# Patient Record
Sex: Female | Born: 1980 | Race: White | Hispanic: No | Smoking: Former smoker
Health system: Southern US, Community
[De-identification: ages and names within clinical notes are randomized; demographics above are authoritative.]

## PROBLEM LIST (undated history)

## (undated) DIAGNOSIS — M25559 Pain in unspecified hip: Secondary | ICD-10-CM

## (undated) DIAGNOSIS — M7070 Other bursitis of hip, unspecified hip: Secondary | ICD-10-CM

## (undated) DIAGNOSIS — F419 Anxiety disorder, unspecified: Secondary | ICD-10-CM

## (undated) DIAGNOSIS — F319 Bipolar disorder, unspecified: Secondary | ICD-10-CM

## (undated) DIAGNOSIS — R579 Shock, unspecified: Secondary | ICD-10-CM

## (undated) DIAGNOSIS — R569 Unspecified convulsions: Secondary | ICD-10-CM

## (undated) HISTORY — PX: KNEE ARTHROSCOPY: SUR90

---

## 2002-01-09 ENCOUNTER — Emergency Department (HOSPITAL_COMMUNITY): Admission: EM | Admit: 2002-01-09 | Discharge: 2002-01-09 | Payer: Self-pay | Admitting: Emergency Medicine

## 2002-01-12 ENCOUNTER — Inpatient Hospital Stay (HOSPITAL_COMMUNITY): Admission: AD | Admit: 2002-01-12 | Discharge: 2002-01-12 | Payer: Self-pay | Admitting: *Deleted

## 2003-03-27 ENCOUNTER — Emergency Department (HOSPITAL_COMMUNITY): Admission: EM | Admit: 2003-03-27 | Discharge: 2003-03-28 | Payer: Self-pay | Admitting: *Deleted

## 2003-08-27 ENCOUNTER — Emergency Department (HOSPITAL_COMMUNITY): Admission: EM | Admit: 2003-08-27 | Discharge: 2003-08-27 | Payer: Self-pay | Admitting: *Deleted

## 2003-09-05 ENCOUNTER — Emergency Department (HOSPITAL_COMMUNITY): Admission: EM | Admit: 2003-09-05 | Discharge: 2003-09-05 | Payer: Self-pay | Admitting: Emergency Medicine

## 2003-10-19 ENCOUNTER — Emergency Department (HOSPITAL_COMMUNITY): Admission: EM | Admit: 2003-10-19 | Discharge: 2003-10-20 | Payer: Self-pay | Admitting: Emergency Medicine

## 2003-10-24 ENCOUNTER — Emergency Department (HOSPITAL_COMMUNITY): Admission: EM | Admit: 2003-10-24 | Discharge: 2003-10-24 | Payer: Self-pay | Admitting: Family Medicine

## 2003-10-31 ENCOUNTER — Emergency Department (HOSPITAL_COMMUNITY): Admission: EM | Admit: 2003-10-31 | Discharge: 2003-10-31 | Payer: Self-pay | Admitting: Family Medicine

## 2003-11-02 ENCOUNTER — Emergency Department (HOSPITAL_COMMUNITY): Admission: EM | Admit: 2003-11-02 | Discharge: 2003-11-02 | Payer: Self-pay | Admitting: Family Medicine

## 2004-01-20 ENCOUNTER — Emergency Department (HOSPITAL_COMMUNITY): Admission: EM | Admit: 2004-01-20 | Discharge: 2004-01-20 | Payer: Self-pay | Admitting: Family Medicine

## 2004-02-04 ENCOUNTER — Encounter (HOSPITAL_COMMUNITY): Admission: RE | Admit: 2004-02-04 | Discharge: 2004-05-04 | Payer: Self-pay | Admitting: Internal Medicine

## 2004-04-23 ENCOUNTER — Ambulatory Visit (HOSPITAL_COMMUNITY): Admission: RE | Admit: 2004-04-23 | Discharge: 2004-04-23 | Payer: Self-pay | Admitting: Emergency Medicine

## 2004-04-23 ENCOUNTER — Emergency Department (HOSPITAL_COMMUNITY): Admission: EM | Admit: 2004-04-23 | Discharge: 2004-04-23 | Payer: Self-pay | Admitting: Emergency Medicine

## 2004-08-14 ENCOUNTER — Emergency Department (HOSPITAL_COMMUNITY): Admission: EM | Admit: 2004-08-14 | Discharge: 2004-08-14 | Payer: Self-pay | Admitting: Family Medicine

## 2004-09-22 ENCOUNTER — Emergency Department (HOSPITAL_COMMUNITY): Admission: EM | Admit: 2004-09-22 | Discharge: 2004-09-23 | Payer: Self-pay | Admitting: Emergency Medicine

## 2004-09-28 ENCOUNTER — Emergency Department (HOSPITAL_COMMUNITY): Admission: EM | Admit: 2004-09-28 | Discharge: 2004-09-29 | Payer: Self-pay | Admitting: Emergency Medicine

## 2005-12-29 ENCOUNTER — Emergency Department (HOSPITAL_COMMUNITY): Admission: EM | Admit: 2005-12-29 | Discharge: 2005-12-29 | Payer: Self-pay | Admitting: Family Medicine

## 2006-01-15 ENCOUNTER — Inpatient Hospital Stay (HOSPITAL_COMMUNITY): Admission: AD | Admit: 2006-01-15 | Discharge: 2006-01-15 | Payer: Self-pay | Admitting: Gynecology

## 2008-01-31 ENCOUNTER — Emergency Department (HOSPITAL_COMMUNITY): Admission: EM | Admit: 2008-01-31 | Discharge: 2008-01-31 | Payer: Self-pay | Admitting: Emergency Medicine

## 2008-05-22 ENCOUNTER — Emergency Department (HOSPITAL_BASED_OUTPATIENT_CLINIC_OR_DEPARTMENT_OTHER): Admission: EM | Admit: 2008-05-22 | Discharge: 2008-05-23 | Payer: Self-pay | Admitting: Emergency Medicine

## 2008-06-05 ENCOUNTER — Emergency Department (HOSPITAL_BASED_OUTPATIENT_CLINIC_OR_DEPARTMENT_OTHER): Admission: EM | Admit: 2008-06-05 | Discharge: 2008-06-05 | Payer: Self-pay | Admitting: Emergency Medicine

## 2008-07-27 HISTORY — PX: MOUTH SURGERY: SHX715

## 2008-08-27 ENCOUNTER — Emergency Department (HOSPITAL_BASED_OUTPATIENT_CLINIC_OR_DEPARTMENT_OTHER): Admission: EM | Admit: 2008-08-27 | Discharge: 2008-08-28 | Payer: Self-pay | Admitting: Emergency Medicine

## 2008-09-05 ENCOUNTER — Emergency Department (HOSPITAL_BASED_OUTPATIENT_CLINIC_OR_DEPARTMENT_OTHER): Admission: EM | Admit: 2008-09-05 | Discharge: 2008-09-05 | Payer: Self-pay | Admitting: Emergency Medicine

## 2008-09-05 ENCOUNTER — Ambulatory Visit: Payer: Self-pay | Admitting: Diagnostic Radiology

## 2008-10-01 ENCOUNTER — Emergency Department (HOSPITAL_BASED_OUTPATIENT_CLINIC_OR_DEPARTMENT_OTHER): Admission: EM | Admit: 2008-10-01 | Discharge: 2008-10-01 | Payer: Self-pay | Admitting: Emergency Medicine

## 2008-10-13 ENCOUNTER — Emergency Department (HOSPITAL_BASED_OUTPATIENT_CLINIC_OR_DEPARTMENT_OTHER): Admission: EM | Admit: 2008-10-13 | Discharge: 2008-10-13 | Payer: Self-pay | Admitting: Emergency Medicine

## 2008-10-29 ENCOUNTER — Emergency Department (HOSPITAL_BASED_OUTPATIENT_CLINIC_OR_DEPARTMENT_OTHER): Admission: EM | Admit: 2008-10-29 | Discharge: 2008-10-30 | Payer: Self-pay | Admitting: Emergency Medicine

## 2008-11-11 ENCOUNTER — Emergency Department (HOSPITAL_COMMUNITY): Admission: EM | Admit: 2008-11-11 | Discharge: 2008-11-11 | Payer: Self-pay | Admitting: Emergency Medicine

## 2008-12-31 ENCOUNTER — Encounter: Admission: RE | Admit: 2008-12-31 | Discharge: 2009-03-01 | Payer: Self-pay | Admitting: Orthopedic Surgery

## 2009-07-31 ENCOUNTER — Ambulatory Visit: Payer: Self-pay | Admitting: Interventional Radiology

## 2009-07-31 ENCOUNTER — Emergency Department (HOSPITAL_BASED_OUTPATIENT_CLINIC_OR_DEPARTMENT_OTHER): Admission: EM | Admit: 2009-07-31 | Discharge: 2009-07-31 | Payer: Self-pay | Admitting: Emergency Medicine

## 2009-09-09 ENCOUNTER — Emergency Department (HOSPITAL_BASED_OUTPATIENT_CLINIC_OR_DEPARTMENT_OTHER): Admission: EM | Admit: 2009-09-09 | Discharge: 2009-09-09 | Payer: Self-pay | Admitting: Emergency Medicine

## 2009-09-09 ENCOUNTER — Ambulatory Visit: Payer: Self-pay | Admitting: Diagnostic Radiology

## 2009-11-20 ENCOUNTER — Ambulatory Visit (HOSPITAL_BASED_OUTPATIENT_CLINIC_OR_DEPARTMENT_OTHER): Admission: RE | Admit: 2009-11-20 | Discharge: 2009-11-20 | Payer: Self-pay | Admitting: Orthopedic Surgery

## 2010-01-28 ENCOUNTER — Emergency Department (HOSPITAL_BASED_OUTPATIENT_CLINIC_OR_DEPARTMENT_OTHER): Admission: EM | Admit: 2010-01-28 | Discharge: 2010-01-28 | Payer: Self-pay | Admitting: Emergency Medicine

## 2010-05-29 ENCOUNTER — Emergency Department (HOSPITAL_BASED_OUTPATIENT_CLINIC_OR_DEPARTMENT_OTHER): Admission: EM | Admit: 2010-05-29 | Discharge: 2010-05-29 | Payer: Self-pay | Admitting: Emergency Medicine

## 2010-05-29 ENCOUNTER — Ambulatory Visit: Payer: Self-pay | Admitting: Diagnostic Radiology

## 2010-07-05 ENCOUNTER — Emergency Department (HOSPITAL_BASED_OUTPATIENT_CLINIC_OR_DEPARTMENT_OTHER)
Admission: EM | Admit: 2010-07-05 | Discharge: 2010-07-05 | Payer: Self-pay | Source: Home / Self Care | Admitting: Emergency Medicine

## 2010-07-23 ENCOUNTER — Emergency Department (HOSPITAL_BASED_OUTPATIENT_CLINIC_OR_DEPARTMENT_OTHER)
Admission: EM | Admit: 2010-07-23 | Discharge: 2010-07-23 | Payer: Self-pay | Source: Home / Self Care | Admitting: Emergency Medicine

## 2010-07-25 ENCOUNTER — Emergency Department (HOSPITAL_BASED_OUTPATIENT_CLINIC_OR_DEPARTMENT_OTHER)
Admission: EM | Admit: 2010-07-25 | Discharge: 2010-07-26 | Payer: Self-pay | Source: Home / Self Care | Admitting: Emergency Medicine

## 2010-08-04 ENCOUNTER — Emergency Department (HOSPITAL_BASED_OUTPATIENT_CLINIC_OR_DEPARTMENT_OTHER)
Admission: EM | Admit: 2010-08-04 | Discharge: 2010-08-04 | Payer: Self-pay | Source: Home / Self Care | Admitting: Emergency Medicine

## 2010-08-15 ENCOUNTER — Emergency Department (HOSPITAL_BASED_OUTPATIENT_CLINIC_OR_DEPARTMENT_OTHER)
Admission: EM | Admit: 2010-08-15 | Discharge: 2010-08-15 | Payer: Self-pay | Source: Home / Self Care | Admitting: Emergency Medicine

## 2010-08-17 ENCOUNTER — Encounter: Payer: Self-pay | Admitting: Internal Medicine

## 2010-08-18 LAB — RAPID STREP SCREEN (MED CTR MEBANE ONLY): Streptococcus, Group A Screen (Direct): NEGATIVE

## 2010-08-20 ENCOUNTER — Emergency Department (HOSPITAL_BASED_OUTPATIENT_CLINIC_OR_DEPARTMENT_OTHER)
Admission: EM | Admit: 2010-08-20 | Discharge: 2010-08-20 | Payer: Self-pay | Source: Home / Self Care | Admitting: Emergency Medicine

## 2010-08-26 ENCOUNTER — Emergency Department (INDEPENDENT_AMBULATORY_CARE_PROVIDER_SITE_OTHER)
Admission: EM | Admit: 2010-08-26 | Discharge: 2010-08-26 | Payer: Self-pay | Source: Home / Self Care | Admitting: Emergency Medicine

## 2010-08-26 DIAGNOSIS — S99929A Unspecified injury of unspecified foot, initial encounter: Secondary | ICD-10-CM

## 2010-08-26 DIAGNOSIS — X58XXXA Exposure to other specified factors, initial encounter: Secondary | ICD-10-CM

## 2010-08-26 DIAGNOSIS — M79609 Pain in unspecified limb: Secondary | ICD-10-CM

## 2010-09-09 ENCOUNTER — Emergency Department (INDEPENDENT_AMBULATORY_CARE_PROVIDER_SITE_OTHER): Payer: Medicaid Other

## 2010-09-09 ENCOUNTER — Emergency Department (HOSPITAL_BASED_OUTPATIENT_CLINIC_OR_DEPARTMENT_OTHER)
Admission: EM | Admit: 2010-09-09 | Discharge: 2010-09-09 | Disposition: A | Discharge status: Home / Self Care | Payer: Medicaid Other | Attending: Emergency Medicine | Admitting: Emergency Medicine

## 2010-09-09 DIAGNOSIS — G8929 Other chronic pain: Secondary | ICD-10-CM | POA: Insufficient documentation

## 2010-09-09 DIAGNOSIS — Z79899 Other long term (current) drug therapy: Secondary | ICD-10-CM | POA: Insufficient documentation

## 2010-09-09 DIAGNOSIS — M25579 Pain in unspecified ankle and joints of unspecified foot: Secondary | ICD-10-CM | POA: Insufficient documentation

## 2010-09-09 DIAGNOSIS — W19XXXA Unspecified fall, initial encounter: Secondary | ICD-10-CM

## 2010-09-09 DIAGNOSIS — X500XXA Overexertion from strenuous movement or load, initial encounter: Secondary | ICD-10-CM | POA: Insufficient documentation

## 2010-10-07 LAB — DIFFERENTIAL
Lymphs Abs: 1.7 10*3/uL (ref 0.7–4.0)
Monocytes Relative: 7 % (ref 3–12)
Neutro Abs: 3.8 10*3/uL (ref 1.7–7.7)
Neutrophils Relative %: 63 % (ref 43–77)

## 2010-10-07 LAB — URINALYSIS, ROUTINE W REFLEX MICROSCOPIC
Bilirubin Urine: NEGATIVE
Bilirubin Urine: NEGATIVE
Ketones, ur: NEGATIVE mg/dL
Ketones, ur: NEGATIVE mg/dL
Nitrite: NEGATIVE
Nitrite: NEGATIVE
Protein, ur: NEGATIVE mg/dL
Specific Gravity, Urine: 1.008 (ref 1.005–1.030)
Urobilinogen, UA: 0.2 mg/dL (ref 0.0–1.0)
Urobilinogen, UA: 0.2 mg/dL (ref 0.0–1.0)

## 2010-10-07 LAB — URINE MICROSCOPIC-ADD ON

## 2010-10-07 LAB — COMPREHENSIVE METABOLIC PANEL
ALT: 15 U/L (ref 0–35)
Albumin: 4 g/dL (ref 3.5–5.2)
BUN: 9 mg/dL (ref 6–23)
Calcium: 9.2 mg/dL (ref 8.4–10.5)
GFR calc Af Amer: >60 mL/min (ref >60)
Glucose, Bld: 83 mg/dL (ref 70–99)
Potassium: 3.9 mEq/L (ref 3.5–5.1)
Total Protein: 6.5 g/dL (ref 6.0–8.3)

## 2010-10-07 LAB — PREGNANCY, URINE: Preg Test, Ur: NEGATIVE

## 2010-10-07 LAB — CBC
Hemoglobin: 9.4 g/dL — ABNORMAL LOW (ref 12.0–15.0)
MCH: 24.8 pg — ABNORMAL LOW (ref 26.0–34.0)
MCV: 76 fL — ABNORMAL LOW (ref 78.0–100.0)
RBC: 3.8 MIL/uL — ABNORMAL LOW (ref 3.87–5.11)
WBC: 6.1 10*3/uL (ref 4.0–10.5)

## 2010-10-12 LAB — URINALYSIS, ROUTINE W REFLEX MICROSCOPIC
Glucose, UA: NEGATIVE mg/dL
pH: 5 (ref 5.0–8.0)

## 2010-10-12 LAB — URINE MICROSCOPIC-ADD ON

## 2010-10-14 LAB — POCT HEMOGLOBIN-HEMACUE: Hemoglobin: 10.2 g/dL — ABNORMAL LOW (ref 12.0–15.0)

## 2010-10-17 ENCOUNTER — Emergency Department (HOSPITAL_COMMUNITY)
Admission: EM | Admit: 2010-10-17 | Discharge: 2010-10-18 | Disposition: A | Discharge status: Home / Self Care | Payer: Medicaid Other | Attending: Emergency Medicine | Admitting: Emergency Medicine

## 2010-10-17 ENCOUNTER — Emergency Department (HOSPITAL_COMMUNITY): Payer: Medicaid Other

## 2010-10-17 DIAGNOSIS — Y92009 Unspecified place in unspecified non-institutional (private) residence as the place of occurrence of the external cause: Secondary | ICD-10-CM | POA: Insufficient documentation

## 2010-10-17 DIAGNOSIS — S93409A Sprain of unspecified ligament of unspecified ankle, initial encounter: Secondary | ICD-10-CM | POA: Insufficient documentation

## 2010-10-17 DIAGNOSIS — W07XXXA Fall from chair, initial encounter: Secondary | ICD-10-CM | POA: Insufficient documentation

## 2010-10-17 DIAGNOSIS — G8929 Other chronic pain: Secondary | ICD-10-CM | POA: Insufficient documentation

## 2010-10-21 ENCOUNTER — Emergency Department (INDEPENDENT_AMBULATORY_CARE_PROVIDER_SITE_OTHER): Payer: Medicaid Other

## 2010-10-21 ENCOUNTER — Emergency Department (HOSPITAL_BASED_OUTPATIENT_CLINIC_OR_DEPARTMENT_OTHER)
Admission: EM | Admit: 2010-10-21 | Discharge: 2010-10-21 | Disposition: A | Discharge status: Home / Self Care | Payer: Medicaid Other | Attending: Emergency Medicine | Admitting: Emergency Medicine

## 2010-10-21 DIAGNOSIS — W1789XA Other fall from one level to another, initial encounter: Secondary | ICD-10-CM | POA: Insufficient documentation

## 2010-10-21 DIAGNOSIS — S7000XA Contusion of unspecified hip, initial encounter: Secondary | ICD-10-CM | POA: Insufficient documentation

## 2010-10-21 DIAGNOSIS — G8929 Other chronic pain: Secondary | ICD-10-CM | POA: Insufficient documentation

## 2010-10-21 DIAGNOSIS — M25559 Pain in unspecified hip: Secondary | ICD-10-CM

## 2010-10-21 DIAGNOSIS — Y92009 Unspecified place in unspecified non-institutional (private) residence as the place of occurrence of the external cause: Secondary | ICD-10-CM | POA: Insufficient documentation

## 2010-10-21 DIAGNOSIS — Z79899 Other long term (current) drug therapy: Secondary | ICD-10-CM | POA: Insufficient documentation

## 2010-11-12 ENCOUNTER — Emergency Department (HOSPITAL_BASED_OUTPATIENT_CLINIC_OR_DEPARTMENT_OTHER)
Admission: EM | Admit: 2010-11-12 | Discharge: 2010-11-12 | Disposition: A | Discharge status: Home / Self Care | Payer: Medicaid Other | Attending: Emergency Medicine | Admitting: Emergency Medicine

## 2010-11-12 DIAGNOSIS — G8929 Other chronic pain: Secondary | ICD-10-CM | POA: Insufficient documentation

## 2010-11-12 DIAGNOSIS — M545 Low back pain, unspecified: Secondary | ICD-10-CM | POA: Insufficient documentation

## 2010-11-12 DIAGNOSIS — F17200 Nicotine dependence, unspecified, uncomplicated: Secondary | ICD-10-CM | POA: Insufficient documentation

## 2010-11-12 DIAGNOSIS — Z79899 Other long term (current) drug therapy: Secondary | ICD-10-CM | POA: Insufficient documentation

## 2010-11-12 LAB — URINALYSIS, ROUTINE W REFLEX MICROSCOPIC
Bilirubin Urine: NEGATIVE
Glucose, UA: NEGATIVE mg/dL
Hgb urine dipstick: NEGATIVE
Ketones, ur: NEGATIVE mg/dL
pH: 6 (ref 5.0–8.0)

## 2010-11-12 LAB — URINE MICROSCOPIC-ADD ON

## 2010-12-31 ENCOUNTER — Emergency Department (INDEPENDENT_AMBULATORY_CARE_PROVIDER_SITE_OTHER): Payer: Medicaid Other

## 2010-12-31 ENCOUNTER — Emergency Department (HOSPITAL_BASED_OUTPATIENT_CLINIC_OR_DEPARTMENT_OTHER)
Admission: EM | Admit: 2010-12-31 | Discharge: 2010-12-31 | Disposition: A | Discharge status: Home / Self Care | Payer: Medicaid Other | Attending: Emergency Medicine | Admitting: Emergency Medicine

## 2010-12-31 DIAGNOSIS — S0990XA Unspecified injury of head, initial encounter: Secondary | ICD-10-CM

## 2010-12-31 DIAGNOSIS — F341 Dysthymic disorder: Secondary | ICD-10-CM | POA: Insufficient documentation

## 2010-12-31 DIAGNOSIS — W19XXXA Unspecified fall, initial encounter: Secondary | ICD-10-CM

## 2010-12-31 DIAGNOSIS — R55 Syncope and collapse: Secondary | ICD-10-CM

## 2010-12-31 DIAGNOSIS — M549 Dorsalgia, unspecified: Secondary | ICD-10-CM | POA: Insufficient documentation

## 2010-12-31 DIAGNOSIS — Y92009 Unspecified place in unspecified non-institutional (private) residence as the place of occurrence of the external cause: Secondary | ICD-10-CM | POA: Insufficient documentation

## 2010-12-31 DIAGNOSIS — R079 Chest pain, unspecified: Secondary | ICD-10-CM | POA: Insufficient documentation

## 2010-12-31 DIAGNOSIS — N390 Urinary tract infection, site not specified: Secondary | ICD-10-CM | POA: Insufficient documentation

## 2010-12-31 DIAGNOSIS — R0789 Other chest pain: Secondary | ICD-10-CM

## 2010-12-31 DIAGNOSIS — G8929 Other chronic pain: Secondary | ICD-10-CM | POA: Insufficient documentation

## 2010-12-31 DIAGNOSIS — Z79899 Other long term (current) drug therapy: Secondary | ICD-10-CM | POA: Insufficient documentation

## 2010-12-31 DIAGNOSIS — S2239XA Fracture of one rib, unspecified side, initial encounter for closed fracture: Secondary | ICD-10-CM | POA: Insufficient documentation

## 2010-12-31 LAB — URINE MICROSCOPIC-ADD ON

## 2010-12-31 LAB — URINALYSIS, ROUTINE W REFLEX MICROSCOPIC
Ketones, ur: NEGATIVE mg/dL
Nitrite: NEGATIVE
Specific Gravity, Urine: 1.021 (ref 1.005–1.030)
pH: 5.5 (ref 5.0–8.0)

## 2011-02-21 ENCOUNTER — Encounter: Payer: Self-pay | Admitting: *Deleted

## 2011-02-21 ENCOUNTER — Emergency Department (INDEPENDENT_AMBULATORY_CARE_PROVIDER_SITE_OTHER): Payer: Medicaid Other

## 2011-02-21 ENCOUNTER — Emergency Department (HOSPITAL_BASED_OUTPATIENT_CLINIC_OR_DEPARTMENT_OTHER)
Admission: EM | Admit: 2011-02-21 | Discharge: 2011-02-22 | Disposition: A | Discharge status: Home / Self Care | Payer: Medicaid Other | Attending: Emergency Medicine | Admitting: Emergency Medicine

## 2011-02-21 DIAGNOSIS — R51 Headache: Secondary | ICD-10-CM | POA: Insufficient documentation

## 2011-02-21 DIAGNOSIS — M25572 Pain in left ankle and joints of left foot: Secondary | ICD-10-CM

## 2011-02-21 DIAGNOSIS — M545 Low back pain, unspecified: Secondary | ICD-10-CM | POA: Insufficient documentation

## 2011-02-21 DIAGNOSIS — W19XXXA Unspecified fall, initial encounter: Secondary | ICD-10-CM

## 2011-02-21 DIAGNOSIS — M7070 Other bursitis of hip, unspecified hip: Secondary | ICD-10-CM | POA: Insufficient documentation

## 2011-02-21 DIAGNOSIS — M25559 Pain in unspecified hip: Secondary | ICD-10-CM

## 2011-02-21 DIAGNOSIS — S8392XA Sprain of unspecified site of left knee, initial encounter: Secondary | ICD-10-CM

## 2011-02-21 DIAGNOSIS — IMO0002 Reserved for concepts with insufficient information to code with codable children: Secondary | ICD-10-CM | POA: Insufficient documentation

## 2011-02-21 DIAGNOSIS — F17200 Nicotine dependence, unspecified, uncomplicated: Secondary | ICD-10-CM | POA: Insufficient documentation

## 2011-02-21 DIAGNOSIS — M25579 Pain in unspecified ankle and joints of unspecified foot: Secondary | ICD-10-CM

## 2011-02-21 DIAGNOSIS — W010XXA Fall on same level from slipping, tripping and stumbling without subsequent striking against object, initial encounter: Secondary | ICD-10-CM | POA: Insufficient documentation

## 2011-02-21 DIAGNOSIS — M25569 Pain in unspecified knee: Secondary | ICD-10-CM

## 2011-02-21 HISTORY — DX: Other bursitis of hip, unspecified hip: M70.70

## 2011-02-21 MED ORDER — ONDANSETRON 8 MG PO TBDP
8.0000 mg | ORAL_TABLET | Freq: Once | ORAL | Status: AC
  Administered 2011-02-21: 8 mg via ORAL
  Filled 2011-02-21: qty 1

## 2011-02-21 MED ORDER — HYDROMORPHONE HCL 2 MG/ML IJ SOLN
2.0000 mg | Freq: Once | INTRAMUSCULAR | Status: AC
  Administered 2011-02-21: 2 mg via INTRAVENOUS
  Filled 2011-02-21: qty 1

## 2011-02-21 NOTE — ED Notes (Signed)
Pt states that she twisted her  Left knee 2 days ago fell again today presents with left foot knee hip lower back  and right face pain no obvious injuries or deformities noted pt states that she currently is being seen by a pain management clinic for bursitis in her hips

## 2011-02-21 NOTE — ED Provider Notes (Signed)
History     Chief Complaint  Patient presents with  . Knee Pain   Patient is a 30 y.o. female presenting with knee pain. The history is provided by the patient.  Knee Pain This is a new problem. The current episode started more than 2 days ago (She twisted her left knee three days ago and fell. She was doing ok until today when she tripped and fell on her face. She is now complaining of pain in her face, lower back, left hip, and left ankle as well as the left knee.). The problem occurs constantly. The problem has not changed (She states the pain is severe, rates it as 10/10.) since onset.Exacerbated by: Pain is worse with movement and palpation, not improved with anything. She has Percocet 10 mg from a pain managemetn clinic, which has not helped.    Past Medical History  Diagnosis Date  . Bursitis of hip     Past Surgical History  Procedure Date  . Knee arthroscopy     History reviewed. No pertinent family history.  History  Substance Use Topics  . Smoking status: Current Everyday Smoker -- 0.5 packs/day    Types: Cigarettes  . Smokeless tobacco: Never Used  . Alcohol Use: No    OB History    Grav Para Term Preterm Abortions TAB SAB Ect Mult Living                  Review of Systems  All other systems reviewed and are negative.    Physical Exam  BP 122/75  Pulse 76  Temp(Src) 98 F (36.7 C) (Oral)  Resp 16  Ht 5\' 6"  (1.676 m)  Wt 100 lb (45.36 kg)  BMI 16.14 kg/m2  SpO2 99%  Physical Exam  Nursing note and vitals reviewed. Constitutional: She is oriented to person, place, and time. She appears well-developed and well-nourished.       She is tearful, but not in distress.  HENT:  Head: Normocephalic and atraumatic.  Right Ear: External ear normal.  Left Ear: External ear normal.  Nose: Nose normal.  Mouth/Throat: Oropharynx is clear and moist.       Face is diffusely tender, but no swelling or bruising seen.  Eyes: Conjunctivae and EOM are normal.  Pupils are equal, round, and reactive to light. No scleral icterus.  Neck: No JVD present.       Neck is tender posteriorly.  Cardiovascular: Normal rate, regular rhythm and normal heart sounds.   No murmur heard. Pulmonary/Chest: Effort normal and breath sounds normal. She has no wheezes. She has no rales.  Abdominal: Soft. Bowel sounds are normal. She exhibits no mass. There is no tenderness.  Musculoskeletal: Normal range of motion. She exhibits no edema.       There is no swelling, deformity or ecchymosis anywhere. There is moderate tenderness of the lumbar spine, left hip, left knee and left ankle. No effusion present, no laxity. She complains of pain on ROM of the left ankle, knee and hip, but full passive ROM is present.  Lymphadenopathy:    She has no cervical adenopathy.  Neurological: She is alert and oriented to person, place, and time. She has normal reflexes. No cranial nerve deficit. Coordination normal.  Skin: Skin is warm and dry. No rash noted.  Psychiatric:       She is emotionally labile and tearful.    ED Course  Procedures Patient reminded that she cannot receive narcotic prescriptions without voiding her pain management  contract. She is given an injection of Dilaudid.  MDM Complaints of pain everywhere with no physical findings of trauma.  She is given an injection of Dilaudid. Following that, she complained of a headache so she was given Acetaminophen. She is placed in a knee immobilizer for comfort.  X-rays are reviewed, images viewed by me. Results for orders placed during the hospital encounter of 12/31/10  URINALYSIS, ROUTINE W REFLEX MICROSCOPIC      Component Value Range   Color, Urine YELLOW  YELLOW    Appearance CLEAR  CLEAR    Specific Gravity, Urine 1.021  1.005 - 1.030    pH 5.5  5.0 - 8.0    Glucose, UA NEGATIVE  NEGATIVE (mg/dL)   Hgb urine dipstick NEGATIVE  NEGATIVE    Bilirubin Urine NEGATIVE  NEGATIVE    Ketones, ur NEGATIVE  NEGATIVE  (mg/dL)   Protein, ur NEGATIVE  NEGATIVE (mg/dL)   Urobilinogen, UA 0.2  0.0 - 1.0 (mg/dL)   Nitrite NEGATIVE  NEGATIVE    Leukocytes, UA SMALL (*) NEGATIVE   URINE MICROSCOPIC-ADD ON      Component Value Range   Squamous Epithelial / LPF FEW (*) RARE    WBC, UA 7-10  <3 (WBC/hpf)   Bacteria, UA FEW (*) RARE   PREGNANCY, URINE      Component Value Range   Preg Test, Ur       Value: NEGATIVE            THE SENSITIVITY OF THIS     METHODOLOGY IS >24 mIU/mL   Dg Facial Bones Complete  02/22/2011  *RADIOLOGY REPORT*  Clinical Data: Fall.  Face pain.  FACIAL BONES COMPLETE 3+V  Comparison: None.  Findings: No evidence for an acute facial bone fracture. Specifically, the medial and inferior orbital walls appear intact. No evidence for air-fluid levels in the frontal or maxillary sinuses.  The mandible is intact.  IMPRESSION: No evidence for acute facial bone fracture.  Original Report Authenticated By: ERIC A. MANSELL, M.D.   Dg Cervical Spine Complete  02/22/2011  *RADIOLOGY REPORT*  Clinical Data: Fall.  Neck pain.  CERVICAL SPINE - COMPLETE 4+ VIEW  Comparison: 12/31/2010  Findings: Strain of the normal cervical lordosis is evident.  There is no fracture.  No evidence for subluxation.  Intervertebral disc spaces are preserved throughout.  The facets are well-aligned bilaterally.  There is no prevertebral soft tissue swelling.  IMPRESSION: Stable.  Normal exam.  Original Report Authenticated By: ERIC A. MANSELL, M.D.   Dg Lumbar Spine Complete  02/22/2011  *RADIOLOGY REPORT*  Clinical Data: Fall.  Low back pain.  LUMBAR SPINE - COMPLETE 4+ VIEW  Comparison: 07/25/2010  Findings: No evidence for fracture.  No subluxation. Intervertebral disc spaces are preserved throughout.  The facets are well-aligned bilaterally. SI joints are normal.  IMPRESSION: Stable.  Normal exam.  Original Report Authenticated By: ERIC A. MANSELL, M.D.   Dg Pelvis 1-2 Views  02/22/2011  *RADIOLOGY REPORT*  Clinical  Data: Fall.  Low back and left hip pain.  PELVIS - 1-2 VIEW  Comparison: 10/21/2010  Findings: Frontal pelvis shows no fracture.  No worrisome lytic or sclerotic osseous abnormality.  SI joints and symphysis pubis are normal.  Joint space in the hips is symmetric well preserved.  IMPRESSION: Stable.  Normal exam.  Original Report Authenticated By: ERIC A. MANSELL, M.D.   Dg Ankle Complete Left  02/22/2011  *RADIOLOGY REPORT*  Clinical Data: Trauma.  Left foot ankle pain.  LEFT ANKLE COMPLETE - 3+ VIEW  Comparison: None.  Findings: There is no evidence for fracture, subluxation or dislocation.  No worrisome lytic or sclerotic osseous lesion.  IMPRESSION: Normal exam.  Original Report Authenticated By: ERIC A. MANSELL, M.D.   Dg Knee Complete 4 Views Left  02/22/2011  *RADIOLOGY REPORT*  Clinical Data: Fall.  Left knee pain.  LEFT KNEE - COMPLETE 4+ VIEW  Comparison: 08/15/2010  Findings: No evidence for fracture or dislocation.  There is no joint effusion.  No worrisome lytic or sclerotic osseous lesion.  IMPRESSION: Normal exam.  No interval change.  Original Report Authenticated By: ERIC A. MANSELL, M.D.      Dione Booze, MD 02/22/11 [phone]

## 2011-02-22 ENCOUNTER — Emergency Department (INDEPENDENT_AMBULATORY_CARE_PROVIDER_SITE_OTHER): Payer: Medicaid Other

## 2011-02-22 DIAGNOSIS — M542 Cervicalgia: Secondary | ICD-10-CM

## 2011-02-22 DIAGNOSIS — W19XXXA Unspecified fall, initial encounter: Secondary | ICD-10-CM

## 2011-02-22 DIAGNOSIS — R51 Headache: Secondary | ICD-10-CM

## 2011-02-22 MED ORDER — OXYCODONE-ACETAMINOPHEN 5-325 MG PO TABS
1.0000 | ORAL_TABLET | Freq: Once | ORAL | Status: AC
  Administered 2011-02-22: 1 via ORAL
  Filled 2011-02-22: qty 1

## 2011-02-22 MED ORDER — ACETAMINOPHEN 325 MG PO TABS
650.0000 mg | ORAL_TABLET | Freq: Once | ORAL | Status: AC
  Administered 2011-02-22: 650 mg via ORAL
  Filled 2011-02-22: qty 2

## 2011-03-14 ENCOUNTER — Encounter (HOSPITAL_BASED_OUTPATIENT_CLINIC_OR_DEPARTMENT_OTHER): Payer: Self-pay | Admitting: *Deleted

## 2011-03-14 ENCOUNTER — Emergency Department (HOSPITAL_BASED_OUTPATIENT_CLINIC_OR_DEPARTMENT_OTHER)
Admission: EM | Admit: 2011-03-14 | Discharge: 2011-03-14 | Disposition: A | Discharge status: Home / Self Care | Payer: Medicaid Other | Attending: Emergency Medicine | Admitting: Emergency Medicine

## 2011-03-14 DIAGNOSIS — N760 Acute vaginitis: Secondary | ICD-10-CM | POA: Insufficient documentation

## 2011-03-14 DIAGNOSIS — A499 Bacterial infection, unspecified: Secondary | ICD-10-CM | POA: Insufficient documentation

## 2011-03-14 DIAGNOSIS — F17200 Nicotine dependence, unspecified, uncomplicated: Secondary | ICD-10-CM | POA: Insufficient documentation

## 2011-03-14 DIAGNOSIS — B9689 Other specified bacterial agents as the cause of diseases classified elsewhere: Secondary | ICD-10-CM | POA: Insufficient documentation

## 2011-03-14 DIAGNOSIS — N390 Urinary tract infection, site not specified: Secondary | ICD-10-CM | POA: Insufficient documentation

## 2011-03-14 HISTORY — DX: Pain in unspecified hip: M25.559

## 2011-03-14 HISTORY — DX: Unspecified convulsions: R56.9

## 2011-03-14 HISTORY — DX: Anxiety disorder, unspecified: F41.9

## 2011-03-14 LAB — URINALYSIS, ROUTINE W REFLEX MICROSCOPIC
Glucose, UA: NEGATIVE mg/dL
Protein, ur: NEGATIVE mg/dL
pH: 6 (ref 5.0–8.0)

## 2011-03-14 LAB — URINE MICROSCOPIC-ADD ON

## 2011-03-14 LAB — PREGNANCY, URINE: Preg Test, Ur: NEGATIVE

## 2011-03-14 LAB — WET PREP, GENITAL: Yeast Wet Prep HPF POC: NONE SEEN

## 2011-03-14 MED ORDER — OXYCODONE-ACETAMINOPHEN 5-325 MG PO TABS
1.0000 | ORAL_TABLET | Freq: Once | ORAL | Status: AC
  Administered 2011-03-14: 1 via ORAL
  Filled 2011-03-14: qty 1

## 2011-03-14 MED ORDER — NITROFURANTOIN MONOHYD MACRO 100 MG PO CAPS: 100.0000 mg | ORAL_CAPSULE | Freq: Two times a day (BID) | ORAL | Status: AC

## 2011-03-14 MED ORDER — METRONIDAZOLE 500 MG PO TABS: 500.0000 mg | ORAL_TABLET | Freq: Two times a day (BID) | ORAL | Status: AC

## 2011-03-14 NOTE — ED Provider Notes (Signed)
Medical screening examination/treatment/procedure(s) were performed by non-physician practitioner and as supervising physician I was immediately available for consultation/collaboration.   Vida Roller, MD 03/14/11 [phone]

## 2011-03-14 NOTE — ED Provider Notes (Signed)
History     CSN: [id number] Arrival date & time: 03/14/2011  3:12 PM  Chief Complaint  Patient presents with  . Vaginal Pain   Patient is a 30 y.o. female presenting with vaginal pain. The history is provided by the patient. No language interpreter was used.  Vaginal Pain This is a new problem. The current episode started in the past 7 days. The problem occurs constantly. The problem has been unchanged. Associated symptoms include abdominal pain. Pertinent negatives include no chest pain, fever, neck pain, numbness or urinary symptoms. The symptoms are aggravated by nothing. She has tried nothing for the symptoms. The treatment provided mild relief.    Past Medical History  Diagnosis Date  . Bursitis of hip   . Hip joint pain   . Seizures   . Anxiety     Past Surgical History  Procedure Date  . Knee arthroscopy   . Cesarean section     2002  . Mouth surgery 2010    Family History  Problem Relation Age of Onset  . Hypertension Mother   . Diabetes Other   . Cancer Other     History  Substance Use Topics  . Smoking status: Current Everyday Smoker -- 0.5 packs/day    Types: Cigarettes  . Smokeless tobacco: Never Used  . Alcohol Use: No    OB History    Grav Para Term Preterm Abortions TAB SAB Ect Mult Living   3 3              Review of Systems  Constitutional: Negative for fever.  HENT: Negative for neck pain.   Cardiovascular: Negative for chest pain.  Gastrointestinal: Positive for abdominal pain.  Genitourinary: Positive for vaginal discharge and vaginal pain.  Neurological: Negative for numbness.  All other systems reviewed and are negative.    Physical Exam  BP 117/89  Pulse 92  Temp(Src) 98.6 F (37 C) (Oral)  Resp 20  Ht 5\' 7"  (1.702 m)  Wt 105 lb (47.628 kg)  BMI 16.45 kg/m2  SpO2 100%  LMP 01/30/2011  Physical Exam  Nursing note and vitals reviewed. Constitutional: She is oriented to person, place, and time. She appears well-nourished.    HENT:  Head: Atraumatic.  Eyes: Pupils are equal, round, and reactive to light.  Neck: Normal range of motion.  Cardiovascular: Normal rate and regular rhythm.   Abdominal: Soft. Bowel sounds are normal.  Genitourinary: Cervix exhibits no motion tenderness. Vaginal discharge found.  Musculoskeletal: Normal range of motion.  Neurological: She is alert and oriented to person, place, and time.  Skin: Skin is warm and dry.  Psychiatric: She has a normal mood and affect.    ED Course  Procedures Results for orders placed during the hospital encounter of 03/14/11  URINALYSIS, ROUTINE W REFLEX MICROSCOPIC      Component Value Range   Color, Urine YELLOW  YELLOW    Appearance CLOUDY (*) CLEAR    Specific Gravity, Urine 1.015  1.005 - 1.030    pH 6.0  5.0 - 8.0    Glucose, UA NEGATIVE  NEGATIVE (mg/dL)   Hgb urine dipstick SMALL (*) NEGATIVE    Bilirubin Urine NEGATIVE  NEGATIVE    Ketones, ur NEGATIVE  NEGATIVE (mg/dL)   Protein, ur NEGATIVE  NEGATIVE (mg/dL)   Urobilinogen, UA 0.2  0.0 - 1.0 (mg/dL)   Nitrite NEGATIVE  NEGATIVE    Leukocytes, UA SMALL (*) NEGATIVE   PREGNANCY, URINE      Component  Value Range   Preg Test, Ur NEGATIVE    WET PREP, GENITAL      Component Value Range   Yeast, Wet Prep NONE SEEN  NONE SEEN    Trich, Wet Prep NONE SEEN  NONE SEEN    Clue Cells, Wet Prep MANY (*) NONE SEEN    WBC, Wet Prep HPF POC TOO NUMEROUS TO COUNT (*) NONE SEEN   URINE MICROSCOPIC-ADD ON      Component Value Range   Squamous Epithelial / LPF FEW (*) RARE    WBC, UA 7-10  <3 (WBC/hpf)   RBC / HPF 0-2  <3 (RBC/hpf)   Bacteria, UA MANY (*) RARE        MDM Will treat pt for bv and uti     Teressa Lower, NP 03/14/11 1628

## 2011-03-14 NOTE — ED Notes (Signed)
Patient states she has had pain in the right side of vaginal area for the last 3 days.  Denies any vaginal discharge or draining or urinary symptoms.

## 2011-03-23 ENCOUNTER — Encounter (HOSPITAL_BASED_OUTPATIENT_CLINIC_OR_DEPARTMENT_OTHER): Payer: Self-pay | Admitting: *Deleted

## 2011-03-23 ENCOUNTER — Emergency Department (HOSPITAL_BASED_OUTPATIENT_CLINIC_OR_DEPARTMENT_OTHER)
Admission: EM | Admit: 2011-03-23 | Discharge: 2011-03-23 | Disposition: A | Discharge status: Home / Self Care | Payer: Medicaid Other | Attending: Emergency Medicine | Admitting: Emergency Medicine

## 2011-03-23 DIAGNOSIS — L251 Unspecified contact dermatitis due to drugs in contact with skin: Secondary | ICD-10-CM | POA: Insufficient documentation

## 2011-03-23 MED ORDER — DIPHENHYDRAMINE HCL 25 MG PO CAPS
25.0000 mg | ORAL_CAPSULE | Freq: Four times a day (QID) | ORAL | Status: DC | PRN
  Administered 2011-03-23: 25 mg via ORAL
  Filled 2011-03-23 (×2): qty 1

## 2011-03-23 MED ORDER — PREDNISONE 10 MG PO TABS: 20.0000 mg | ORAL_TABLET | Freq: Every day | ORAL | Status: AC

## 2011-03-23 NOTE — ED Provider Notes (Signed)
Medical screening examination/treatment/procedure(s) were performed by non-physician practitioner and as supervising physician I was immediately available for consultation/collaboration.   Nickisha Hum B. Bernette Mayers, MD 03/23/11 2036

## 2011-03-23 NOTE — ED Notes (Signed)
Pt. Reports she was here on the 18th August 2012 and was given Macrobid and Flagyl.  Pt. Reports she quit taking th meds. 3 days ago.  Pt. Reports tongue swelling and blisters under the dentures she wears.  No noted edema in tongue upon assessment and no noted drooling or resp. Trouble.

## 2011-03-23 NOTE — ED Provider Notes (Signed)
History     CSN: [id number] Arrival date & time: 03/23/2011  8:15 PM  Chief Complaint  Patient presents with  . Rash   Patient is a 30 y.o. female presenting with rash. The history is provided by the patient. No language interpreter was used.  Rash  This is a new problem. The current episode started more than 2 days ago. The problem has not changed since onset.The problem is associated with new medications. There has been no fever. The rash is present on the face, trunk, right lower leg and left lower leg. The patient is experiencing no pain. The pain has been constant since onset. Associated symptoms include itching. Pertinent negatives include no pain and no weeping. She has tried antihistamines for the symptoms. The treatment provided mild relief.    Past Medical History  Diagnosis Date  . Bursitis of hip   . Hip joint pain   . Seizures   . Anxiety     Past Surgical History  Procedure Date  . Knee arthroscopy   . Cesarean section     2002  . Mouth surgery 2010    Family History  Problem Relation Age of Onset  . Hypertension Mother   . Diabetes Other   . Cancer Other     History  Substance Use Topics  . Smoking status: Current Everyday Smoker -- 0.5 packs/day    Types: Cigarettes  . Smokeless tobacco: Never Used  . Alcohol Use: No    OB History    Grav Para Term Preterm Abortions TAB SAB Ect Mult Living   3 3              Review of Systems  Constitutional: Negative.   Respiratory: Negative.   Cardiovascular: Negative.   Musculoskeletal: Negative.   Skin: Positive for itching and rash.    Physical Exam  BP 121/82  Pulse 84  Temp(Src) 99 F (37.2 C) (Oral)  Resp 20  Ht 5\' 7"  (1.702 m)  Wt 105 lb (47.628 kg)  BMI 16.45 kg/m2  SpO2 100%  LMP 03/16/2011  Physical Exam  Vitals reviewed. Constitutional: She appears well-developed and well-nourished.  HENT:       Pt has inflammation to mucus membranes without any ulceration to the area    Cardiovascular: Normal rate and regular rhythm.   Pulmonary/Chest: Effort normal and breath sounds normal.  Neurological: She is alert.  Skin:       Pt has red raised papules to the legs and trunk    ED Course  Procedures  MDM Pt likely having a rash from the antibiotics:pt is finished:will treat with steriods;pt not having any respiratory involvement at this time     Teressa Lower, NP 03/23/11 2033

## 2011-07-23 ENCOUNTER — Encounter (HOSPITAL_BASED_OUTPATIENT_CLINIC_OR_DEPARTMENT_OTHER): Payer: Self-pay

## 2011-07-23 DIAGNOSIS — J40 Bronchitis, not specified as acute or chronic: Secondary | ICD-10-CM | POA: Insufficient documentation

## 2011-07-23 DIAGNOSIS — Z79899 Other long term (current) drug therapy: Secondary | ICD-10-CM | POA: Insufficient documentation

## 2011-07-23 DIAGNOSIS — R0602 Shortness of breath: Secondary | ICD-10-CM | POA: Insufficient documentation

## 2011-07-23 DIAGNOSIS — F17200 Nicotine dependence, unspecified, uncomplicated: Secondary | ICD-10-CM | POA: Insufficient documentation

## 2011-07-23 NOTE — ED Notes (Signed)
Pt c/o cold SX with increased coughing, SOB.  Pt states she used mothers inhaler today for relief.

## 2011-07-24 ENCOUNTER — Emergency Department (INDEPENDENT_AMBULATORY_CARE_PROVIDER_SITE_OTHER): Payer: Medicaid Other

## 2011-07-24 ENCOUNTER — Emergency Department (HOSPITAL_BASED_OUTPATIENT_CLINIC_OR_DEPARTMENT_OTHER)
Admission: EM | Admit: 2011-07-24 | Discharge: 2011-07-24 | Disposition: A | Discharge status: Home / Self Care | Payer: Medicaid Other | Attending: Emergency Medicine | Admitting: Emergency Medicine

## 2011-07-24 DIAGNOSIS — R05 Cough: Secondary | ICD-10-CM

## 2011-07-24 DIAGNOSIS — R0789 Other chest pain: Secondary | ICD-10-CM

## 2011-07-24 DIAGNOSIS — J45909 Unspecified asthma, uncomplicated: Secondary | ICD-10-CM

## 2011-07-24 DIAGNOSIS — R0602 Shortness of breath: Secondary | ICD-10-CM

## 2011-07-24 DIAGNOSIS — J40 Bronchitis, not specified as acute or chronic: Secondary | ICD-10-CM

## 2011-07-24 LAB — PREGNANCY, URINE: Preg Test, Ur: NEGATIVE

## 2011-07-24 MED ORDER — ALBUTEROL SULFATE HFA 108 (90 BASE) MCG/ACT IN AERS
1.0000 | INHALATION_SPRAY | Freq: Four times a day (QID) | RESPIRATORY_TRACT | Status: DC | PRN
Start: 2011-07-24 — End: 2011-07-24
  Administered 2011-07-24: 2 via RESPIRATORY_TRACT
  Filled 2011-07-24: qty 6.7

## 2011-07-24 MED ORDER — ALBUTEROL SULFATE (5 MG/ML) 0.5% IN NEBU
2.5000 mg | INHALATION_SOLUTION | Freq: Once | RESPIRATORY_TRACT | Status: AC
  Administered 2011-07-24: 2.5 mg via RESPIRATORY_TRACT
  Filled 2011-07-24: qty 1

## 2011-07-24 MED ORDER — OXYCODONE-ACETAMINOPHEN 5-325 MG PO TABS
2.0000 | ORAL_TABLET | Freq: Once | ORAL | Status: AC
  Administered 2011-07-24: 2 via ORAL
  Filled 2011-07-24: qty 2

## 2011-07-24 MED ORDER — PREDNISONE 10 MG PO TABS: 20.0000 mg | ORAL_TABLET | Freq: Every day | ORAL | Status: DC

## 2011-07-24 MED ORDER — PREDNISONE 50 MG PO TABS
60.0000 mg | ORAL_TABLET | Freq: Once | ORAL | Status: AC
  Administered 2011-07-24: 60 mg via ORAL
  Filled 2011-07-24: qty 1

## 2011-07-24 NOTE — Patient Instructions (Signed)
Instructed pt on the proper use of administering albuteral mdi via aerochamber. Pt tolerated well 

## 2011-07-24 NOTE — ED Provider Notes (Signed)
History     CSN: [id number]  Arrival date & time 07/23/11  2244   First MD Initiated Contact with Patient 07/24/11 [phone]      Chief Complaint  Patient presents with  . URI    (Consider location/radiation/quality/duration/timing/severity/associated sxs/prior treatment) Patient is a 30 y.o. female presenting with cough.  Cough This is a new problem. The current episode started 2 days ago. The problem occurs constantly. The problem has been gradually worsening. The cough is productive of sputum. Maximum temperature: subjective fever. The fever has been present for 1 to 2 days. Associated symptoms include chest pain, chills, ear congestion, rhinorrhea, shortness of breath and wheezing. Pertinent negatives include no headaches. She is a smoker.    Past Medical History  Diagnosis Date  . Bursitis of hip   . Hip joint pain   . Seizures   . Anxiety     Past Surgical History  Procedure Date  . Knee arthroscopy   . Cesarean section     2002  . Mouth surgery 2010    Family History  Problem Relation Age of Onset  . Hypertension Mother   . Diabetes Other   . Cancer Other     History  Substance Use Topics  . Smoking status: Current Everyday Smoker -- 0.5 packs/day    Types: Cigarettes  . Smokeless tobacco: Never Used  . Alcohol Use: No    OB History    Grav Para Term Preterm Abortions TAB SAB Ect Mult Living   3 3              Review of Systems  Constitutional: Positive for chills. Negative for fever, diaphoresis and fatigue.  HENT: Positive for congestion and rhinorrhea. Negative for sneezing.   Eyes: Negative.   Respiratory: Positive for cough, shortness of breath and wheezing. Negative for chest tightness.   Cardiovascular: Positive for chest pain. Negative for leg swelling.  Gastrointestinal: Negative for nausea, vomiting, abdominal pain, diarrhea and blood in stool.  Genitourinary: Negative for frequency, hematuria, flank pain and difficulty urinating.    Musculoskeletal: Negative for back pain and arthralgias.  Skin: Negative for rash.  Neurological: Negative for dizziness, speech difficulty, weakness, numbness and headaches.    Allergies  Cucumber extract; Flagyl; Macrobid; Watermelon concentrate; Darvocet; Ketorolac; Ultracet; Ultram; and Vicodin  Home Medications   Current Outpatient Rx  Name Route Sig Dispense Refill  . PAROXETINE HCL 10 MG PO TABS Oral Take 10 mg by mouth every morning.      . BECLOMETHASONE DIPROPIONATE 80 MCG/ACT IN AERS Inhalation Inhale 2 puffs into the lungs as needed. Shortness of breath and wheezing     . CITALOPRAM HYDROBROMIDE 10 MG PO TABS Oral Take 10 mg by mouth daily.     Marland Kitchen CLONAZEPAM 1 MG PO TABS Oral Take 1 mg by mouth daily.     Marland Kitchen DIPHENHYDRAMINE HCL 25 MG PO TABS Oral Take 25 mg by mouth every 6 (six) hours as needed. For allergies     . NORETHINDRONE ACET-ETHINYL EST 1.5-30 MG-MCG PO TABS Oral Take 1 tablet by mouth daily.      . OXYCODONE-ACETAMINOPHEN 10-325 MG PO TABS Oral Take 1 tablet by mouth 3 (three) times daily.     Marland Kitchen PREDNISONE 10 MG PO TABS Oral Take 2 tablets (20 mg total) by mouth daily. 15 tablet 0  . ZOLPIDEM TARTRATE 10 MG PO TABS Oral Take 10 mg by mouth at bedtime.       BP 125/88  Pulse  102  Temp(Src) 98.4 F (36.9 C) (Oral)  Resp 16  Ht 5\' 6"  (1.676 m)  Wt 105 lb (47.628 kg)  BMI 16.95 kg/m2  SpO2 98%  LMP 07/23/2011  Physical Exam  Constitutional: She is oriented to person, place, and time. She appears well-developed and well-nourished.  HENT:  Head: Normocephalic and atraumatic.  Eyes: Pupils are equal, round, and reactive to light.  Neck: Normal range of motion. Neck supple.  Cardiovascular: Normal rate, regular rhythm and normal heart sounds.   Pulmonary/Chest: Effort normal. No respiratory distress. She has wheezes. She has no rales. She exhibits no tenderness.  Abdominal: Soft. Bowel sounds are normal. There is no tenderness. There is no rebound and no  guarding.  Musculoskeletal: Normal range of motion. She exhibits no edema.  Lymphadenopathy:    She has no cervical adenopathy.  Neurological: She is alert and oriented to person, place, and time.  Skin: Skin is warm and dry. No rash noted.  Psychiatric: She has a normal mood and affect.    ED Course  Procedures (including critical care time)  Results for orders placed during the hospital encounter of 07/24/11  PREGNANCY, URINE      Component Value Range   Preg Test, Ur NEGATIVE     Dg Chest 2 View  07/24/2011  *RADIOLOGY REPORT*  Clinical Data: Bilateral chest pressure and shortness of breath; history of asthma and smoking.  CHEST - 2 VIEW  Comparison: Chest radiograph performed 12/31/2010  Findings: The lungs are well-aerated and clear.  There is no evidence of focal opacification, pleural effusion or pneumothorax.  The heart is normal in size; the mediastinal contour is within normal limits.  No acute osseous abnormalities are seen.  IMPRESSION: No acute cardiopulmonary process seen.  Original Report Authenticated By: Tonia Ghent, M.D.     1. Bronchitis       MDM  Pt with 2-3 days hx of URI symptoms, fever, cough, now with increasing wheezing and chest tightness.  No leg pain or swelling.  Has hx of bronchitis with similar symptoms in past.  CXR neg for pneumonia.  Will try neb.  Doubt PE. HR 72, sats 97% on RA.    Pt felt somewhat better after albuterol, will send home with inhaler, steroids.  Advised to f/u with PMD on Monday        Rolan Bucco, MD 07/24/11 [phone]

## 2011-07-29 ENCOUNTER — Encounter (HOSPITAL_BASED_OUTPATIENT_CLINIC_OR_DEPARTMENT_OTHER): Payer: Self-pay | Admitting: *Deleted

## 2011-07-29 ENCOUNTER — Emergency Department (INDEPENDENT_AMBULATORY_CARE_PROVIDER_SITE_OTHER): Payer: Medicaid Other

## 2011-07-29 ENCOUNTER — Emergency Department (HOSPITAL_BASED_OUTPATIENT_CLINIC_OR_DEPARTMENT_OTHER)
Admission: EM | Admit: 2011-07-29 | Discharge: 2011-07-29 | Disposition: A | Discharge status: Home / Self Care | Payer: Medicaid Other | Attending: Emergency Medicine | Admitting: Emergency Medicine

## 2011-07-29 DIAGNOSIS — F0781 Postconcussional syndrome: Secondary | ICD-10-CM | POA: Insufficient documentation

## 2011-07-29 DIAGNOSIS — F17200 Nicotine dependence, unspecified, uncomplicated: Secondary | ICD-10-CM | POA: Insufficient documentation

## 2011-07-29 DIAGNOSIS — W19XXXA Unspecified fall, initial encounter: Secondary | ICD-10-CM

## 2011-07-29 DIAGNOSIS — R51 Headache: Secondary | ICD-10-CM

## 2011-07-29 DIAGNOSIS — M25529 Pain in unspecified elbow: Secondary | ICD-10-CM

## 2011-07-29 DIAGNOSIS — J42 Unspecified chronic bronchitis: Secondary | ICD-10-CM | POA: Insufficient documentation

## 2011-07-29 DIAGNOSIS — F411 Generalized anxiety disorder: Secondary | ICD-10-CM | POA: Insufficient documentation

## 2011-07-29 DIAGNOSIS — F419 Anxiety disorder, unspecified: Secondary | ICD-10-CM

## 2011-07-29 DIAGNOSIS — M25519 Pain in unspecified shoulder: Secondary | ICD-10-CM

## 2011-07-29 LAB — URINALYSIS, ROUTINE W REFLEX MICROSCOPIC
Bilirubin Urine: NEGATIVE
Glucose, UA: NEGATIVE mg/dL
Ketones, ur: NEGATIVE mg/dL
Leukocytes, UA: NEGATIVE
Specific Gravity, Urine: 1.023 (ref 1.005–1.030)
pH: 6 (ref 5.0–8.0)

## 2011-07-29 LAB — CBC
MCH: 31 pg (ref 26.0–34.0)
MCHC: 34 g/dL (ref 30.0–36.0)
Platelets: 381 10*3/uL (ref 150–400)
RBC: 4.1 MIL/uL (ref 3.87–5.11)
RDW: 12 % (ref 11.5–15.5)

## 2011-07-29 LAB — COMPREHENSIVE METABOLIC PANEL
ALT: 27 U/L (ref 0–35)
Albumin: 3.7 g/dL (ref 3.5–5.2)
Alkaline Phosphatase: 78 U/L (ref 39–117)
Calcium: 9.4 mg/dL (ref 8.4–10.5)
Potassium: 3.3 mEq/L — ABNORMAL LOW (ref 3.5–5.1)
Sodium: 136 mEq/L (ref 135–145)
Total Protein: 7 g/dL (ref 6.0–8.3)

## 2011-07-29 LAB — DIFFERENTIAL
Basophils Absolute: 0 10*3/uL (ref 0.0–0.1)
Basophils Relative: 0 % (ref 0–1)
Eosinophils Absolute: 0 10*3/uL (ref 0.0–0.7)
Lymphs Abs: 1.6 10*3/uL (ref 0.7–4.0)
Neutrophils Relative %: 71 % (ref 43–77)

## 2011-07-29 MED ORDER — LORAZEPAM 1 MG PO TABS
2.0000 mg | ORAL_TABLET | Freq: Once | ORAL | Status: AC
  Administered 2011-07-29: 2 mg via ORAL
  Filled 2011-07-29 (×2): qty 1

## 2011-07-29 MED ORDER — NAPROXEN 500 MG PO TABS: 500.0000 mg | ORAL_TABLET | Freq: Two times a day (BID) | ORAL | Status: DC

## 2011-07-29 MED ORDER — MORPHINE SULFATE 4 MG/ML IJ SOLN
4.0000 mg | Freq: Once | INTRAMUSCULAR | Status: AC
  Administered 2011-07-29: 4 mg via INTRAMUSCULAR
  Filled 2011-07-29: qty 1

## 2011-07-29 NOTE — ED Provider Notes (Signed)
History     CSN: [id number]  Arrival date & time 07/29/11  0126   First MD Initiated Contact with Patient 07/29/11 [phone]      Chief Complaint  Patient presents with  . Fall    (Consider location/radiation/quality/duration/timing/severity/associated sxs/prior treatment) HPI Comments: 31 year old female with a history of anxiety and panic attacks who presents with increased confusion, a brief period of hallucinations and a headache with right shoulder and elbow pain status post fall 48 hours ago. He says she fell while in the shower when her feet slipped striking her right elbow shoulder and head. She is unsure if she had a loss of consciousness but since that time has had a mild persistent headache and then tonight developed auditory hallucinations feeling like there were voices telling her to get up out of bed and do some housework. She denies suicidal thoughts, depression and states that currently she feels better than she has a long time. She is being treated for anxiety by her physician with Celexa, Klonopin and Paxil, stating that she has been out of her clonazepam for approximately one week.  The hallucinations were intermittent, the pain in her head and shoulder and elbow is persistent. She denies nausea vomiting changes in vision or ataxia.  Patient is a 31 y.o. female presenting with fall. The history is provided by the patient, a friend and medical records.  Fall    Past Medical History  Diagnosis Date  . Bursitis of hip   . Hip joint pain   . Anxiety   . Chronic bronchitis     Past Surgical History  Procedure Date  . Knee arthroscopy   . Cesarean section     2002  . Mouth surgery 2010    Family History  Problem Relation Age of Onset  . Hypertension Mother   . Diabetes Other   . Cancer Other     History  Substance Use Topics  . Smoking status: Current Everyday Smoker -- 0.5 packs/day    Types: Cigarettes  . Smokeless tobacco: Never Used  . Alcohol Use: No     OB History    Grav Para Term Preterm Abortions TAB SAB Ect Mult Living   3 3              Review of Systems  All other systems reviewed and are negative.    Allergies  Cucumber extract; Flagyl; Macrobid; Watermelon concentrate; Darvocet; Ketorolac; Ultracet; Ultram; and Vicodin  Home Medications   Current Outpatient Rx  Name Route Sig Dispense Refill  . BECLOMETHASONE DIPROPIONATE 80 MCG/ACT IN AERS Inhalation Inhale 2 puffs into the lungs as needed. Shortness of breath and wheezing     . CITALOPRAM HYDROBROMIDE 10 MG PO TABS Oral Take 10 mg by mouth daily.     Marland Kitchen CLONAZEPAM 1 MG PO TABS Oral Take 1 mg by mouth daily.     Marland Kitchen DIPHENHYDRAMINE HCL 25 MG PO TABS Oral Take 25 mg by mouth every 6 (six) hours as needed. For allergies     . NAPROXEN 500 MG PO TABS Oral Take 1 tablet (500 mg total) by mouth 2 (two) times daily with a meal. 30 tablet 0  . NORETHINDRONE ACET-ETHINYL EST 1.5-30 MG-MCG PO TABS Oral Take 1 tablet by mouth daily.      . OXYCODONE-ACETAMINOPHEN 10-325 MG PO TABS Oral Take 1 tablet by mouth 3 (three) times daily.     Marland Kitchen PAROXETINE HCL 10 MG PO TABS Oral Take 10 mg by mouth  every morning.      Marland Kitchen ZOLPIDEM TARTRATE 10 MG PO TABS Oral Take 10 mg by mouth at bedtime.       BP 137/86  Pulse 95  Temp(Src) 98.4 F (36.9 C) (Oral)  Resp 20  Ht 5\' 6"  (1.676 m)  Wt 105 lb (47.628 kg)  BMI 16.95 kg/m2  SpO2 96%  LMP 07/23/2011  Physical Exam  Nursing note and vitals reviewed. Constitutional: She appears well-developed and well-nourished. No distress.  HENT:  Head: Normocephalic.  Mouth/Throat: Oropharynx is clear and moist. No oropharyngeal exudate.       Tender to palpation over the right side of the head, no obvious hematoma or laceration, no hemotympanum, malocclusion, raccoon eyes, battle sign  Eyes: Conjunctivae and EOM are normal. Pupils are equal, round, and reactive to light. Right eye exhibits no discharge. Left eye exhibits no discharge. No scleral  icterus.  Neck: Normal range of motion. Neck supple. No JVD present. No thyromegaly present.  Cardiovascular: Normal rate, regular rhythm, normal heart sounds and intact distal pulses.  Exam reveals no gallop and no friction rub.   No murmur heard. Pulmonary/Chest: Effort normal and breath sounds normal. No respiratory distress. She has no wheezes. She has no rales.  Abdominal: Soft. Bowel sounds are normal. She exhibits no distension and no mass. There is no tenderness.  Musculoskeletal: Normal range of motion. She exhibits tenderness ( Mild tenderness to the right shoulder and right elbow with supple joints but pain with range of motion. No pain in hip or knee with range of motion. No evidence of external trauma). She exhibits no edema.  Lymphadenopathy:    She has no cervical adenopathy.  Neurological: She is alert. Coordination normal.  Skin: Skin is warm and dry. No rash noted. No erythema.  Psychiatric:       Tearful and anxious, denies suicidality or depression, no active hallucinations    ED Course  Procedures (including critical care time)   Labs Reviewed  CBC  DIFFERENTIAL  URINALYSIS, ROUTINE W REFLEX MICROSCOPIC  PREGNANCY, URINE  COMPREHENSIVE METABOLIC PANEL   Dg Shoulder Right  07/29/2011  *RADIOLOGY REPORT*  Clinical Data: Right shoulder and elbow pain after fall last night.  RIGHT SHOULDER - 2+ VIEW  Comparison: None.  Findings: The right shoulder appears intact.  No evidence of acute fracture or subluxation.  No focal bone lesion or bone destruction. Visualized right upper ribs are not displaced.  Acromioclavicular and coracoclavicular spaces are intact.  IMPRESSION: No acute bony abnormalities identified.  Original Report Authenticated By: Marlon Pel, M.D.   Dg Elbow Complete Right  07/29/2011  *RADIOLOGY REPORT*  Clinical Data: Right elbow pain after fall.  RIGHT ELBOW - COMPLETE 3+ VIEW  Comparison: None.  Findings: The right elbow appears intact.  No evidence  of acute fracture or subluxation.  No focal bone lesion or bone destruction. Bone cortex and trabecular architecture appear intact.  No significant effusion.  IMPRESSION: No acute bony abnormalities identified.  Original Report Authenticated By: Marlon Pel, M.D.   Ct Head Wo Contrast  07/29/2011  *RADIOLOGY REPORT*  Clinical Data: Headache after fall yesterday.  No loss of consciousness.  CT HEAD WITHOUT CONTRAST  Technique:  Contiguous axial images were obtained from the base of the skull through the vertex without contrast.  Comparison: 01/02/2011  Findings: Ventricles and sulci appear symmetrical.  No mass effect or midline shift.  No abnormal extra-axial fluid collections.  Wallace Cullens- white matter junctions are distinct.  Basal  cisterns are not effaced.  No evidence of acute intracranial hemorrhage.  Visualized paranasal sinuses are not opacified.  No depressed skull fractures. No significant change since previous study.  IMPRESSION: No evidence of acute intracranial hemorrhage, mass lesion, or acute infarct.  Original Report Authenticated By: Marlon Pel, M.D.     1. Anxiety   2. Post concussion syndrome       MDM  Head injury, now having hallucinations and headache with right shoulder elbow pain. She has also been started on prednisone recently for bronchitis. Question reaction to medication versus head injury and concussion. CT scan, labs as patient does admit to mild dysuria, plain films of the shoulder and elbow. Pain medication with intramuscular morphine as patient has arty had Percocet prior to arrival without improvement    CT scan negative for acute bleed or mass, plain film imaging of shoulder and elbow without fracture, blood work and urinalysis clean and vital signs persistently normal. Patient has improved significantly after Ativan for anxiety and currently feeling at baseline. We'll discharge home with referral to psychiatry followup as outpatient.  I have discussed  rice therapy recommendations for her for home.    Vida Roller, MD 07/29/11 [phone]

## 2011-07-29 NOTE — ED Notes (Signed)
States that she fell getting out of the shower. [place] and hit her right shoulder, right hip and her head. Fall took place last night, was ok all day and then this evening she began having "pain that would not stop, that was trying to take control of her body". States that the percocet 10/325 helped for a little while, but not anymore. Both legs hurt. The neighbor states that she can't have a coherent conversation, that the patient told her there were voices in her head and also states she keeps forgetting where she is.

## 2011-07-29 NOTE — ED Notes (Signed)
During triage patient was able to answer all questions regarding her history and medications, although the neighbor was attempting to speak on the patients behalf several times. Follows directions appropriately and stated that she was able to care for her children and carry out daily activities as normal until waking up this morning.

## 2011-09-06 ENCOUNTER — Encounter (HOSPITAL_COMMUNITY): Payer: Self-pay | Admitting: Adult Health

## 2011-09-06 ENCOUNTER — Emergency Department (HOSPITAL_COMMUNITY)
Admission: EM | Admit: 2011-09-06 | Discharge: 2011-09-06 | Disposition: A | Discharge status: Home / Self Care | Payer: Medicaid Other | Attending: Emergency Medicine | Admitting: Emergency Medicine

## 2011-09-06 DIAGNOSIS — Z79899 Other long term (current) drug therapy: Secondary | ICD-10-CM | POA: Insufficient documentation

## 2011-09-06 DIAGNOSIS — M5380 Other specified dorsopathies, site unspecified: Secondary | ICD-10-CM | POA: Insufficient documentation

## 2011-09-06 DIAGNOSIS — IMO0001 Reserved for inherently not codable concepts without codable children: Secondary | ICD-10-CM | POA: Insufficient documentation

## 2011-09-06 DIAGNOSIS — M545 Low back pain, unspecified: Secondary | ICD-10-CM | POA: Insufficient documentation

## 2011-09-06 DIAGNOSIS — F411 Generalized anxiety disorder: Secondary | ICD-10-CM | POA: Insufficient documentation

## 2011-09-06 DIAGNOSIS — J42 Unspecified chronic bronchitis: Secondary | ICD-10-CM | POA: Insufficient documentation

## 2011-09-06 DIAGNOSIS — M546 Pain in thoracic spine: Secondary | ICD-10-CM | POA: Insufficient documentation

## 2011-09-06 DIAGNOSIS — M542 Cervicalgia: Secondary | ICD-10-CM | POA: Insufficient documentation

## 2011-09-06 DIAGNOSIS — T148XXA Other injury of unspecified body region, initial encounter: Secondary | ICD-10-CM

## 2011-09-06 DIAGNOSIS — F17200 Nicotine dependence, unspecified, uncomplicated: Secondary | ICD-10-CM | POA: Insufficient documentation

## 2011-09-06 MED ORDER — HYDROMORPHONE HCL PF 2 MG/ML IJ SOLN
2.0000 mg | Freq: Once | INTRAMUSCULAR | Status: AC
  Administered 2011-09-06: 2 mg via INTRAMUSCULAR
  Filled 2011-09-06: qty 1
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
## 2011-09-07 NOTE — ED Provider Notes (Signed)
Medical screening examination/treatment/procedure(s) were performed by non-physician practitioner and as supervising physician I was immediately available for consultation/collaboration.   Bethany Hirt, MD 09/07/11 0635 

## 2011-12-08 IMAGING — CR DG CERVICAL SPINE COMPLETE 4+V
5 series · 5 of 5 positions shown · non-contrast
Comparison: 07/25/2010

CLINICAL DATA: Hit forehead.  Syncope.

CERVICAL SPINE - COMPLETE 4+ VIEW

[w c-spine a.p.]
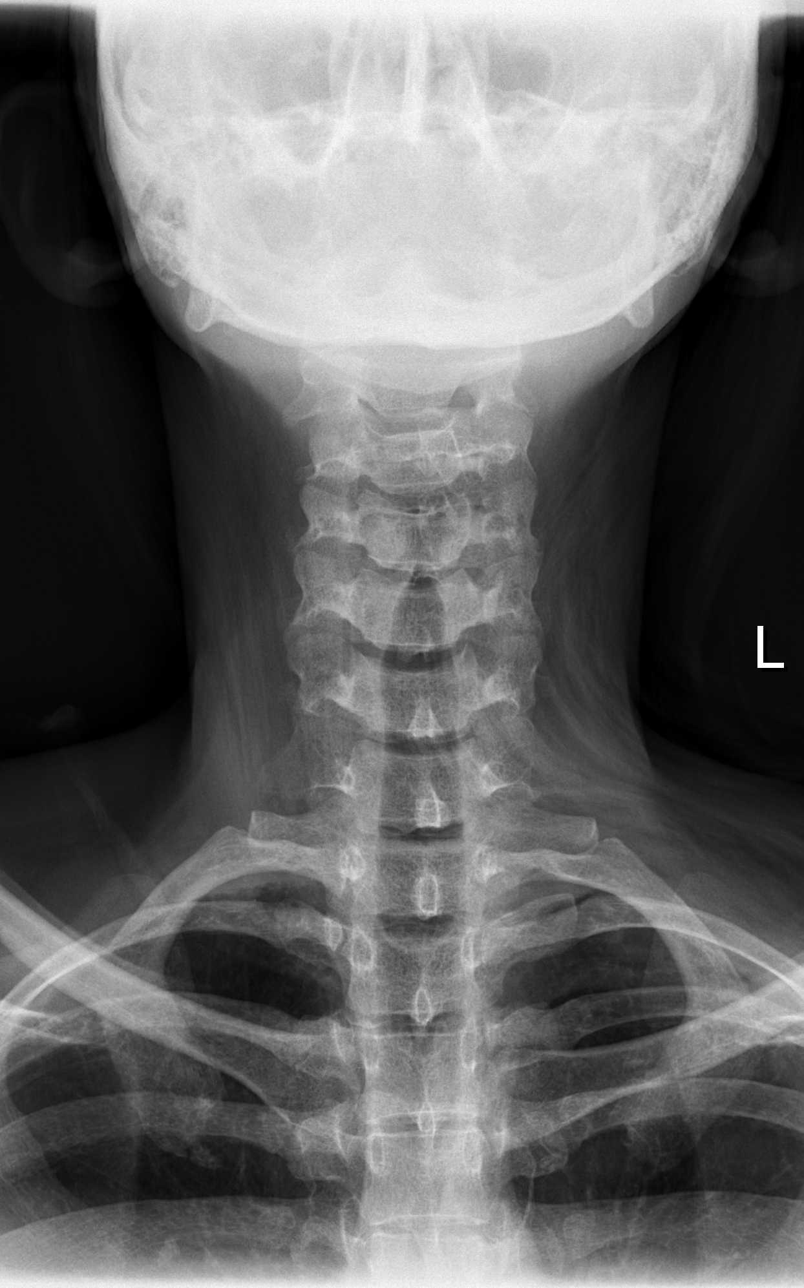

[w c-spine oblique (1 of 2)]
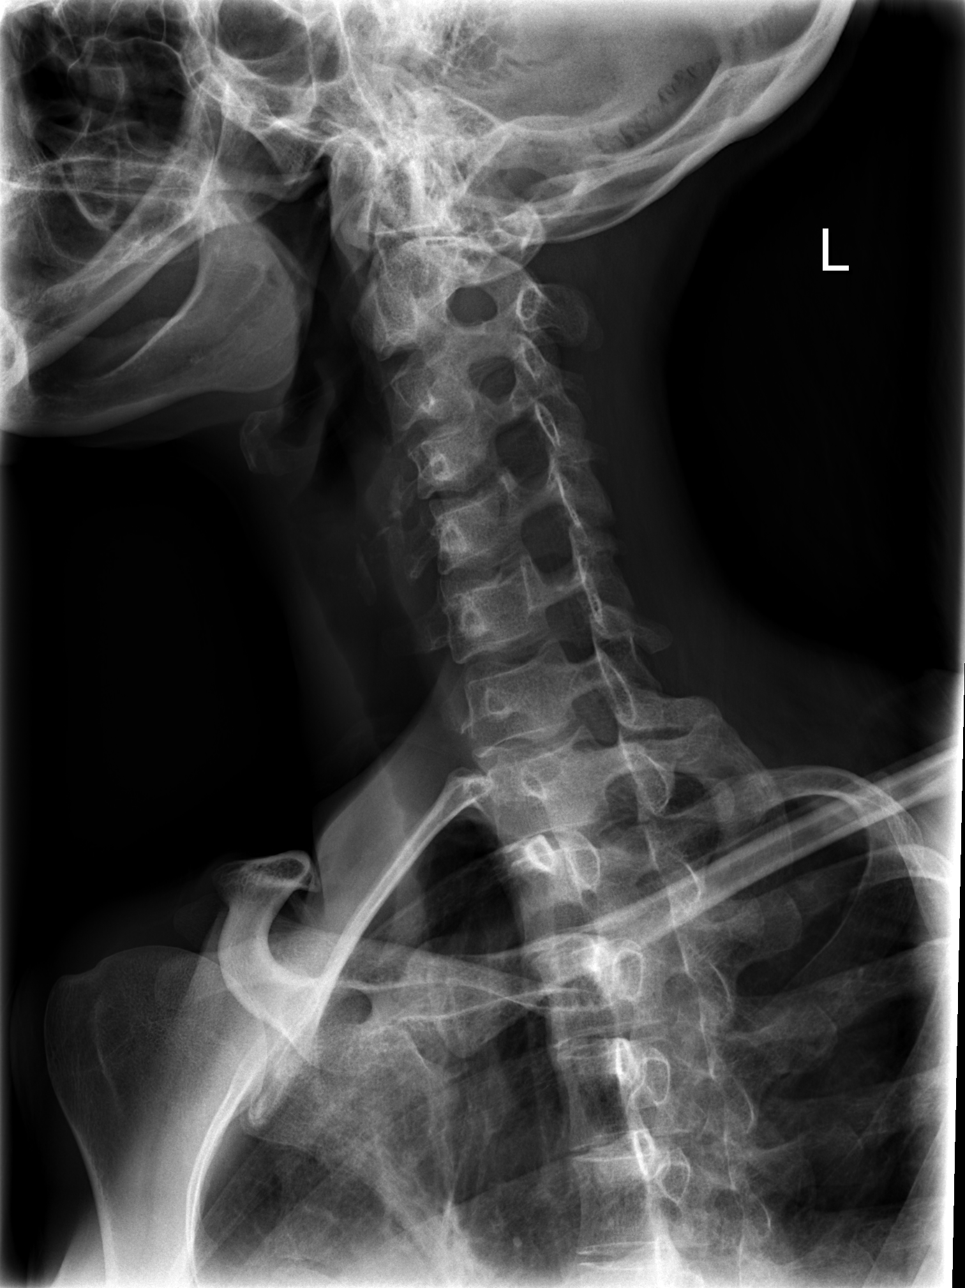

[w c-spine oblique (2 of 2)]
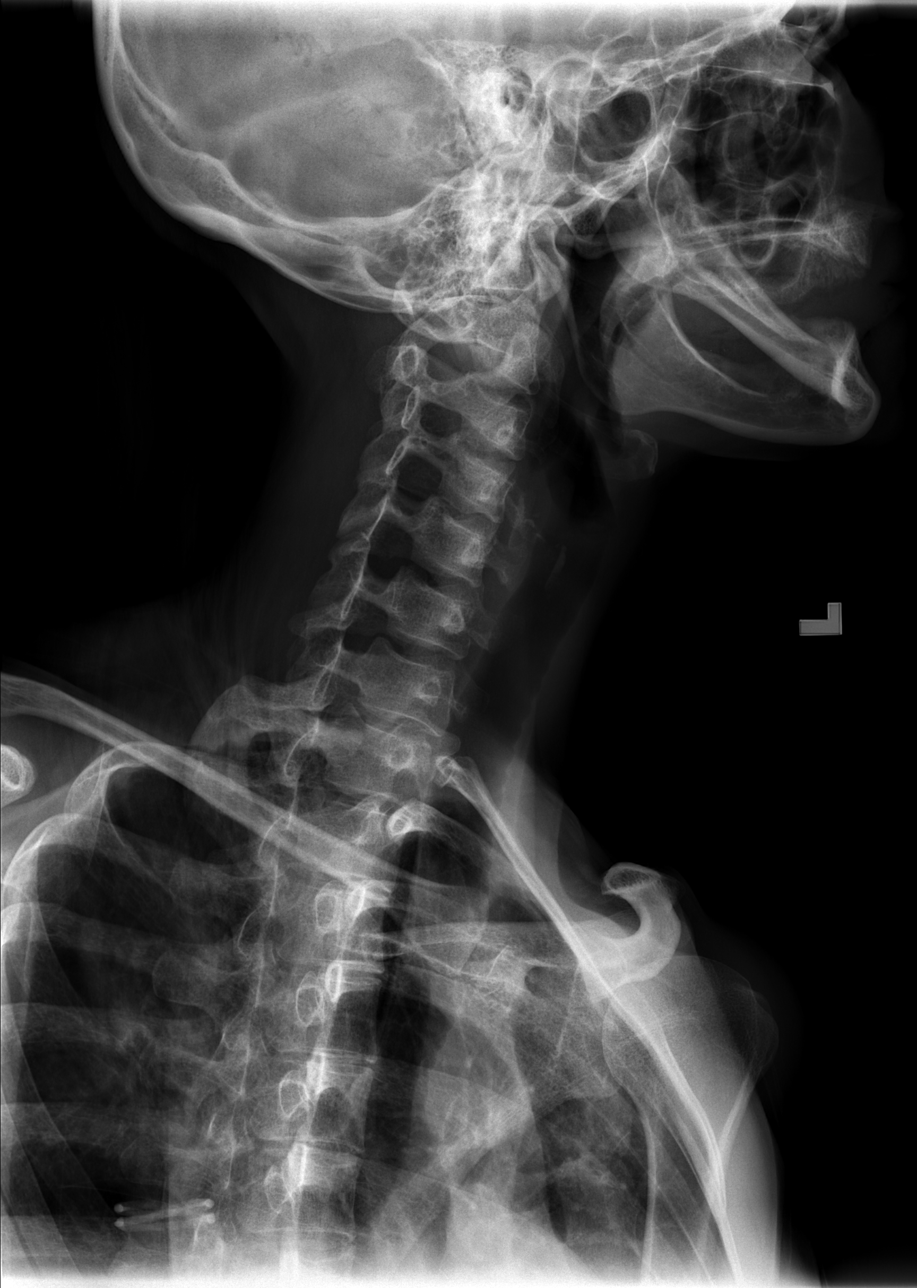

[w c-spine lat]
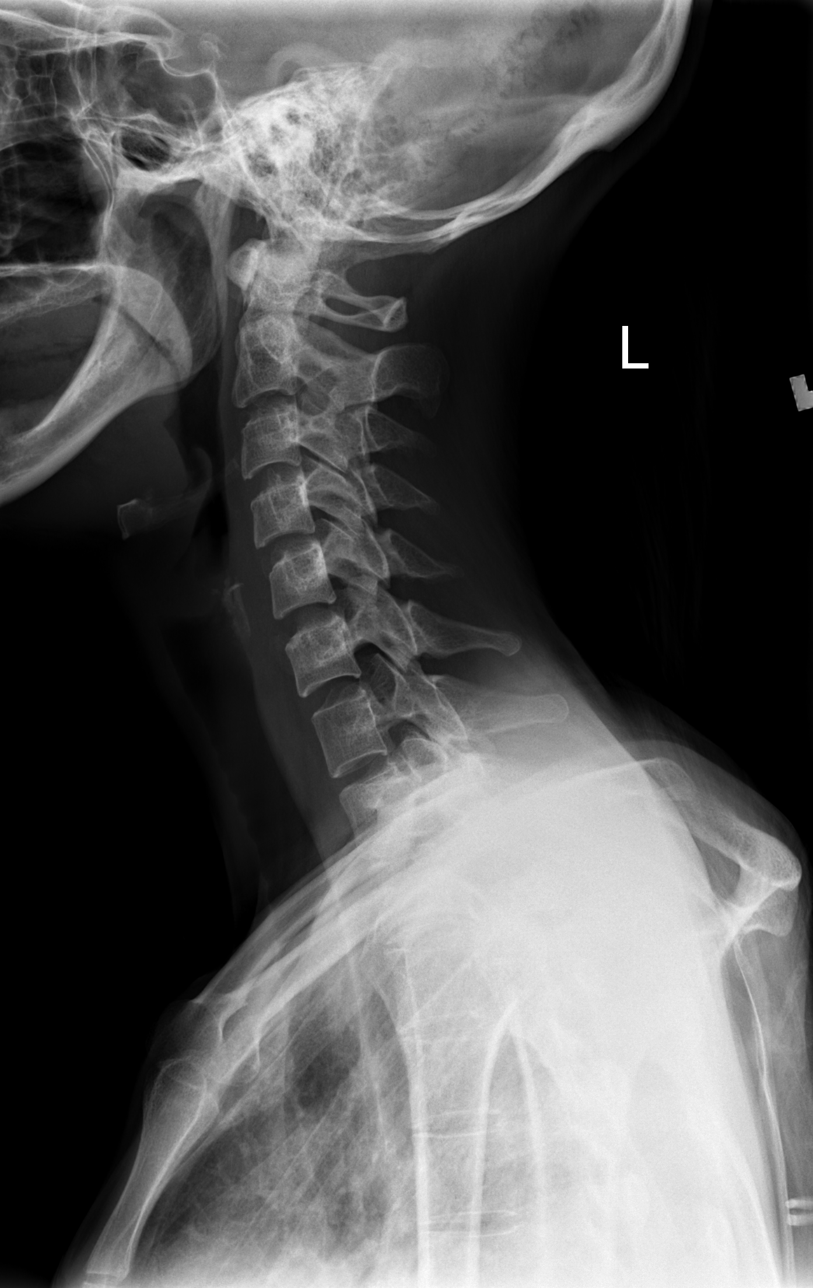

[w c-spine odontoid]
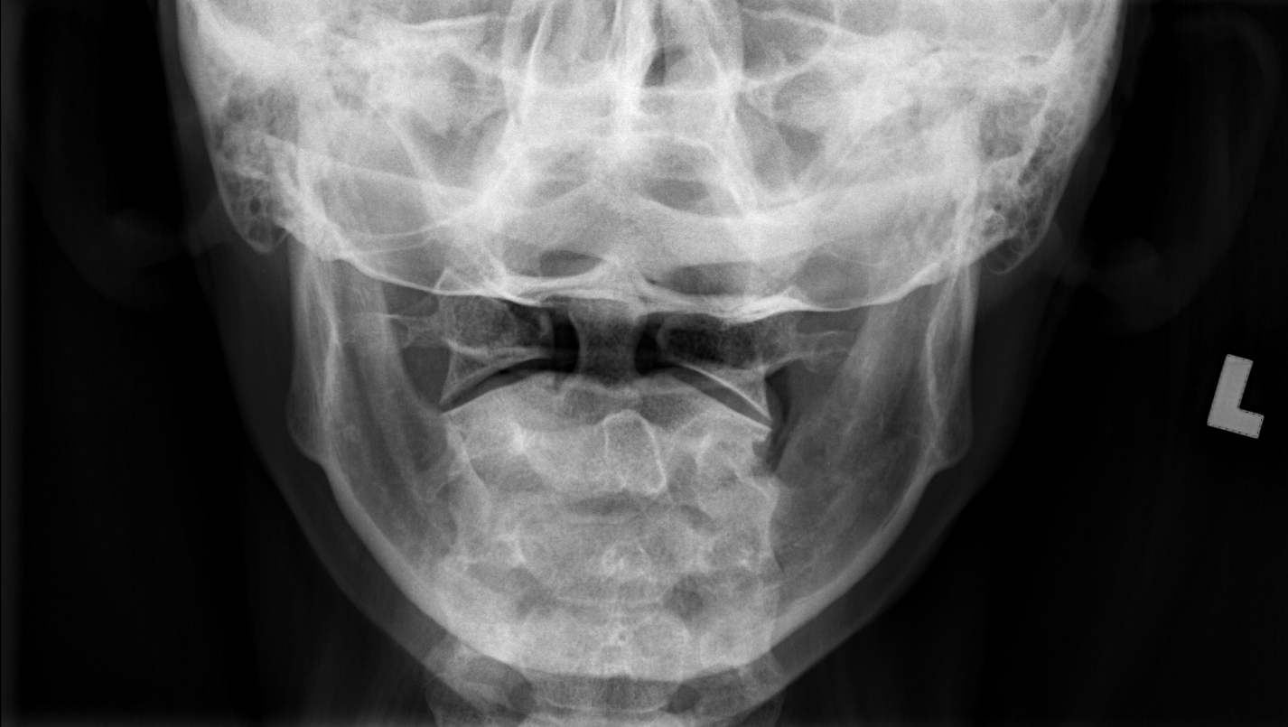

[5 of 5 positions shown; findings below may reference images not displayed]

FINDINGS: No evidence for fracture.  No subluxation.
Intervertebral disc spaces are preserved throughout.  Facets are
well-aligned bilaterally.  There is no prevertebral soft tissue
swelling.
IMPRESSION: Normal exam.

## 2011-12-15 ENCOUNTER — Encounter (HOSPITAL_BASED_OUTPATIENT_CLINIC_OR_DEPARTMENT_OTHER): Payer: Self-pay | Admitting: Emergency Medicine

## 2011-12-15 ENCOUNTER — Emergency Department (HOSPITAL_BASED_OUTPATIENT_CLINIC_OR_DEPARTMENT_OTHER)
Admission: EM | Admit: 2011-12-15 | Discharge: 2011-12-15 | Disposition: A | Discharge status: Home / Self Care | Payer: Medicaid Other | Attending: Emergency Medicine | Admitting: Emergency Medicine

## 2011-12-15 DIAGNOSIS — J3489 Other specified disorders of nose and nasal sinuses: Secondary | ICD-10-CM | POA: Insufficient documentation

## 2011-12-15 DIAGNOSIS — J42 Unspecified chronic bronchitis: Secondary | ICD-10-CM | POA: Insufficient documentation

## 2011-12-15 DIAGNOSIS — F17200 Nicotine dependence, unspecified, uncomplicated: Secondary | ICD-10-CM | POA: Insufficient documentation

## 2011-12-15 DIAGNOSIS — R059 Cough, unspecified: Secondary | ICD-10-CM | POA: Insufficient documentation

## 2011-12-15 DIAGNOSIS — R05 Cough: Secondary | ICD-10-CM | POA: Insufficient documentation

## 2011-12-15 MED ORDER — AZITHROMYCIN 250 MG PO TABS: 250.0000 mg | ORAL_TABLET | Freq: Every day | ORAL | Status: AC

## 2011-12-15 NOTE — Discharge Instructions (Signed)
Cough, Adult  A cough is a reflex. It helps you clear your throat and airways. A cough can help heal your body. A cough can last 2 or 3 weeks (acute) or may last more than 8 weeks (chronic). Some common causes of a cough can include an infection, allergy, or a cold. HOME CARE  Only take medicine as told by your doctor.   If given, take your medicines (antibiotics) as told. Finish them even if you start to feel better.   Use a cold steam vaporizer or humidier in your home. This can help loosen thick spit (secretions).   Sleep so you are almost sitting up (semi-upright). Use pillows to do this. This helps reduce coughing.   Rest as needed.   Stop smoking if you smoke.  GET HELP RIGHT AWAY IF:  You have yellowish-white fluid (pus) in your thick spit.   Your cough gets worse.   Your medicine does not reduce coughing, and you are losing sleep.   You cough up blood.   You have trouble breathing.   Your pain gets worse and medicine does not help.   You have a fever.  MAKE SURE YOU:   Understand these instructions.   Will watch your condition.   Will get help right away if you are not doing well or get worse.  Document Released: 03/26/2011 Document Revised: 07/02/2011 Document Reviewed: 03/26/2011 [employer] Patient Information 2012 [employer]. 

## 2011-12-15 NOTE — ED Notes (Signed)
Runny nose, low grade fever, cough, painful coughing x one month.

## 2011-12-15 NOTE — ED Provider Notes (Signed)
Medical screening examination/treatment/procedure(s) were performed by non-physician practitioner and as supervising physician I was immediately available for consultation/collaboration.  Sayre Witherington, MD 12/15/11 2354 

## 2011-12-15 NOTE — ED Provider Notes (Signed)
History     CSN: [id number]  Arrival date & time 12/15/11  1752   First MD Initiated Contact with Patient 12/15/11 1813      Chief Complaint  Patient presents with  . Nasal Congestion  . Cough  . Pleurisy    (Consider location/radiation/quality/duration/timing/severity/associated sxs/prior treatment) Patient is a 31 y.o. female presenting with cough. The history is provided by the patient. No language interpreter was used.  Cough This is a new problem. The current episode started more than 1 week ago. The problem occurs constantly. The problem has not changed since onset.The cough is productive of sputum. There has been no fever. Associated symptoms include ear congestion and rhinorrhea. She has tried nothing for the symptoms. She is a smoker.    Past Medical History  Diagnosis Date  . Bursitis of hip   . Hip joint pain   . Anxiety   . Chronic bronchitis     Past Surgical History  Procedure Date  . Knee arthroscopy   . Cesarean section     2002  . Mouth surgery 2010    Family History  Problem Relation Age of Onset  . Hypertension Mother   . Diabetes Other   . Cancer Other     History  Substance Use Topics  . Smoking status: Current Everyday Smoker -- 0.5 packs/day    Types: Cigarettes  . Smokeless tobacco: Never Used  . Alcohol Use: No    OB History    Grav Para Term Preterm Abortions TAB SAB Ect Mult Living   3 3              Review of Systems  Constitutional: Negative.   HENT: Positive for rhinorrhea.   Respiratory: Positive for cough.   Cardiovascular: Negative.   Neurological: Negative.     Allergies  Cucumber extract; Ketorolac tromethamine; Macrobid; Naproxen; Penicillins; Watermelon concentrate; Darvocet; Flagyl; Ketorolac; Ultracet; Ultram; and Vicodin  Home Medications   Current Outpatient Rx  Name Route Sig Dispense Refill  . CARBAMAZEPINE 200 MG PO TABS Oral Take 200 mg by mouth daily.    Marland Kitchen CLONAZEPAM 1 MG PO TABS Oral Take 1 mg by  mouth daily.     Marland Kitchen DIPHENHYDRAMINE HCL 25 MG PO TABS Oral Take 25 mg by mouth every 6 (six) hours as needed. allergies    . DM-PHENYLEPHRINE-ACETAMINOPHEN 10-5-325 MG PO TABS Oral Take 2 capsules by mouth.    . IBUPROFEN 800 MG PO TABS Oral Take 800 mg by mouth every 8 (eight) hours as needed. For pain    . OXYCODONE-ACETAMINOPHEN 10-325 MG PO TABS Oral Take 1 tablet by mouth 3 (three) times daily.       BP 104/68  Pulse 115  Temp(Src) 99.5 F (37.5 C) (Oral)  Resp 16  Ht 5\' 6"  (1.676 m)  Wt 98 lb (44.453 kg)  BMI 15.82 kg/m2  SpO2 92%  LMP 11/10/2011  Physical Exam  Nursing note and vitals reviewed. Constitutional: She is oriented to person, place, and time. She appears well-developed and well-nourished.  HENT:  Head: Normocephalic and atraumatic.  Eyes: Conjunctivae and EOM are normal.  Neck: Neck supple.  Cardiovascular: Normal rate and regular rhythm.   Pulmonary/Chest: Effort normal and breath sounds normal.  Musculoskeletal: Normal range of motion.  Neurological: She is alert and oriented to person, place, and time.  Skin: Skin is warm and dry.  Psychiatric: She has a normal mood and affect.    ED Course  Procedures (including critical care  time)  Labs Reviewed - No data to display No results found.   1. Cough       MDM  Will treat based on exposure to whooping cough        Teressa Lower, NP 12/15/11 1940

## 2011-12-23 ENCOUNTER — Emergency Department (HOSPITAL_BASED_OUTPATIENT_CLINIC_OR_DEPARTMENT_OTHER)
Admission: EM | Admit: 2011-12-23 | Discharge: 2011-12-23 | Disposition: A | Discharge status: Home / Self Care | Payer: Medicaid Other | Attending: Emergency Medicine | Admitting: Emergency Medicine

## 2011-12-23 ENCOUNTER — Encounter (HOSPITAL_BASED_OUTPATIENT_CLINIC_OR_DEPARTMENT_OTHER): Payer: Self-pay | Admitting: *Deleted

## 2011-12-23 DIAGNOSIS — Z043 Encounter for examination and observation following other accident: Secondary | ICD-10-CM | POA: Insufficient documentation

## 2011-12-23 DIAGNOSIS — F17200 Nicotine dependence, unspecified, uncomplicated: Secondary | ICD-10-CM | POA: Insufficient documentation

## 2011-12-23 DIAGNOSIS — M791 Myalgia, unspecified site: Secondary | ICD-10-CM
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
## 2012-08-04 ENCOUNTER — Ambulatory Visit (HOSPITAL_COMMUNITY)
Admission: RE | Admit: 2012-08-04 | Discharge: 2012-08-04 | Disposition: A | Discharge status: Home / Self Care | Payer: Medicaid Other | Source: Ambulatory Visit | Attending: Obstetrics & Gynecology | Admitting: Obstetrics & Gynecology

## 2012-08-04 ENCOUNTER — Other Ambulatory Visit: Payer: Self-pay | Admitting: Obstetrics & Gynecology

## 2012-08-04 DIAGNOSIS — R1031 Right lower quadrant pain: Secondary | ICD-10-CM | POA: Insufficient documentation

## 2012-08-04 DIAGNOSIS — N946 Dysmenorrhea, unspecified: Secondary | ICD-10-CM | POA: Insufficient documentation

## 2012-08-04 DIAGNOSIS — N949 Unspecified condition associated with female genital organs and menstrual cycle: Secondary | ICD-10-CM

## 2012-08-25 ENCOUNTER — Encounter (HOSPITAL_BASED_OUTPATIENT_CLINIC_OR_DEPARTMENT_OTHER): Payer: Self-pay | Admitting: Emergency Medicine

## 2012-08-25 ENCOUNTER — Emergency Department (HOSPITAL_BASED_OUTPATIENT_CLINIC_OR_DEPARTMENT_OTHER)
Admission: EM | Admit: 2012-08-25 | Discharge: 2012-08-25 | Disposition: A | Discharge status: Home / Self Care | Payer: Medicaid Other | Attending: Emergency Medicine | Admitting: Emergency Medicine

## 2012-08-25 DIAGNOSIS — F319 Bipolar disorder, unspecified: Secondary | ICD-10-CM | POA: Insufficient documentation

## 2012-08-25 DIAGNOSIS — Z8739 Personal history of other diseases of the musculoskeletal system and connective tissue: Secondary | ICD-10-CM | POA: Insufficient documentation

## 2012-08-25 DIAGNOSIS — Z3202 Encounter for pregnancy test, result negative: Secondary | ICD-10-CM | POA: Insufficient documentation

## 2012-08-25 DIAGNOSIS — R102 Pelvic and perineal pain: Secondary | ICD-10-CM

## 2012-08-25 DIAGNOSIS — Z8709 Personal history of other diseases of the respiratory system: Secondary | ICD-10-CM | POA: Insufficient documentation

## 2012-08-25 DIAGNOSIS — F411 Generalized anxiety disorder: Secondary | ICD-10-CM | POA: Insufficient documentation

## 2012-08-25 DIAGNOSIS — F17200 Nicotine dependence, unspecified, uncomplicated: Secondary | ICD-10-CM | POA: Insufficient documentation

## 2012-08-25 DIAGNOSIS — N949 Unspecified condition associated with female genital organs and menstrual cycle: Secondary | ICD-10-CM | POA: Insufficient documentation

## 2012-08-25 HISTORY — DX: Bipolar disorder, unspecified: F31.9

## 2012-08-25 LAB — URINALYSIS, ROUTINE W REFLEX MICROSCOPIC
Glucose, UA: NEGATIVE mg/dL
Ketones, ur: NEGATIVE mg/dL
Leukocytes, UA: NEGATIVE
Nitrite: NEGATIVE
Protein, ur: NEGATIVE mg/dL
pH: 6 (ref 5.0–8.0)

## 2012-08-25 LAB — WET PREP, GENITAL
Trich, Wet Prep: NONE SEEN
Yeast Wet Prep HPF POC: NONE SEEN

## 2012-08-25 LAB — URINE MICROSCOPIC-ADD ON

## 2012-08-25 LAB — PREGNANCY, URINE: Preg Test, Ur: NEGATIVE

## 2012-08-25 MED ORDER — OXYCODONE-ACETAMINOPHEN 5-325 MG PO TABS: 2.0000 | ORAL_TABLET | ORAL | Status: DC | PRN

## 2012-08-25 NOTE — ED Notes (Signed)
ED PAC at bedside. 

## 2012-08-25 NOTE — ED Provider Notes (Signed)
History     CSN: [id number]  Arrival date & time 08/25/12  1909   First MD Initiated Contact with Patient 08/25/12 1928      Chief Complaint  Patient presents with  . Vaginal Pain    (Consider location/radiation/quality/duration/timing/severity/associated sxs/prior treatment) HPI Patient presents to the emergency department complaining of pelvic pain for the past 4 weeks. She has been evaluated by the gynecologist 2 times in the last 4 weeks, and she said that they have run "all the tests," including TVUS, abdominal US, pregnancy test, urinalysis, etc. She says the pain is suprapubic around her lower abdomen. She has tried meloxicam and Percocet, with no relief. She denies fever, chills, nausea, vomiting, diarrhea, constipation, syncope, weakness, and vaginal discharge. She does say that she wasn't supposed to start her period until next week, but she started today. She takes Loestrin for birth control, and is not sexually active.   Past Medical History  Diagnosis Date  . Bursitis of hip   . Hip joint pain   . Anxiety   . Chronic bronchitis   . Bipolar 1 disorder     Past Surgical History  Procedure Date  . Knee arthroscopy   . Cesarean section     2002  . Mouth surgery 2010    Family History  Problem Relation Age of Onset  . Hypertension Mother   . Diabetes Other   . Cancer Other     History  Substance Use Topics  . Smoking status: Current Every Day Smoker -- 0.5 packs/day    Types: Cigarettes  . Smokeless tobacco: Never Used  . Alcohol Use: No    OB History    Grav Para Term Preterm Abortions TAB SAB Ect Mult Living   3 3              Review of Systems All other systems negative except as documented in the HPI. All pertinent positives and negatives as reviewed in the HPI.  Allergies  Cucumber extract; Ketorolac tromethamine; Macrobid; Naproxen; Penicillins; Watermelon concentrate; Darvocet; Flagyl; Ketorolac; Ultracet; Ultram; and Vicodin  Home  Medications   Current Outpatient Rx  Name  Route  Sig  Dispense  Refill  . ACETAMINOPHEN 325 MG PO TABS   Oral   Take 650 mg by mouth every 6 (six) hours as needed.         . ALPRAZOLAM 0.5 MG PO TABS   Oral   Take 0.5 mg by mouth at bedtime as needed.         . CARBAMAZEPINE 200 MG PO TABS   Oral   Take 200 mg by mouth daily.         Marland Kitchen CLONAZEPAM 1 MG PO TABS   Oral   Take 1 mg by mouth daily.          . IBUPROFEN 200 MG PO TABS   Oral   Take 200 mg by mouth every 6 (six) hours as needed.         . MELOXICAM 15 MG PO TABS   Oral   Take 15 mg by mouth daily.         Marland Kitchen DIPHENHYDRAMINE HCL 25 MG PO TABS   Oral   Take 25 mg by mouth every 6 (six) hours as needed. allergies         . OXYCODONE-ACETAMINOPHEN 10-325 MG PO TABS   Oral   Take 1 tablet by mouth 3 (three) times daily.          Marland Kitchen  TUSSIN CF PO   Oral   Take 10 mLs by mouth daily as needed. Patient used this medication for her cold symptoms.           BP 114/80  Pulse 100  Temp 98.2 F (36.8 C) (Oral)  Resp 18  Ht 5\' 6"  (1.676 m)  Wt 106 lb (48.081 kg)  BMI 17.11 kg/m2  SpO2 100%  LMP 08/25/2012  Physical Exam  Constitutional: She is oriented to person, place, and time. She appears well-developed and well-nourished. No distress.  HENT:  Head: Normocephalic and atraumatic.  Cardiovascular: Normal rate, regular rhythm and normal heart sounds.  Exam reveals no gallop and no friction rub.   No murmur heard. Pulmonary/Chest: Effort normal.  Abdominal: Soft. Normal appearance. She exhibits no distension, no fluid wave and no mass. There is tenderness in the right lower quadrant, suprapubic area and left lower quadrant. There is guarding. There is no rigidity, no rebound, no tenderness at McBurney's point and negative Murphy's sign.  Genitourinary: Uterus normal. There is no rash or lesion on the right labia. There is no rash or lesion on the left labia. Cervix exhibits discharge (moderate  amount of dark red blood flowing from cervical os). Cervix exhibits no motion tenderness and no friability. Right adnexum displays tenderness. Right adnexum displays no mass and no fullness. Left adnexum displays no mass, no tenderness and no fullness. No erythema or tenderness around the vagina. No signs of injury around the vagina. No vaginal discharge found.  Neurological: She is alert and oriented to person, place, and time.  Skin: Skin is warm and dry. No rash noted. No erythema. No pallor.    ED Course  Procedures (including critical care time)  Labs Reviewed  URINALYSIS, ROUTINE W REFLEX MICROSCOPIC - Abnormal; Notable for the following:    Hgb urine dipstick TRACE (*)     All other components within normal limits  PREGNANCY, URINE  URINE MICROSCOPIC-ADD ON  WET PREP, GENITAL  GC/CHLAMYDIA PROBE AMP   Patient is referred to her GYN for further care. Return here as needed. Patient has multiple evals for this pain by her GYN.    MDM          Carlyle Dolly, PA-C 08/25/12 2206

## 2012-08-25 NOTE — ED Notes (Signed)
Pt has been recently released from her pain management MD.

## 2012-08-25 NOTE — ED Provider Notes (Signed)
Medical screening examination/treatment/procedure(s) were performed by non-physician practitioner and as supervising physician I was immediately available for consultation/collaboration.  Gilda Crease, MD 08/25/12 [phone]

## 2012-08-25 NOTE — ED Notes (Signed)
Pt reports sever pelvic pain for 2 weeks, is currently being followed by gyn and testing has been negative for source of pain. Pt started menstral cycle today and pain has became 10/10 constantly

## 2012-08-26 LAB — GC/CHLAMYDIA PROBE AMP
CT Probe RNA: NEGATIVE
GC Probe RNA: NEGATIVE

## 2012-10-02 ENCOUNTER — Encounter (HOSPITAL_BASED_OUTPATIENT_CLINIC_OR_DEPARTMENT_OTHER): Payer: Self-pay | Admitting: *Deleted

## 2012-10-02 ENCOUNTER — Emergency Department (HOSPITAL_BASED_OUTPATIENT_CLINIC_OR_DEPARTMENT_OTHER)
Admission: EM | Admit: 2012-10-02 | Discharge: 2012-10-02 | Disposition: A | Discharge status: Home / Self Care | Payer: Medicaid Other | Attending: Emergency Medicine | Admitting: Emergency Medicine

## 2012-10-02 DIAGNOSIS — H6690 Otitis media, unspecified, unspecified ear: Secondary | ICD-10-CM | POA: Insufficient documentation

## 2012-10-02 DIAGNOSIS — F319 Bipolar disorder, unspecified: Secondary | ICD-10-CM | POA: Insufficient documentation

## 2012-10-02 DIAGNOSIS — F17200 Nicotine dependence, unspecified, uncomplicated: Secondary | ICD-10-CM | POA: Insufficient documentation

## 2012-10-02 DIAGNOSIS — J3489 Other specified disorders of nose and nasal sinuses: Secondary | ICD-10-CM | POA: Insufficient documentation

## 2012-10-02 DIAGNOSIS — R059 Cough, unspecified: Secondary | ICD-10-CM | POA: Insufficient documentation

## 2012-10-02 DIAGNOSIS — Z8739 Personal history of other diseases of the musculoskeletal system and connective tissue: Secondary | ICD-10-CM | POA: Insufficient documentation

## 2012-10-02 DIAGNOSIS — Z79899 Other long term (current) drug therapy: Secondary | ICD-10-CM | POA: Insufficient documentation

## 2012-10-02 DIAGNOSIS — J42 Unspecified chronic bronchitis: Secondary | ICD-10-CM | POA: Insufficient documentation

## 2012-10-02 MED ORDER — ANTIPYRINE-BENZOCAINE 5.4-1.4 % OT SOLN
3.0000 [drp] | Freq: Once | OTIC | Status: AC
  Administered 2012-10-02: 3 [drp] via OTIC
  Filled 2012-10-02: qty 10

## 2012-10-02 MED ORDER — CEFDINIR 300 MG PO CAPS: 300.0000 mg | ORAL_CAPSULE | Freq: Two times a day (BID) | ORAL | Status: DC

## 2012-10-02 NOTE — ED Provider Notes (Signed)
History  This chart was scribed for Jamie Quarry, MD by Shari Heritage, ED Scribe. The patient was seen in room MHT13/MHT13. Patient's care was started at 1641.   CSN: [id number]  Arrival date & time 10/02/12  1507   First MD Initiated Contact with Patient 10/02/12 1641      Chief Complaint  Patient presents with  . Otalgia     Patient is a 32 y.o. female presenting with ear pain.  Otalgia Location:  Right Severity:  Moderate Duration:  1 week Timing:  Constant Progression:  Unchanged Chronicity:  New Associated symptoms: cough and rhinorrhea   Associated symptoms: no ear discharge      HPI Comments: [name] Space is a 32 y.o. female who presents to the Emergency Department complaining of oderate, constant right ear pain onset 1 week ago. Pain is unchanged with movement of the ear. She thinks that pain is localized inside the ear, rather than externally. Patient denies ear drainage. Left ear is asymptomatic. Patient also reports cough and rhinorrhea for the past several days that she attributes to cold weather.She states that she has used an ear wax candle to remove cerumen from her earand has also applied "numbing ear drops" to the ear without relief; patient does not know the name of the drops. She has a history of bursitis of hips bilaterally, anxiety and bipolar 1 disorder. Patient used to be a pain clinic client due to chronic pain associated with bursitis, but claims she was dropped due to insurance issues. Patient smokes cigarettes, but denies any alcohol use. LMP was 09/21/2012.  PCP - [facility]   Past Medical History  Diagnosis Date  . Bursitis of hip   . Hip joint pain   . Anxiety   . Chronic bronchitis   . Bipolar 1 disorder     Past Surgical History  Procedure Laterality Date  . Knee arthroscopy    . Cesarean section      2002  . Mouth surgery  2010    Family History  Problem Relation Age of Onset  . Hypertension Mother   . Diabetes Other   . Cancer  Other     History  Substance Use Topics  . Smoking status: Current Every Day Smoker -- 0.50 packs/day    Types: Cigarettes  . Smokeless tobacco: Never Used  . Alcohol Use: No    OB History   Grav Para Term Preterm Abortions TAB SAB Ect Mult Living   3 3              Review of Systems  HENT: Positive for ear pain and rhinorrhea. Negative for ear discharge.   Respiratory: Positive for cough.   All other systems reviewed and are negative.    Allergies  Cucumber extract; Ketorolac tromethamine; Macrobid; Naproxen; Penicillins; Watermelon concentrate; Darvocet; Flagyl; Ketorolac; Ultracet; Ultram; and Vicodin  Home Medications   Current Outpatient Rx  Name  Route  Sig  Dispense  Refill  . acetaminophen (TYLENOL) 325 MG tablet   Oral   Take 650 mg by mouth every 6 (six) hours as needed.         . ALPRAZolam (XANAX) 0.5 MG tablet   Oral   Take 0.5 mg by mouth at bedtime as needed.         . carbamazepine (TEGRETOL) 200 MG tablet   Oral   Take 200 mg by mouth daily.         . clonazePAM (KLONOPIN) 1 MG tablet  Oral   Take 1 mg by mouth daily.          . diphenhydrAMINE (BENADRYL) 25 MG tablet   Oral   Take 25 mg by mouth every 6 (six) hours as needed. allergies         . ibuprofen (ADVIL,MOTRIN) 200 MG tablet   Oral   Take 200 mg by mouth every 6 (six) hours as needed.         . meloxicam (MOBIC) 15 MG tablet   Oral   Take 15 mg by mouth daily.         Marland Kitchen oxyCODONE-acetaminophen (PERCOCET) 10-325 MG per tablet   Oral   Take 1 tablet by mouth 3 (three) times daily.          Marland Kitchen oxyCODONE-acetaminophen (PERCOCET/ROXICET) 5-325 MG per tablet   Oral   Take 2 tablets by mouth every 4 (four) hours as needed for pain.   20 tablet   0   . Phenylephrine-DM-GG (TUSSIN CF PO)   Oral   Take 10 mLs by mouth daily as needed. Patient used this medication for her cold symptoms.           Triage Vitals: BP 118/71  Pulse 84  Temp(Src) 98 F (36.7 C)  (Oral)  Resp 18  Ht 5\' 6"  (1.676 m)  Wt 105 lb (47.628 kg)  BMI 16.96 kg/m2  SpO2 98%  Physical Exam  Constitutional: She is oriented to person, place, and time. She appears well-developed and well-nourished. No distress.  Eyes: Conjunctivae and EOM are normal. Pupils are equal, round, and reactive to light.  Neck: Normal range of motion. Neck supple.  Cardiovascular: Normal rate, regular rhythm and normal heart sounds.   No murmur heard. Pulmonary/Chest: Effort normal and breath sounds normal. She has no rales.  Neurological: She is alert and oriented to person, place, and time.  Skin: Skin is warm and dry. No rash noted.   Wax removed from right ear with lighted curette to visualize right tm ED Course  Procedures (including critical care time) DIAGNOSTIC STUDIES: Oxygen Saturation is 98% on room air, normal by my interpretation.    COORDINATION OF CARE: 4:51 PM- Patient informed of current plan for treatment and evaluation and agrees with plan at this time.      Labs Reviewed - No data to display No results found.   No diagnosis found.    MDM  I personally performed the services described in this documentation, which was scribed in my presence. The recorded information has been reviewed and considered.   Jamie Quarry, MD 10/02/12 [phone]

## 2012-10-02 NOTE — ED Notes (Signed)
Right ear pain x 1 week 

## 2012-10-21 ENCOUNTER — Emergency Department (HOSPITAL_BASED_OUTPATIENT_CLINIC_OR_DEPARTMENT_OTHER): Payer: Medicaid Other

## 2012-10-21 ENCOUNTER — Encounter (HOSPITAL_BASED_OUTPATIENT_CLINIC_OR_DEPARTMENT_OTHER): Payer: Self-pay

## 2012-10-21 ENCOUNTER — Emergency Department (HOSPITAL_BASED_OUTPATIENT_CLINIC_OR_DEPARTMENT_OTHER)
Admission: EM | Admit: 2012-10-21 | Discharge: 2012-10-21 | Disposition: A | Discharge status: Home / Self Care | Payer: Medicaid Other | Attending: Emergency Medicine | Admitting: Emergency Medicine

## 2012-10-21 DIAGNOSIS — F17200 Nicotine dependence, unspecified, uncomplicated: Secondary | ICD-10-CM | POA: Insufficient documentation

## 2012-10-21 DIAGNOSIS — Z79899 Other long term (current) drug therapy: Secondary | ICD-10-CM | POA: Insufficient documentation

## 2012-10-21 DIAGNOSIS — Z8739 Personal history of other diseases of the musculoskeletal system and connective tissue: Secondary | ICD-10-CM | POA: Insufficient documentation

## 2012-10-21 DIAGNOSIS — J329 Chronic sinusitis, unspecified: Secondary | ICD-10-CM | POA: Insufficient documentation

## 2012-10-21 DIAGNOSIS — Z8709 Personal history of other diseases of the respiratory system: Secondary | ICD-10-CM | POA: Insufficient documentation

## 2012-10-21 DIAGNOSIS — J310 Chronic rhinitis: Secondary | ICD-10-CM | POA: Insufficient documentation

## 2012-10-21 DIAGNOSIS — J3489 Other specified disorders of nose and nasal sinuses: Secondary | ICD-10-CM | POA: Insufficient documentation

## 2012-10-21 DIAGNOSIS — F411 Generalized anxiety disorder: Secondary | ICD-10-CM | POA: Insufficient documentation

## 2012-10-21 DIAGNOSIS — F319 Bipolar disorder, unspecified: Secondary | ICD-10-CM | POA: Insufficient documentation

## 2012-10-21 LAB — CBC WITH DIFFERENTIAL/PLATELET
Basophils Absolute: 0.1 10*3/uL (ref 0.0–0.1)
HCT: 34.8 % — ABNORMAL LOW (ref 36.0–46.0)
Lymphocytes Relative: 37 % (ref 12–46)
Neutro Abs: 4.4 10*3/uL (ref 1.7–7.7)
Platelets: 358 10*3/uL (ref 150–400)
RDW: 11.8 % (ref 11.5–15.5)
WBC: 8.1 10*3/uL (ref 4.0–10.5)

## 2012-10-21 LAB — BASIC METABOLIC PANEL
CO2: 24 mEq/L (ref 19–32)
Chloride: 101 mEq/L (ref 96–112)
Potassium: 4.9 mEq/L (ref 3.5–5.1)
Sodium: 137 mEq/L (ref 135–145)

## 2012-10-21 MED ORDER — SODIUM CHLORIDE 0.9 % IV SOLN
INTRAVENOUS | Status: DC
  Administered 2012-10-21: 17:00:00 via INTRAVENOUS

## 2012-10-21 MED ORDER — CEPHALEXIN 500 MG PO CAPS: 500.0000 mg | ORAL_CAPSULE | Freq: Four times a day (QID) | ORAL | Status: DC

## 2012-10-21 MED ORDER — IOHEXOL 350 MG/ML SOLN
100.0000 mL | Freq: Once | INTRAVENOUS | Status: AC | PRN
  Administered 2012-10-21: 100 mL via INTRAVENOUS

## 2012-10-21 MED ORDER — ONDANSETRON HCL 4 MG/2ML IJ SOLN
4.0000 mg | Freq: Once | INTRAMUSCULAR | Status: AC
  Administered 2012-10-21: 4 mg via INTRAVENOUS
  Filled 2012-10-21: qty 2

## 2012-10-21 MED ORDER — ETODOLAC 500 MG PO TABS: 500.0000 mg | ORAL_TABLET | Freq: Two times a day (BID) | ORAL | Status: DC

## 2012-10-21 MED ORDER — DEXTROSE 5 % IV SOLN
1.0000 g | Freq: Once | INTRAVENOUS | Status: AC
  Administered 2012-10-21: 1 g via INTRAVENOUS
  Filled 2012-10-21: qty 10

## 2012-10-21 MED ORDER — MORPHINE SULFATE 4 MG/ML IJ SOLN
4.0000 mg | Freq: Once | INTRAMUSCULAR | Status: AC
  Administered 2012-10-21: 4 mg via INTRAVENOUS
  Filled 2012-10-21: qty 1

## 2012-10-21 MED ORDER — ONDANSETRON 8 MG PO TBDP: 8.0000 mg | ORAL_TABLET | Freq: Three times a day (TID) | ORAL | Status: DC | PRN

## 2012-10-21 NOTE — ED Notes (Signed)
Pt states that two days ago she had onset of facial swelling and tenderness to the R side of her face.  Pt presents with minor R orbital swelling, states that even her scalp is swollen.  Pt has been taking several OTC medications for her symptoms with no relief.

## 2012-10-21 NOTE — ED Provider Notes (Signed)
History    CSN: [id number] Arrival date & time 10/21/12  1344 First MD Initiated Contact with Patient 10/21/12 1504      Chief Complaint  Patient presents with  . Facial Swelling    HPI Pt started complaining of severe pain and swelling on the right side of the face not relieved by tylenol.  It started two days ago.  Since then she has felt like it has worsened.  The whole right side of her face and scalp feel tender and swollen.  She has not noticed any rashes but her eyelid does feel swollen.  No blurred vision. No fever.  She has some dental pain but she has dentures and no real teeth.  Some sinus congestion and rhinitis.  NO earache.   Past Medical History  Diagnosis Date  . Bursitis of hip   . Hip joint pain   . Anxiety   . Chronic bronchitis   . Bipolar 1 disorder     Past Surgical History  Procedure Laterality Date  . Knee arthroscopy    . Cesarean section      2002  . Mouth surgery  2010    Family History  Problem Relation Age of Onset  . Hypertension Mother   . Diabetes Other   . Cancer Other     History  Substance Use Topics  . Smoking status: Current Every Day Smoker -- 0.50 packs/day    Types: Cigarettes  . Smokeless tobacco: Never Used  . Alcohol Use: No    OB History   Grav Para Term Preterm Abortions TAB SAB Ect Mult Living   3 3              Review of Systems  Allergies  Cucumber extract; Ketorolac tromethamine; Macrobid; Naproxen; Penicillins; Watermelon concentrate; Darvocet; Flagyl; Ketorolac; Ultracet; Ultram; and Vicodin  Home Medications   Current Outpatient Rx  Name  Route  Sig  Dispense  Refill  . acetaminophen (TYLENOL) 325 MG tablet   Oral   Take 650 mg by mouth every 6 (six) hours as needed.         . ALPRAZolam (XANAX) 0.5 MG tablet   Oral   Take 0.5 mg by mouth at bedtime as needed.         . carbamazepine (TEGRETOL) 200 MG tablet   Oral   Take 200 mg by mouth daily.         . clonazePAM (KLONOPIN) 1 MG  tablet   Oral   Take 1 mg by mouth daily.          Marland Kitchen ibuprofen (ADVIL,MOTRIN) 200 MG tablet   Oral   Take 200 mg by mouth every 6 (six) hours as needed.         . cefdinir (OMNICEF) 300 MG capsule   Oral   Take 1 capsule (300 mg total) by mouth 2 (two) times daily.   20 capsule   0   . cephALEXin (KEFLEX) 500 MG capsule   Oral   Take 1 capsule (500 mg total) by mouth 4 (four) times daily.   40 capsule   0   . diphenhydrAMINE (BENADRYL) 25 MG tablet   Oral   Take 25 mg by mouth every 6 (six) hours as needed. allergies         . etodolac (LODINE) 500 MG tablet   Oral   Take 1 tablet (500 mg total) by mouth 2 (two) times daily.   20 tablet   0   .  meloxicam (MOBIC) 15 MG tablet   Oral   Take 15 mg by mouth daily.         . ondansetron (ZOFRAN ODT) 8 MG disintegrating tablet   Oral   Take 1 tablet (8 mg total) by mouth every 8 (eight) hours as needed for nausea.   20 tablet   0   . oxyCODONE-acetaminophen (PERCOCET) 10-325 MG per tablet   Oral   Take 1 tablet by mouth 3 (three) times daily.          Marland Kitchen oxyCODONE-acetaminophen (PERCOCET/ROXICET) 5-325 MG per tablet   Oral   Take 2 tablets by mouth every 4 (four) hours as needed for pain.   20 tablet   0   . Phenylephrine-DM-GG (TUSSIN CF PO)   Oral   Take 10 mLs by mouth daily as needed. Patient used this medication for her cold symptoms.           BP 129/96  Pulse 90  Temp(Src) 98.5 F (36.9 C) (Oral)  Resp 18  Ht 5\' 6"  (1.676 m)  Wt 105 lb (47.628 kg)  BMI 16.96 kg/m2  SpO2 98%  LMP 10/17/2012  Physical Exam  Nursing note and vitals reviewed. Constitutional: No distress.  HENT:  Head: Normocephalic and atraumatic.  Right Ear: Tympanic membrane, external ear and ear canal normal.  Left Ear: Tympanic membrane, external ear and ear canal normal.  Mouth/Throat: No oropharyngeal exudate.  M edema right eyelid with mild erythema, ttp right scalp and face, no vesicular lesions or rash noted.   No scalp swelling noted  Eyes: Conjunctivae are normal. Right eye exhibits no discharge. Left eye exhibits no discharge. No scleral icterus.  Neck: Normal range of motion. Neck supple. No tracheal deviation present.  Cardiovascular: Normal rate, regular rhythm and intact distal pulses.   Pulmonary/Chest: Effort normal and breath sounds normal. No stridor. No respiratory distress. She has no wheezes. She has no rales.  Abdominal: Soft. Bowel sounds are normal. She exhibits no distension. There is no tenderness. There is no rebound and no guarding.  Musculoskeletal: She exhibits no edema and no tenderness.  Neurological: She is alert. She has normal strength. No sensory deficit. Cranial nerve deficit:  no gross defecits noted. She exhibits normal muscle tone. She displays no seizure activity. Coordination normal.  Skin: Skin is warm and dry. No rash noted.  Psychiatric: She has a normal mood and affect.    ED Course  Procedures (including critical care time)  Labs Reviewed  CBC WITH DIFFERENTIAL - Abnormal; Notable for the following:    RBC 3.77 (*)    Hemoglobin 11.7 (*)    HCT 34.8 (*)    All other components within normal limits  BASIC METABOLIC PANEL   Ct Maxillofacial W/cm  10/21/2012  *RADIOLOGY REPORT*  Clinical Data: Right facial swelling at J, region for 2 days  CT MAXILLOFACIAL WITH CONTRAST  Technique:  Multidetector CT imaging of the maxillofacial structures was performed with intravenous contrast. Multiplanar CT image reconstructions were also generated. The right side of face marked with a vitamin E capsule.  Contrast: OMNIPAQUE IOHEXOL 350 MG/ML SOLN  Comparison: CT head 07/29/2011  Findings: Visualized intracranial structures unremarkable. Intraorbital soft tissue planes clear. Mild right periorbital soft tissue swelling. Mild mucosal thickening in right maxillary and frontal sinuses and a few of the right ethmoid air cells, minimally in left maxillary sinus and sphenoid  sinus. Partial opacification of right mastoid air cells. No significant sinus air-fluid levels identified.  Middle ear cavities clear bilaterally. No facial soft tissue abscess identified. Question mucus within right maxillary sinus. Mild nasal septal deviation to the left. No fracture or bone destruction.  IMPRESSION: Sinus disease changes involving the right frontal, right maxillary, right ethmoid, and to a lesser degree left side sinuses. Partial opacification of the right mastoid air cells question mastoiditis. No evidence of facial soft tissue abscess.   Original Report Authenticated By: Ulyses Southward, M.D.      1. Sinusitis       MDM  Patient's facial swelling and tenderness seem to be due to a sinusitis. She no evidence of abscess. She certainly could have a component of a facial cellulitis but the exam is not definitive for that.  Will start the patient on Keflex. She has tolerated Rocephin here in the emergency department and her allergy to penicillin his nausea. He was given a prescription for medications for pain as well        Celene Kras, MD 10/21/12 1726

## 2013-01-16 ENCOUNTER — Emergency Department (HOSPITAL_BASED_OUTPATIENT_CLINIC_OR_DEPARTMENT_OTHER): Payer: Medicaid Other

## 2013-01-16 ENCOUNTER — Emergency Department (HOSPITAL_BASED_OUTPATIENT_CLINIC_OR_DEPARTMENT_OTHER)
Admission: EM | Admit: 2013-01-16 | Discharge: 2013-01-16 | Disposition: A | Discharge status: Home / Self Care | Payer: Medicaid Other | Attending: Emergency Medicine | Admitting: Emergency Medicine

## 2013-01-16 ENCOUNTER — Encounter (HOSPITAL_BASED_OUTPATIENT_CLINIC_OR_DEPARTMENT_OTHER): Payer: Self-pay

## 2013-01-16 DIAGNOSIS — Z79899 Other long term (current) drug therapy: Secondary | ICD-10-CM | POA: Insufficient documentation

## 2013-01-16 DIAGNOSIS — F319 Bipolar disorder, unspecified: Secondary | ICD-10-CM | POA: Insufficient documentation

## 2013-01-16 DIAGNOSIS — J069 Acute upper respiratory infection, unspecified: Secondary | ICD-10-CM | POA: Insufficient documentation

## 2013-01-16 DIAGNOSIS — F17200 Nicotine dependence, unspecified, uncomplicated: Secondary | ICD-10-CM | POA: Insufficient documentation

## 2013-01-16 DIAGNOSIS — J209 Acute bronchitis, unspecified: Secondary | ICD-10-CM | POA: Insufficient documentation

## 2013-01-16 DIAGNOSIS — J449 Chronic obstructive pulmonary disease, unspecified: Secondary | ICD-10-CM | POA: Insufficient documentation

## 2013-01-16 DIAGNOSIS — J4489 Other specified chronic obstructive pulmonary disease: Secondary | ICD-10-CM | POA: Insufficient documentation

## 2013-01-16 DIAGNOSIS — J42 Unspecified chronic bronchitis: Secondary | ICD-10-CM

## 2013-01-16 DIAGNOSIS — Z880 Allergy status to penicillin: Secondary | ICD-10-CM | POA: Insufficient documentation

## 2013-01-16 DIAGNOSIS — Z8739 Personal history of other diseases of the musculoskeletal system and connective tissue: Secondary | ICD-10-CM | POA: Insufficient documentation

## 2013-01-16 DIAGNOSIS — F411 Generalized anxiety disorder: Secondary | ICD-10-CM | POA: Insufficient documentation

## 2013-01-16 DIAGNOSIS — R05 Cough: Secondary | ICD-10-CM | POA: Insufficient documentation

## 2013-01-16 DIAGNOSIS — J3489 Other specified disorders of nose and nasal sinuses: Secondary | ICD-10-CM | POA: Insufficient documentation

## 2013-01-16 DIAGNOSIS — R059 Cough, unspecified: Secondary | ICD-10-CM | POA: Insufficient documentation

## 2013-01-16 MED ORDER — HYDROCOD POLST-CHLORPHEN POLST 10-8 MG/5ML PO LQCR: 5.0000 mL | Freq: Two times a day (BID) | ORAL | Status: DC | PRN

## 2013-01-16 MED ORDER — PROMETHAZINE HCL 25 MG PO TABS: 25.0000 mg | ORAL_TABLET | Freq: Four times a day (QID) | ORAL | Status: DC | PRN

## 2013-01-16 MED ORDER — AZITHROMYCIN 250 MG PO TABS: ORAL_TABLET | ORAL | Status: DC

## 2013-01-16 NOTE — ED Provider Notes (Signed)
History    CSN: [id number] Arrival date & time 01/16/13  1446  First MD Initiated Contact with Patient 01/16/13 1524     Chief Complaint  Patient presents with  . Cough   (Consider location/radiation/quality/duration/timing/severity/associated sxs/prior Treatment) HPI Comments: Patient is a 32 year old female with history of COPD who presents today with cough for 1 month. It has been gradually worsening. She has not taken any OTC medications for her symptoms. The cough is worse at night when she is laying down. She also has pain on her sides, worse when she breaths in and when she coughs. This started within the last week. It hurts more to touch her sides. She has associated nasal congestion. She smokes 1/2 a pack a day. She has taken Ambien to help her sleep at night. She denies fevers, chills, nausea, vomiting, abdominal pain, headache.   Patient is a 32 y.o. female presenting with cough. The history is provided by the patient. No language interpreter was used.  Cough Associated symptoms: no chest pain, no chills, no fever and no shortness of breath    Past Medical History  Diagnosis Date  . Bursitis of hip   . Hip joint pain   . Anxiety   . Chronic bronchitis   . Bipolar 1 disorder    Past Surgical History  Procedure Laterality Date  . Knee arthroscopy    . Cesarean section      2002  . Mouth surgery  2010   Family History  Problem Relation Age of Onset  . Hypertension Mother   . Diabetes Other   . Cancer Other    History  Substance Use Topics  . Smoking status: Current Every Day Smoker -- 0.50 packs/day    Types: Cigarettes  . Smokeless tobacco: Never Used  . Alcohol Use: Yes     Comment: rarely   OB History   Grav Para Term Preterm Abortions TAB SAB Ect Mult Living   3 3             Review of Systems  Constitutional: Negative for fever and chills.  HENT: Positive for congestion.   Respiratory: Positive for cough. Negative for shortness of breath.    Cardiovascular: Negative for chest pain.  Gastrointestinal: Negative for vomiting and abdominal pain.  All other systems reviewed and are negative.    Allergies  Cucumber extract; Keflex; Ketorolac tromethamine; Macrobid; Naproxen; Penicillins; Watermelon concentrate; Darvocet; Flagyl; Ketorolac; Ultracet; Ultram; and Vicodin  Home Medications   Current Outpatient Rx  Name  Route  Sig  Dispense  Refill  . ALPRAZolam (XANAX) 0.5 MG tablet   Oral   Take 0.5 mg by mouth at bedtime as needed.         . carbamazepine (TEGRETOL) 200 MG tablet   Oral   Take 200 mg by mouth daily.          BP 134/82  Pulse 65  Resp 20  SpO2 99%  LMP 01/16/2013  Physical Exam  Nursing note and vitals reviewed. Constitutional: She is oriented to person, place, and time. Vital signs are normal. She appears well-developed and well-nourished. She does not appear ill. No distress.  HENT:  Head: Normocephalic and atraumatic.  Right Ear: Tympanic membrane, external ear and ear canal normal.  Left Ear: Tympanic membrane, external ear and ear canal normal.  Nose: Nose normal. Right sinus exhibits no maxillary sinus tenderness and no frontal sinus tenderness. Left sinus exhibits no maxillary sinus tenderness and no frontal  sinus tenderness.  Mouth/Throat: Oropharynx is clear and moist.  Eyes: Conjunctivae are normal.  Neck: Trachea normal, normal range of motion and phonation normal. No rigidity.  Cardiovascular: Normal rate, regular rhythm, normal heart sounds, intact distal pulses and normal pulses.   Pulmonary/Chest: Effort normal and breath sounds normal. No stridor. No respiratory distress. She has no decreased breath sounds. She has no wheezes. She has no rales. She exhibits tenderness. She exhibits no deformity, no swelling and no retraction.  Mild tenderness to palpation over lateral sides of chest  Abdominal: Soft. Normal appearance. She exhibits no distension. There is no tenderness. There is  no rigidity and no guarding.  Musculoskeletal: Normal range of motion.  Neurological: She is alert and oriented to person, place, and time. She has normal strength. Coordination and gait normal.  Skin: Skin is warm and dry. She is not diaphoretic. No erythema.  Psychiatric: She has a normal mood and affect. Her behavior is normal.    ED Course  Procedures (including critical care time) Labs Reviewed - No data to display  Dg Chest 2 View  01/16/2013   *RADIOLOGY REPORT*  Clinical Data: Cough for 45 days.  Smoker.  CHEST - 2 VIEW  Comparison: 07/24/2011  Findings: Patient minimally rotated left.  Minimal convex right thoracic spine curvature. Midline trachea.  Normal heart size and mediastinal contours. No pleural effusion or pneumothorax.  Mild interstitial thickening which is chronic. Clear lungs.  IMPRESSION: No acute cardiopulmonary disease.  Interstitial thickening, likely related to smoking / chronic bronchitis.   Original Report Authenticated By: Jeronimo Greaves, M.D.   1. Cough   2. Upper respiratory infection   3. Chronic bronchitis     MDM  Patient presents with cough x 1 month. She is a smoker and has chronic bronchitis. Lungs were clear to auscultation, O2 sats remained 99% on RA through course of ED stay. PERC negative. Z-pack given as she is higher risk being a smoker with lung disease and cough has been present > 1 month. Strict return instructions given. Follow up with your PCP. Vital signs stable for discharge. Patient / Family / Caregiver informed of clinical course, understand medical decision-making process, and agree with plan.   Mora Bellman, PA-C 01/16/13 1645

## 2013-01-16 NOTE — ED Provider Notes (Signed)
Medical screening examination/treatment/procedure(s) were performed by non-physician practitioner and as supervising physician I was immediately available for consultation/collaboration.   Curtistine Pettitt, MD 01/16/13 1951 

## 2013-01-16 NOTE — ED Notes (Signed)
Cough x 1 month

## 2013-02-27 ENCOUNTER — Emergency Department (HOSPITAL_BASED_OUTPATIENT_CLINIC_OR_DEPARTMENT_OTHER)
Admission: EM | Admit: 2013-02-27 | Discharge: 2013-02-27 | Disposition: A | Discharge status: Home / Self Care | Payer: Medicaid Other | Attending: Emergency Medicine | Admitting: Emergency Medicine

## 2013-02-27 ENCOUNTER — Emergency Department (HOSPITAL_BASED_OUTPATIENT_CLINIC_OR_DEPARTMENT_OTHER): Payer: Medicaid Other

## 2013-02-27 ENCOUNTER — Encounter (HOSPITAL_BASED_OUTPATIENT_CLINIC_OR_DEPARTMENT_OTHER): Payer: Self-pay | Admitting: *Deleted

## 2013-02-27 DIAGNOSIS — F17200 Nicotine dependence, unspecified, uncomplicated: Secondary | ICD-10-CM | POA: Insufficient documentation

## 2013-02-27 DIAGNOSIS — W208XXA Other cause of strike by thrown, projected or falling object, initial encounter: Secondary | ICD-10-CM | POA: Insufficient documentation

## 2013-02-27 DIAGNOSIS — F411 Generalized anxiety disorder: Secondary | ICD-10-CM | POA: Insufficient documentation

## 2013-02-27 DIAGNOSIS — F319 Bipolar disorder, unspecified: Secondary | ICD-10-CM | POA: Insufficient documentation

## 2013-02-27 DIAGNOSIS — S9030XA Contusion of unspecified foot, initial encounter: Secondary | ICD-10-CM | POA: Insufficient documentation

## 2013-02-27 DIAGNOSIS — S9032XA Contusion of left foot, initial encounter: Secondary | ICD-10-CM

## 2013-02-27 DIAGNOSIS — Z8739 Personal history of other diseases of the musculoskeletal system and connective tissue: Secondary | ICD-10-CM | POA: Insufficient documentation

## 2013-02-27 DIAGNOSIS — Z880 Allergy status to penicillin: Secondary | ICD-10-CM | POA: Insufficient documentation

## 2013-02-27 DIAGNOSIS — Z8709 Personal history of other diseases of the respiratory system: Secondary | ICD-10-CM | POA: Insufficient documentation

## 2013-02-27 DIAGNOSIS — Y9389 Activity, other specified: Secondary | ICD-10-CM | POA: Insufficient documentation

## 2013-02-27 DIAGNOSIS — Z79899 Other long term (current) drug therapy: Secondary | ICD-10-CM | POA: Insufficient documentation

## 2013-02-27 DIAGNOSIS — Y929 Unspecified place or not applicable: Secondary | ICD-10-CM | POA: Insufficient documentation

## 2013-02-27 MED ORDER — OXYCODONE-ACETAMINOPHEN 5-325 MG PO TABS: 2.0000 | ORAL_TABLET | ORAL | Status: DC | PRN

## 2013-02-27 NOTE — ED Provider Notes (Signed)
Medical screening examination/treatment/procedure(s) were performed by non-physician practitioner and as supervising physician I was immediately available for consultation/collaboration.   Dagmar Hait, MD 02/27/13 2322

## 2013-02-27 NOTE — ED Notes (Signed)
Dropped a bottle on her foot. Left foot is painful.

## 2013-02-27 NOTE — ED Provider Notes (Signed)
CSN: [id number]     Arrival date & time 02/27/13  1754 History     First MD Initiated Contact with Patient 02/27/13 1801     Chief Complaint  Patient presents with  . Foot Injury   (Consider location/radiation/quality/duration/timing/severity/associated sxs/prior Treatment) Patient is a 32 y.o. female presenting with lower extremity pain. The history is provided by the patient. No language interpreter was used.  Foot Pain This is a new problem. The current episode started today. The problem occurs constantly. The problem has been gradually worsening. Associated symptoms include joint swelling. Nothing aggravates the symptoms. She has tried nothing for the symptoms. The treatment provided moderate relief.  Pt complains of pain in her foot after dropping a bottle on her foot  Past Medical History  Diagnosis Date  . Bursitis of hip   . Hip joint pain   . Anxiety   . Chronic bronchitis   . Bipolar 1 disorder    Past Surgical History  Procedure Laterality Date  . Knee arthroscopy    . Cesarean section      2002  . Mouth surgery  2010   Family History  Problem Relation Age of Onset  . Hypertension Mother   . Diabetes Other   . Cancer Other    History  Substance Use Topics  . Smoking status: Current Every Day Smoker -- 0.50 packs/day    Types: Cigarettes  . Smokeless tobacco: Never Used  . Alcohol Use: Yes     Comment: rarely   OB History   Grav Para Term Preterm Abortions TAB SAB Ect Mult Living   3 3             Review of Systems  Musculoskeletal: Positive for joint swelling.  Skin: Positive for wound.  All other systems reviewed and are negative.    Allergies  Cucumber extract; Keflex; Ketorolac tromethamine; Macrobid; Naproxen; Penicillins; Watermelon concentrate; Darvocet; Flagyl; Ketorolac; Ultracet; Ultram; and Vicodin  Home Medications   Current Outpatient Rx  Name  Route  Sig  Dispense  Refill  . busPIRone (BUSPAR) 15 MG tablet   Oral   Take 15 mg by  mouth 3 (three) times daily.         Marland Kitchen ALPRAZolam (XANAX) 0.5 MG tablet   Oral   Take 0.5 mg by mouth at bedtime as needed.         Marland Kitchen azithromycin (ZITHROMAX Z-PAK) 250 MG tablet      2 po day one, then 1 daily x 4 days   5 tablet   0   . carbamazepine (TEGRETOL) 200 MG tablet   Oral   Take 200 mg by mouth daily.         . chlorpheniramine-HYDROcodone (TUSSIONEX PENNKINETIC ER) 10-8 MG/5ML LQCR   Oral   Take 5 mLs by mouth every 12 (twelve) hours as needed (Cough).   115 mL   0   . oxyCODONE-acetaminophen (PERCOCET/ROXICET) 5-325 MG per tablet   Oral   Take 2 tablets by mouth every 4 (four) hours as needed for pain.   12 tablet   0   . promethazine (PHENERGAN) 25 MG tablet   Oral   Take 1 tablet (25 mg total) by mouth every 6 (six) hours as needed for nausea.   12 tablet   0    BP 140/90  Pulse 102  Temp(Src) 98 F (36.7 C) (Oral)  Resp 20  Wt 105 lb (47.628 kg)  BMI 16.96 kg/m2  SpO2 99% Physical  Exam  Constitutional: She is oriented to person, place, and time. She appears well-developed and well-nourished.  Musculoskeletal: She exhibits tenderness.  Bruised left foot,  Tender to palpation  Neurological: She is alert and oriented to person, place, and time. She has normal reflexes.  Skin: Skin is warm.  Psychiatric: She has a normal mood and affect.    ED Course   Procedures (including critical care time)  Labs Reviewed - No data to display Dg Foot Complete Left  02/27/2013   *RADIOLOGY REPORT*  Clinical Data: Dorsal foot pain, trauma,  LEFT FOOT - COMPLETE 3+ VIEW  Comparison: 02/22/2011  Findings: Normal alignment without fracture.  Preserved joint spaces.  No soft tissue abnormality.  IMPRESSION: No acute osseous finding   Original Report Authenticated By: Judie Petit. Shick, M.D.   1. Contusion, foot, left, initial encounter     MDM    Elson Areas, PA-C 02/27/13 2025

## 2013-04-01 ENCOUNTER — Emergency Department (HOSPITAL_BASED_OUTPATIENT_CLINIC_OR_DEPARTMENT_OTHER): Payer: Medicaid Other

## 2013-04-01 ENCOUNTER — Emergency Department (HOSPITAL_BASED_OUTPATIENT_CLINIC_OR_DEPARTMENT_OTHER)
Admission: EM | Admit: 2013-04-01 | Discharge: 2013-04-01 | Disposition: A | Discharge status: Home / Self Care | Payer: Medicaid Other | Attending: Emergency Medicine | Admitting: Emergency Medicine

## 2013-04-01 ENCOUNTER — Encounter (HOSPITAL_BASED_OUTPATIENT_CLINIC_OR_DEPARTMENT_OTHER): Payer: Self-pay | Admitting: *Deleted

## 2013-04-01 DIAGNOSIS — Z8739 Personal history of other diseases of the musculoskeletal system and connective tissue: Secondary | ICD-10-CM | POA: Insufficient documentation

## 2013-04-01 DIAGNOSIS — F17200 Nicotine dependence, unspecified, uncomplicated: Secondary | ICD-10-CM | POA: Insufficient documentation

## 2013-04-01 DIAGNOSIS — Z8709 Personal history of other diseases of the respiratory system: Secondary | ICD-10-CM | POA: Insufficient documentation

## 2013-04-01 DIAGNOSIS — Z880 Allergy status to penicillin: Secondary | ICD-10-CM | POA: Insufficient documentation

## 2013-04-01 DIAGNOSIS — Z79899 Other long term (current) drug therapy: Secondary | ICD-10-CM | POA: Insufficient documentation

## 2013-04-01 DIAGNOSIS — Y939 Activity, unspecified: Secondary | ICD-10-CM | POA: Insufficient documentation

## 2013-04-01 DIAGNOSIS — Y929 Unspecified place or not applicable: Secondary | ICD-10-CM | POA: Insufficient documentation

## 2013-04-01 DIAGNOSIS — W208XXA Other cause of strike by thrown, projected or falling object, initial encounter: Secondary | ICD-10-CM | POA: Insufficient documentation

## 2013-04-01 DIAGNOSIS — F411 Generalized anxiety disorder: Secondary | ICD-10-CM | POA: Insufficient documentation

## 2013-04-01 DIAGNOSIS — F319 Bipolar disorder, unspecified: Secondary | ICD-10-CM | POA: Insufficient documentation

## 2013-04-01 DIAGNOSIS — S62639A Displaced fracture of distal phalanx of unspecified finger, initial encounter for closed fracture: Secondary | ICD-10-CM | POA: Insufficient documentation

## 2013-04-01 DIAGNOSIS — S62318A Displaced fracture of base of other metacarpal bone, initial encounter for closed fracture: Secondary | ICD-10-CM

## 2013-04-01 MED ORDER — OXYCODONE-ACETAMINOPHEN 5-325 MG PO TABS
2.0000 | ORAL_TABLET | Freq: Once | ORAL | Status: AC
  Administered 2013-04-01: 2 via ORAL
  Filled 2013-04-01 (×2): qty 2

## 2013-04-01 MED ORDER — OXYCODONE-ACETAMINOPHEN 5-325 MG PO TABS: 1.0000 | ORAL_TABLET | Freq: Four times a day (QID) | ORAL | Status: DC | PRN

## 2013-04-01 NOTE — ED Notes (Signed)
Pt reports an alternator fell on her right hand yesterday. C/o right hand pain and swelling

## 2013-04-01 NOTE — ED Provider Notes (Signed)
CSN: [id number]     Arrival date & time 04/01/13  1845 History   First MD Initiated Contact with Patient 04/01/13 1859     Chief Complaint  Patient presents with  . Hand Injury   (Consider location/radiation/quality/duration/timing/severity/associated sxs/prior Treatment) HPI Comments: Patient is a 32 year old female who presents for right hand pain with onset 3 days ago. Patient states that pain began after an alternator fell on her right hand. Patient states the pain has been constant and aching. Pain is nonradiating. She has been trying Tylenol for the pain without relief. Patient states the pain is worse with movement and palpation. She admits to swelling of her R hand and denies associated pallor, erythema, and numbness/tingling.  Patient is a 32 y.o. female presenting with hand injury. The history is provided by the patient. No language interpreter was used.  Hand Injury Associated symptoms: no fever     Past Medical History  Diagnosis Date  . Bursitis of hip   . Hip joint pain   . Anxiety   . Chronic bronchitis   . Bipolar 1 disorder    Past Surgical History  Procedure Laterality Date  . Knee arthroscopy    . Cesarean section      2002  . Mouth surgery  2010   Family History  Problem Relation Age of Onset  . Hypertension Mother   . Diabetes Other   . Cancer Other    History  Substance Use Topics  . Smoking status: Current Every Day Smoker -- 0.50 packs/day    Types: Cigarettes  . Smokeless tobacco: Never Used  . Alcohol Use: Yes     Comment: rarely   OB History   Grav Para Term Preterm Abortions TAB SAB Ect Mult Living   3 3             Review of Systems  Constitutional: Negative for fever.  Musculoskeletal: Positive for joint swelling and arthralgias.  Skin: Negative for pallor.  Neurological: Negative for weakness and numbness.  All other systems reviewed and are negative.   Allergies  Cucumber extract; Keflex; Ketorolac tromethamine; Macrobid;  Naproxen; Penicillins; Watermelon concentrate; Darvocet; Flagyl; Ketorolac; Ultracet; Ultram; and Vicodin  Home Medications   Current Outpatient Rx  Name  Route  Sig  Dispense  Refill  . ALPRAZolam (XANAX) 0.5 MG tablet   Oral   Take 0.5 mg by mouth at bedtime as needed.         Marland Kitchen azithromycin (ZITHROMAX Z-PAK) 250 MG tablet      2 po day one, then 1 daily x 4 days   5 tablet   0   . busPIRone (BUSPAR) 15 MG tablet   Oral   Take 15 mg by mouth 3 (three) times daily.         . carbamazepine (TEGRETOL) 200 MG tablet   Oral   Take 200 mg by mouth daily.         . chlorpheniramine-HYDROcodone (TUSSIONEX PENNKINETIC ER) 10-8 MG/5ML LQCR   Oral   Take 5 mLs by mouth every 12 (twelve) hours as needed (Cough).   115 mL   0   . oxyCODONE-acetaminophen (PERCOCET/ROXICET) 5-325 MG per tablet   Oral   Take 1 tablet by mouth every 6 (six) hours as needed for pain.   11 tablet   0   . promethazine (PHENERGAN) 25 MG tablet   Oral   Take 1 tablet (25 mg total) by mouth every 6 (six) hours as needed for  nausea.   12 tablet   0    BP 137/92  Pulse 90  Temp(Src) 99.1 F (37.3 C) (Oral)  Resp 16  SpO2 95%  LMP 04/01/2013  Physical Exam  Nursing note and vitals reviewed. Constitutional: She is oriented to person, place, and time. She appears well-developed and well-nourished. No distress.  HENT:  Head: Normocephalic and atraumatic.  Eyes: Conjunctivae and EOM are normal. No scleral icterus.  Neck: Normal range of motion.  Cardiovascular: Normal rate, regular rhythm and intact distal pulses.   Distal radial pulses 2+ bilaterally. Capillary refill normal.  Pulmonary/Chest: Effort normal. No respiratory distress.  Musculoskeletal: Normal range of motion.       Right hand: She exhibits tenderness, bony tenderness and swelling. She exhibits normal capillary refill. Normal sensation noted. Normal strength noted.       Hands: Tenderness to palpation of the dorsal lateral  surface of the right hand as well as with palpation to the fourth and fifth MCP joints. Patient with 4/5 strength against resistance of her FDP, FDS, and extensors of her fourth and fifth digits. 5/5 strength against resistance of her FDP, FDS, and extensors of remaining fingers. Decreased grip strength in right hand secondary to poor effort and discomfort.  Neurological: She is alert and oriented to person, place, and time.  No sensory or motor deficits appreciated. Finger to thumb opposition intact.  Skin: Skin is warm and dry. No rash noted. She is not diaphoretic. No erythema. No pallor.  Psychiatric: She has a normal mood and affect. Her behavior is normal.    ED Course  Procedures (including critical care time) Labs Review Labs Reviewed - No data to display  Imaging Review Dg Hand Complete Right  04/01/2013   *RADIOLOGY REPORT*  Clinical Data: Blunt trauma, right hand pain  RIGHT HAND - COMPLETE 3+ VIEW  Comparison: None.  Findings: There is a fracture of the distal aspect of the fifth metacarpal with mild dorsal angulation.  IMPRESSION: Fracture of the distal aspect of the fifth metacarpal.   Original Report Authenticated By: Genevive Bi, M.D.   MDM   1. Fracture of fifth metacarpal bone, closed, initial encounter    32 year old female presents for right hand pain x3 days. Patient neurovascularly intact. Physical exam findings as above. Significant for decreased strength against resistance of FDP, FDS, and extensors of fourth and fifth digits. X-ray findings significant for fracture of the distal fifth metacarpal of the right hand. There is no pallor, pulselessness, poikilothermia, or paresthesias to suspect complicating injury. Ulnar gutter splint applied in the ED for stability. Percocet given for pain. Patient prepared for discharge with hand specialist followup for further evaluation of her fracture. Short course of Percocet prescribed for pain control and rest and ice recommended.  Return precautions advised and patient agreeable to plan with no unaddressed concerns.    Antony Madura, PA-C 04/01/13 2112

## 2013-04-01 NOTE — ED Notes (Signed)
Short arm-type fiberglass splint applied to right wrist.

## 2013-04-04 NOTE — ED Provider Notes (Signed)
Medical screening examination/treatment/procedure(s) were performed by non-physician practitioner and as supervising physician I was immediately available for consultation/collaboration.  Derwood Kaplan, MD 04/04/13 [phone]

## 2013-04-19 ENCOUNTER — Emergency Department (HOSPITAL_BASED_OUTPATIENT_CLINIC_OR_DEPARTMENT_OTHER)
Admission: EM | Admit: 2013-04-19 | Discharge: 2013-04-19 | Disposition: A | Discharge status: Home / Self Care | Payer: Medicaid Other | Attending: Emergency Medicine | Admitting: Emergency Medicine

## 2013-04-19 ENCOUNTER — Encounter (HOSPITAL_BASED_OUTPATIENT_CLINIC_OR_DEPARTMENT_OTHER): Payer: Self-pay | Admitting: *Deleted

## 2013-04-19 DIAGNOSIS — Z8739 Personal history of other diseases of the musculoskeletal system and connective tissue: Secondary | ICD-10-CM | POA: Insufficient documentation

## 2013-04-19 DIAGNOSIS — Z792 Long term (current) use of antibiotics: Secondary | ICD-10-CM | POA: Insufficient documentation

## 2013-04-19 DIAGNOSIS — Z79899 Other long term (current) drug therapy: Secondary | ICD-10-CM | POA: Insufficient documentation

## 2013-04-19 DIAGNOSIS — F319 Bipolar disorder, unspecified: Secondary | ICD-10-CM | POA: Insufficient documentation

## 2013-04-19 DIAGNOSIS — F17200 Nicotine dependence, unspecified, uncomplicated: Secondary | ICD-10-CM | POA: Insufficient documentation

## 2013-04-19 DIAGNOSIS — F411 Generalized anxiety disorder: Secondary | ICD-10-CM | POA: Insufficient documentation

## 2013-04-19 DIAGNOSIS — Z8709 Personal history of other diseases of the respiratory system: Secondary | ICD-10-CM | POA: Insufficient documentation

## 2013-04-19 DIAGNOSIS — Z880 Allergy status to penicillin: Secondary | ICD-10-CM | POA: Insufficient documentation

## 2013-04-19 DIAGNOSIS — S62308S Unspecified fracture of other metacarpal bone, sequela: Secondary | ICD-10-CM

## 2013-04-19 DIAGNOSIS — M79609 Pain in unspecified limb: Secondary | ICD-10-CM | POA: Insufficient documentation

## 2013-04-19 DIAGNOSIS — IMO0001 Reserved for inherently not codable concepts without codable children: Secondary | ICD-10-CM | POA: Insufficient documentation

## 2013-04-19 NOTE — ED Notes (Signed)
Pt attempted to see ortho as advised on discharge unable to get appointment still having pain here to have a recheck

## 2013-04-19 NOTE — ED Provider Notes (Signed)
CSN: [id number]     Arrival date & time 04/19/13  1210 History   First MD Initiated Contact with Patient 04/19/13 1231     Chief Complaint  Patient presents with  . recheck    (Consider location/radiation/quality/duration/timing/severity/associated sxs/prior Treatment) HPI Comments: Here for recheck of 5th metacarpal fracture. Couldn't get in to Orthopedics.  Patient is a 32 y.o. female presenting with hand pain. The history is provided by the patient.  Hand Pain This is a recurrent problem. The current episode started more than 1 week ago. The problem occurs constantly. The problem has been gradually improving. Pertinent negatives include no chest pain, no abdominal pain, no headaches and no shortness of breath. Nothing aggravates the symptoms. Nothing relieves the symptoms. Treatments tried: splint. The treatment provided mild relief.    Past Medical History  Diagnosis Date  . Bursitis of hip   . Hip joint pain   . Anxiety   . Chronic bronchitis   . Bipolar 1 disorder    Past Surgical History  Procedure Laterality Date  . Knee arthroscopy    . Cesarean section      2002  . Mouth surgery  2010   Family History  Problem Relation Age of Onset  . Hypertension Mother   . Diabetes Other   . Cancer Other    History  Substance Use Topics  . Smoking status: Current Every Day Smoker -- 0.50 packs/day    Types: Cigarettes  . Smokeless tobacco: Never Used  . Alcohol Use: Yes     Comment: rarely   OB History   Grav Para Term Preterm Abortions TAB SAB Ect Mult Living   3 3             Review of Systems  Constitutional: Negative for fever.  Respiratory: Negative for cough and shortness of breath.   Cardiovascular: Negative for chest pain.  Gastrointestinal: Negative for abdominal pain.  Neurological: Negative for headaches.  All other systems reviewed and are negative.    Allergies  Cucumber extract; Keflex; Ketorolac tromethamine; Macrobid; Naproxen; Penicillins;  Watermelon concentrate; Darvocet; Flagyl; Ketorolac; Ultracet; Ultram; and Vicodin  Home Medications   Current Outpatient Rx  Name  Route  Sig  Dispense  Refill  . ALPRAZolam (XANAX) 0.5 MG tablet   Oral   Take 0.5 mg by mouth at bedtime as needed.         Marland Kitchen azithromycin (ZITHROMAX Z-PAK) 250 MG tablet      2 po day one, then 1 daily x 4 days   5 tablet   0   . busPIRone (BUSPAR) 15 MG tablet   Oral   Take 15 mg by mouth 3 (three) times daily.         . carbamazepine (TEGRETOL) 200 MG tablet   Oral   Take 200 mg by mouth daily.         . chlorpheniramine-HYDROcodone (TUSSIONEX PENNKINETIC ER) 10-8 MG/5ML LQCR   Oral   Take 5 mLs by mouth every 12 (twelve) hours as needed (Cough).   115 mL   0   . oxyCODONE-acetaminophen (PERCOCET/ROXICET) 5-325 MG per tablet   Oral   Take 1 tablet by mouth every 6 (six) hours as needed for pain.   11 tablet   0   . promethazine (PHENERGAN) 25 MG tablet   Oral   Take 1 tablet (25 mg total) by mouth every 6 (six) hours as needed for nausea.   12 tablet   0  BP 118/77  Pulse 78  Temp(Src) 98.1 F (36.7 C) (Oral)  Resp 16  SpO2 100%  LMP 04/01/2013 Physical Exam  Nursing note and vitals reviewed. Constitutional: She is oriented to person, place, and time. She appears well-developed and well-nourished. No distress.  HENT:  Head: Normocephalic and atraumatic.  Eyes: EOM are normal. Pupils are equal, round, and reactive to light.  Neck: Normal range of motion. Neck supple.  Cardiovascular: Normal rate and regular rhythm.  Exam reveals no friction rub.   No murmur heard. Pulmonary/Chest: Effort normal and breath sounds normal. No respiratory distress. She has no wheezes. She has no rales.  Abdominal: Soft. She exhibits no distension. There is no tenderness. There is no rebound.  Musculoskeletal: Normal range of motion. She exhibits no edema.  R wrist splint without swelling. Fingers NVI distally.  Neurological: She is  alert and oriented to person, place, and time.  Skin: She is not diaphoretic.    ED Course  Procedures (including critical care time) Labs Review Labs Reviewed - No data to display Imaging Review No results found.  MDM   1. Closed fracture of 5th metacarpal, sequela    32 year old female presents for followup her right fifth metacarpal fracture. She was unable to get in to see orthopedics, so she came here. She denies any numbness in her pinky finger. She has a little bit of motion with pain. The cast is fitting well and does not appear to be cutting off blood supply. She is neurovascular intact. I instructed her I'm not an orthopedic surgeon and cannot give her recommendations for splint removal or continuation. I gave her multiple orthopedic surgeons for followup. I explained to take the cast off or have to put back on, she agreed that we should leave him at this time. Stable for discharge.    Dagmar Hait, MD 04/19/13 1332

## 2013-08-16 ENCOUNTER — Emergency Department (HOSPITAL_COMMUNITY)
Admission: EM | Admit: 2013-08-16 | Discharge: 2013-08-16 | Disposition: A | Discharge status: Home / Self Care | Payer: Medicaid Other | Attending: Emergency Medicine | Admitting: Emergency Medicine

## 2013-08-16 ENCOUNTER — Emergency Department (HOSPITAL_COMMUNITY): Payer: Medicaid Other

## 2013-08-16 ENCOUNTER — Encounter (HOSPITAL_COMMUNITY): Payer: Self-pay | Admitting: Emergency Medicine

## 2013-08-16 DIAGNOSIS — S8990XA Unspecified injury of unspecified lower leg, initial encounter: Secondary | ICD-10-CM | POA: Insufficient documentation

## 2013-08-16 DIAGNOSIS — Z79899 Other long term (current) drug therapy: Secondary | ICD-10-CM | POA: Insufficient documentation

## 2013-08-16 DIAGNOSIS — Z96659 Presence of unspecified artificial knee joint: Secondary | ICD-10-CM | POA: Insufficient documentation

## 2013-08-16 DIAGNOSIS — Z8739 Personal history of other diseases of the musculoskeletal system and connective tissue: Secondary | ICD-10-CM | POA: Insufficient documentation

## 2013-08-16 DIAGNOSIS — F411 Generalized anxiety disorder: Secondary | ICD-10-CM | POA: Insufficient documentation

## 2013-08-16 DIAGNOSIS — S99919A Unspecified injury of unspecified ankle, initial encounter: Principal | ICD-10-CM

## 2013-08-16 DIAGNOSIS — Z880 Allergy status to penicillin: Secondary | ICD-10-CM | POA: Insufficient documentation

## 2013-08-16 DIAGNOSIS — S99929A Unspecified injury of unspecified foot, initial encounter: Principal | ICD-10-CM

## 2013-08-16 DIAGNOSIS — S8992XA Unspecified injury of left lower leg, initial encounter: Secondary | ICD-10-CM

## 2013-08-16 DIAGNOSIS — F319 Bipolar disorder, unspecified: Secondary | ICD-10-CM | POA: Insufficient documentation

## 2013-08-16 DIAGNOSIS — Y929 Unspecified place or not applicable: Secondary | ICD-10-CM | POA: Insufficient documentation

## 2013-08-16 DIAGNOSIS — Z8709 Personal history of other diseases of the respiratory system: Secondary | ICD-10-CM | POA: Insufficient documentation

## 2013-08-16 DIAGNOSIS — X500XXA Overexertion from strenuous movement or load, initial encounter: Secondary | ICD-10-CM | POA: Insufficient documentation

## 2013-08-16 DIAGNOSIS — Y939 Activity, unspecified: Secondary | ICD-10-CM | POA: Insufficient documentation

## 2013-08-16 DIAGNOSIS — F17200 Nicotine dependence, unspecified, uncomplicated: Secondary | ICD-10-CM | POA: Insufficient documentation

## 2013-08-16 MED ORDER — HYDROCODONE-ACETAMINOPHEN 5-325 MG PO TABS: 2.0000 | ORAL_TABLET | ORAL | Status: DC | PRN

## 2013-08-16 MED ORDER — ONDANSETRON 4 MG PO TBDP: 4.0000 mg | ORAL_TABLET | Freq: Three times a day (TID) | ORAL | Status: DC | PRN

## 2013-08-16 NOTE — ED Provider Notes (Signed)
CSN: [id number]     Arrival date & time 08/16/13  1559 History  This chart was scribed for non-physician practitioner Emilia BeckKaitlyn Christine Morton, PA-C working with Gwyneth SproutWhitney Plunkett, MD by Leone PayorSonum Patel, ED Scribe. This patient was seen in room WTR7/WTR7 and the patient's care was started at 1559.    Chief Complaint  Patient presents with  . Knee Pain    The history is provided by the patient. No language interpreter was used.    HPI Comments: Jamie Shields is a 33 y.o. female who presents to the Emergency Department complaining of constant, unchanged left knee pain that began last night after a twisting injury. She reports hearing a popping noise after which she began to have increasing pain. She reports applying a knee brace which she had from a prior injury. She denies numbness or weakness.   Past Medical History  Diagnosis Date  . Bursitis of hip   . Hip joint pain   . Anxiety   . Chronic bronchitis   . Bipolar 1 disorder    Past Surgical History  Procedure Laterality Date  . Knee arthroscopy    . Cesarean section      2002  . Mouth surgery  2010   Family History  Problem Relation Age of Onset  . Hypertension Mother   . Diabetes Other   . Cancer Other    History  Substance Use Topics  . Smoking status: Current Every Day Smoker -- 0.50 packs/day    Types: Cigarettes  . Smokeless tobacco: Never Used  . Alcohol Use: Yes     Comment: rarely   OB History   Grav Para Term Preterm Abortions TAB SAB Ect Mult Living   3 3             Review of Systems  Musculoskeletal: Positive for arthralgias (left knee pain). Negative for gait problem.  Neurological: Negative for weakness and numbness.    Allergies  Cucumber extract; Keflex; Ketorolac tromethamine; Macrobid; Naproxen; Penicillins; Watermelon concentrate; Darvocet; Flagyl; Ketorolac; Ultracet; Ultram; and Vicodin  Home Medications   Current Outpatient Rx  Name  Route  Sig  Dispense  Refill  . busPIRone (BUSPAR) 15 MG  tablet   Oral   Take 15 mg by mouth 3 (three) times daily.         . carbamazepine (TEGRETOL) 200 MG tablet   Oral   Take 200 mg by mouth daily.          There were no vitals taken for this visit. Physical Exam  Nursing note and vitals reviewed. Constitutional: She is oriented to person, place, and time. She appears well-developed and well-nourished.  HENT:  Head: Normocephalic and atraumatic.  Neck: Normal range of motion.  Cardiovascular: Normal rate.   Pulmonary/Chest: Effort normal.  Abdominal: She exhibits no distension.  Musculoskeletal: She exhibits tenderness. She exhibits no edema.  No obvious deformity of left knee. Limited ROM due to pain. No edema. Medial and lateral tenderness to palpation.   Neurological: She is alert and oriented to person, place, and time.  Skin: Skin is warm and dry.  Psychiatric: She has a normal mood and affect.    ED Course  Procedures (including critical care time)  DIAGNOSTIC STUDIES: Not performed    COORDINATION OF CARE: 4:19 PM Will order XRAY of the left knee. Discussed treatment plan with pt at bedside and pt agreed to plan.   Labs Review Labs Reviewed - No data to display Imaging Review Dg Knee Complete  4 Views Left  08/16/2013   CLINICAL DATA:  Knee pain after fall.  EXAM: LEFT KNEE - COMPLETE 4+ VIEW  COMPARISON:  None.  FINDINGS: There is no evidence of fracture, dislocation, or joint effusion. There is no evidence of arthropathy or other focal bone abnormality. Soft tissues are unremarkable.  IMPRESSION: Negative.   Electronically Signed   By: Elberta Fortis M.D.   On: 08/16/2013 16:47    EKG Interpretation   None       MDM   1. Left knee injury     5:09 PM Left knee xray unremarkable for acute changes. Patient will be referred to Orthopedics for further evaluation. No further evaluation needed at this time. Vitals stable and patient. No neurovascular compromise.   I personally performed the services described  in this documentation, which was scribed in my presence. The recorded information has been reviewed and is accurate.   Emilia Beck, PA-C 08/16/13 1711

## 2013-08-16 NOTE — Discharge Instructions (Signed)
Follow up with Dr. Victorino Dike for further evaluation and management of your injury. Take vicodin as needed for pain. Take zofran as needed for nausea.

## 2013-08-16 NOTE — ED Notes (Signed)
Pt reports she twisted her left knee yesterday, able to only bear slight weight on knee. Denies numbness or tingling. Pain 8/10.

## 2013-08-17 NOTE — ED Provider Notes (Signed)
Medical screening examination/treatment/procedure(s) were performed by non-physician practitioner and as supervising physician I was immediately available for consultation/collaboration.  EKG Interpretation   None        Juliet Rude. Rubin Payor, MD 08/17/13 0003

## 2014-05-28 ENCOUNTER — Encounter (HOSPITAL_COMMUNITY): Payer: Self-pay | Admitting: Emergency Medicine

## 2014-07-11 ENCOUNTER — Encounter (HOSPITAL_BASED_OUTPATIENT_CLINIC_OR_DEPARTMENT_OTHER): Payer: Self-pay

## 2014-07-11 ENCOUNTER — Emergency Department (HOSPITAL_BASED_OUTPATIENT_CLINIC_OR_DEPARTMENT_OTHER)
Admission: EM | Admit: 2014-07-11 | Discharge: 2014-07-11 | Disposition: A | Discharge status: Home / Self Care | Payer: Medicaid Other | Attending: Emergency Medicine | Admitting: Emergency Medicine

## 2014-07-11 ENCOUNTER — Emergency Department (HOSPITAL_BASED_OUTPATIENT_CLINIC_OR_DEPARTMENT_OTHER): Payer: Medicaid Other

## 2014-07-11 DIAGNOSIS — Z79899 Other long term (current) drug therapy: Secondary | ICD-10-CM | POA: Diagnosis not present

## 2014-07-11 DIAGNOSIS — Z880 Allergy status to penicillin: Secondary | ICD-10-CM | POA: Diagnosis not present

## 2014-07-11 DIAGNOSIS — S60222A Contusion of left hand, initial encounter: Secondary | ICD-10-CM | POA: Diagnosis not present

## 2014-07-11 DIAGNOSIS — Y9289 Other specified places as the place of occurrence of the external cause: Secondary | ICD-10-CM | POA: Diagnosis not present

## 2014-07-11 DIAGNOSIS — Z8739 Personal history of other diseases of the musculoskeletal system and connective tissue: Secondary | ICD-10-CM | POA: Diagnosis not present

## 2014-07-11 DIAGNOSIS — W228XXA Striking against or struck by other objects, initial encounter: Secondary | ICD-10-CM | POA: Diagnosis not present

## 2014-07-11 DIAGNOSIS — Y9389 Activity, other specified: Secondary | ICD-10-CM | POA: Diagnosis not present

## 2014-07-11 DIAGNOSIS — F319 Bipolar disorder, unspecified: Secondary | ICD-10-CM | POA: Diagnosis not present

## 2014-07-11 DIAGNOSIS — S6992XA Unspecified injury of left wrist, hand and finger(s), initial encounter: Secondary | ICD-10-CM | POA: Diagnosis present

## 2014-07-11 DIAGNOSIS — T1490XA Injury, unspecified, initial encounter: Secondary | ICD-10-CM

## 2014-07-11 DIAGNOSIS — Y998 Other external cause status: Secondary | ICD-10-CM | POA: Diagnosis not present

## 2014-07-11 DIAGNOSIS — Z720 Tobacco use: Secondary | ICD-10-CM | POA: Diagnosis not present

## 2014-07-11 DIAGNOSIS — F419 Anxiety disorder, unspecified: Secondary | ICD-10-CM | POA: Diagnosis not present

## 2014-07-11 DIAGNOSIS — Z8709 Personal history of other diseases of the respiratory system: Secondary | ICD-10-CM | POA: Diagnosis not present

## 2014-07-11 NOTE — Discharge Instructions (Signed)
Tylenol for pain. Keep hand elevated, ice, ACE wrap for support. Do not use if painful. Follow up with your doctor for recheck if not improving in the next 3-5 days.    Hand Contusion A hand contusion is a deep bruise on your hand area. Contusions are the result of an injury that caused bleeding under the skin. The contusion may turn blue, purple, or yellow. Minor injuries will give you a painless contusion, but more severe contusions may stay painful and swollen for a few weeks. CAUSES  A contusion is usually caused by a blow, trauma, or direct force to an area of the body. SYMPTOMS   Swelling and redness of the injured area.  Discoloration of the injured area.  Tenderness and soreness of the injured area.  Pain. DIAGNOSIS  The diagnosis can be made by taking a history and performing a physical exam. An X-ray, CT scan, or MRI may be needed to determine if there were any associated injuries, such as broken bones (fractures). TREATMENT  Often, the best treatment for a hand contusion is resting, elevating, icing, and applying cold compresses to the injured area. Over-the-counter medicines may also be recommended for pain control. HOME CARE INSTRUCTIONS   Put ice on the injured area.  Put ice in a plastic bag.  Place a towel between your skin and the bag.  Leave the ice on for 15-20 minutes, 03-04 times a day.  Only take over-the-counter or prescription medicines as directed by your caregiver. Your caregiver may recommend avoiding anti-inflammatory medicines (aspirin, ibuprofen, and naproxen) for 48 hours because these medicines may increase bruising.  If told, use an elastic wrap as directed. This can help reduce swelling. You may remove the wrap for sleeping, showering, and bathing. If your fingers become numb, cold, or blue, take the wrap off and reapply it more loosely.  Elevate your hand with pillows to reduce swelling.  Avoid overusing your hand if it is painful. SEEK  IMMEDIATE MEDICAL CARE IF:   You have increased redness, swelling, or pain in your hand.  Your swelling or pain is not relieved with medicines.  You have loss of feeling in your hand or are unable to move your fingers.  Your hand turns cold or blue.  You have pain when you move your fingers.  Your hand becomes warm to the touch.  Your contusion does not improve in 2 days. MAKE SURE YOU:   Understand these instructions.  Will watch your condition.  Will get help right away if you are not doing well or get worse. Document Released: 01/02/2002 Document Revised: 04/06/2012 Document Reviewed: 01/04/2012 [facility] Patient Information 2015 [place], [place]. This information is not intended to replace advice given to you by your health care provider. Make sure you discuss any questions you have with your health care provider.

## 2014-07-11 NOTE — ED Provider Notes (Signed)
CSN: [id number]     Arrival date & time 07/11/14  1132 History   First MD Initiated Contact with Patient 07/11/14 1156     Chief Complaint  Patient presents with  . Hand Injury     (Consider location/radiation/quality/duration/timing/severity/associated sxs/prior Treatment) HPI Jamie Shields is a 33 y.o. femalepresents to emergency department after injuring her left hand 4 days ago. Patient states she was angry and hit her hand on the left chair. She reports bruising and swelling to the hand. States "I thought I just sprained it." She has been applying ice and taking over-the-counter medications with no relief. States her family made her come in to get it checked out. She states pain with movement of the hand, unable to hold anything. States pain with movement of the fingers. Denies any numbness or weakness. No other injuries.    Past Medical History  Diagnosis Date  . Bursitis of hip   . Hip joint pain   . Anxiety   . Chronic bronchitis   . Bipolar 1 disorder    Past Surgical History  Procedure Laterality Date  . Knee arthroscopy    . Cesarean section      2002  . Mouth surgery  2010   Family History  Problem Relation Age of Onset  . Hypertension Mother   . Diabetes Other   . Cancer Other    History  Substance Use Topics  . Smoking status: Current Every Day Smoker -- 0.50 packs/day    Types: Cigarettes  . Smokeless tobacco: Never Used  . Alcohol Use: Yes     Comment: rarely   OB History    Gravida Para Term Preterm AB TAB SAB Ectopic Multiple Living   3 3             Review of Systems  Constitutional: Negative for fever and chills.  Musculoskeletal: Positive for joint swelling and arthralgias.  Neurological: Negative for weakness and numbness.      Allergies  Cucumber extract; Keflex; Ketorolac tromethamine; Macrobid; Naproxen; Penicillins; Watermelon concentrate; Darvocet; Flagyl; Ketorolac; Ultracet; Ultram; and Vicodin  Home Medications   Prior to  Admission medications   Medication Sig Start Date End Date Taking? Authorizing Provider  ALBUTEROL IN Inhale into the lungs.   Yes Historical Provider, MD  Beclomethasone Dipropionate (QVAR IN) Inhale into the lungs.   Yes Historical Provider, MD  LORATADINE PO Take by mouth.   Yes Historical Provider, MD  SUMAtriptan Succinate (IMITREX PO) Take by mouth.   Yes Historical Provider, MD  Topiramate (TOPAMAX PO) Take by mouth.   Yes Historical Provider, MD  busPIRone (BUSPAR) 15 MG tablet Take 15 mg by mouth 3 (three) times daily.    Historical Provider, MD  carbamazepine (TEGRETOL) 200 MG tablet Take 200 mg by mouth daily.    Historical Provider, MD   BP 144/89 mmHg  Pulse 81  Temp(Src) 98.3 F (36.8 C) (Oral)  Resp 16  Ht 5\' 6"  (1.676 m)  Wt 105 lb (47.628 kg)  BMI 16.96 kg/m2  SpO2 100%  LMP 07/01/2014 Physical Exam  Constitutional: She is oriented to person, place, and time. She appears well-developed and well-nourished. No distress.  Eyes: Conjunctivae are normal.  Neck: Neck supple.  Musculoskeletal:  No swelling noted to the left hand. There is bruising over her dorsal left hand specifically over the third, fourth, fifth metacarpals. Tender to palpation over this area. Pain with range of motion of the fourth and fifth fingers at the MCP joints.  Normal distal fingers, fingers are pink, warm, cap refill less than 2 seconds to the fingertips.Wrist normal.  Neurological: She is alert and oriented to person, place, and time.  Skin: Skin is warm and dry.  Nursing note and vitals reviewed.   ED Course  Procedures (including critical care time) Labs Review Labs Reviewed - No data to display  Imaging Review Dg Hand Complete Left  07/11/2014   CLINICAL DATA:  Hit left hand on chair lift 3 days ago. Pain along fifth metacarpal and little finger.  EXAM: LEFT HAND - COMPLETE 3+ VIEW  COMPARISON:  None.  FINDINGS: There is no evidence of fracture or dislocation. There is no evidence of  arthropathy or other focal bone abnormality. Soft tissues are unremarkable.  IMPRESSION: Negative.   Electronically Signed   By: Charlett Nose M.D.   On: 07/11/2014 12:43     EKG Interpretation None      MDM   Final diagnoses:  Contusion of left hand, initial encounter     Pt is here with continued left hand pain after hitting it on a chair 4 days ago. There is some bruising to the hand, no deformities, no swelling, neurovascularly intact. Xray obtained and is negative. Home with RICE therapy, pt has an ACE wrap she is using. Close follow up with pcp.   Filed Vitals:   07/11/14 1137  BP: 144/89  Pulse: 81  Temp: 98.3 F (36.8 C)  Resp: [address] A Ketsia Linebaugh, PA-C 07/11/14 1254  Mirian Mo, MD 07/12/14 1450

## 2014-07-11 NOTE — ED Notes (Signed)
Hit left hand on a lift chair days ago

## 2014-08-20 ENCOUNTER — Encounter (HOSPITAL_COMMUNITY): Payer: Self-pay | Admitting: Emergency Medicine

## 2014-08-20 ENCOUNTER — Emergency Department (HOSPITAL_COMMUNITY)
Admission: EM | Admit: 2014-08-20 | Discharge: 2014-08-20 | Disposition: A | Discharge status: Home / Self Care | Payer: Medicaid Other | Attending: Emergency Medicine | Admitting: Emergency Medicine

## 2014-08-20 DIAGNOSIS — Z880 Allergy status to penicillin: Secondary | ICD-10-CM | POA: Insufficient documentation

## 2014-08-20 DIAGNOSIS — M25551 Pain in right hip: Secondary | ICD-10-CM | POA: Diagnosis not present

## 2014-08-20 DIAGNOSIS — Z79899 Other long term (current) drug therapy: Secondary | ICD-10-CM | POA: Diagnosis not present

## 2014-08-20 DIAGNOSIS — Z720 Tobacco use: Secondary | ICD-10-CM | POA: Insufficient documentation

## 2014-08-20 DIAGNOSIS — F319 Bipolar disorder, unspecified: Secondary | ICD-10-CM | POA: Insufficient documentation

## 2014-08-20 DIAGNOSIS — Z9889 Other specified postprocedural states: Secondary | ICD-10-CM | POA: Insufficient documentation

## 2014-08-20 DIAGNOSIS — Z7952 Long term (current) use of systemic steroids: Secondary | ICD-10-CM | POA: Diagnosis not present

## 2014-08-20 DIAGNOSIS — F419 Anxiety disorder, unspecified: Secondary | ICD-10-CM | POA: Insufficient documentation

## 2014-08-20 DIAGNOSIS — M25552 Pain in left hip: Secondary | ICD-10-CM | POA: Insufficient documentation

## 2014-08-20 MED ORDER — OXYCODONE-ACETAMINOPHEN 5-325 MG PO TABS: 1.0000 | ORAL_TABLET | Freq: Three times a day (TID) | ORAL | Status: DC | PRN

## 2014-08-20 NOTE — Discharge Instructions (Signed)
Hip Pain Your hip is the joint between your upper legs and your lower pelvis. The bones, cartilage, tendons, and muscles of your hip joint perform a lot of work each day supporting your body weight and allowing you to move around. Hip pain can range from a minor ache to severe pain in one or both of your hips. Pain may be felt on the inside of the hip joint near the groin, or the outside near the buttocks and upper thigh. You may have swelling or stiffness as well.  HOME CARE INSTRUCTIONS   Take medicines only as directed by your health care provider.  Apply ice to the injured area:  Put ice in a plastic bag.  Place a towel between your skin and the bag.  Leave the ice on for 15-20 minutes at a time, 3-4 times a day.  Keep your leg raised (elevated) when possible to lessen swelling.  Avoid activities that cause pain.  Follow specific exercises as directed by your health care provider.  Sleep with a pillow between your legs on your most comfortable side.  Record how often you have hip pain, the location of the pain, and what it feels like. SEEK MEDICAL CARE IF:   You are unable to put weight on your leg.  Your hip is red or swollen or very tender to touch.  Your pain or swelling continues or worsens after 1 week.  You have increasing difficulty walking.  You have a fever. SEEK IMMEDIATE MEDICAL CARE IF:   You have fallen.  You have a sudden increase in pain and swelling in your hip. MAKE SURE YOU:   Understand these instructions.  Will watch your condition.  Will get help right away if you are not doing well or get worse. Document Released: 12/31/2009 Document Revised: 11/27/2013 Document Reviewed: 03/09/2013 [facility] Patient Information 2015 [place], [place]. This information is not intended to replace advice given to you by your health care provider. Make sure you discuss any questions you have with your health care provider.  Heat Therapy Heat therapy can help make  painful, stiff muscles and joints feel better. Do not use heat on new injuries. Wait at least 48 hours after an injury to use heat. Do not use heat when you have aches or pains right after an activity. If you still have pain 3 hours after stopping the activity, then you may use heat. HOME CARE Wet heat pack  Soak a clean towel in warm water. Squeeze out the extra water.  Put the warm, wet towel in a plastic bag.  Place a thin, dry towel between your skin and the bag.  Put the heat pack on the area for 5 minutes, and check your skin. Your skin may be pink, but it should not be red.  Leave the heat pack on the area for 15 to 30 minutes.  Repeat this every 2 to 4 hours while awake. Do not use heat while you are sleeping. Warm water bath  Fill a tub with warm water.  Place the affected body part in the tub.  Soak the area for 20 to 40 minutes.  Repeat as needed. Hot water bottle  Fill the water bottle half full with hot water.  Press out the extra air. Close the cap tightly.  Place a dry towel between your skin and the bottle.  Put the bottle on the area for 5 minutes, and check your skin. Your skin may be pink, but it should not be  red.  Leave the bottle on the area for 15 to 30 minutes.  Repeat this every 2 to 4 hours while awake. Electric heating pad  Place a dry towel between your skin and the heating pad.  Set the heating pad on low heat.  Put the heating pad on the area for 10 minutes, and check your skin. Your skin may be pink, but it should not be red.  Leave the heating pad on the area for 20 to 40 minutes.  Repeat this every 2 to 4 hours while awake.  Do not lie on the heating pad.  Do not fall asleep while using the heating pad.  Do not use the heating pad near water. GET HELP RIGHT AWAY IF:  You get blisters or red skin.  Your skin is puffy (swollen), or you lose feeling (numbness) in the affected area.  You have any new problems.  Your problems  are getting worse.  You have any questions or concerns. If you have any problems, stop using heat therapy until you see your doctor. MAKE SURE YOU:  Understand these instructions.  Will watch your condition.  Will get help right away if you are not doing well or get worse. Document Released: 10/05/2011 Document Reviewed: 09/05/2013 [facility] Patient Information 2015 [place], [place]. This information is not intended to replace advice given to you by your health care provider. Make sure you discuss any questions you have with your health care provider.

## 2014-08-20 NOTE — ED Notes (Signed)
Pt states she had bursitis in both hip, pt states it normally goes away. Pt states it has not and its been going on x 1 week. Pt states both hurt but left hurts more.

## 2014-08-20 NOTE — ED Provider Notes (Signed)
CSN: [id number]     Arrival date & time 08/20/14  1514 History  This chart was scribed for non-physician practitioner, Fayrene Helper, PA-C working with Ethelda Chick, MD by Greggory Stallion, ED scribe. This patient was seen in room WTR7/WTR7 and the patient's care was started at 4:00 PM.   Chief Complaint  Patient presents with  . Hip Pain   The history is provided by the patient. No language interpreter was used.    HPI Comments: Jamie Shields is a 34 y.o. female with history of bilateral hip bursitis who presents to the Emergency Department complaining of aching, burning, bilateral hip pain, left worse than right, that started one week ago. States the bursitis pain normally goes away on its own but hasn't this time. Denies recent fall or injury. Laying on her side and bearing weight worsen pain. Pt has taken warm showers, done warm compresses and taken ibuprofen with no relief. She has had steroid injections in her hips in the past and states it provided no relief. Denies fever, chills, abdominal pain, constipation, difficulty urinating, rash. Pt has an appointment with her PCP on 08/04/14. Denies history of diabetes. States she is able to toleration percocet.   Past Medical History  Diagnosis Date  . Bursitis of hip   . Hip joint pain   . Anxiety   . Chronic bronchitis   . Bipolar 1 disorder    Past Surgical History  Procedure Laterality Date  . Knee arthroscopy    . Cesarean section      2002  . Mouth surgery  2010   Family History  Problem Relation Age of Onset  . Hypertension Mother   . Diabetes Other   . Cancer Other    History  Substance Use Topics  . Smoking status: Current Every Day Smoker -- 0.50 packs/day    Types: Cigarettes  . Smokeless tobacco: Never Used  . Alcohol Use: Yes     Comment: rarely   OB History    Gravida Para Term Preterm AB TAB SAB Ectopic Multiple Living   3 3             Review of Systems  Constitutional: Negative for fever and chills.   Gastrointestinal: Negative for abdominal pain and constipation.  Genitourinary: Negative for difficulty urinating.  Musculoskeletal: Positive for arthralgias.  Skin: Negative for rash.  All other systems reviewed and are negative.  Allergies  Cucumber extract; Keflex; Ketorolac tromethamine; Macrobid; Naproxen; Penicillins; Watermelon concentrate; Darvocet; Flagyl; Ketorolac; Ultracet; Ultram; and Vicodin  Home Medications   Prior to Admission medications   Medication Sig Start Date End Date Taking? Authorizing Provider  ALBUTEROL IN Inhale into the lungs.    Historical Provider, MD  Beclomethasone Dipropionate (QVAR IN) Inhale into the lungs.    Historical Provider, MD  busPIRone (BUSPAR) 15 MG tablet Take 15 mg by mouth 3 (three) times daily.    Historical Provider, MD  carbamazepine (TEGRETOL) 200 MG tablet Take 200 mg by mouth daily.    Historical Provider, MD  LORATADINE PO Take by mouth.    Historical Provider, MD  oxyCODONE-acetaminophen (PERCOCET/ROXICET) 5-325 MG per tablet Take 1 tablet by mouth every 8 (eight) hours as needed for severe pain. 08/20/14   Fayrene Helper, PA-C  SUMAtriptan Succinate (IMITREX PO) Take by mouth.    Historical Provider, MD  Topiramate (TOPAMAX PO) Take by mouth.    Historical Provider, MD   BP 146/96 mmHg  Pulse 101  Resp 18  SpO2  100%  LMP 07/28/2014 (Exact Date)   Physical Exam  Constitutional: She is oriented to person, place, and time. She appears well-developed and well-nourished. No distress.  HENT:  Head: Normocephalic and atraumatic.  Eyes: Conjunctivae and EOM are normal.  Neck: Neck supple. No tracheal deviation present.  Cardiovascular: Normal rate.   Brisk capillary refill.   Pulmonary/Chest: Effort normal. No respiratory distress.  Musculoskeletal: Normal range of motion.  Diffuse tenderness to bilateral hips without any overlying skin changes. Full ROM noted. Pain to bilateral hips with Pearlean Brownie maneuver. No evidence of joint  infection. No midline spine tenderness.   Neurological: She is alert and oriented to person, place, and time.  Patellar DTRs intact bilaterally. Sensation intact.  Skin: Skin is warm and dry.  Psychiatric: She has a normal mood and affect. Her behavior is normal.  Nursing note and vitals reviewed.   ED Course  Procedures (including critical care time)  DIAGNOSTIC STUDIES: Oxygen Saturation is 100% on RA, normal by my interpretation.    COORDINATION OF CARE: 4:06 PM-Discussed treatment plan which includes a short course of pain medication and continuing ibuprofen with pt at bedside and pt agreed to plan.   Labs Review Labs Reviewed - No data to display  Imaging Review No results found.   EKG Interpretation None      MDM   Final diagnoses:  Bilateral hip pain    BP 146/96 mmHg  Pulse 101  Resp 18  SpO2 100%  LMP 07/28/2014 (Exact Date)   I personally performed the services described in this documentation, which was scribed in my presence. The recorded information has been reviewed and is accurate.  Fayrene Helper, PA-C 08/20/14 1618  Ethelda Chick, MD 08/20/14 [phone]

## 2014-10-16 ENCOUNTER — Emergency Department (HOSPITAL_COMMUNITY)
Admission: EM | Admit: 2014-10-16 | Discharge: 2014-10-16 | Disposition: A | Discharge status: Home / Self Care | Payer: Medicaid Other | Attending: Emergency Medicine | Admitting: Emergency Medicine

## 2014-10-16 ENCOUNTER — Encounter (HOSPITAL_COMMUNITY): Payer: Self-pay | Admitting: Emergency Medicine

## 2014-10-16 DIAGNOSIS — Z8739 Personal history of other diseases of the musculoskeletal system and connective tissue: Secondary | ICD-10-CM | POA: Diagnosis not present

## 2014-10-16 DIAGNOSIS — F319 Bipolar disorder, unspecified: Secondary | ICD-10-CM | POA: Insufficient documentation

## 2014-10-16 DIAGNOSIS — Z720 Tobacco use: Secondary | ICD-10-CM | POA: Diagnosis not present

## 2014-10-16 DIAGNOSIS — Z880 Allergy status to penicillin: Secondary | ICD-10-CM | POA: Insufficient documentation

## 2014-10-16 DIAGNOSIS — S00411A Abrasion of right ear, initial encounter: Secondary | ICD-10-CM | POA: Diagnosis not present

## 2014-10-16 DIAGNOSIS — J029 Acute pharyngitis, unspecified: Secondary | ICD-10-CM | POA: Diagnosis not present

## 2014-10-16 DIAGNOSIS — X58XXXA Exposure to other specified factors, initial encounter: Secondary | ICD-10-CM | POA: Diagnosis not present

## 2014-10-16 DIAGNOSIS — Y9389 Activity, other specified: Secondary | ICD-10-CM | POA: Diagnosis not present

## 2014-10-16 DIAGNOSIS — Y998 Other external cause status: Secondary | ICD-10-CM | POA: Diagnosis not present

## 2014-10-16 DIAGNOSIS — Y9289 Other specified places as the place of occurrence of the external cause: Secondary | ICD-10-CM | POA: Diagnosis not present

## 2014-10-16 DIAGNOSIS — Z79899 Other long term (current) drug therapy: Secondary | ICD-10-CM | POA: Diagnosis not present

## 2014-10-16 DIAGNOSIS — F419 Anxiety disorder, unspecified: Secondary | ICD-10-CM | POA: Insufficient documentation

## 2014-10-16 DIAGNOSIS — H9201 Otalgia, right ear: Secondary | ICD-10-CM | POA: Diagnosis present

## 2014-10-16 MED ORDER — OXYCODONE-ACETAMINOPHEN 5-325 MG PO TABS
1.0000 | ORAL_TABLET | Freq: Once | ORAL | Status: AC
  Administered 2014-10-16: 1 via ORAL
  Filled 2014-10-16: qty 1

## 2014-10-16 MED ORDER — OXYCODONE-ACETAMINOPHEN 5-325 MG PO TABS: 1.0000 | ORAL_TABLET | Freq: Four times a day (QID) | ORAL | Status: DC | PRN

## 2014-10-16 MED ORDER — ONDANSETRON 8 MG PO TBDP
8.0000 mg | ORAL_TABLET | Freq: Once | ORAL | Status: AC
  Administered 2014-10-16: 8 mg via ORAL
  Filled 2014-10-16: qty 1

## 2014-10-16 NOTE — Discharge Instructions (Signed)
DO NOT USE Q-TIPS! Avoid water entering the affected ear. Use tylenol or percocet as needed for pain, but don't drive while taking percocet. Follow up with your regular doctor in 3 days for recheck of the ear. Return to the ER for changes or worsening symptoms.   Otalgia The most common reason for this in children is an infection of the middle ear. Pain from the middle ear is usually caused by a build-up of fluid and pressure behind the eardrum. Pain from an earache can be sharp, dull, or burning. The pain may be temporary or constant. The middle ear is connected to the nasal passages by a short narrow tube called the Eustachian tube. The Eustachian tube allows fluid to drain out of the middle ear, and helps keep the pressure in your ear equalized. CAUSES  A cold or allergy can block the Eustachian tube with inflammation and the build-up of secretions. This is especially likely in small children, because their Eustachian tube is shorter and more horizontal. When the Eustachian tube closes, the normal flow of fluid from the middle ear is stopped. Fluid can accumulate and cause stuffiness, pain, hearing loss, and an ear infection if germs start growing in this area. SYMPTOMS  The symptoms of an ear infection may include fever, ear pain, fussiness, increased crying, and irritability. Many children will have temporary and minor hearing loss during and right after an ear infection. Permanent hearing loss is rare, but the risk increases the more infections a child has. Other causes of ear pain include retained water in the outer ear canal from swimming and bathing. Ear pain in adults is less likely to be from an ear infection. Ear pain may be referred from other locations. Referred pain may be from the joint between your jaw and the skull. It may also come from a tooth problem or problems in the neck. Other causes of ear pain include:  A foreign body in the ear.  Outer ear infection.  Sinus  infections.  Impacted ear wax.  Ear injury.  Arthritis of the jaw or TMJ problems.  Middle ear infection.  Tooth infections.  Sore throat with pain to the ears. DIAGNOSIS  Your caregiver can usually make the diagnosis by examining you. Sometimes other special studies, including x-rays and lab work may be necessary. TREATMENT   If antibiotics were prescribed, use them as directed and finish them even if you or your child's symptoms seem to be improved.  Sometimes PE tubes are needed in children. These are little plastic tubes which are put into the eardrum during a simple surgical procedure. They allow fluid to drain easier and allow the pressure in the middle ear to equalize. This helps relieve the ear pain caused by pressure changes. HOME CARE INSTRUCTIONS   Only take over-the-counter or prescription medicines for pain, discomfort, or fever as directed by your caregiver. DO NOT GIVE CHILDREN ASPIRIN because of the association of Reye's Syndrome in children taking aspirin.  Use a cold pack applied to the outer ear for 15-20 minutes, 03-04 times per day or as needed may reduce pain. Do not apply ice directly to the skin. You may cause frost bite.  Over-the-counter ear drops used as directed may be effective. Your caregiver may sometimes prescribe ear drops.  Resting in an upright position may help reduce pressure in the middle ear and relieve pain.  Ear pain caused by rapidly descending from high altitudes can be relieved by swallowing or chewing gum. Allowing infants to  suck on a bottle during airplane travel can help.  Do not smoke in the house or near children. If you are unable to quit smoking, smoke outside.  Control allergies. SEEK IMMEDIATE MEDICAL CARE IF:   You or your child are becoming sicker.  Pain or fever relief is not obtained with medicine.  You or your child's symptoms (pain, fever, or irritability) do not improve within 24 to 48 hours or as  instructed.  Severe pain suddenly stops hurting. This may indicate a ruptured eardrum.  You or your children develop new problems such as severe headaches, stiff neck, difficulty swallowing, or swelling of the face or around the ear. Document Released: 02/28/2004 Document Revised: 10/05/2011 Document Reviewed: 07/04/2008 [facility] Patient Information 2015 [place], [place]. This information is not intended to replace advice given to you by your health care provider. Make sure you discuss any questions you have with your health care provider.  Abrasion An abrasion is a cut or scrape of the skin. Abrasions do not extend through all layers of the skin and most heal within 10 days. It is important to care for your abrasion properly to prevent infection. CAUSES  Most abrasions are caused by falling on, or gliding across, the ground or other surface. When your skin rubs on something, the outer and inner layer of skin rubs off, causing an abrasion. DIAGNOSIS  Your caregiver will be able to diagnose an abrasion during a physical exam.  TREATMENT  Your treatment depends on how large and deep the abrasion is. Generally, your abrasion will be cleaned with water and a mild soap to remove any dirt or debris. An antibiotic ointment may be put over the abrasion to prevent an infection. A bandage (dressing) may be wrapped around the abrasion to keep it from getting dirty.  You may need a tetanus shot if:  You cannot remember when you had your last tetanus shot.  You have never had a tetanus shot.  The injury broke your skin. If you get a tetanus shot, your arm may swell, get red, and feel warm to the touch. This is common and not a problem. If you need a tetanus shot and you choose not to have one, there is a rare chance of getting tetanus. Sickness from tetanus can be serious.  HOME CARE INSTRUCTIONS   If a dressing was applied, change it at least once a day or as directed by your caregiver. If the bandage  sticks, soak it off with warm water.   Wash the area with water and a mild soap to remove all the ointment 2 times a day. Rinse off the soap and pat the area dry with a clean towel.   Reapply any ointment as directed by your caregiver. This will help prevent infection and keep the bandage from sticking. Use gauze over the wound and under the dressing to help keep the bandage from sticking.   Change your dressing right away if it becomes wet or dirty.   Only take over-the-counter or prescription medicines for pain, discomfort, or fever as directed by your caregiver.   Follow up with your caregiver within 24-48 hours for a wound check, or as directed. If you were not given a wound-check appointment, look closely at your abrasion for redness, swelling, or pus. These are signs of infection. SEEK IMMEDIATE MEDICAL CARE IF:   You have increasing pain in the wound.   You have redness, swelling, or tenderness around the wound.   You have pus coming  from the wound.   You have a fever or persistent symptoms for more than 2-3 days.  You have a fever and your symptoms suddenly get worse.  You have a bad smell coming from the wound or dressing.  MAKE SURE YOU:   Understand these instructions.  Will watch your condition.  Will get help right away if you are not doing well or get worse. Document Released: 04/22/2005 Document Revised: 06/29/2012 Document Reviewed: 06/16/2011 [facility] Patient Information 2015 [place], [place]. This information is not intended to replace advice given to you by your health care provider. Make sure you discuss any questions you have with your health care provider.

## 2014-10-16 NOTE — ED Provider Notes (Signed)
CSN: [id number]     Arrival date & time 10/16/14  1318 History  This chart was scribed for non-physician practitioner, Allen Derry, PA-C working with Elwin Mocha, MD by Luisa Dago, Medical Scribe. This patient was seen in room WTR5/WTR5 and the patient's care was started at 1:56 PM.    Chief Complaint  Patient presents with  . Otalgia     ear pain x 2 hours  . Sore Throat   Patient is a 34 y.o. female presenting with ear pain and pharyngitis. The history is provided by the patient and medical records. No language interpreter was used.  Otalgia Location:  Right Behind ear:  No abnormality Quality:  Aching Severity:  Moderate Onset quality:  Sudden Duration:  2 hours Timing:  Constant Progression:  Worsening Chronicity:  New Context: foreign body   Context comment:  Using Q-tip Relieved by:  Nothing Worsened by:  Nothing tried Ineffective treatments:  OTC medications Associated symptoms: no abdominal pain, no ear discharge, no fever, no hearing loss, no rhinorrhea, no sore throat, no tinnitus and no vomiting   Sore Throat Pertinent negatives include no abdominal pain.   HPI Comments: Jamie Shields is a 34 y.o. female who presents to the Emergency Department complaining of sudden onset right sided ear pain that started 2 hours ago. She states that she cleaned out the affected ear with a Q-tip which caused her to experience some mild discomfort so she decided to apply antipyrine and benzocaine otic ear drops to the right ear, but the pain has been progressively worsening after the application of the drops and she states that the pain is now radiating down the side of her neck on the right.  Pain is also worsened by exposure to cold air. Pt endorses a h/o multiple internal and external ear infection when she was a child. She denies doing any swimming recently. Pt denies fever, neck pain, sore throat, visual disturbance, cough, SOB, nausea, emesis, or rash as associated  symptoms.  Denies ear drainage or mastoid erythema. Denies drooling or trismus.   Past Medical History  Diagnosis Date  . Bursitis of hip   . Hip joint pain   . Anxiety   . Chronic bronchitis   . Bipolar 1 disorder    Past Surgical History  Procedure Laterality Date  . Knee arthroscopy    . Cesarean section      2002  . Mouth surgery  2010   Family History  Problem Relation Age of Onset  . Hypertension Mother   . Diabetes Other   . Cancer Other    History  Substance Use Topics  . Smoking status: Current Every Day Smoker -- 0.50 packs/day    Types: Cigarettes  . Smokeless tobacco: Never Used  . Alcohol Use: Yes     Comment: rarely   OB History    Gravida Para Term Preterm AB TAB SAB Ectopic Multiple Living   3 3             Review of Systems  Constitutional: Negative for fever.  HENT: Positive for ear pain. Negative for drooling, ear discharge, hearing loss, rhinorrhea, sore throat, tinnitus and trouble swallowing.   Gastrointestinal: Negative for nausea, vomiting and abdominal pain.  Skin: Negative for color change.  10 Systems reviewed and all are negative for acute change except as noted in the HPI. Allergies  Cucumber extract; Keflex; Ketorolac tromethamine; Macrobid; Naproxen; Penicillins; Watermelon concentrate; Darvocet; Flagyl; Ketorolac; Ultracet; Ultram; and Vicodin  Home Medications  Prior to Admission medications   Medication Sig Start Date End Date Taking? Authorizing Provider  ALBUTEROL IN Inhale into the lungs.    Historical Provider, MD  Beclomethasone Dipropionate (QVAR IN) Inhale into the lungs.    Historical Provider, MD  busPIRone (BUSPAR) 15 MG tablet Take 15 mg by mouth 3 (three) times daily.    Historical Provider, MD  carbamazepine (TEGRETOL) 200 MG tablet Take 200 mg by mouth daily.    Historical Provider, MD  LORATADINE PO Take by mouth.    Historical Provider, MD  oxyCODONE-acetaminophen (PERCOCET/ROXICET) 5-325 MG per tablet Take 1  tablet by mouth every 8 (eight) hours as needed for severe pain. 08/20/14   Fayrene Helper, PA-C  SUMAtriptan Succinate (IMITREX PO) Take by mouth.    Historical Provider, MD  Topiramate (TOPAMAX PO) Take by mouth.    Historical Provider, MD   BP 127/92 mmHg  Pulse 84  Temp(Src) 98 F (36.7 C) (Oral)  Resp 18  SpO2 100%  LMP 10/16/2014 (Exact Date)  Physical Exam  Constitutional: She is oriented to person, place, and time. Vital signs are normal. She appears well-developed and well-nourished.  Non-toxic appearance. No distress.  Afebrile, nontoxic, NAD  HENT:  Head: Normocephalic and atraumatic.  Right Ear: Hearing and tympanic membrane normal. There is tenderness (abraided skin of external ear canal). No drainage or swelling.  Left Ear: Hearing, tympanic membrane, external ear and ear canal normal.  Nose: Nose normal.  Mouth/Throat: Uvula is midline, oropharynx is clear and moist and mucous membranes are normal.  Right ear with no TM erythema or bulging, canal with no swelling or drainage, with abraided skin of external ear canal. No mastoid tenderness or erythema. No pain with pinna retraction. Nose clear. Oropharynx clear and moist with no exudates.   Eyes: Conjunctivae and EOM are normal. Right eye exhibits no discharge. Left eye exhibits no discharge.  Neck: Normal range of motion. Neck supple.  Cardiovascular: Normal rate.   Pulmonary/Chest: Effort normal. No respiratory distress.  Abdominal: Normal appearance. She exhibits no distension.  Musculoskeletal: Normal range of motion.  Neurological: She is alert and oriented to person, place, and time. She has normal strength. No sensory deficit.  Skin: Skin is warm, dry and intact. No rash noted.  Psychiatric: She has a normal mood and affect. Her behavior is normal.  Nursing note and vitals reviewed.   ED Course  Procedures (including critical care time)  DIAGNOSTIC STUDIES: Oxygen Saturation is 100% on RA, normal by my  interpretation.    COORDINATION OF CARE: 2:07 PM- Will give pain medication here in the ED. Pt advised of plan for treatment and pt agrees.  Medications  oxyCODONE-acetaminophen (PERCOCET/ROXICET) 5-325 MG per tablet 1 tablet (not administered)  ondansetron (ZOFRAN-ODT) disintegrating tablet 8 mg (not administered)    MDM   Final diagnoses:  Acute otalgia, right  Ear canal abrasion, right, initial encounter    34 y.o. female here with ear canal abrasion after using q-tip. No drainage or swelling, does not appear to be infected and not a very severe abrasion. Some ear wax remained, which was removed with a curette. Don't feel pt needs abx today, will have her f/up with PCP in 3 days for recheck. Discussed avoidance of water in ear canal. No throat abnormalities on exam, doubt strep, did not strep test. Pain is being referred from ear. Will give percocet here with zofran pre-medicating since pt has nausea with multiple other pain meds. Will send home with oxycodone. I  explained the diagnosis and have given explicit precautions to return to the ER including for any other new or worsening symptoms. The patient understands and accepts the medical plan as it's been dictated and I have answered their questions. Discharge instructions concerning home care and prescriptions have been given. The patient is STABLE and is discharged to home in good condition.   I personally performed the services described in this documentation, which was scribed in my presence. The recorded information has been reviewed and is accurate.  BP 127/92 mmHg  Pulse 80  Temp(Src) 98 F (36.7 C) (Oral)  Resp 18  SpO2 100%  LMP 10/16/2014 (Exact Date)  Meds ordered this encounter  Medications  . oxyCODONE-acetaminophen (PERCOCET/ROXICET) 5-325 MG per tablet 1 tablet    Sig:   . ondansetron (ZOFRAN-ODT) disintegrating tablet 8 mg    Sig:   . oxyCODONE-acetaminophen (PERCOCET/ROXICET) 5-325 MG per tablet    Sig: Take  1 tablet by mouth every 6 (six) hours as needed for severe pain.    Dispense:  10 tablet    Refill:  0    Order Specific Question:  Supervising Provider    Answer:  Eber Hong [[id number]]      Michelle Vanhise Camprubi-Soms, PA-C 10/16/14 1448  Elwin Mocha, MD 10/16/14 [phone]

## 2014-10-16 NOTE — ED Notes (Signed)
Pt reports severe r/ear and throat pain x 2 hours. Pt noted after shower at 1100.

## 2014-12-20 ENCOUNTER — Ambulatory Visit: Payer: Medicaid Other | Attending: Orthopedic Surgery

## 2014-12-20 DIAGNOSIS — R29898 Other symptoms and signs involving the musculoskeletal system: Secondary | ICD-10-CM | POA: Diagnosis not present

## 2014-12-20 DIAGNOSIS — R609 Edema, unspecified: Secondary | ICD-10-CM | POA: Insufficient documentation

## 2014-12-20 DIAGNOSIS — R262 Difficulty in walking, not elsewhere classified: Secondary | ICD-10-CM | POA: Diagnosis not present

## 2014-12-20 DIAGNOSIS — M25552 Pain in left hip: Secondary | ICD-10-CM | POA: Diagnosis present

## 2014-12-20 NOTE — Patient Instructions (Signed)
From cabinet she was issued SLR on bed and ITB stretch for home 2-3x/day, 30-90 sec hold for stretch and 10-15 reps with leg lifts.

## 2014-12-20 NOTE — Therapy (Addendum)
[place], [place], [address] Phone: [phone]   Fax:  [phone]  Physical Therapy Evaluation  Patient Details  Name: Jamie Shields MRN: [id number] Date of Birth: 1981/01/18 Referring Provider:  Renette Butters, MD  Encounter Date: 12/20/2014      PT End of Session - 12/20/14 1059    Visit Number 1   Number of Visits 4   Date for PT Re-Evaluation 01/24/15   Authorization Type Medicaid pending    Authorization Time Period 5/26 t0 01/24/15   Authorization - Visit Number 1   Authorization - Number of Visits 4   PT Start Time 0932   PT Stop Time 1052   PT Time Calculation (min) 37 min   Activity Tolerance Patient limited by pain   Behavior During Therapy [facility] for tasks assessed/performed      Past Medical History  Diagnosis Date  . Bursitis of hip   . Hip joint pain   . Anxiety   . Chronic bronchitis   . Bipolar 1 disorder     Past Surgical History  Procedure Laterality Date  . Knee arthroscopy    . Cesarean section      2002  . Mouth surgery  2010    There were no vitals filed for this visit.  Visit Diagnosis:  Pain in left hip - Plan: PT plan of care cert/re-cert  Difficulty walking - Plan: PT plan of care cert/re-cert  Weakness of left hip - Plan: PT plan of care cert/re-cert  Edema - Plan: PT plan of care cert/re-cert      Subjective Assessment - 12/20/14 1022    Subjective She reports pain twitching stiffness in LT hip. She is not able to walk without 2 crutches. she did not use crutches prior to surgery.    Pertinent History Post surgery . 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    Limitations Walking  can;t play with kids, sleep 2-3 hours per night.    How long can you sit comfortably? 15 min   How long can you stand comfortably? 15 min   How long can you walk comfortably? with crutches 30 min   Patient Stated Goals She wants to wlak without crutches   Currently in  Pain? Yes   Pain Score 7    Pain Location Hip   Pain Orientation Left   Pain Descriptors / Indicators Aching;Burning   Pain Type Surgical pain   Pain Onset 1 to 4 weeks ago   Pain Frequency Constant   Aggravating Factors  prolonged sitting or standing   Pain Relieving Factors cold, medication   Multiple Pain Sites No            [facility] PT Assessment - 12/20/14 1026    Assessment   Medical Diagnosis trocanteric bursectomy and ITB resection LT   Onset Date/Surgical Date 11/26/14   Next MD Visit not sure   Prior Therapy no   Precautions   Precautions None   Restrictions   Weight Bearing Restrictions No   Balance Screen   Has the patient fallen in the past 6 months Yes   How many times? 1  slipped in tub prior to surgery   Prior Function   Level of Independence Independent with household mobility with device   Cognition   Overall Cognitive Status Within Functional Limits for tasks assessed   Observation/Other Assessments   Observations Bruishing around incision about 2-3 inches wide and some puffiness along  incision but well healed.    Posture/Postural Control   Posture Comments walks with LT leg slightly flexed   ROM / Strength   AROM / PROM / Strength AROM;Strength;PROM   AROM   AROM Assessment Site Hip   Right/Left Hip Right;Left   Right Hip Extension 20   Right Hip Flexion 140   Right Hip External Rotation  70   Right Hip Internal Rotation  45   Right Hip ABduction 45   Left Hip Extension -10   Left Hip Flexion 130   Left Hip External Rotation  45   Left Hip Internal Rotation  45   Left Hip ABduction 35   PROM   Overall PROM Comments SLR RT and LT 80 degrees   PROM Assessment Site Hip   Right/Left Hip Left   Strength   Overall Strength Comments Quads /hamstrings and ankles WNL. except LT hamstrings 4+/5.   RT hip strength normal   LT 4/5 flexion. aduction 4+/5  abduciton 2/5, extension 2/5 all with pain   Palpation   Palpation comment Tender around LT hip  and laterla thigh   Ambulation/Gait   Gait Comments She walks PWB on LT with 2 crutches   Functional Gait  Assessment   Gait assessed  Yes                           PT Education - 12/20/14 1058    Education provided Yes   Education Details POC, Medicaid limits, HEP   Person(s) Educated Patient   Methods Explanation;Tactile cues;Verbal cues;Handout   Comprehension Returned demonstration;Verbalized understanding             PT Long Term Goals - 12/20/14 1055    PT LONG TERM GOAL #1   Title she will be independent with all HEP issue das of last visit   Baseline no program   Time 5   Period Weeks   Status New   PT LONG TERM GOAL #2   Title she will report pain improved so she is able to walk with no device   Time 5   Period Weeks   Status New   PT LONG TERM GOAL #3   Title She will demo Lt hip strenght 4+/5 to be able to walk without device.    Baseline Hip stength 2/5 with extension and abduciton   Time 5   Period Weeks   Status New   PT LONG TERM GOAL #4   Title She will report able to begin to play with children due to decreasd pain and improved independnece with walking   Baseline Not able to play with kids   Time 5   Period Weeks   Status New               Plan - 12/20/14 1100    Clinical Impression Statement She is limited with mobility due to pain and weakness, swelling of left hip She should benefit from PT. She is post surgery for bursectomy (27062) and ITB resection (73710)   Pt will benefit from skilled therapeutic intervention in order to improve on the following deficits Difficulty walking;Pain;Decreased strength;Increased edema;Decreased activity tolerance   Rehab Potential Good   PT Frequency 3x / week   PT Duration --  over 5 weeks   PT Treatment/Interventions Passive range of motion;Patient/family education;Manual techniques;Taping;Therapeutic exercise;Gait training;Cryotherapy;Electrical Stimulation   PT Next Visit Plan  Stretching and strength and weight bearing/gait , tape  PT Home Exercise Plan stretching and mat SLR   Consulted and Agree with Plan of Care Patient         Problem List Patient Active Problem List   Diagnosis Date Noted  . Bursitis of hip     Darrel Hoover PT 12/20/2014, 11:11 AM  [facility] [address] [place], [place], [address] Phone: [phone]   Fax:  [phone]    PHYSICAL THERAPY DISCHARGE SUMMARY  Visits from Start of Care: EVAL only  Current functional level related to goals / functional outcomes: Same as at eval  Remaining deficits: Cont with pain    Education / Equipment: HEP Plan: Patient agrees to discharge.  Patient goals were not met. Patient is being discharged due to financial reasons.  ?????   Darrel Hoover, PT  01/07/15  2:10 PM

## 2015-01-07 ENCOUNTER — Ambulatory Visit: Payer: Medicaid Other | Attending: Orthopedic Surgery

## 2015-01-07 ENCOUNTER — Telehealth: Payer: Self-pay | Admitting: Orthopedic Surgery

## 2015-01-09 NOTE — Telephone Encounter (Signed)
Spoke to Ms Escandon and she opted for discharge

## 2015-01-14 ENCOUNTER — Ambulatory Visit: Payer: Medicaid Other

## 2015-01-21 ENCOUNTER — Ambulatory Visit: Payer: Medicaid Other

## 2015-03-22 ENCOUNTER — Emergency Department (HOSPITAL_COMMUNITY): Payer: Medicaid Other

## 2015-03-22 ENCOUNTER — Emergency Department (HOSPITAL_COMMUNITY)
Admission: EM | Admit: 2015-03-22 | Discharge: 2015-03-22 | Disposition: A | Discharge status: Home / Self Care | Payer: Medicaid Other | Attending: Emergency Medicine | Admitting: Emergency Medicine

## 2015-03-22 ENCOUNTER — Encounter (HOSPITAL_COMMUNITY): Payer: Self-pay | Admitting: Emergency Medicine

## 2015-03-22 DIAGNOSIS — R197 Diarrhea, unspecified: Secondary | ICD-10-CM | POA: Insufficient documentation

## 2015-03-22 DIAGNOSIS — Z8709 Personal history of other diseases of the respiratory system: Secondary | ICD-10-CM | POA: Insufficient documentation

## 2015-03-22 DIAGNOSIS — Z8739 Personal history of other diseases of the musculoskeletal system and connective tissue: Secondary | ICD-10-CM | POA: Insufficient documentation

## 2015-03-22 DIAGNOSIS — Z3202 Encounter for pregnancy test, result negative: Secondary | ICD-10-CM | POA: Diagnosis not present

## 2015-03-22 DIAGNOSIS — R112 Nausea with vomiting, unspecified: Secondary | ICD-10-CM | POA: Insufficient documentation

## 2015-03-22 DIAGNOSIS — Z8659 Personal history of other mental and behavioral disorders: Secondary | ICD-10-CM | POA: Insufficient documentation

## 2015-03-22 DIAGNOSIS — R109 Unspecified abdominal pain: Secondary | ICD-10-CM

## 2015-03-22 DIAGNOSIS — Z880 Allergy status to penicillin: Secondary | ICD-10-CM | POA: Diagnosis not present

## 2015-03-22 DIAGNOSIS — Z720 Tobacco use: Secondary | ICD-10-CM | POA: Diagnosis not present

## 2015-03-22 DIAGNOSIS — R05 Cough: Secondary | ICD-10-CM | POA: Insufficient documentation

## 2015-03-22 DIAGNOSIS — R1011 Right upper quadrant pain: Secondary | ICD-10-CM | POA: Diagnosis not present

## 2015-03-22 LAB — COMPREHENSIVE METABOLIC PANEL
ALT: 24 U/L (ref 14–54)
ANION GAP: 8 (ref 5–15)
AST: 34 U/L (ref 15–41)
Albumin: 5 g/dL (ref 3.5–5.0)
Alkaline Phosphatase: 96 U/L (ref 38–126)
BILIRUBIN TOTAL: 0.9 mg/dL (ref 0.3–1.2)
BUN: 7 mg/dL (ref 6–20)
CHLORIDE: 107 mmol/L (ref 101–111)
CO2: 20 mmol/L — ABNORMAL LOW (ref 22–32)
Calcium: 9.8 mg/dL (ref 8.9–10.3)
Creatinine, Ser: 0.58 mg/dL (ref 0.44–1.00)
GFR calc Af Amer: >60 mL/min (ref >60)
Glucose, Bld: 104 mg/dL — ABNORMAL HIGH (ref 65–99)
POTASSIUM: 3.6 mmol/L (ref 3.5–5.1)
Sodium: 135 mmol/L (ref 135–145)
TOTAL PROTEIN: 8.4 g/dL — AB (ref 6.5–8.1)

## 2015-03-22 LAB — URINALYSIS, ROUTINE W REFLEX MICROSCOPIC
BILIRUBIN URINE: NEGATIVE
Glucose, UA: NEGATIVE mg/dL
Hgb urine dipstick: NEGATIVE
Ketones, ur: NEGATIVE mg/dL
LEUKOCYTES UA: NEGATIVE
NITRITE: NEGATIVE
PH: 5.5 (ref 5.0–8.0)
Protein, ur: NEGATIVE mg/dL
SPECIFIC GRAVITY, URINE: 1.028 (ref 1.005–1.030)
UROBILINOGEN UA: 0.2 mg/dL (ref 0.0–1.0)

## 2015-03-22 LAB — CBC
HEMATOCRIT: 39.2 % (ref 36.0–46.0)
HEMOGLOBIN: 13.2 g/dL (ref 12.0–15.0)
MCH: 33.9 pg (ref 26.0–34.0)
MCHC: 33.7 g/dL (ref 30.0–36.0)
MCV: 100.8 fL — AB (ref 78.0–100.0)
Platelets: 254 10*3/uL (ref 150–400)
RBC: 3.89 MIL/uL (ref 3.87–5.11)
RDW: 12.8 % (ref 11.5–15.5)
WBC: 5.7 10*3/uL (ref 4.0–10.5)

## 2015-03-22 LAB — I-STAT BETA HCG BLOOD, ED (MC, WL, AP ONLY)

## 2015-03-22 LAB — LIPASE, BLOOD: LIPASE: 27 U/L (ref 22–51)

## 2015-03-22 MED ORDER — ONDANSETRON HCL 4 MG/2ML IJ SOLN
4.0000 mg | Freq: Once | INTRAMUSCULAR | Status: AC
  Administered 2015-03-22: 4 mg via INTRAVENOUS
  Filled 2015-03-22: qty 2

## 2015-03-22 MED ORDER — ONDANSETRON 4 MG PO TBDP: 4.0000 mg | ORAL_TABLET | Freq: Three times a day (TID) | ORAL | Status: DC | PRN

## 2015-03-22 MED ORDER — MORPHINE SULFATE (PF) 4 MG/ML IV SOLN
4.0000 mg | Freq: Once | INTRAVENOUS | Status: AC
  Administered 2015-03-22: 4 mg via INTRAVENOUS
  Filled 2015-03-22: qty 1

## 2015-03-22 MED ORDER — SODIUM CHLORIDE 0.9 % IV BOLUS (SEPSIS)
1000.0000 mL | Freq: Once | INTRAVENOUS | Status: AC
  Administered 2015-03-22: 1000 mL via INTRAVENOUS

## 2015-03-22 NOTE — Discharge Instructions (Signed)
Please follow up with your primary care physician in 1-2 days. If you do not have one please call the [facility] number listed above. Please read all discharge instructions and return precautions.  ° ° °Abdominal Pain °Many things can cause abdominal pain. Usually, abdominal pain is not caused by a disease and will improve without treatment. It can often be observed and treated at home. Your health care provider will do a physical exam and possibly order blood tests and X-rays to help determine the seriousness of your pain. However, in many cases, more time must pass before a clear cause of the pain can be found. Before that point, your health care provider may not know if you need more testing or further treatment. °HOME CARE INSTRUCTIONS  °Monitor your abdominal pain for any changes. The following actions may help to alleviate any discomfort you are experiencing: °· Only take over-the-counter or prescription medicines as directed by your health care provider. °· Do not take laxatives unless directed to do so by your health care provider. °· Try a clear liquid diet (broth, tea, or water) as directed by your health care provider. Slowly move to a bland diet as tolerated. °SEEK MEDICAL CARE IF: °· You have unexplained abdominal pain. °· You have abdominal pain associated with nausea or diarrhea. °· You have pain when you urinate or have a bowel movement. °· You experience abdominal pain that wakes you in the night. °· You have abdominal pain that is worsened or improved by eating food. °· You have abdominal pain that is worsened with eating fatty foods. °· You have a fever. °SEEK IMMEDIATE MEDICAL CARE IF:  °· Your pain does not go away within 2 hours. °· You keep throwing up (vomiting). °· Your pain is felt only in portions of the abdomen, such as the right side or the left lower portion of the abdomen. °· You pass bloody or black tarry stools. °MAKE SURE YOU: °· Understand these instructions.    °· Will watch your condition.   °· Will get help right away if you are not doing well or get worse.   °Document Released: 04/22/2005 Document Revised: 07/18/2013 Document Reviewed: 03/22/2013 °ExitCare® Patient Information ©2015 [employer]. This information is not intended to replace advice given to you by your health care provider. Make sure you discuss any questions you have with your health care provider. ° ° °

## 2015-03-22 NOTE — ED Provider Notes (Signed)
CSN: [id number]     Arrival date & time 03/22/15  1747 History   First MD Initiated Contact with Patient 03/22/15 1809     Chief Complaint  Patient presents with  . Emesis  . RUQ pain      (Consider location/radiation/quality/duration/timing/severity/associated sxs/prior Treatment) HPI Comments: Patient is a 34 year old female presented to the emergency department for evaluation of acute onset nausea, nonbloody nonbilious vomiting, right upper quadrant pain. She states her pain began around 11:15 this morning. She states she ate chicken nuggets this morning for breakfast. No modifying factors identified for her pain, nausea or vomiting. Patient endorses one episode of nonbloody diarrhea this morning. Abdominal surgical history includes a cesarean section.  Patient is a 34 y.o. female presenting with vomiting and abdominal pain. The history is provided by the patient.  Emesis Associated symptoms: abdominal pain and diarrhea   Abdominal Pain Pain location:  RUQ Pain radiates to:  Does not radiate Pain severity:  Severe Onset quality:  Sudden Duration:  7 hours Timing:  Constant Progression:  Worsening Relieved by:  None tried Worsened by:  Nothing tried Ineffective treatments:  None tried Associated symptoms: cough, diarrhea, nausea and vomiting     Past Medical History  Diagnosis Date  . Bursitis of hip   . Hip joint pain   . Anxiety   . Chronic bronchitis   . Bipolar 1 disorder   . Seizures    Past Surgical History  Procedure Laterality Date  . Knee arthroscopy    . Cesarean section      2002  . Mouth surgery  2010   Family History  Problem Relation Age of Onset  . Hypertension Mother   . Diabetes Other   . Cancer Other    Social History  Substance Use Topics  . Smoking status: Current Every Day Smoker -- 0.50 packs/day    Types: Cigarettes  . Smokeless tobacco: Never Used  . Alcohol Use: Yes     Comment: rarely   OB History    Gravida Para Term Preterm AB  TAB SAB Ectopic Multiple Living   3 3             Review of Systems  Respiratory: Positive for cough.   Gastrointestinal: Positive for nausea, vomiting, abdominal pain and diarrhea.  All other systems reviewed and are negative.     Allergies  Cucumber extract; Keflex; Ketorolac tromethamine; Macrobid; Naproxen; Penicillins; Watermelon concentrate; Darvocet; Flagyl; Ketorolac; Ultracet; Ultram; and Vicodin  Home Medications   Prior to Admission medications   Medication Sig Start Date End Date Taking? Authorizing Provider  Phenylephrine-Acetaminophen (SINUS CONGESTION/PAIN DAYTIME PO) Take 2 tablets by mouth 3 (three) times daily as needed (sinus congestion).   Yes Historical Provider, MD  ondansetron (ZOFRAN ODT) 4 MG disintegrating tablet Take 1 tablet (4 mg total) by mouth every 8 (eight) hours as needed for nausea. 03/22/15   Francee Piccolo, PA-C  oxyCODONE-acetaminophen (PERCOCET/ROXICET) 5-325 MG per tablet Take 1 tablet by mouth every 8 (eight) hours as needed for severe pain. Patient not taking: Reported on 12/20/2014 08/20/14   Fayrene Helper, PA-C  oxyCODONE-acetaminophen (PERCOCET/ROXICET) 5-325 MG per tablet Take 1 tablet by mouth every 6 (six) hours as needed for severe pain. Patient not taking: Reported on 12/20/2014 10/16/14   Mercedes Camprubi-Soms, PA-C   BP 129/94 mmHg  Pulse 87  Temp(Src) 98.2 F (36.8 C) (Oral)  Resp 17  SpO2 100%  LMP 03/01/2015 Physical Exam  Constitutional: She is oriented to person, place,  and time. She appears well-developed and well-nourished. No distress.  HENT:  Head: Normocephalic and atraumatic.  Right Ear: External ear normal.  Left Ear: External ear normal.  Nose: Nose normal.  Eyes: Conjunctivae are normal.  Neck: Neck supple.  Cardiovascular: Normal rate, regular rhythm and normal heart sounds.   Pulmonary/Chest: Effort normal and breath sounds normal.  Abdominal: Soft. Bowel sounds are normal. There is tenderness in the right  upper quadrant. There is no rigidity.  Neurological: She is alert and oriented to person, place, and time.  Skin: Skin is warm and dry. She is not diaphoretic.  Nursing note and vitals reviewed.   ED Course  Procedures (including critical care time) Medications  morphine 4 MG/ML injection 4 mg (4 mg Intravenous Given 03/22/15 1845)  sodium chloride 0.9 % bolus 1,000 mL (1,000 mLs Intravenous New Bag/Given 03/22/15 1845)  ondansetron (ZOFRAN) injection 4 mg (4 mg Intravenous Given 03/22/15 1845)    Labs Review Labs Reviewed  COMPREHENSIVE METABOLIC PANEL - Abnormal; Notable for the following:    CO2 20 (*)    Glucose, Bld 104 (*)    Total Protein 8.4 (*)    All other components within normal limits  CBC - Abnormal; Notable for the following:    MCV 100.8 (*)    All other components within normal limits  URINALYSIS, ROUTINE W REFLEX MICROSCOPIC (NOT AT [facility]) - Abnormal; Notable for the following:    Color, Urine AMBER (*)    APPearance CLOUDY (*)    All other components within normal limits  LIPASE, BLOOD  I-STAT BETA HCG BLOOD, ED ([facility], [facility], [facility] ONLY)    Imaging Review Dg Chest 2 View  03/22/2015   CLINICAL DATA:  Pt c/o vomiting, RUQ/ R lower chest pain onset this morning. Pt denies any sob. No cardiopulmonary history. Current everyday smoker.  EXAM: CHEST  2 VIEW  COMPARISON:  01/16/2013  FINDINGS: Lungs are hyperexpanded but clear. No pleural effusion or pneumothorax.  Cardiac silhouette is normal in size and configuration. Normal mediastinal and hilar contours.  Skeletal structures are unremarkable.  IMPRESSION: No active cardiopulmonary disease.   Electronically Signed   By: Amie Portland M.D.   On: 03/22/2015 20:47   US Abdomen Limited Ruq  03/22/2015   CLINICAL DATA:  Right upper quadrant pain and emesis.  EXAM: US ABDOMEN LIMITED - RIGHT UPPER QUADRANT  COMPARISON:  None.  FINDINGS: Gallbladder:  No gallstones or wall thickening visualized. No sonographic Murphy sign noted.   Common bile duct:  Diameter: 4.3- 6.3 mm, normal  Liver:  No focal lesion identified. Within normal limits in parenchymal echogenicity.  IMPRESSION: Normal examination.  No evidence of cholelithiasis or cholecystitis.   Electronically Signed   By: Burman Nieves M.D.   On: 03/22/2015 19:45   I have personally reviewed and evaluated these images and lab results as part of my medical decision-making.   EKG Interpretation None      MDM   Final diagnoses:  Nausea and vomiting in adult patient  Abdominal pain in female patient    Filed Vitals:   03/22/15 1807  BP: 129/94  Pulse: 87  Temp: 98.2 F (36.8 C)  Resp: 17   Afebrile, NAD, non-toxic appearing, AAOx4.   Patient is nontoxic, nonseptic appearing, in no apparent distress.  Patient's pain and other symptoms adequately managed in emergency department.  Fluid bolus given.  Labs, imaging and vitals reviewed.  Patient does not meet the SIRS or Sepsis criteria.  On repeat  exam patient does not have a surgical abdomin and there are no peritoneal signs.  No indication of appendicitis, bowel obstruction, bowel perforation, cholecystitis, diverticulitis, PID or ectopic pregnancy.  No evidence of PNA on CXR w/ cough causing referred pain. Patient discharged home with symptomatic treatment and given strict instructions for follow-up with their primary care physician.  I have also discussed reasons to return immediately to the ER.  Patient expresses understanding and agrees with plan. Patient is stable at time of discharge       Francee Piccolo, PA-C 03/22/15 2122  Gilda Crease, MD 03/22/15 [phone]

## 2015-03-22 NOTE — ED Notes (Signed)
Pt states that over the past few weeks she has been taking OTC meds for sinus problems.  Today around 1130am she started vomiting at work, having RUQ pain and shaking while at work.  Pt states that her employer wouldn't let her leave.

## 2015-04-28 ENCOUNTER — Encounter (HOSPITAL_COMMUNITY): Payer: Self-pay | Admitting: *Deleted

## 2015-04-28 ENCOUNTER — Emergency Department (HOSPITAL_COMMUNITY)
Admission: EM | Admit: 2015-04-28 | Discharge: 2015-04-28 | Disposition: A | Discharge status: Home / Self Care | Payer: Medicaid Other | Attending: Emergency Medicine | Admitting: Emergency Medicine

## 2015-04-28 ENCOUNTER — Emergency Department (HOSPITAL_COMMUNITY): Payer: Medicaid Other

## 2015-04-28 DIAGNOSIS — Z8709 Personal history of other diseases of the respiratory system: Secondary | ICD-10-CM | POA: Insufficient documentation

## 2015-04-28 DIAGNOSIS — Y998 Other external cause status: Secondary | ICD-10-CM | POA: Insufficient documentation

## 2015-04-28 DIAGNOSIS — Y9389 Activity, other specified: Secondary | ICD-10-CM | POA: Insufficient documentation

## 2015-04-28 DIAGNOSIS — Z3202 Encounter for pregnancy test, result negative: Secondary | ICD-10-CM | POA: Insufficient documentation

## 2015-04-28 DIAGNOSIS — X58XXXA Exposure to other specified factors, initial encounter: Secondary | ICD-10-CM | POA: Insufficient documentation

## 2015-04-28 DIAGNOSIS — Z8659 Personal history of other mental and behavioral disorders: Secondary | ICD-10-CM | POA: Diagnosis not present

## 2015-04-28 DIAGNOSIS — Z880 Allergy status to penicillin: Secondary | ICD-10-CM | POA: Insufficient documentation

## 2015-04-28 DIAGNOSIS — N390 Urinary tract infection, site not specified: Secondary | ICD-10-CM

## 2015-04-28 DIAGNOSIS — T7840XA Allergy, unspecified, initial encounter: Secondary | ICD-10-CM | POA: Diagnosis not present

## 2015-04-28 DIAGNOSIS — Z79899 Other long term (current) drug therapy: Secondary | ICD-10-CM | POA: Insufficient documentation

## 2015-04-28 DIAGNOSIS — Z8739 Personal history of other diseases of the musculoskeletal system and connective tissue: Secondary | ICD-10-CM | POA: Insufficient documentation

## 2015-04-28 DIAGNOSIS — Z720 Tobacco use: Secondary | ICD-10-CM | POA: Diagnosis not present

## 2015-04-28 DIAGNOSIS — Z882 Allergy status to sulfonamides status: Secondary | ICD-10-CM | POA: Insufficient documentation

## 2015-04-28 DIAGNOSIS — Y9289 Other specified places as the place of occurrence of the external cause: Secondary | ICD-10-CM | POA: Diagnosis not present

## 2015-04-28 DIAGNOSIS — R21 Rash and other nonspecific skin eruption: Secondary | ICD-10-CM | POA: Diagnosis present

## 2015-04-28 LAB — BASIC METABOLIC PANEL
Anion gap: 10 (ref 5–15)
BUN: 11 mg/dL (ref 6–20)
CHLORIDE: 102 mmol/L (ref 101–111)
CO2: 21 mmol/L — AB (ref 22–32)
Calcium: 9 mg/dL (ref 8.9–10.3)
Creatinine, Ser: 0.79 mg/dL (ref 0.44–1.00)
GFR calc Af Amer: >60 mL/min (ref >60)
GFR calc non Af Amer: >60 mL/min (ref >60)
GLUCOSE: 118 mg/dL — AB (ref 65–99)
POTASSIUM: 3.4 mmol/L — AB (ref 3.5–5.1)
Sodium: 133 mmol/L — ABNORMAL LOW (ref 135–145)

## 2015-04-28 LAB — CBC WITH DIFFERENTIAL/PLATELET
Basophils Absolute: 0 10*3/uL (ref 0.0–0.1)
Basophils Relative: 0 %
EOS PCT: 2 %
Eosinophils Absolute: 0.1 10*3/uL (ref 0.0–0.7)
HEMATOCRIT: 44.2 % (ref 36.0–46.0)
HEMOGLOBIN: 15.2 g/dL — AB (ref 12.0–15.0)
LYMPHS ABS: 0.2 10*3/uL — AB (ref 0.7–4.0)
LYMPHS PCT: 3 %
MCH: 35 pg — ABNORMAL HIGH (ref 26.0–34.0)
MCHC: 34.4 g/dL (ref 30.0–36.0)
MCV: 101.8 fL — AB (ref 78.0–100.0)
Monocytes Absolute: 0.2 10*3/uL (ref 0.1–1.0)
Monocytes Relative: 3 %
Neutro Abs: 6 10*3/uL (ref 1.7–7.7)
Neutrophils Relative %: 92 %
PLATELETS: 188 10*3/uL (ref 150–400)
RBC: 4.34 MIL/uL (ref 3.87–5.11)
RDW: 12.5 % (ref 11.5–15.5)
WBC: 6.5 10*3/uL (ref 4.0–10.5)

## 2015-04-28 LAB — URINALYSIS, ROUTINE W REFLEX MICROSCOPIC
GLUCOSE, UA: NEGATIVE mg/dL
Ketones, ur: NEGATIVE mg/dL
Nitrite: NEGATIVE
PH: 5.5 (ref 5.0–8.0)
Protein, ur: NEGATIVE mg/dL
Specific Gravity, Urine: 1.019 (ref 1.005–1.030)
Urobilinogen, UA: 1 mg/dL (ref 0.0–1.0)

## 2015-04-28 LAB — URINE MICROSCOPIC-ADD ON

## 2015-04-28 LAB — POC URINE PREG, ED: Preg Test, Ur: NEGATIVE

## 2015-04-28 MED ORDER — PREDNISONE 20 MG PO TABS: ORAL_TABLET | ORAL | Status: DC

## 2015-04-28 MED ORDER — FOSFOMYCIN TROMETHAMINE 3 G PO PACK
3.0000 g | PACK | Freq: Once | ORAL | Status: AC
  Administered 2015-04-28: 3 g via ORAL
  Filled 2015-04-28: qty 3

## 2015-04-28 MED ORDER — DIPHENHYDRAMINE HCL 25 MG PO CAPS
50.0000 mg | ORAL_CAPSULE | Freq: Once | ORAL | Status: AC
  Administered 2015-04-28: 50 mg via ORAL
  Filled 2015-04-28: qty 2

## 2015-04-28 MED ORDER — PREDNISONE 20 MG PO TABS
60.0000 mg | ORAL_TABLET | Freq: Once | ORAL | Status: AC
  Administered 2015-04-28: 60 mg via ORAL
  Filled 2015-04-28: qty 3

## 2015-04-28 MED ORDER — FAMOTIDINE 20 MG PO TABS
40.0000 mg | ORAL_TABLET | Freq: Once | ORAL | Status: AC
  Administered 2015-04-28: 40 mg via ORAL
  Filled 2015-04-28: qty 2

## 2015-04-28 MED ORDER — SODIUM CHLORIDE 0.9 % IV BOLUS (SEPSIS)
1000.0000 mL | Freq: Once | INTRAVENOUS | Status: AC
  Administered 2015-04-28: 1000 mL via INTRAVENOUS

## 2015-04-28 NOTE — ED Notes (Signed)
Patient transported to X-ray 

## 2015-04-28 NOTE — ED Notes (Signed)
Has taken 2 doses of Macrobid, Friday and Saturday's doses. Noticed rash yesterday at 1500, generalized to extremities and trunk. No difficulty breathing, coughing or angioedema.

## 2015-04-28 NOTE — Discharge Instructions (Signed)

## 2015-04-28 NOTE — ED Provider Notes (Signed)
CSN: [id number]     Arrival date & time 04/28/15  1149 History   First MD Initiated Contact with Patient 04/28/15 1219     Chief Complaint  Patient presents with  . Allergic Reaction     (Consider location/radiation/quality/duration/timing/severity/associated sxs/prior Treatment) Patient is a 34 y.o. female presenting with allergic reaction. The history is provided by the patient.  Allergic Reaction Presenting symptoms: itching and rash   Presenting symptoms: no wheezing   Severity:  Mild Prior allergic episodes:  Plant allergies and allergies to medications Context comment:  Bactrim Relieved by:  Nothing Worsened by:  Nothing tried Ineffective treatments:  None tried  34 yo F with a chief complaint of allergic reaction. Patient has been having fevers and chills cough congestion and dysuria for the past 3 or 4 days. Saw her PCP was prescribed Bactrim for urinary tract infection. Patient has a known allergy to Bactrim and started having itching after this. Some mild lightheadedness denies shortness of breath wheezing abdominal pain vomiting or diarrhea.  Past Medical History  Diagnosis Date  . Bursitis of hip   . Hip joint pain   . Anxiety   . Chronic bronchitis   . Bipolar 1 disorder (HCC)   . Seizures [facility])    Past Surgical History  Procedure Laterality Date  . Knee arthroscopy    . Cesarean section      2002  . Mouth surgery  2010   Family History  Problem Relation Age of Onset  . Hypertension Mother   . Diabetes Other   . Cancer Other    Social History  Substance Use Topics  . Smoking status: Current Every Day Smoker -- 0.50 packs/day    Types: Cigarettes  . Smokeless tobacco: Never Used  . Alcohol Use: Yes     Comment: rarely   OB History    Gravida Para Term Preterm AB TAB SAB Ectopic Multiple Living   3 3             Review of Systems  Constitutional: Negative for fever and chills.  HENT: Negative for congestion and rhinorrhea.   Eyes: Negative for  redness and visual disturbance.  Respiratory: Negative for shortness of breath and wheezing.   Cardiovascular: Negative for chest pain and palpitations.  Gastrointestinal: Negative for nausea and vomiting.  Genitourinary: Negative for dysuria and urgency.  Musculoskeletal: Negative for myalgias and arthralgias.  Skin: Positive for color change, itching and rash. Negative for pallor and wound.  Neurological: Negative for dizziness and headaches.      Allergies  Cucumber extract; Keflex; Ketorolac tromethamine; Macrobid; Naproxen; Penicillins; Watermelon concentrate; Darvocet; Flagyl; Ketorolac; Ultracet; Ultram; and Vicodin  Home Medications   Prior to Admission medications   Medication Sig Start Date End Date Taking? Authorizing Provider  nitrofurantoin (MACRODANTIN) 100 MG capsule Take 100 mg by mouth 2 (two) times daily.   Yes Historical Provider, MD  omeprazole (PRILOSEC) 20 MG capsule Take 20 mg by mouth daily.   Yes Historical Provider, MD  ondansetron (ZOFRAN ODT) 4 MG disintegrating tablet Take 1 tablet (4 mg total) by mouth every 8 (eight) hours as needed for nausea. 03/22/15  Yes Jennifer Piepenbrink, PA-C  oxyCODONE-acetaminophen (PERCOCET/ROXICET) 5-325 MG per tablet Take 1 tablet by mouth every 6 (six) hours as needed for severe pain. 10/16/14  Yes Mercedes Camprubi-Soms, PA-C  predniSONE (DELTASONE) 20 MG tablet 2 tabs po daily x 3 days 04/28/15   Melene Plan, DO   BP 116/55 mmHg  Pulse 90  Temp(Src) 98.2 F (36.8 C) (Oral)  Resp 20  Ht  (1.676 m)  Wt 108 lb (48.988 kg)  BMI 17.44 kg/m2  SpO2 100%  LMP 03/30/2015 Physical Exam  Constitutional: She is oriented to person, place, and time. She appears well-developed and well-nourished. No distress.  HENT:  Head: Normocephalic and atraumatic.  Eyes: EOM are normal. Pupils are equal, round, and reactive to light.  Neck: Normal range of motion. Neck supple.  Cardiovascular: Normal rate and regular rhythm.  Exam reveals  no gallop and no friction rub.   No murmur heard. Pulmonary/Chest: Effort normal. She has no wheezes. She has no rales.  Abdominal: Soft. She exhibits no distension. There is no tenderness.  Musculoskeletal: She exhibits no edema or tenderness.  Neurological: She is alert and oriented to person, place, and time.  Skin: Skin is warm and dry. She is not diaphoretic.  Diffuse pleuritic rash worse to upper lower extremities, erythematous, palpable  Psychiatric: She has a normal mood and affect. Her behavior is normal.    ED Course  Procedures (including critical care time) Labs Review Labs Reviewed  CBC WITH DIFFERENTIAL/PLATELET - Abnormal; Notable for the following:    Hemoglobin 15.2 (*)    MCV 101.8 (*)    MCH 35.0 (*)    Lymphs Abs 0.2 (*)    All other components within normal limits  BASIC METABOLIC PANEL - Abnormal; Notable for the following:    Sodium 133 (*)    Potassium 3.4 (*)    CO2 21 (*)    Glucose, Bld 118 (*)    All other components within normal limits  URINALYSIS, ROUTINE W REFLEX MICROSCOPIC (NOT AT [facility]) - Abnormal; Notable for the following:    Color, Urine AMBER (*)    APPearance CLOUDY (*)    Hgb urine dipstick MODERATE (*)    Bilirubin Urine SMALL (*)    Leukocytes, UA SMALL (*)    All other components within normal limits  URINE MICROSCOPIC-ADD ON - Abnormal; Notable for the following:    Squamous Epithelial / LPF FEW (*)    Bacteria, UA FEW (*)    All other components within normal limits  POC URINE PREG, ED    Imaging Review Dg Chest 2 View  04/28/2015   CLINICAL DATA:  Cough and fever  EXAM: CHEST  2 VIEW  COMPARISON:  03/22/2015  FINDINGS: Pulmonary hyperinflation suggesting chronic lung disease. Lungs are clear. Negative for infiltrate or effusion. Heart size and vascularity normal.  IMPRESSION: Pulmonary hyperinflation.  No acute cardiopulmonary abnormality.   Electronically Signed   By: Marlan Palau M.D.   On: 04/28/2015 13:13   I have  personally reviewed and evaluated these images and lab results as part of my medical decision-making.   ED ECG REPORT   Date: 04/28/2015  Rate: 101  Rhythm: sinus tachycardia  QRS Axis: normal  Intervals: normal  ST/T Wave abnormalities: normal  Conduction Disutrbances:none  Narrative Interpretation:   Old EKG Reviewed: unchanged  I have personally reviewed the EKG tracing and agree with the computerized printout as noted.  MDM   Final diagnoses:  Allergic reaction caused by a drug  UTI (lower urinary tract infection)    34 yo F with allergic reaction. Symptoms significantly improved with steroids Benadryl and H1 blocker. Patient's heart rate improved with IV fluids. UA with possible UTI. Will treat with fosfomycin. PCP follow-up.  2:59 PM:  I have discussed the diagnosis/risks/treatment options with the patient and family and  believe the pt to be eligible for discharge home to follow-up with PCP. We also discussed returning to the ED immediately if new or worsening sx occur. We discussed the sx which are most concerning (e.g., sudden worsening) that necessitate immediate return. Medications administered to the patient during their visit and any new prescriptions provided to the patient are listed below.  Medications given during this visit Medications  sodium chloride 0.9 % bolus 1,000 mL (0 mLs Intravenous Stopped 04/28/15 1401)  diphenhydrAMINE (BENADRYL) capsule 50 mg (50 mg Oral Given 04/28/15 1253)  famotidine (PEPCID) tablet 40 mg (40 mg Oral Given 04/28/15 1253)  predniSONE (DELTASONE) tablet 60 mg (60 mg Oral Given 04/28/15 1253)  fosfomycin (MONUROL) packet 3 g (3 g Oral Given 04/28/15 1401)    Discharge Medication List as of 04/28/2015  2:07 PM       The patient appears reasonably screen and/or stabilized for discharge and I doubt any other medical condition or other [facility] requiring further screening, evaluation, or treatment in the ED at this time prior to discharge.       Melene Plan, DO 04/28/15 1501

## 2015-06-15 ENCOUNTER — Encounter (HOSPITAL_COMMUNITY): Payer: Self-pay

## 2015-06-15 ENCOUNTER — Emergency Department (HOSPITAL_COMMUNITY)
Admission: EM | Admit: 2015-06-15 | Discharge: 2015-06-15 | Disposition: A | Discharge status: Home / Self Care | Payer: Medicaid Other | Attending: Emergency Medicine | Admitting: Emergency Medicine

## 2015-06-15 DIAGNOSIS — Z9889 Other specified postprocedural states: Secondary | ICD-10-CM | POA: Diagnosis not present

## 2015-06-15 DIAGNOSIS — Z8709 Personal history of other diseases of the respiratory system: Secondary | ICD-10-CM | POA: Diagnosis not present

## 2015-06-15 DIAGNOSIS — Y998 Other external cause status: Secondary | ICD-10-CM | POA: Diagnosis not present

## 2015-06-15 DIAGNOSIS — S8992XA Unspecified injury of left lower leg, initial encounter: Secondary | ICD-10-CM | POA: Diagnosis not present

## 2015-06-15 DIAGNOSIS — G8929 Other chronic pain: Secondary | ICD-10-CM | POA: Diagnosis not present

## 2015-06-15 DIAGNOSIS — M25562 Pain in left knee: Secondary | ICD-10-CM

## 2015-06-15 DIAGNOSIS — Y92511 Restaurant or cafe as the place of occurrence of the external cause: Secondary | ICD-10-CM | POA: Insufficient documentation

## 2015-06-15 DIAGNOSIS — Z8659 Personal history of other mental and behavioral disorders: Secondary | ICD-10-CM | POA: Insufficient documentation

## 2015-06-15 DIAGNOSIS — F17210 Nicotine dependence, cigarettes, uncomplicated: Secondary | ICD-10-CM | POA: Insufficient documentation

## 2015-06-15 DIAGNOSIS — X58XXXA Exposure to other specified factors, initial encounter: Secondary | ICD-10-CM | POA: Diagnosis not present

## 2015-06-15 DIAGNOSIS — Z79899 Other long term (current) drug therapy: Secondary | ICD-10-CM | POA: Diagnosis not present

## 2015-06-15 DIAGNOSIS — Z8739 Personal history of other diseases of the musculoskeletal system and connective tissue: Secondary | ICD-10-CM | POA: Diagnosis not present

## 2015-06-15 DIAGNOSIS — Y9389 Activity, other specified: Secondary | ICD-10-CM | POA: Insufficient documentation

## 2015-06-15 DIAGNOSIS — Z880 Allergy status to penicillin: Secondary | ICD-10-CM | POA: Insufficient documentation

## 2015-06-15 NOTE — ED Notes (Signed)
Patient states that she was standing at work and felt a pop at the base of the left knee. Patient states she is now having pain left knee and posterior knee as well. Patient has increased pain when walking.

## 2015-06-15 NOTE — Discharge Instructions (Signed)
Read the information below.  You may return to the Emergency Department at any time for worsening condition or any new symptoms that concern you.  If you develop uncontrolled pain, weakness or numbness of the extremity, severe discoloration of the skin, or you are unable to move your knee, return to the ER for a recheck.     REST your knee, ICE it, use your knee sleeve for COMPRESSION, and ELEVATE it.  Take over the counter pain medications as needed for pain.  Please follow up with Dr Eulah Pont if you continue to have pain.   How to Use a Knee Brace A knee brace is a device that you wear to support your knee, especially if the knee is healing after an injury or surgery. There are several types of knee braces. Some are designed to prevent an injury (prophylactic brace). These are often worn during sports. Others support an injured knee (functional brace) or keep it still while it heals (rehabilitative brace). People with severe arthritis of the knee may benefit from a brace that takes some pressure off the knee (unloader brace). Most knee braces are made from a combination of cloth and metal or plastic.  You may need to wear a knee brace to:  Relieve knee pain.  Help your knee support your weight (improve stability).  Help you walk farther (improve mobility).  Prevent injury.  Support your knee while it heals from surgery or from an injury. RISKS AND COMPLICATIONS Generally, knee braces are very safe to wear. However, problems may occur, including:  Skin irritation that may lead to infection.  Making your condition worse if you wear the brace in the wrong way. HOW TO USE A KNEE BRACE Different braces will have different instructions for use. Your health care provider will tell you or show you:  How to put on your brace.  How to adjust the brace.  When and how often to wear the brace.  How to remove the brace.  If you will need any assistive devices in addition to the brace, such as  crutches or a cane. In general, your brace should:  Have the hinge of the brace line up with the bend of your knee.  Have straps, hooks, or tapes that fasten snugly around your leg.  Not feel too tight or too loose. HOW TO CARE FOR A KNEE BRACE  Check your brace often for signs of damage, such as loose connections or attachments. Your knee brace may get damaged or wear out during normal use.  Wash the fabric parts of your brace with soap and water.  Read the insert that comes with your brace for other specific care instructions. SEEK MEDICAL CARE IF:  Your knee brace is too loose or too tight and you cannot adjust it.  Your knee brace causes skin redness, swelling, bruising, or irritation.  Your knee brace is not helping.  Your knee brace is making your knee pain worse.   This information is not intended to replace advice given to you by your health care provider. Make sure you discuss any questions you have with your health care provider.   Document Released: 10/03/2003 Document Revised: 04/03/2015 Document Reviewed: 11/05/2014 Elsevier Interactive Patient Education 2016 [employer]  Knee Pain Knee pain is a very common symptom and can have many causes. Knee pain often goes away when you follow your health care provider's instructions for relieving pain and discomfort at home. However, knee pain can develop into a condition that  needs treatment. Some conditions may include:  Arthritis caused by wear and tear (osteoarthritis).  Arthritis caused by swelling and irritation (rheumatoid arthritis or gout).  A cyst or growth in your knee.  An infection in your knee joint.  An injury that will not heal.  Damage, swelling, or irritation of the tissues that support your knee (torn ligaments or tendinitis). If your knee pain continues, additional tests may be ordered to diagnose your condition. Tests may include X-rays or other imaging studies of your knee. You may also need  to have fluid removed from your knee. Treatment for ongoing knee pain depends on the cause, but treatment may include:  Medicines to relieve pain or swelling.  Steroid injections in your knee.  Physical therapy.  Surgery. HOME CARE INSTRUCTIONS  Take medicines only as directed by your health care provider.  Rest your knee and keep it raised (elevated) while you are resting.  Do not do things that cause or worsen pain.  Avoid high-impact activities or exercises, such as running, jumping rope, or doing jumping jacks.  Apply ice to the knee area:  Put ice in a plastic bag.  Place a towel between your skin and the bag.  Leave the ice on for 20 minutes, 2-3 times a day.  Ask your health care provider if you should wear an elastic knee support.  Keep a pillow under your knee when you sleep.  Lose weight if you are overweight. Extra weight can put pressure on your knee.  Do not use any tobacco products, including cigarettes, chewing tobacco, or electronic cigarettes. If you need help quitting, ask your health care provider. Smoking may slow the healing of any bone and joint problems that you may have. SEEK MEDICAL CARE IF:  Your knee pain continues, changes, or gets worse.  You have a fever along with knee pain.  Your knee buckles or locks up.  Your knee becomes more swollen. SEEK IMMEDIATE MEDICAL CARE IF:   Your knee joint feels hot to the touch.  You have chest pain or trouble breathing.   This information is not intended to replace advice given to you by your health care provider. Make sure you discuss any questions you have with your health care provider.   Document Released: 05/10/2007 Document Revised: 08/03/2014 Document Reviewed: 02/26/2014 Elsevier Interactive Patient Education [employer]

## 2015-06-15 NOTE — ED Provider Notes (Signed)
CSN: [id number]     Arrival date & time 06/15/15  1740 History  By signing my name below, I, Doreatha Martin, attest that this documentation has been prepared under the direction and in the presence of [facility], PA-C. Electronically Signed: Doreatha Martin, ED Scribe. 06/15/2015. 6:46 PM.    Chief Complaint  Patient presents with  . Knee Pain   The history is provided by the patient. No language interpreter was used.    HPI Comments: Jamie Shields is a 34 y.o. female with h/o left meniscus repair, left hip bursectomy who presents to the Emergency Department complaining of moderate, recurrent left knee pain onset this afternoon. Pt notes that she moved laterally at work at a [employer] when she heard her knee pop. She reports that she has had knee pain at baseline from a meniscus repair in 2009, but it was exacerbated today. She states that pain is worsened with weight bearing, movement or ambulation. No recent illness or infection. She denies any falls, specific injuries or trauma. She also denies weakness or numbness in the BLE, back pain or fever.   Past Medical History  Diagnosis Date  . Bursitis of hip   . Hip joint pain   . Anxiety   . Chronic bronchitis   . Bipolar 1 disorder (HCC)   . Seizures [facility])    Past Surgical History  Procedure Laterality Date  . Knee arthroscopy    . Cesarean section      2002  . Mouth surgery  2010   Family History  Problem Relation Age of Onset  . Hypertension Mother   . Diabetes Other   . Cancer Other    Social History  Substance Use Topics  . Smoking status: Current Every Day Smoker -- 0.50 packs/day    Types: Cigarettes  . Smokeless tobacco: Never Used  . Alcohol Use: Yes     Comment: rarely   OB History    Gravida Para Term Preterm AB TAB SAB Ectopic Multiple Living   3 3             Review of Systems  Constitutional: Negative for fever.  Cardiovascular: Negative for leg swelling.  Musculoskeletal: Positive for arthralgias.  Negative for back pain.  Skin: Negative for color change.  Allergic/Immunologic: Negative for immunocompromised state.  Neurological: Negative for weakness and numbness.  Hematological: Does not bruise/bleed easily.   Allergies  Cucumber extract; Keflex; Macrobid; Naproxen; Penicillins; Watermelon concentrate; Darvocet; Flagyl; Ketorolac; Ultram; and Vicodin  Home Medications   Prior to Admission medications   Medication Sig Start Date End Date Taking? Authorizing Provider  omeprazole (PRILOSEC) 20 MG capsule Take 20 mg by mouth daily.    Historical Provider, MD  ondansetron (ZOFRAN ODT) 4 MG disintegrating tablet Take 1 tablet (4 mg total) by mouth every 8 (eight) hours as needed for nausea. 03/22/15   Francee Piccolo, PA-C  oxyCODONE-acetaminophen (PERCOCET/ROXICET) 5-325 MG per tablet Take 1 tablet by mouth every 6 (six) hours as needed for severe pain. 10/16/14   Mercedes Camprubi-Soms, PA-C  predniSONE (DELTASONE) 20 MG tablet 2 tabs po daily x 3 days 04/28/15   Melene Plan, DO   BP 130/97 mmHg  Pulse 77  Temp(Src) 97.8 F (36.6 C) (Oral)  Resp 18  Ht  (1.676 m)  Wt 107 lb (48.535 kg)  BMI 17.28 kg/m2  SpO2 100%  LMP 05/28/2015 Physical Exam  Constitutional: She appears well-developed and well-nourished. No distress.  HENT:  Head: Normocephalic  and atraumatic.  Neck: Neck supple.  Pulmonary/Chest: Effort normal.  Musculoskeletal: She exhibits tenderness. She exhibits no edema.  Tenderness over the medial knee. No erythema, edema, or warmth of the joint. No effusion, no laxative joint with stress in any direction, No pain with axial loading. Pain with Thessaley test. DPs and sensation intact. No calf tenderness.   Neurological: She is alert.  Skin: She is not diaphoretic.  Nursing note and vitals reviewed.  ED Course  Procedures (including critical care time) DIAGNOSTIC STUDIES: Oxygen Saturation is 100% on RA, normal by my interpretation.    COORDINATION OF  CARE: 6:42 PM Discussed treatment plan with pt at bedside and pt agreed to plan. Plan to apply a knee sleeve brace and provide follow up with Orthopedist.    MDM   Final diagnoses:  Left knee pain    Afebrile, nontoxic patient with chronic knee pain with increased pain and "pop" felt in knee while moving laterally at work.   Xray not emergently indicated.  Exam unremarkable.   D/C home with knee sleeve, orthopedic follow up.  Pt declined crutches.   Discussed result, findings, treatment, and follow up  with patient.  Pt given return precautions.  Pt verbalizes understanding and agrees with plan.       I personally performed the services described in this documentation, which was scribed in my presence. The recorded information has been reviewed and is accurate.   Trixie Dredge, PA-C 06/15/15 1939  Benjiman Core, MD 06/15/15 [phone]

## 2015-08-07 ENCOUNTER — Emergency Department (HOSPITAL_COMMUNITY): Payer: Medicaid Other

## 2015-08-07 ENCOUNTER — Emergency Department (HOSPITAL_COMMUNITY)
Admission: EM | Admit: 2015-08-07 | Discharge: 2015-08-07 | Disposition: A | Discharge status: Home / Self Care | Payer: Medicaid Other | Attending: Emergency Medicine | Admitting: Emergency Medicine

## 2015-08-07 ENCOUNTER — Encounter (HOSPITAL_COMMUNITY): Payer: Self-pay

## 2015-08-07 DIAGNOSIS — R05 Cough: Secondary | ICD-10-CM | POA: Diagnosis present

## 2015-08-07 DIAGNOSIS — F319 Bipolar disorder, unspecified: Secondary | ICD-10-CM | POA: Diagnosis not present

## 2015-08-07 DIAGNOSIS — Z880 Allergy status to penicillin: Secondary | ICD-10-CM | POA: Insufficient documentation

## 2015-08-07 DIAGNOSIS — F17210 Nicotine dependence, cigarettes, uncomplicated: Secondary | ICD-10-CM | POA: Diagnosis not present

## 2015-08-07 DIAGNOSIS — J0100 Acute maxillary sinusitis, unspecified: Secondary | ICD-10-CM | POA: Diagnosis not present

## 2015-08-07 DIAGNOSIS — Z79899 Other long term (current) drug therapy: Secondary | ICD-10-CM | POA: Insufficient documentation

## 2015-08-07 DIAGNOSIS — Z8739 Personal history of other diseases of the musculoskeletal system and connective tissue: Secondary | ICD-10-CM | POA: Diagnosis not present

## 2015-08-07 MED ORDER — PROCHLORPERAZINE EDISYLATE 5 MG/ML IJ SOLN
10.0000 mg | Freq: Once | INTRAMUSCULAR | Status: AC
  Administered 2015-08-07: 10 mg via INTRAVENOUS
  Filled 2015-08-07: qty 2

## 2015-08-07 MED ORDER — DEXAMETHASONE SODIUM PHOSPHATE 10 MG/ML IJ SOLN
10.0000 mg | Freq: Once | INTRAMUSCULAR | Status: AC
  Administered 2015-08-07: 10 mg via INTRAVENOUS
  Filled 2015-08-07: qty 1

## 2015-08-07 MED ORDER — SODIUM CHLORIDE 0.9 % IV BOLUS (SEPSIS)
1000.0000 mL | Freq: Once | INTRAVENOUS | Status: AC
  Administered 2015-08-07: 1000 mL via INTRAVENOUS

## 2015-08-07 MED ORDER — AZITHROMYCIN 250 MG PO TABS
500.0000 mg | ORAL_TABLET | Freq: Once | ORAL | Status: AC
  Administered 2015-08-07: 500 mg via ORAL
  Filled 2015-08-07: qty 2

## 2015-08-07 MED ORDER — AZITHROMYCIN 250 MG PO TABS: 250.0000 mg | ORAL_TABLET | Freq: Every day | ORAL | Status: DC

## 2015-08-07 MED ORDER — DIPHENHYDRAMINE HCL 50 MG/ML IJ SOLN
25.0000 mg | Freq: Once | INTRAMUSCULAR | Status: AC
  Administered 2015-08-07: 25 mg via INTRAVENOUS
  Filled 2015-08-07: qty 1

## 2015-08-07 NOTE — ED Notes (Signed)
Pt with multiple complaints.  Checked in as headache with facial swelling.   Swelling x 3 days.  Headache x 1 week.  Cough, chills and shortness of breath x 1 month.  Evaluated at clinic and placed on inhaler and cough medicine.  Symptoms remain.

## 2015-08-07 NOTE — ED Provider Notes (Signed)
CSN: [id number]     Arrival date & time 08/07/15  1333 History   First MD Initiated Contact with Patient 08/07/15 1615     Chief Complaint  Patient presents with  . Cough  . Migraine     (Consider location/radiation/quality/duration/timing/severity/associated sxs/prior Treatment) Patient is a 35 y.o. female presenting with cough, migraines, and headaches.  Cough Associated symptoms: headaches and rhinorrhea   Associated symptoms: no sore throat   Migraine Associated symptoms include headaches.  Headache Pain location:  Frontal Quality:  Dull Onset quality:  Gradual Duration:  2 weeks Timing:  Constant Progression:  Worsening Chronicity:  New Similar to prior headaches: yes   Context: not activity and not eating   Relieved by:  None tried Associated symptoms: congestion, cough, drainage and sinus pressure   Associated symptoms: no sore throat     Past Medical History  Diagnosis Date  . Bursitis of hip   . Hip joint pain   . Anxiety   . Chronic bronchitis   . Bipolar 1 disorder (HCC)   . Seizures [facility])    Past Surgical History  Procedure Laterality Date  . Knee arthroscopy    . Cesarean section      2002  . Mouth surgery  2010   Family History  Problem Relation Age of Onset  . Hypertension Mother   . Diabetes Other   . Cancer Other    Social History  Substance Use Topics  . Smoking status: Current Every Day Smoker -- 0.50 packs/day    Types: Cigarettes  . Smokeless tobacco: Never Used  . Alcohol Use: Yes     Comment: rarely   OB History    Gravida Para Term Preterm AB TAB SAB Ectopic Multiple Living   3 3             Review of Systems  HENT: Positive for congestion, postnasal drip, rhinorrhea and sinus pressure. Negative for sore throat.   Respiratory: Positive for cough.   Neurological: Positive for headaches.      Allergies  Cucumber extract; Keflex; Macrobid; Naproxen; Penicillins; Watermelon concentrate; Darvocet; Flagyl; Ketorolac;  Ultram; and Vicodin  Home Medications   Prior to Admission medications   Medication Sig Start Date End Date Taking? Authorizing Provider  lipase/protease/amylase (CREON) 36000 UNITS CPEP capsule Take 36,000 Units by mouth 3 (three) times daily before meals.   Yes Historical Provider, MD  omeprazole (PRILOSEC) 20 MG capsule Take 20 mg by mouth daily as needed (FOR  REFLUX).    Yes Historical Provider, MD  PROVENTIL HFA 108 (90 Base) MCG/ACT inhaler Inhale 1 puff into the lungs every 4 (four) hours as needed. Shortness of breathe. 07/04/15  Yes Historical Provider, MD  pseudoephedrine-acetaminophen (TYLENOL SINUS) 30-500 MG TABS tablet Take 2 tablets by mouth 3 (three) times daily as needed (sinuses).   Yes Historical Provider, MD  SUMAtriptan (IMITREX) 100 MG tablet Take 100 mg by mouth. Daily repeat in two hours if symptoms still persist. 07/04/15  Yes Historical Provider, MD  topiramate (TOPAMAX) 25 MG tablet Take 25 mg by mouth 2 (two) times daily. 07/04/15  Yes Historical Provider, MD   BP 115/93 mmHg  Pulse 119  Temp(Src) 98.2 F (36.8 C) (Oral)  Resp 18  SpO2 95%  LMP 07/28/2015 Physical Exam  Constitutional: She appears well-developed and well-nourished.  HENT:  Head: Normocephalic and atraumatic.  Nose: Mucosal edema, rhinorrhea and sinus tenderness (right frontal and maxillary sinuses) present.  Mouth/Throat: Posterior oropharyngeal erythema present.  Neck: Normal  range of motion.  Cardiovascular: Normal rate and regular rhythm.   Pulmonary/Chest: No stridor. No respiratory distress.  Abdominal: She exhibits no distension.  Neurological: She is alert.  Nursing note and vitals reviewed.   ED Course  Procedures (including critical care time) Labs Review Labs Reviewed - No data to display  Imaging Review Dg Chest 2 View  08/07/2015  CLINICAL DATA:  Nonproductive cough for 1 month. Shortness of breath. Nausea. EXAM: CHEST  2 VIEW COMPARISON:  PA and lateral chest 04/28/2015  and 03/22/2015. FINDINGS: The chest is hyperexpanded with attenuation of the pulmonary vasculature and some bullous change in the apices. The lungs are clear. Heart size is normal. No pneumothorax or pleural effusion. IMPRESSION: No acute disease. Findings compatible with COPD. Electronically Signed   By: Drusilla Kanner M.D.   On: 08/07/2015 14:25   I have personally reviewed and evaluated these images and lab results as part of my medical decision-making.   EKG Interpretation None      MDM   Final diagnoses:  Acute maxillary sinusitis, recurrence not specified   Headache likely 2/2 sinus infection. Will treat both accordingly.  Headache improed with migraine cocktail. No need for imaging at this time. Doubt complications from sinus infection, will tx as outpatient with z pack as she has allegies to cephalosporins and penicillins.     Marily Memos, MD 08/08/15 [phone]

## 2016-05-13 ENCOUNTER — Emergency Department (HOSPITAL_COMMUNITY)
Admission: EM | Admit: 2016-05-13 | Discharge: 2016-05-13 | Discharge status: Against Medical Advice | Payer: Medicaid Other

## 2016-05-13 NOTE — ED Triage Notes (Signed)
Called pt for v/s No response 

## 2016-05-15 ENCOUNTER — Ambulatory Visit (INDEPENDENT_AMBULATORY_CARE_PROVIDER_SITE_OTHER): Payer: Medicaid Other | Admitting: Orthopaedic Surgery

## 2016-05-15 DIAGNOSIS — M545 Low back pain: Secondary | ICD-10-CM | POA: Diagnosis not present

## 2016-05-18 ENCOUNTER — Emergency Department (HOSPITAL_COMMUNITY)
Admission: EM | Admit: 2016-05-18 | Discharge: 2016-05-18 | Disposition: A | Discharge status: Home / Self Care | Payer: Medicaid Other | Attending: Emergency Medicine | Admitting: Emergency Medicine

## 2016-05-18 ENCOUNTER — Emergency Department (HOSPITAL_COMMUNITY): Payer: Medicaid Other

## 2016-05-18 ENCOUNTER — Encounter (HOSPITAL_COMMUNITY): Payer: Self-pay | Admitting: Emergency Medicine

## 2016-05-18 ENCOUNTER — Other Ambulatory Visit (INDEPENDENT_AMBULATORY_CARE_PROVIDER_SITE_OTHER): Payer: Self-pay | Admitting: Orthopaedic Surgery

## 2016-05-18 DIAGNOSIS — L089 Local infection of the skin and subcutaneous tissue, unspecified: Secondary | ICD-10-CM | POA: Diagnosis present

## 2016-05-18 DIAGNOSIS — M545 Low back pain, unspecified: Secondary | ICD-10-CM

## 2016-05-18 DIAGNOSIS — L0291 Cutaneous abscess, unspecified: Secondary | ICD-10-CM

## 2016-05-18 DIAGNOSIS — G8929 Other chronic pain: Secondary | ICD-10-CM

## 2016-05-18 DIAGNOSIS — F17210 Nicotine dependence, cigarettes, uncomplicated: Secondary | ICD-10-CM | POA: Diagnosis not present

## 2016-05-18 DIAGNOSIS — A1801 Tuberculosis of spine: Secondary | ICD-10-CM | POA: Diagnosis not present

## 2016-05-18 DIAGNOSIS — M5126 Other intervertebral disc displacement, lumbar region: Secondary | ICD-10-CM

## 2016-05-18 LAB — POC URINE PREG, ED: Preg Test, Ur: NEGATIVE

## 2016-05-18 MED ORDER — TRAMADOL HCL 50 MG PO TABS: 50.0000 mg | ORAL_TABLET | Freq: Four times a day (QID) | ORAL | 0 refills | Status: DC | PRN

## 2016-05-18 MED ORDER — LIDOCAINE HCL 2 % IJ SOLN
20.0000 mL | Freq: Once | INTRAMUSCULAR | Status: AC
  Administered 2016-05-18: 400 mg
  Filled 2016-05-18: qty 20

## 2016-05-18 MED ORDER — OXYCODONE-ACETAMINOPHEN 5-325 MG PO TABS
1.0000 | ORAL_TABLET | Freq: Once | ORAL | Status: AC
  Administered 2016-05-18: 1 via ORAL
  Filled 2016-05-18: qty 1

## 2016-05-18 MED ORDER — SULFAMETHOXAZOLE-TRIMETHOPRIM 800-160 MG PO TABS
1.0000 | ORAL_TABLET | Freq: Once | ORAL | Status: AC
  Administered 2016-05-18: 1 via ORAL
  Filled 2016-05-18: qty 1

## 2016-05-18 MED ORDER — SULFAMETHOXAZOLE-TRIMETHOPRIM 800-160 MG PO TABS: 1.0000 | ORAL_TABLET | Freq: Two times a day (BID) | ORAL | 0 refills | Status: AC

## 2016-05-18 NOTE — ED Provider Notes (Signed)
[facility]-EMERGENCY DEPT Provider Note   CSN: [id number] Arrival date & time: 05/18/16  1113     History   Chief Complaint Chief Complaint  Patient presents with  . Recurrent Skin Infections    HPI Jamie Shields is a 35 y.o. female.  HPI   35 year old female presents today with infection. Patient reports that one week ago she was pushed into counter by a large dog. She reports the pain over her sacrum, with surrounding redness. She notes slow spreading infection from that area well localized, warm to touch with significant tenderness. She reports that she did have a small amount of discharge 5 days ago, none since. 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 She notes superficial infection to bilateral fingertips where she cut it with a knife. He denies any history of chronic systemic disease including diabetes hypertension.  Past Medical History:  Diagnosis Date  . Anxiety   . Bipolar 1 disorder (HCC)   . Bursitis of hip   . Chronic bronchitis   . Hip joint pain   . Seizures [facility])     Patient Active Problem List   Diagnosis Date Noted  . Bursitis of hip     Past Surgical History:  Procedure Laterality Date  . CESAREAN SECTION     2002  . KNEE ARTHROSCOPY    . MOUTH SURGERY  2010    OB History    Gravida Para Term Preterm AB Living   3 3           SAB TAB Ectopic Multiple Live Births                   Home Medications    Prior to Admission medications   Medication Sig Start Date End Date Taking? Authorizing Provider  beclomethasone (QVAR) 40 MCG/ACT inhaler Inhale 1 puff into the lungs 2 (two) times daily.   Yes Historical Provider, MD  cyclobenzaprine (FLEXERIL) 10 MG tablet take 1 tablet twice daily 05/12/16  Yes Historical Provider, MD  gabapentin (NEURONTIN) 100 MG capsule take 1 capsule three times daily 05/02/16  Yes Historical Provider, MD  ibuprofen (ADVIL,MOTRIN)  200 MG tablet Take 600 mg by mouth every 6 (six) hours as needed for mild pain.   Yes Historical Provider, MD  PROVENTIL HFA 108 (90 Base) MCG/ACT inhaler Inhale 1 puff into the lungs every 4 (four) hours as needed. Shortness of breathe. 07/04/15  Yes Historical Provider, MD  azithromycin (ZITHROMAX) 250 MG tablet Take 1 tablet (250 mg total) by mouth daily. Take 1 every day until finished. Patient not taking: Reported on 05/18/2016 08/08/15   Marily Memos, MD  sulfamethoxazole-trimethoprim (BACTRIM DS,SEPTRA DS) 800-160 MG tablet Take 1 tablet by mouth 2 (two) times daily. 05/18/16 05/25/16  Eyvonne Mechanic, PA-C  traMADol (ULTRAM) 50 MG tablet Take 1 tablet (50 mg total) by mouth every 6 (six) hours as needed. 05/18/16   Eyvonne Mechanic, PA-C    Family History Family History  Problem Relation Age of Onset  . Hypertension Mother   . Diabetes Other   . Cancer Other     Social History Social History  Substance Use Topics  . Smoking status: Current Every Day Smoker    Packs/day: 0.50    Types: Cigarettes  . Smokeless tobacco: Never Used  . Alcohol use Yes     Comment: rarely     Allergies   Cucumber extract;  Keflex [cephalexin]; Macrobid [nitrofurantoin monohydrate macrocrystals]; Naproxen; Penicillins; Watermelon concentrate; Darvocet [propoxyphene n-acetaminophen]; Flagyl [metronidazole hcl]; Ketorolac; Ultram [tramadol hcl]; and Vicodin [hydrocodone-acetaminophen]   Review of Systems Review of Systems  All other systems reviewed and are negative.    Physical Exam Updated Vital Signs BP 117/84 (BP Location: Right Arm)   Pulse 111   Temp 98.7 F (37.1 C) (Oral)   Resp 17   Ht 5\' 6"  (1.676 m)   Wt 45.4 kg   LMP 04/03/2016 Comment: negative preg test 05/18/16  SpO2 94%   BMI 16.14 kg/m   Physical Exam  Constitutional: She is oriented to person, place, and time. She appears well-developed and well-nourished.  HENT:  Head: Normocephalic and atraumatic.  Eyes:  Conjunctivae are normal. Pupils are equal, round, and reactive to light. Right eye exhibits no discharge. Left eye exhibits no discharge. No scleral icterus.  Neck: Normal range of motion. No JVD present. No tracheal deviation present.  Pulmonary/Chest: Effort normal. No stridor.  Neurological: She is alert and oriented to person, place, and time. Coordination normal.  Skin:  3 cm area of redness, induration and tenderness to palpation over the sacrum, no discharge noted.  Psychiatric: She has a normal mood and affect. Her behavior is normal. Judgment and thought content normal.  Nursing note and vitals reviewed.    ED Treatments / Results  Labs (all labs ordered are listed, but only abnormal results are displayed) Labs Reviewed  POC URINE PREG, ED    EKG  EKG Interpretation None       Radiology Dg Sacrum/coccyx  Result Date: 05/18/2016 CLINICAL DATA:  Injury, knocked into a counter 1 week prior. Sacrococcygeal pain. EXAM: SACRUM AND COCCYX - 2+ VIEW COMPARISON:  02/22/2011 pelvic radiograph. FINDINGS: There is no evidence of fracture or other focal bone lesions. IMPRESSION: Negative. Electronically Signed   By: Delbert Phenix M.D.   On: 05/18/2016 13:04    Procedures Procedures (including critical care time)  INCISION AND DRAINAGE Performed by: Thermon Leyland Consent: Verbal consent obtained. Risks and benefits: risks, benefits and alternatives were discussed Type: abscess  Body area: sacrum  Anesthesia: local infiltration  Incision was made with a scalpel.  Local anesthetic: lidocaine 2% 0 epinephrine  Anesthetic total: 4 ml  Complexity: complex Blunt dissection to break up loculations  Drainage: purulent  Drainage amount: 2 cc   Patient tolerance: Patient tolerated the procedure well with no immediate complications.    Medications Ordered in ED Medications  oxyCODONE-acetaminophen (PERCOCET/ROXICET) 5-325 MG per tablet 1 tablet (not administered)   sulfamethoxazole-trimethoprim (BACTRIM DS,SEPTRA DS) 800-160 MG per tablet 1 tablet (not administered)  lidocaine (XYLOCAINE) 2 % (with pres) injection 400 mg (400 mg Infiltration Given 05/18/16 1208)     Initial Impression / Assessment and Plan / ED Course  I have reviewed the triage vital signs and the nursing notes.  Pertinent labs & imaging results that were available during my care of the patient were reviewed by me and considered in my medical decision making (see chart for details).  Clinical Course    Final Clinical Impressions(s) / ED Diagnoses   Final diagnoses:  Abscess    Labs:  Imaging:  Consults:  Therapeutics:  Discharge Meds:   Assessment/Plan:  35 year old female presents today with abscess over her sacrum. 90 provided significant drainage. Patient's afebrile and nontoxic. No signs of systemic illness. She'll be started on Bactrim given a course pain medication, encouraged follow-up with her primary care or the emergency room in 3  days for reevaluation. She verbalized understanding and agreement to today's plan had no further questions or concerns at time of discharge       New Prescriptions New Prescriptions   SULFAMETHOXAZOLE-TRIMETHOPRIM (BACTRIM DS,SEPTRA DS) 800-160 MG TABLET    Take 1 tablet by mouth 2 (two) times daily.   TRAMADOL (ULTRAM) 50 MG TABLET    Take 1 tablet (50 mg total) by mouth every 6 (six) hours as needed.     Eyvonne MechanicJeffrey Correna Meacham, PA-C 05/18/16 1400    Nira ConnPedro Eduardo Cardama, MD 05/18/16 [phone]

## 2016-05-18 NOTE — Discharge Instructions (Signed)
Please read attached information. If you experience any new or worsening signs or symptoms please return to the emergency room for evaluation. Please follow-up with your primary care provider or specialist as discussed. Please use medication prescribed only as directed and discontinue taking if you have any concerning signs or symptoms.   °

## 2016-05-18 NOTE — ED Triage Notes (Signed)
Patient states that for over week having sore area on top of buttocks or about week that has drained yellowish fluid couple days ago.  Patient believes that she is getting another spot like this one on her buttock as well.. Patient also adds that her cuts on her hands are changing colors.

## 2016-05-30 ENCOUNTER — Inpatient Hospital Stay: Admission: RE | Admit: 2016-05-30 | Payer: Medicaid Other | Source: Ambulatory Visit

## 2016-06-05 ENCOUNTER — Ambulatory Visit (INDEPENDENT_AMBULATORY_CARE_PROVIDER_SITE_OTHER): Payer: Medicaid Other | Admitting: Orthopaedic Surgery

## 2016-08-31 ENCOUNTER — Other Ambulatory Visit: Payer: Self-pay | Admitting: Neurology

## 2016-08-31 DIAGNOSIS — R569 Unspecified convulsions: Secondary | ICD-10-CM

## 2016-09-06 DIAGNOSIS — J449 Chronic obstructive pulmonary disease, unspecified: Secondary | ICD-10-CM

## 2016-09-06 DIAGNOSIS — N390 Urinary tract infection, site not specified: Secondary | ICD-10-CM | POA: Diagnosis not present

## 2016-09-06 DIAGNOSIS — N179 Acute kidney failure, unspecified: Secondary | ICD-10-CM | POA: Diagnosis not present

## 2016-09-06 DIAGNOSIS — Z87898 Personal history of other specified conditions: Secondary | ICD-10-CM

## 2016-09-06 DIAGNOSIS — K219 Gastro-esophageal reflux disease without esophagitis: Secondary | ICD-10-CM

## 2016-09-06 DIAGNOSIS — J189 Pneumonia, unspecified organism: Secondary | ICD-10-CM | POA: Diagnosis not present

## 2016-09-06 DIAGNOSIS — M6282 Rhabdomyolysis: Secondary | ICD-10-CM | POA: Diagnosis not present

## 2016-09-06 DIAGNOSIS — E878 Other disorders of electrolyte and fluid balance, not elsewhere classified: Secondary | ICD-10-CM | POA: Diagnosis not present

## 2016-09-07 DIAGNOSIS — Z87898 Personal history of other specified conditions: Secondary | ICD-10-CM | POA: Diagnosis not present

## 2016-09-07 DIAGNOSIS — J449 Chronic obstructive pulmonary disease, unspecified: Secondary | ICD-10-CM | POA: Diagnosis not present

## 2016-09-07 DIAGNOSIS — K219 Gastro-esophageal reflux disease without esophagitis: Secondary | ICD-10-CM | POA: Diagnosis not present

## 2016-09-07 DIAGNOSIS — J189 Pneumonia, unspecified organism: Secondary | ICD-10-CM | POA: Diagnosis not present

## 2016-09-07 DIAGNOSIS — R109 Unspecified abdominal pain: Secondary | ICD-10-CM

## 2016-09-08 DIAGNOSIS — Z87898 Personal history of other specified conditions: Secondary | ICD-10-CM | POA: Diagnosis not present

## 2016-09-08 DIAGNOSIS — J189 Pneumonia, unspecified organism: Secondary | ICD-10-CM | POA: Diagnosis not present

## 2016-09-08 DIAGNOSIS — J449 Chronic obstructive pulmonary disease, unspecified: Secondary | ICD-10-CM | POA: Diagnosis not present

## 2016-09-08 DIAGNOSIS — K219 Gastro-esophageal reflux disease without esophagitis: Secondary | ICD-10-CM | POA: Diagnosis not present

## 2016-09-09 ENCOUNTER — Ambulatory Visit (HOSPITAL_COMMUNITY): Payer: Medicaid Other

## 2016-09-09 DIAGNOSIS — J9601 Acute respiratory failure with hypoxia: Secondary | ICD-10-CM

## 2016-09-09 DIAGNOSIS — J189 Pneumonia, unspecified organism: Secondary | ICD-10-CM

## 2016-09-09 DIAGNOSIS — K219 Gastro-esophageal reflux disease without esophagitis: Secondary | ICD-10-CM | POA: Diagnosis not present

## 2016-09-09 DIAGNOSIS — Z87898 Personal history of other specified conditions: Secondary | ICD-10-CM | POA: Diagnosis not present

## 2016-09-09 DIAGNOSIS — J449 Chronic obstructive pulmonary disease, unspecified: Secondary | ICD-10-CM

## 2016-09-09 DIAGNOSIS — E878 Other disorders of electrolyte and fluid balance, not elsewhere classified: Secondary | ICD-10-CM

## 2016-09-09 DIAGNOSIS — R109 Unspecified abdominal pain: Secondary | ICD-10-CM

## 2016-09-10 DIAGNOSIS — K219 Gastro-esophageal reflux disease without esophagitis: Secondary | ICD-10-CM | POA: Diagnosis not present

## 2016-09-10 DIAGNOSIS — R109 Unspecified abdominal pain: Secondary | ICD-10-CM | POA: Diagnosis not present

## 2016-09-10 DIAGNOSIS — J9601 Acute respiratory failure with hypoxia: Secondary | ICD-10-CM | POA: Diagnosis not present

## 2016-09-10 DIAGNOSIS — J189 Pneumonia, unspecified organism: Secondary | ICD-10-CM | POA: Diagnosis not present

## 2016-09-11 DIAGNOSIS — J9601 Acute respiratory failure with hypoxia: Secondary | ICD-10-CM | POA: Diagnosis not present

## 2016-09-11 DIAGNOSIS — K219 Gastro-esophageal reflux disease without esophagitis: Secondary | ICD-10-CM | POA: Diagnosis not present

## 2016-09-11 DIAGNOSIS — R109 Unspecified abdominal pain: Secondary | ICD-10-CM | POA: Diagnosis not present

## 2016-09-11 DIAGNOSIS — J189 Pneumonia, unspecified organism: Secondary | ICD-10-CM | POA: Diagnosis not present

## 2016-09-12 DIAGNOSIS — R109 Unspecified abdominal pain: Secondary | ICD-10-CM | POA: Diagnosis not present

## 2016-09-12 DIAGNOSIS — K219 Gastro-esophageal reflux disease without esophagitis: Secondary | ICD-10-CM | POA: Diagnosis not present

## 2016-09-12 DIAGNOSIS — J189 Pneumonia, unspecified organism: Secondary | ICD-10-CM | POA: Diagnosis not present

## 2016-09-12 DIAGNOSIS — J9601 Acute respiratory failure with hypoxia: Secondary | ICD-10-CM | POA: Diagnosis not present

## 2016-09-13 DIAGNOSIS — K219 Gastro-esophageal reflux disease without esophagitis: Secondary | ICD-10-CM | POA: Diagnosis not present

## 2016-09-13 DIAGNOSIS — J9601 Acute respiratory failure with hypoxia: Secondary | ICD-10-CM | POA: Diagnosis not present

## 2016-09-13 DIAGNOSIS — J189 Pneumonia, unspecified organism: Secondary | ICD-10-CM | POA: Diagnosis not present

## 2016-09-13 DIAGNOSIS — R109 Unspecified abdominal pain: Secondary | ICD-10-CM | POA: Diagnosis not present

## 2016-09-14 DIAGNOSIS — K219 Gastro-esophageal reflux disease without esophagitis: Secondary | ICD-10-CM | POA: Diagnosis not present

## 2016-09-14 DIAGNOSIS — J189 Pneumonia, unspecified organism: Secondary | ICD-10-CM | POA: Diagnosis not present

## 2016-09-14 DIAGNOSIS — R109 Unspecified abdominal pain: Secondary | ICD-10-CM | POA: Diagnosis not present

## 2016-09-14 DIAGNOSIS — J9601 Acute respiratory failure with hypoxia: Secondary | ICD-10-CM | POA: Diagnosis not present

## 2016-09-16 DIAGNOSIS — Z87898 Personal history of other specified conditions: Secondary | ICD-10-CM | POA: Diagnosis not present

## 2016-09-16 DIAGNOSIS — R109 Unspecified abdominal pain: Secondary | ICD-10-CM | POA: Diagnosis not present

## 2016-09-16 DIAGNOSIS — E878 Other disorders of electrolyte and fluid balance, not elsewhere classified: Secondary | ICD-10-CM | POA: Diagnosis not present

## 2016-09-16 DIAGNOSIS — J9601 Acute respiratory failure with hypoxia: Secondary | ICD-10-CM

## 2016-09-16 DIAGNOSIS — J449 Chronic obstructive pulmonary disease, unspecified: Secondary | ICD-10-CM

## 2016-09-16 DIAGNOSIS — K219 Gastro-esophageal reflux disease without esophagitis: Secondary | ICD-10-CM

## 2016-09-16 DIAGNOSIS — J189 Pneumonia, unspecified organism: Secondary | ICD-10-CM | POA: Diagnosis not present

## 2016-09-17 DIAGNOSIS — K219 Gastro-esophageal reflux disease without esophagitis: Secondary | ICD-10-CM | POA: Diagnosis not present

## 2016-09-17 DIAGNOSIS — J189 Pneumonia, unspecified organism: Secondary | ICD-10-CM | POA: Diagnosis not present

## 2016-09-17 DIAGNOSIS — J9601 Acute respiratory failure with hypoxia: Secondary | ICD-10-CM | POA: Diagnosis not present

## 2016-09-17 DIAGNOSIS — R109 Unspecified abdominal pain: Secondary | ICD-10-CM | POA: Diagnosis not present

## 2016-09-18 DIAGNOSIS — J9601 Acute respiratory failure with hypoxia: Secondary | ICD-10-CM | POA: Diagnosis not present

## 2016-09-18 DIAGNOSIS — R109 Unspecified abdominal pain: Secondary | ICD-10-CM | POA: Diagnosis not present

## 2016-09-18 DIAGNOSIS — J189 Pneumonia, unspecified organism: Secondary | ICD-10-CM | POA: Diagnosis not present

## 2016-09-18 DIAGNOSIS — K219 Gastro-esophageal reflux disease without esophagitis: Secondary | ICD-10-CM | POA: Diagnosis not present

## 2016-10-20 ENCOUNTER — Ambulatory Visit (HOSPITAL_COMMUNITY)
Admission: RE | Admit: 2016-10-20 | Discharge: 2016-10-20 | Disposition: A | Discharge status: Home / Self Care | Payer: Medicaid Other | Source: Ambulatory Visit | Attending: Neurology | Admitting: Neurology

## 2016-10-20 DIAGNOSIS — R569 Unspecified convulsions: Secondary | ICD-10-CM | POA: Diagnosis not present

## 2016-10-20 DIAGNOSIS — R9401 Abnormal electroencephalogram [EEG]: Secondary | ICD-10-CM | POA: Insufficient documentation

## 2016-10-20 NOTE — Progress Notes (Signed)
EEG Completed; Results Pending  

## 2016-10-20 NOTE — Procedures (Addendum)
ELECTROENCEPHALOGRAM REPORT  Date of Study: 10/20/2016  Patient's Name: Jamie Shields MRN: [id number] Date of Birth: 05/26/1981  Referring Provider: Dr. Oleh Genin  Clinical History: This is a 36 year old woman with seizures.  Medications: gabapentin (NEURONTIN) 100 MG capsule  azithromycin (ZITHROMAX) 250 MG tablet   beclomethasone (QVAR) 40 MCG/ACT inhaler  cyclobenzaprine (FLEXERIL) 10 MG tablet  ibuprofen (ADVIL,MOTRIN) 200 MG tablet  PROVENTIL HFA 108 (90 Base) MCG/ACT inhaler  traMADol (ULTRAM) 50 MG tablet   Technical Summary: A multichannel digital EEG recording measured by the international 10-20 system with electrodes applied with paste and impedances below 5000 ohms performed in our laboratory with EKG monitoring in an awake and asleep patient.  Hyperventilation and photic stimulation were performed.  The digital EEG was referentially recorded, reformatted, and digitally filtered in a variety of bipolar and referential montages for optimal display.    Description: The patient is awake and asleep during the recording.  During maximal wakefulness, there is a symmetric, medium voltage 9 Hz posterior dominant rhythm that attenuates with eye opening.  There is occasional focal 4-5 Hz theta slowing over the right posterior temporal region. During drowsiness and sleep, there is an increase in theta slowing of the background.  Vertex waves and symmetric sleep spindles were seen.  Hyperventilation and photic stimulation did not elicit any abnormalities.  There were no epileptiform discharges or electrographic seizures seen.    Technical difficulties with EKG lead.  Impression: This awake and asleep EEG is abnormal due to occasional focal slowing over the right posterior temporal region.  Clinical Correlation of the above findings indicates focal cerebral dysfunction over the right posterior temporal region suggestive of underlying structural or physiologic abnormality. The  absence of epileptiform discharges does not exclude a clinical diagnosis of epilepsy. Clinical correlation is advised.    Patrcia Dolly, MD

## 2020-01-25 DIAGNOSIS — Z419 Encounter for procedure for purposes other than remedying health state, unspecified: Secondary | ICD-10-CM | POA: Diagnosis not present

## 2020-02-25 DIAGNOSIS — Z419 Encounter for procedure for purposes other than remedying health state, unspecified: Secondary | ICD-10-CM | POA: Diagnosis not present

## 2020-03-27 DIAGNOSIS — Z419 Encounter for procedure for purposes other than remedying health state, unspecified: Secondary | ICD-10-CM | POA: Diagnosis not present

## 2020-04-26 DIAGNOSIS — Z419 Encounter for procedure for purposes other than remedying health state, unspecified: Secondary | ICD-10-CM | POA: Diagnosis not present

## 2020-05-27 DIAGNOSIS — Z419 Encounter for procedure for purposes other than remedying health state, unspecified: Secondary | ICD-10-CM | POA: Diagnosis not present

## 2020-06-26 DIAGNOSIS — Z419 Encounter for procedure for purposes other than remedying health state, unspecified: Secondary | ICD-10-CM | POA: Diagnosis not present

## 2020-07-27 DIAGNOSIS — Z419 Encounter for procedure for purposes other than remedying health state, unspecified: Secondary | ICD-10-CM | POA: Diagnosis not present

## 2020-08-27 DIAGNOSIS — Z419 Encounter for procedure for purposes other than remedying health state, unspecified: Secondary | ICD-10-CM | POA: Diagnosis not present

## 2020-09-24 DIAGNOSIS — Z419 Encounter for procedure for purposes other than remedying health state, unspecified: Secondary | ICD-10-CM | POA: Diagnosis not present

## 2020-10-25 DIAGNOSIS — Z419 Encounter for procedure for purposes other than remedying health state, unspecified: Secondary | ICD-10-CM | POA: Diagnosis not present

## 2020-11-24 DIAGNOSIS — Z419 Encounter for procedure for purposes other than remedying health state, unspecified: Secondary | ICD-10-CM | POA: Diagnosis not present

## 2020-12-25 DIAGNOSIS — Z419 Encounter for procedure for purposes other than remedying health state, unspecified: Secondary | ICD-10-CM | POA: Diagnosis not present

## 2021-01-24 DIAGNOSIS — Z419 Encounter for procedure for purposes other than remedying health state, unspecified: Secondary | ICD-10-CM | POA: Diagnosis not present

## 2021-02-24 DIAGNOSIS — Z419 Encounter for procedure for purposes other than remedying health state, unspecified: Secondary | ICD-10-CM | POA: Diagnosis not present

## 2021-03-27 DIAGNOSIS — Z419 Encounter for procedure for purposes other than remedying health state, unspecified: Secondary | ICD-10-CM | POA: Diagnosis not present

## 2021-04-26 DIAGNOSIS — Z419 Encounter for procedure for purposes other than remedying health state, unspecified: Secondary | ICD-10-CM | POA: Diagnosis not present

## 2021-05-27 DIAGNOSIS — Z419 Encounter for procedure for purposes other than remedying health state, unspecified: Secondary | ICD-10-CM | POA: Diagnosis not present

## 2021-06-26 DIAGNOSIS — Z419 Encounter for procedure for purposes other than remedying health state, unspecified: Secondary | ICD-10-CM | POA: Diagnosis not present

## 2021-07-27 DIAGNOSIS — Z419 Encounter for procedure for purposes other than remedying health state, unspecified: Secondary | ICD-10-CM | POA: Diagnosis not present

## 2021-08-27 DIAGNOSIS — Z419 Encounter for procedure for purposes other than remedying health state, unspecified: Secondary | ICD-10-CM | POA: Diagnosis not present

## 2021-09-24 DIAGNOSIS — Z419 Encounter for procedure for purposes other than remedying health state, unspecified: Secondary | ICD-10-CM | POA: Diagnosis not present

## 2021-10-25 DIAGNOSIS — Z419 Encounter for procedure for purposes other than remedying health state, unspecified: Secondary | ICD-10-CM | POA: Diagnosis not present

## 2021-11-24 DIAGNOSIS — Z419 Encounter for procedure for purposes other than remedying health state, unspecified: Secondary | ICD-10-CM | POA: Diagnosis not present

## 2021-12-25 DIAGNOSIS — Z419 Encounter for procedure for purposes other than remedying health state, unspecified: Secondary | ICD-10-CM | POA: Diagnosis not present

## 2022-01-21 DIAGNOSIS — Z13220 Encounter for screening for lipoid disorders: Secondary | ICD-10-CM | POA: Diagnosis not present

## 2022-01-21 DIAGNOSIS — Z131 Encounter for screening for diabetes mellitus: Secondary | ICD-10-CM | POA: Diagnosis not present

## 2022-01-21 DIAGNOSIS — G43909 Migraine, unspecified, not intractable, without status migrainosus: Secondary | ICD-10-CM | POA: Diagnosis not present

## 2022-01-21 DIAGNOSIS — G40909 Epilepsy, unspecified, not intractable, without status epilepticus: Secondary | ICD-10-CM | POA: Diagnosis not present

## 2022-01-24 DIAGNOSIS — Z419 Encounter for procedure for purposes other than remedying health state, unspecified: Secondary | ICD-10-CM | POA: Diagnosis not present

## 2022-02-06 ENCOUNTER — Encounter: Payer: Self-pay | Admitting: Neurology

## 2022-02-06 DIAGNOSIS — G40909 Epilepsy, unspecified, not intractable, without status epilepticus: Secondary | ICD-10-CM | POA: Diagnosis not present

## 2022-02-24 DIAGNOSIS — Z419 Encounter for procedure for purposes other than remedying health state, unspecified: Secondary | ICD-10-CM | POA: Diagnosis not present

## 2022-02-25 ENCOUNTER — Ambulatory Visit: Payer: Medicaid Other | Admitting: Neurology

## 2022-02-25 ENCOUNTER — Encounter: Payer: Self-pay | Admitting: Neurology

## 2022-02-26 DIAGNOSIS — F419 Anxiety disorder, unspecified: Secondary | ICD-10-CM | POA: Diagnosis not present

## 2022-02-26 DIAGNOSIS — G40909 Epilepsy, unspecified, not intractable, without status epilepticus: Secondary | ICD-10-CM | POA: Diagnosis not present

## 2022-03-02 DIAGNOSIS — G40909 Epilepsy, unspecified, not intractable, without status epilepticus: Secondary | ICD-10-CM | POA: Diagnosis not present

## 2022-03-02 DIAGNOSIS — F419 Anxiety disorder, unspecified: Secondary | ICD-10-CM | POA: Diagnosis not present

## 2022-03-16 DIAGNOSIS — F419 Anxiety disorder, unspecified: Secondary | ICD-10-CM | POA: Diagnosis not present

## 2022-03-27 DIAGNOSIS — Z419 Encounter for procedure for purposes other than remedying health state, unspecified: Secondary | ICD-10-CM | POA: Diagnosis not present

## 2022-04-03 DIAGNOSIS — H748X1 Other specified disorders of right middle ear and mastoid: Secondary | ICD-10-CM | POA: Diagnosis not present

## 2022-04-03 DIAGNOSIS — G40909 Epilepsy, unspecified, not intractable, without status epilepticus: Secondary | ICD-10-CM | POA: Diagnosis not present

## 2022-04-17 ENCOUNTER — Other Ambulatory Visit: Payer: Self-pay | Admitting: Internal Medicine

## 2022-04-17 DIAGNOSIS — F419 Anxiety disorder, unspecified: Secondary | ICD-10-CM | POA: Diagnosis not present

## 2022-04-17 DIAGNOSIS — G40909 Epilepsy, unspecified, not intractable, without status epilepticus: Secondary | ICD-10-CM | POA: Diagnosis not present

## 2022-04-17 DIAGNOSIS — N390 Urinary tract infection, site not specified: Secondary | ICD-10-CM | POA: Diagnosis not present

## 2022-04-20 LAB — URINE CULTURE
MICRO NUMBER:: 13956067
SPECIMEN QUALITY:: ADEQUATE

## 2022-04-21 ENCOUNTER — Emergency Department (HOSPITAL_COMMUNITY)
Admission: EM | Admit: 2022-04-21 | Discharge: 2022-04-22 | Discharge status: Against Medical Advice | Payer: Medicaid Other | Attending: Physician Assistant | Admitting: Physician Assistant

## 2022-04-21 ENCOUNTER — Encounter (HOSPITAL_COMMUNITY): Payer: Self-pay

## 2022-04-21 DIAGNOSIS — N390 Urinary tract infection, site not specified: Secondary | ICD-10-CM | POA: Diagnosis not present

## 2022-04-21 DIAGNOSIS — M791 Myalgia, unspecified site: Secondary | ICD-10-CM | POA: Insufficient documentation

## 2022-04-21 DIAGNOSIS — Z5321 Procedure and treatment not carried out due to patient leaving prior to being seen by health care provider: Secondary | ICD-10-CM | POA: Insufficient documentation

## 2022-04-21 DIAGNOSIS — R110 Nausea: Secondary | ICD-10-CM | POA: Insufficient documentation

## 2022-04-21 LAB — URINALYSIS, ROUTINE W REFLEX MICROSCOPIC
Bilirubin Urine: NEGATIVE
Glucose, UA: NEGATIVE mg/dL
Ketones, ur: 5 mg/dL — AB
Nitrite: NEGATIVE
Protein, ur: NEGATIVE mg/dL
Specific Gravity, Urine: 1.012 (ref 1.005–1.030)
WBC, UA: >50 WBC/hpf — ABNORMAL HIGH (ref 0–5)
pH: 5 (ref 5.0–8.0)

## 2022-04-21 NOTE — ED Provider Triage Note (Signed)
Emergency Medicine Provider Triage Evaluation Note  Jamie Shields , a 41 y.o. female  was evaluated in triage.  Pt complains of urinary tract infection for the past week and a half.  Reports she was prescribed nitrofurantoin which she has been taking for a week, there has not been any improvement in her symptoms.  Upon discussing antibiotic with patient, she states that she is allergic to [name].  He also endorses nausea, this is happening after every thing she tries to eat, has not eaten in about a week.  Also endorsing generalized body aches..  Review of Systems  Positive: Urinary symptoms, nausea Negative: Fever, back pain  Physical Exam  BP 101/60 (BP Location: Left Arm)   Pulse (!) 118   Temp 98.5 F (36.9 C) (Oral)   Resp 18   SpO2 100%  Gen:   Awake, no distress   Resp:  Normal effort  MSK:   Moves extremities without difficulty  Other:    Medical Decision Making  Medically screening exam initiated at 4:32 PM.  Appropriate orders placed.  Jamie Shields was informed that the remainder of the evaluation will be completed by another provider, this initial triage assessment does not replace that evaluation, and the importance of remaining in the ED until their evaluation is complete.     Janeece Fitting, PA-C 04/21/22 1637

## 2022-04-21 NOTE — ED Triage Notes (Signed)
Pt arrived via POV, c/o vomiting, states dx with UTI has been on PO abx with no relief of sx. Worsening vomiting, told by PCP to come for IV abx

## 2022-04-23 DIAGNOSIS — N390 Urinary tract infection, site not specified: Secondary | ICD-10-CM | POA: Diagnosis not present

## 2022-04-23 DIAGNOSIS — M25569 Pain in unspecified knee: Secondary | ICD-10-CM | POA: Diagnosis not present

## 2022-04-24 ENCOUNTER — Ambulatory Visit (HOSPITAL_COMMUNITY): Payer: Medicaid Other

## 2022-04-26 DIAGNOSIS — Z419 Encounter for procedure for purposes other than remedying health state, unspecified: Secondary | ICD-10-CM | POA: Diagnosis not present

## 2022-05-01 ENCOUNTER — Other Ambulatory Visit: Payer: Self-pay

## 2022-05-01 ENCOUNTER — Emergency Department (HOSPITAL_COMMUNITY): Payer: Medicaid Other

## 2022-05-01 ENCOUNTER — Inpatient Hospital Stay (HOSPITAL_COMMUNITY)
Admission: EM | Admit: 2022-05-01 | Discharge: 2022-05-14 | DRG: 871 | Disposition: A | Discharge status: Home / Self Care | Payer: Medicaid Other | Attending: Internal Medicine | Admitting: Internal Medicine

## 2022-05-01 DIAGNOSIS — E871 Hypo-osmolality and hyponatremia: Secondary | ICD-10-CM | POA: Diagnosis not present

## 2022-05-01 DIAGNOSIS — R202 Paresthesia of skin: Secondary | ICD-10-CM | POA: Diagnosis present

## 2022-05-01 DIAGNOSIS — N179 Acute kidney failure, unspecified: Secondary | ICD-10-CM

## 2022-05-01 DIAGNOSIS — Z20822 Contact with and (suspected) exposure to covid-19: Secondary | ICD-10-CM | POA: Diagnosis not present

## 2022-05-01 DIAGNOSIS — I351 Nonrheumatic aortic (valve) insufficiency: Secondary | ICD-10-CM | POA: Diagnosis not present

## 2022-05-01 DIAGNOSIS — G8929 Other chronic pain: Secondary | ICD-10-CM | POA: Diagnosis present

## 2022-05-01 DIAGNOSIS — Z452 Encounter for adjustment and management of vascular access device: Secondary | ICD-10-CM | POA: Diagnosis not present

## 2022-05-01 DIAGNOSIS — Z8379 Family history of other diseases of the digestive system: Secondary | ICD-10-CM

## 2022-05-01 DIAGNOSIS — Z91410 Personal history of adult physical and sexual abuse: Secondary | ICD-10-CM

## 2022-05-01 DIAGNOSIS — E2740 Unspecified adrenocortical insufficiency: Secondary | ICD-10-CM | POA: Diagnosis present

## 2022-05-01 DIAGNOSIS — Y900 Blood alcohol level of less than 20 mg/100 ml: Secondary | ICD-10-CM | POA: Diagnosis present

## 2022-05-01 DIAGNOSIS — Z8701 Personal history of pneumonia (recurrent): Secondary | ICD-10-CM

## 2022-05-01 DIAGNOSIS — E8770 Fluid overload, unspecified: Secondary | ICD-10-CM | POA: Diagnosis not present

## 2022-05-01 DIAGNOSIS — K7200 Acute and subacute hepatic failure without coma: Secondary | ICD-10-CM | POA: Diagnosis present

## 2022-05-01 DIAGNOSIS — G40909 Epilepsy, unspecified, not intractable, without status epilepticus: Secondary | ICD-10-CM

## 2022-05-01 DIAGNOSIS — J90 Pleural effusion, not elsewhere classified: Secondary | ICD-10-CM | POA: Diagnosis not present

## 2022-05-01 DIAGNOSIS — E43 Unspecified severe protein-calorie malnutrition: Secondary | ICD-10-CM | POA: Diagnosis not present

## 2022-05-01 DIAGNOSIS — Z3201 Encounter for pregnancy test, result positive: Secondary | ICD-10-CM | POA: Diagnosis not present

## 2022-05-01 DIAGNOSIS — J42 Unspecified chronic bronchitis: Secondary | ICD-10-CM | POA: Diagnosis present

## 2022-05-01 DIAGNOSIS — F1911 Other psychoactive substance abuse, in remission: Secondary | ICD-10-CM | POA: Diagnosis not present

## 2022-05-01 DIAGNOSIS — E860 Dehydration: Secondary | ICD-10-CM | POA: Diagnosis present

## 2022-05-01 DIAGNOSIS — Z91018 Allergy to other foods: Secondary | ICD-10-CM

## 2022-05-01 DIAGNOSIS — R945 Abnormal results of liver function studies: Secondary | ICD-10-CM | POA: Diagnosis not present

## 2022-05-01 DIAGNOSIS — R262 Difficulty in walking, not elsewhere classified: Secondary | ICD-10-CM | POA: Diagnosis present

## 2022-05-01 DIAGNOSIS — R600 Localized edema: Secondary | ICD-10-CM | POA: Diagnosis present

## 2022-05-01 DIAGNOSIS — R634 Abnormal weight loss: Secondary | ICD-10-CM | POA: Diagnosis not present

## 2022-05-01 DIAGNOSIS — M25569 Pain in unspecified knee: Secondary | ICD-10-CM | POA: Diagnosis present

## 2022-05-01 DIAGNOSIS — E876 Hypokalemia: Secondary | ICD-10-CM | POA: Diagnosis present

## 2022-05-01 DIAGNOSIS — Z87898 Personal history of other specified conditions: Secondary | ICD-10-CM

## 2022-05-01 DIAGNOSIS — F419 Anxiety disorder, unspecified: Secondary | ICD-10-CM | POA: Diagnosis not present

## 2022-05-01 DIAGNOSIS — R627 Adult failure to thrive: Secondary | ICD-10-CM | POA: Diagnosis present

## 2022-05-01 DIAGNOSIS — F319 Bipolar disorder, unspecified: Secondary | ICD-10-CM | POA: Diagnosis not present

## 2022-05-01 DIAGNOSIS — B3781 Candidal esophagitis: Secondary | ICD-10-CM | POA: Diagnosis not present

## 2022-05-01 DIAGNOSIS — R7989 Other specified abnormal findings of blood chemistry: Secondary | ICD-10-CM | POA: Diagnosis not present

## 2022-05-01 DIAGNOSIS — E861 Hypovolemia: Secondary | ICD-10-CM | POA: Diagnosis present

## 2022-05-01 DIAGNOSIS — R001 Bradycardia, unspecified: Secondary | ICD-10-CM | POA: Diagnosis not present

## 2022-05-01 DIAGNOSIS — E54 Ascorbic acid deficiency: Secondary | ICD-10-CM | POA: Diagnosis present

## 2022-05-01 DIAGNOSIS — E872 Acidosis, unspecified: Secondary | ICD-10-CM | POA: Diagnosis not present

## 2022-05-01 DIAGNOSIS — Z833 Family history of diabetes mellitus: Secondary | ICD-10-CM

## 2022-05-01 DIAGNOSIS — Z8041 Family history of malignant neoplasm of ovary: Secondary | ICD-10-CM

## 2022-05-01 DIAGNOSIS — N19 Unspecified kidney failure: Secondary | ICD-10-CM

## 2022-05-01 DIAGNOSIS — R7401 Elevation of levels of liver transaminase levels: Secondary | ICD-10-CM | POA: Diagnosis present

## 2022-05-01 DIAGNOSIS — K229 Disease of esophagus, unspecified: Secondary | ICD-10-CM | POA: Diagnosis not present

## 2022-05-01 DIAGNOSIS — Z8711 Personal history of peptic ulcer disease: Secondary | ICD-10-CM

## 2022-05-01 DIAGNOSIS — F411 Generalized anxiety disorder: Secondary | ICD-10-CM | POA: Diagnosis present

## 2022-05-01 DIAGNOSIS — Z66 Do not resuscitate: Secondary | ICD-10-CM | POA: Diagnosis present

## 2022-05-01 DIAGNOSIS — F1010 Alcohol abuse, uncomplicated: Secondary | ICD-10-CM | POA: Diagnosis present

## 2022-05-01 DIAGNOSIS — Z79899 Other long term (current) drug therapy: Secondary | ICD-10-CM

## 2022-05-01 DIAGNOSIS — K219 Gastro-esophageal reflux disease without esophagitis: Secondary | ICD-10-CM | POA: Diagnosis present

## 2022-05-01 DIAGNOSIS — D638 Anemia in other chronic diseases classified elsewhere: Secondary | ICD-10-CM | POA: Diagnosis not present

## 2022-05-01 DIAGNOSIS — Z681 Body mass index (BMI) 19 or less, adult: Secondary | ICD-10-CM

## 2022-05-01 DIAGNOSIS — G629 Polyneuropathy, unspecified: Secondary | ICD-10-CM | POA: Diagnosis present

## 2022-05-01 DIAGNOSIS — Z883 Allergy status to other anti-infective agents status: Secondary | ICD-10-CM

## 2022-05-01 DIAGNOSIS — Z638 Other specified problems related to primary support group: Secondary | ICD-10-CM

## 2022-05-01 DIAGNOSIS — G40409 Other generalized epilepsy and epileptic syndromes, not intractable, without status epilepticus: Secondary | ICD-10-CM | POA: Diagnosis present

## 2022-05-01 DIAGNOSIS — R571 Hypovolemic shock: Principal | ICD-10-CM | POA: Diagnosis present

## 2022-05-01 DIAGNOSIS — K449 Diaphragmatic hernia without obstruction or gangrene: Secondary | ICD-10-CM | POA: Diagnosis not present

## 2022-05-01 DIAGNOSIS — A419 Sepsis, unspecified organism: Secondary | ICD-10-CM | POA: Diagnosis not present

## 2022-05-01 DIAGNOSIS — E878 Other disorders of electrolyte and fluid balance, not elsewhere classified: Secondary | ICD-10-CM

## 2022-05-01 DIAGNOSIS — M549 Dorsalgia, unspecified: Secondary | ICD-10-CM | POA: Diagnosis present

## 2022-05-01 DIAGNOSIS — E8729 Other acidosis: Secondary | ICD-10-CM | POA: Diagnosis not present

## 2022-05-01 DIAGNOSIS — Z87440 Personal history of urinary (tract) infections: Secondary | ICD-10-CM

## 2022-05-01 DIAGNOSIS — L568 Other specified acute skin changes due to ultraviolet radiation: Secondary | ICD-10-CM | POA: Diagnosis not present

## 2022-05-01 DIAGNOSIS — I9589 Other hypotension: Secondary | ICD-10-CM | POA: Diagnosis not present

## 2022-05-01 DIAGNOSIS — Z538 Procedure and treatment not carried out for other reasons: Secondary | ICD-10-CM | POA: Diagnosis present

## 2022-05-01 DIAGNOSIS — R112 Nausea with vomiting, unspecified: Secondary | ICD-10-CM

## 2022-05-01 DIAGNOSIS — Z885 Allergy status to narcotic agent status: Secondary | ICD-10-CM

## 2022-05-01 DIAGNOSIS — K2289 Other specified disease of esophagus: Secondary | ICD-10-CM | POA: Diagnosis not present

## 2022-05-01 DIAGNOSIS — K828 Other specified diseases of gallbladder: Secondary | ICD-10-CM | POA: Diagnosis not present

## 2022-05-01 DIAGNOSIS — R1011 Right upper quadrant pain: Secondary | ICD-10-CM | POA: Diagnosis not present

## 2022-05-01 DIAGNOSIS — Z635 Disruption of family by separation and divorce: Secondary | ICD-10-CM

## 2022-05-01 DIAGNOSIS — R579 Shock, unspecified: Secondary | ICD-10-CM | POA: Diagnosis not present

## 2022-05-01 DIAGNOSIS — E778 Other disorders of glycoprotein metabolism: Secondary | ICD-10-CM | POA: Diagnosis present

## 2022-05-01 DIAGNOSIS — R64 Cachexia: Secondary | ICD-10-CM | POA: Diagnosis not present

## 2022-05-01 DIAGNOSIS — F17210 Nicotine dependence, cigarettes, uncomplicated: Secondary | ICD-10-CM | POA: Diagnosis present

## 2022-05-01 DIAGNOSIS — R931 Abnormal findings on diagnostic imaging of heart and coronary circulation: Secondary | ICD-10-CM | POA: Diagnosis not present

## 2022-05-01 DIAGNOSIS — Z881 Allergy status to other antibiotic agents status: Secondary | ICD-10-CM

## 2022-05-01 DIAGNOSIS — F4310 Post-traumatic stress disorder, unspecified: Secondary | ICD-10-CM | POA: Diagnosis present

## 2022-05-01 DIAGNOSIS — Z98891 History of uterine scar from previous surgery: Secondary | ICD-10-CM

## 2022-05-01 DIAGNOSIS — D520 Dietary folate deficiency anemia: Secondary | ICD-10-CM | POA: Diagnosis present

## 2022-05-01 DIAGNOSIS — E509 Vitamin A deficiency, unspecified: Secondary | ICD-10-CM | POA: Diagnosis present

## 2022-05-01 DIAGNOSIS — K2980 Duodenitis without bleeding: Secondary | ICD-10-CM | POA: Diagnosis present

## 2022-05-01 DIAGNOSIS — Z8249 Family history of ischemic heart disease and other diseases of the circulatory system: Secondary | ICD-10-CM

## 2022-05-01 DIAGNOSIS — Z886 Allergy status to analgesic agent status: Secondary | ICD-10-CM

## 2022-05-01 DIAGNOSIS — M7989 Other specified soft tissue disorders: Secondary | ICD-10-CM | POA: Diagnosis not present

## 2022-05-01 DIAGNOSIS — R609 Edema, unspecified: Secondary | ICD-10-CM | POA: Diagnosis not present

## 2022-05-01 DIAGNOSIS — K319 Disease of stomach and duodenum, unspecified: Secondary | ICD-10-CM | POA: Diagnosis present

## 2022-05-01 DIAGNOSIS — D649 Anemia, unspecified: Secondary | ICD-10-CM | POA: Diagnosis not present

## 2022-05-01 DIAGNOSIS — Z880 Allergy status to penicillin: Secondary | ICD-10-CM

## 2022-05-01 DIAGNOSIS — E8809 Other disorders of plasma-protein metabolism, not elsewhere classified: Secondary | ICD-10-CM | POA: Diagnosis present

## 2022-05-01 DIAGNOSIS — Z9151 Personal history of suicidal behavior: Secondary | ICD-10-CM

## 2022-05-01 DIAGNOSIS — M7070 Other bursitis of hip, unspecified hip: Secondary | ICD-10-CM | POA: Diagnosis present

## 2022-05-01 DIAGNOSIS — R188 Other ascites: Secondary | ICD-10-CM | POA: Diagnosis not present

## 2022-05-01 LAB — CBC
HCT: 26 % — ABNORMAL LOW (ref 36.0–46.0)
Hemoglobin: 9.4 g/dL — ABNORMAL LOW (ref 12.0–15.0)
MCH: 33 pg (ref 26.0–34.0)
MCHC: 36.2 g/dL — ABNORMAL HIGH (ref 30.0–36.0)
MCV: 91.2 fL (ref 80.0–100.0)
Platelets: 380 10*3/uL (ref 150–400)
RBC: 2.85 MIL/uL — ABNORMAL LOW (ref 3.87–5.11)
RDW: 15.1 % (ref 11.5–15.5)
WBC: 18.2 10*3/uL — ABNORMAL HIGH (ref 4.0–10.5)
nRBC: 0 % (ref 0.0–0.2)

## 2022-05-01 LAB — I-STAT CHEM 8, ED
BUN: >130 mg/dL — ABNORMAL HIGH (ref 6–20)
Calcium, Ion: 0.86 mmol/L — CL (ref 1.15–1.40)
Chloride: 90 mmol/L — ABNORMAL LOW (ref 98–111)
Creatinine, Ser: 5.7 mg/dL — ABNORMAL HIGH (ref 0.44–1.00)
Glucose, Bld: 126 mg/dL — ABNORMAL HIGH (ref 70–99)
HCT: 32 % — ABNORMAL LOW (ref 36.0–46.0)
Hemoglobin: 10.9 g/dL — ABNORMAL LOW (ref 12.0–15.0)
Potassium: 2.2 mmol/L — CL (ref 3.5–5.1)
Sodium: 121 mmol/L — ABNORMAL LOW (ref 135–145)
TCO2: 17 mmol/L — ABNORMAL LOW (ref 22–32)

## 2022-05-01 LAB — RAPID URINE DRUG SCREEN, HOSP PERFORMED
Amphetamines: NOT DETECTED
Barbiturates: NOT DETECTED
Benzodiazepines: POSITIVE — AB
Cocaine: NOT DETECTED
Opiates: NOT DETECTED
Tetrahydrocannabinol: NOT DETECTED

## 2022-05-01 LAB — GLUCOSE, CAPILLARY
Glucose-Capillary: 114 mg/dL — ABNORMAL HIGH (ref 70–99)
Glucose-Capillary: 136 mg/dL — ABNORMAL HIGH (ref 70–99)

## 2022-05-01 LAB — BASIC METABOLIC PANEL
Anion gap: 17 — ABNORMAL HIGH (ref 5–15)
BUN: 123 mg/dL — ABNORMAL HIGH (ref 6–20)
CO2: 12 mmol/L — ABNORMAL LOW (ref 22–32)
Calcium: 6.7 mg/dL — ABNORMAL LOW (ref 8.9–10.3)
Chloride: 96 mmol/L — ABNORMAL LOW (ref 98–111)
Creatinine, Ser: 3.99 mg/dL — ABNORMAL HIGH (ref 0.44–1.00)
GFR, Estimated: 14 mL/min — ABNORMAL LOW (ref >60)
Glucose, Bld: 125 mg/dL — ABNORMAL HIGH (ref 70–99)
Potassium: 2.6 mmol/L — CL (ref 3.5–5.1)
Sodium: 125 mmol/L — ABNORMAL LOW (ref 135–145)

## 2022-05-01 LAB — COMPREHENSIVE METABOLIC PANEL
ALT: 10 U/L (ref 0–44)
AST: 19 U/L (ref 15–41)
Albumin: 3.1 g/dL — ABNORMAL LOW (ref 3.5–5.0)
Alkaline Phosphatase: 83 U/L (ref 38–126)
Anion gap: 20 — ABNORMAL HIGH (ref 5–15)
BUN: 151 mg/dL — ABNORMAL HIGH (ref 6–20)
CO2: 17 mmol/L — ABNORMAL LOW (ref 22–32)
Calcium: 8.2 mg/dL — ABNORMAL LOW (ref 8.9–10.3)
Chloride: 87 mmol/L — ABNORMAL LOW (ref 98–111)
Creatinine, Ser: 5.16 mg/dL — ABNORMAL HIGH (ref 0.44–1.00)
GFR, Estimated: 10 mL/min — ABNORMAL LOW (ref >60)
Glucose, Bld: 129 mg/dL — ABNORMAL HIGH (ref 70–99)
Potassium: 2.3 mmol/L — CL (ref 3.5–5.1)
Sodium: 124 mmol/L — ABNORMAL LOW (ref 135–145)
Total Bilirubin: 0.7 mg/dL (ref 0.3–1.2)
Total Protein: 6.2 g/dL — ABNORMAL LOW (ref 6.5–8.1)

## 2022-05-01 LAB — CBC WITH DIFFERENTIAL/PLATELET
Abs Immature Granulocytes: 0.09 10*3/uL — ABNORMAL HIGH (ref 0.00–0.07)
Basophils Absolute: 0.1 10*3/uL (ref 0.0–0.1)
Basophils Relative: 0 %
Eosinophils Absolute: 0 10*3/uL (ref 0.0–0.5)
Eosinophils Relative: 0 %
HCT: 30 % — ABNORMAL LOW (ref 36.0–46.0)
Hemoglobin: 11.1 g/dL — ABNORMAL LOW (ref 12.0–15.0)
Immature Granulocytes: 1 %
Lymphocytes Relative: 11 %
Lymphs Abs: 1.6 10*3/uL (ref 0.7–4.0)
MCH: 32.9 pg (ref 26.0–34.0)
MCHC: 37 g/dL — ABNORMAL HIGH (ref 30.0–36.0)
MCV: 89 fL (ref 80.0–100.0)
Monocytes Absolute: 1.5 10*3/uL — ABNORMAL HIGH (ref 0.1–1.0)
Monocytes Relative: 11 %
Neutro Abs: 10.9 10*3/uL — ABNORMAL HIGH (ref 1.7–7.7)
Neutrophils Relative %: 77 %
Platelets: 484 10*3/uL — ABNORMAL HIGH (ref 150–400)
RBC: 3.37 MIL/uL — ABNORMAL LOW (ref 3.87–5.11)
RDW: 15.1 % (ref 11.5–15.5)
WBC: 14.1 10*3/uL — ABNORMAL HIGH (ref 4.0–10.5)
nRBC: 0 % (ref 0.0–0.2)

## 2022-05-01 LAB — ETHANOL: Alcohol, Ethyl (B): <10 mg/dL (ref <10)

## 2022-05-01 LAB — HIV ANTIBODY (ROUTINE TESTING W REFLEX): HIV Screen 4th Generation wRfx: NONREACTIVE

## 2022-05-01 LAB — URINALYSIS, ROUTINE W REFLEX MICROSCOPIC
Bilirubin Urine: NEGATIVE
Glucose, UA: NEGATIVE mg/dL
Ketones, ur: NEGATIVE mg/dL
Nitrite: NEGATIVE
Protein, ur: 30 mg/dL — AB
Specific Gravity, Urine: 1.011 (ref 1.005–1.030)
WBC, UA: >50 WBC/hpf — ABNORMAL HIGH (ref 0–5)
pH: 6 (ref 5.0–8.0)

## 2022-05-01 LAB — LACTIC ACID, PLASMA
Lactic Acid, Venous: 2.2 mmol/L (ref 0.5–1.9)
Lactic Acid, Venous: 2.5 mmol/L (ref 0.5–1.9)

## 2022-05-01 LAB — BETA-HYDROXYBUTYRIC ACID: Beta-Hydroxybutyric Acid: 1.26 mmol/L — ABNORMAL HIGH (ref 0.05–0.27)

## 2022-05-01 LAB — RESP PANEL BY RT-PCR (FLU A&B, COVID) ARPGX2
Influenza A by PCR: NEGATIVE
Influenza B by PCR: NEGATIVE
SARS Coronavirus 2 by RT PCR: NEGATIVE

## 2022-05-01 LAB — MAGNESIUM: Magnesium: 3.2 mg/dL — ABNORMAL HIGH (ref 1.7–2.4)

## 2022-05-01 LAB — PROCALCITONIN: Procalcitonin: <0.1 ng/mL

## 2022-05-01 LAB — TSH: TSH: 1.258 u[IU]/mL (ref 0.350–4.500)

## 2022-05-01 LAB — PROTIME-INR
INR: 1.2 (ref 0.8–1.2)
Prothrombin Time: 15.2 seconds (ref 11.4–15.2)

## 2022-05-01 LAB — I-STAT BETA HCG BLOOD, ED (MC, WL, AP ONLY): I-stat hCG, quantitative: 15.5 m[IU]/mL — ABNORMAL HIGH (ref <5)

## 2022-05-01 MED ORDER — SODIUM CHLORIDE 0.9 % IV BOLUS
1500.0000 mL | Freq: Once | INTRAVENOUS | Status: AC
  Administered 2022-05-01: 1500 mL via INTRAVENOUS

## 2022-05-01 MED ORDER — MAGNESIUM SULFATE 2 GM/50ML IV SOLN
2.0000 g | Freq: Once | INTRAVENOUS | Status: AC
  Administered 2022-05-01: 2 g via INTRAVENOUS
  Filled 2022-05-01: qty 50

## 2022-05-01 MED ORDER — LORAZEPAM 2 MG/ML IJ SOLN
0.5000 mg | Freq: Once | INTRAMUSCULAR | Status: AC
  Administered 2022-05-01: 0.5 mg via INTRAVENOUS
  Filled 2022-05-01: qty 1

## 2022-05-01 MED ORDER — LACTATED RINGERS IV BOLUS (SEPSIS): 1000.0000 mL | Freq: Once | INTRAVENOUS | Status: DC

## 2022-05-01 MED ORDER — SODIUM CHLORIDE 0.9 % IV SOLN
1.0000 g | INTRAVENOUS | Status: DC
  Administered 2022-05-02: 1 g via INTRAVENOUS
  Filled 2022-05-01 (×2): qty 10

## 2022-05-01 MED ORDER — POLYETHYLENE GLYCOL 3350 17 G PO PACK: 17.0000 g | PACK | Freq: Every day | ORAL | Status: DC | PRN

## 2022-05-01 MED ORDER — NOREPINEPHRINE 4 MG/250ML-% IV SOLN
0.0000 ug/min | INTRAVENOUS | Status: DC
  Administered 2022-05-01: 2 ug/min via INTRAVENOUS
  Filled 2022-05-01: qty 250

## 2022-05-01 MED ORDER — VANCOMYCIN VARIABLE DOSE PER UNSTABLE RENAL FUNCTION (PHARMACIST DOSING): Status: DC

## 2022-05-01 MED ORDER — DOCUSATE SODIUM 100 MG PO CAPS: 100.0000 mg | ORAL_CAPSULE | Freq: Two times a day (BID) | ORAL | Status: DC | PRN

## 2022-05-01 MED ORDER — SODIUM CHLORIDE 0.9 % IV SOLN
2.0000 g | Freq: Once | INTRAVENOUS | Status: AC
  Administered 2022-05-01: 2 g via INTRAVENOUS
  Filled 2022-05-01: qty 12.5

## 2022-05-01 MED ORDER — VANCOMYCIN HCL IN DEXTROSE 1-5 GM/200ML-% IV SOLN
1000.0000 mg | Freq: Once | INTRAVENOUS | Status: AC
  Administered 2022-05-01: 1000 mg via INTRAVENOUS
  Filled 2022-05-01: qty 200

## 2022-05-01 MED ORDER — SODIUM CHLORIDE 0.9 % IV SOLN
250.0000 mL | INTRAVENOUS | Status: DC
  Administered 2022-05-01 – 2022-05-05 (×6): 250 mL via INTRAVENOUS

## 2022-05-01 MED ORDER — NOREPINEPHRINE 4 MG/250ML-% IV SOLN
2.0000 ug/min | INTRAVENOUS | Status: DC
  Administered 2022-05-01: 8 ug/min via INTRAVENOUS
  Administered 2022-05-02: 10 ug/min via INTRAVENOUS
  Filled 2022-05-01: qty 250

## 2022-05-01 MED ORDER — POTASSIUM CHLORIDE 10 MEQ/100ML IV SOLN
10.0000 meq | INTRAVENOUS | Status: AC
  Administered 2022-05-01 (×3): 10 meq via INTRAVENOUS
  Filled 2022-05-01 (×3): qty 100

## 2022-05-01 MED ORDER — ONDANSETRON HCL 4 MG/2ML IJ SOLN
4.0000 mg | Freq: Four times a day (QID) | INTRAMUSCULAR | Status: DC | PRN
  Administered 2022-05-01 – 2022-05-03 (×4): 4 mg via INTRAVENOUS
  Filled 2022-05-01 (×4): qty 2

## 2022-05-01 MED ORDER — LACTATED RINGERS IV BOLUS (SEPSIS): 500.0000 mL | Freq: Once | INTRAVENOUS | Status: DC

## 2022-05-01 MED ORDER — LACTATED RINGERS IV BOLUS
2000.0000 mL | Freq: Once | INTRAVENOUS | Status: AC
  Administered 2022-05-01: 2000 mL via INTRAVENOUS

## 2022-05-01 MED ORDER — HEPARIN SODIUM (PORCINE) 5000 UNIT/ML IJ SOLN
5000.0000 [IU] | Freq: Three times a day (TID) | INTRAMUSCULAR | Status: DC
  Administered 2022-05-01 – 2022-05-14 (×38): 5000 [IU] via SUBCUTANEOUS
  Filled 2022-05-01 (×37): qty 1

## 2022-05-01 MED ORDER — SODIUM CHLORIDE 0.9 % IV BOLUS
500.0000 mL | Freq: Once | INTRAVENOUS | Status: AC
  Administered 2022-05-01: 500 mL via INTRAVENOUS

## 2022-05-01 MED ORDER — LACTATED RINGERS IV SOLN: INTRAVENOUS | Status: AC

## 2022-05-01 MED ORDER — POTASSIUM CHLORIDE 10 MEQ/100ML IV SOLN
10.0000 meq | INTRAVENOUS | Status: AC
  Administered 2022-05-01 – 2022-05-02 (×6): 10 meq via INTRAVENOUS
  Filled 2022-05-01 (×6): qty 100

## 2022-05-01 NOTE — ED Triage Notes (Signed)
Pt BIB friend, for feeling sick. Pt has a history of UTI and has been on antibiotics. Pt states she has not been eating for two months. Pt denies drug use.

## 2022-05-01 NOTE — ED Notes (Signed)
Critical care at bedside  

## 2022-05-01 NOTE — ED Notes (Signed)
Lab to add on TSH  

## 2022-05-01 NOTE — Progress Notes (Signed)
eLink Physician-Brief Progress Note Patient Name: Jamie Shields DOB: [birth year] MRN: [id number]   Date of Service  05/01/2022  HPI/Events of Note  Sepsis protocol - Initial Lactic Acid 2.5. No follow up Lactic Acids ordered.  eICU Interventions  Plan: Continue to trend Lactic Acid.      Intervention Category Major Interventions: Sepsis - evaluation and management  Kaedan Richert [place] 05/01/2022, 10:05 PM

## 2022-05-01 NOTE — ED Provider Notes (Signed)
[facility] EMERGENCY DEPARTMENT Provider Note   CSN: [id number] Arrival date & time: 05/01/22  1548     History  Chief Complaint  Patient presents with   Hypotension    Jamie Shields is a 41 y.o. female.  HPI  Per the patient, the patient is felt generally unwell over the past month.  Patient was recently treated for urinary tract infection has been taking ciprofloxacin versus UTI.  Patient endorses that she has not been able to keep anything down for weeks.  She notes that she has been extremely nauseous and been vomiting profusely.  She endorses that she had progressive weight loss and does not know how much weight she is lost.  She does not know how much she weighs.  Patient is felt generally weak and has had difficulty ambulating for the past month to have secondary to diffuse weakness.  Patient states that she hurts "all over".  She endorses that she has some mild back pain.  She denies any drug or alcohol use.  She states that she has not had any cough, congestion, sputum production, abdominal pain.     Home Medications Prior to Admission medications   Medication Sig Start Date End Date Taking? Authorizing Provider  beclomethasone (QVAR) 40 MCG/ACT inhaler Inhale 1 puff into the lungs 2 (two) times daily. Patient not taking: Reported on 05/01/2022    [provider]  busPIRone (BUSPAR) 10 MG tablet Take 10 mg by mouth 2 (two) times daily. Patient not taking: Reported on 05/01/2022 04/28/22   [provider]  busPIRone (BUSPAR) 5 MG tablet Take 5 mg by mouth 2 (two) times daily. Patient not taking: Reported on 05/01/2022 03/02/22   [provider]  ciprofloxacin (CIPRO) 250 MG tablet SMARTSIG:1 Tablet(s) By Mouth Every 12 Hours Patient not taking: Reported on 05/01/2022 04/23/22   [provider]  cyclobenzaprine (FLEXERIL) 10 MG tablet take 1 tablet twice daily Patient not taking: Reported on 05/01/2022 05/12/16   [provider]  diazepam (VALIUM) 5 MG tablet SMARTSIG:1-2 Tablet(s) By Mouth PRN Patient not taking: Reported on 05/01/2022 04/02/22   [provider]  diclofenac Sodium (VOLTAREN) 1 % GEL SMARTSIG:4 Gram(s) Topical 4 Times Daily PRN Patient not taking: Reported on 05/01/2022 04/23/22   [provider]  gabapentin (NEURONTIN) 100 MG capsule take 1 capsule three times daily Patient not taking: Reported on 05/01/2022 05/02/16   [provider]  gabapentin (NEURONTIN) 100 MG capsule Take by mouth. Patient not taking: Reported on 05/01/2022    [provider]  IBU 800 MG tablet Take 800 mg by mouth 3 (three) times daily as needed. Patient not taking: Reported on 05/01/2022 04/17/22   [provider]  ibuprofen (ADVIL,MOTRIN) 200 MG tablet Take 600 mg by mouth every 6 (six) hours as needed for mild pain. Patient not taking: Reported on 05/01/2022    [provider]  levETIRAcetam (KEPPRA) 500 MG tablet Take 500 mg by mouth 2 (two) times daily. Patient not taking: Reported on 05/01/2022 04/28/22   [provider]  nitrofurantoin, macrocrystal-monohydrate, (MACROBID) 100 MG capsule Take 100 mg by mouth 2 (two) times daily. Patient not taking: Reported on 05/01/2022 04/17/22   [provider]  omeprazole (PRILOSEC) 10 MG capsule Take by mouth. Patient not taking: Reported on 05/01/2022    [provider]  ondansetron (ZOFRAN-ODT) 4 MG disintegrating tablet Take 4 mg by mouth every 6 (six) hours as needed. Patient not taking: Reported on 05/01/2022  04/23/22   [provider]  PROVENTIL HFA 108 (90 Base) MCG/ACT inhaler Inhale 1 puff into the lungs every 4 (four) hours as needed. Shortness of breathe. Patient not taking: Reported on 05/01/2022 07/04/15   [provider]  traMADol (ULTRAM) 50 MG tablet Take 1 tablet (50 mg total) by mouth every 6 (six) hours as needed. Patient not taking: Reported on 05/01/2022 05/18/16    Okey Regal, PA-C  Vitamin D, Ergocalciferol, (DRISDOL) 1.25 MG (50000 UNIT) CAPS capsule Take 50,000 Units by mouth once a week. Patient not taking: Reported on 05/01/2022 04/17/22   [provider]      Allergies    Cucumber extract, Keflex [cephalexin], Macrobid [nitrofurantoin monohydrate macrocrystals], Naproxen, Penicillins, Tramadol, Watermelon concentrate, Darvocet [propoxyphene n-acetaminophen], Flagyl [metronidazole hcl], Ketorolac, Ultram [tramadol hcl], and Vicodin [hydrocodone-acetaminophen]    Review of Systems   Review of Systems  Physical Exam Updated Vital Signs BP (!) 92/55   Pulse 88   Temp (!) 97.2 F (36.2 C) (Axillary)   Resp 15   Ht 5\' 6"  (1.676 m)   Wt 44.5 kg   SpO2 100%   BMI 15.82 kg/m  Physical Exam Vitals and nursing note reviewed.  Constitutional:      General: She is not in acute distress.    Appearance: She is well-developed.     Comments: Frail, cachectic, chronically ill-appearing.  Sleepy but easily arousable  HENT:     Head: Normocephalic and atraumatic.     Mouth/Throat:     Mouth: Mucous membranes are dry.     Comments: Generally poor dentition Eyes:     Conjunctiva/sclera: Conjunctivae normal.  Cardiovascular:     Rate and Rhythm: Normal rate and regular rhythm.     Heart sounds: No murmur heard. Pulmonary:     Effort: Pulmonary effort is normal. No respiratory distress.     Breath sounds: Normal breath sounds.  Abdominal:     Palpations: Abdomen is soft.     Tenderness: There is no abdominal tenderness. There is right CVA tenderness and left CVA tenderness.  Musculoskeletal:        General: No swelling.     Cervical back: Neck supple.  Skin:    General: Skin is warm and dry.     Capillary Refill: Capillary refill takes more than 3 seconds.     Coloration: Skin is pale.  Neurological:     General: No focal deficit present.     Mental Status: She is alert.     Comments: Initially confused, improved with  rehydration  Psychiatric:        Mood and Affect: Mood normal.     ED Results / Procedures / Treatments   Labs (all labs ordered are listed, but only abnormal results are displayed) Labs Reviewed  LACTIC ACID, PLASMA - Abnormal; Notable for the following components:      Result Value   Lactic Acid, Venous 2.2 (*)    All other components within normal limits  LACTIC ACID, PLASMA - Abnormal; Notable for the following components:   Lactic Acid, Venous 2.5 (*)    All other components within normal limits  COMPREHENSIVE METABOLIC PANEL - Abnormal; Notable for the following components:   Sodium 124 (*)    Potassium 2.3 (*)    Chloride 87 (*)    CO2 17 (*)    Glucose, Bld 129 (*)    BUN 151 (*)    Creatinine, Ser 5.16 (*)    Calcium 8.2 (*)  Total Protein 6.2 (*)    Albumin 3.1 (*)    GFR, Estimated 10 (*)    Anion gap 20 (*)    All other components within normal limits  CBC WITH DIFFERENTIAL/PLATELET - Abnormal; Notable for the following components:   WBC 14.1 (*)    RBC 3.37 (*)    Hemoglobin 11.1 (*)    HCT 30.0 (*)    MCHC 37.0 (*)    Platelets 484 (*)    Neutro Abs 10.9 (*)    Monocytes Absolute 1.5 (*)    Abs Immature Granulocytes 0.09 (*)    All other components within normal limits  URINALYSIS, ROUTINE W REFLEX MICROSCOPIC - Abnormal; Notable for the following components:   APPearance CLOUDY (*)    Hgb urine dipstick LARGE (*)    Protein, ur 30 (*)    Leukocytes,Ua LARGE (*)    WBC, UA >50 (*)    Bacteria, UA MANY (*)    Non Squamous Epithelial 0-5 (*)    All other components within normal limits  RAPID URINE DRUG SCREEN, HOSP PERFORMED - Abnormal; Notable for the following components:   Benzodiazepines POSITIVE (*)    All other components within normal limits  MAGNESIUM - Abnormal; Notable for the following components:   Magnesium 3.2 (*)    All other components within normal limits  CBC - Abnormal; Notable for the following components:   WBC 18.2 (*)     RBC 2.85 (*)    Hemoglobin 9.4 (*)    HCT 26.0 (*)    MCHC 36.2 (*)    All other components within normal limits  BASIC METABOLIC PANEL - Abnormal; Notable for the following components:   Sodium 125 (*)    Potassium 2.6 (*)    Chloride 96 (*)    CO2 12 (*)    Glucose, Bld 125 (*)    BUN 123 (*)    Creatinine, Ser 3.99 (*)    Calcium 6.7 (*)    GFR, Estimated 14 (*)    Anion gap 17 (*)    All other components within normal limits  BETA-HYDROXYBUTYRIC ACID - Abnormal; Notable for the following components:   Beta-Hydroxybutyric Acid 1.26 (*)    All other components within normal limits  GLUCOSE, CAPILLARY - Abnormal; Notable for the following components:   Glucose-Capillary 136 (*)    All other components within normal limits  I-STAT BETA HCG BLOOD, ED ([facility], [facility], [facility] ONLY) - Abnormal; Notable for the following components:   I-stat hCG, quantitative 15.5 (*)    All other components within normal limits  I-STAT CHEM 8, ED - Abnormal; Notable for the following components:   Sodium 121 (*)    Potassium 2.2 (*)    Chloride 90 (*)    BUN >130 (*)    Creatinine, Ser 5.70 (*)    Glucose, Bld 126 (*)    Calcium, Ion 0.86 (*)    TCO2 17 (*)    Hemoglobin 10.9 (*)    HCT 32.0 (*)    All other components within normal limits  RESP PANEL BY RT-PCR (FLU A&B, COVID) ARPGX2  CULTURE, BLOOD (ROUTINE X 2)  CULTURE, BLOOD (ROUTINE X 2)  URINE CULTURE  URINE CULTURE  PROTIME-INR  ETHANOL  HIV ANTIBODY (ROUTINE TESTING W REFLEX)  PROCALCITONIN  TSH  BASIC METABOLIC PANEL  MAGNESIUM  CBC  PHOSPHORUS  PROCALCITONIN  HCG, QUANTITATIVE, PREGNANCY  LACTIC ACID, PLASMA  LACTIC ACID, PLASMA    EKG EKG Interpretation  Date/Time:  Friday May 01 2022 16:09:13 EDT Ventricular Rate:  75 PR Interval:  141 QRS Duration: 130 QT Interval:  471 QTC Calculation: 527 R Axis:   86 Text Interpretation: Sinus rhythm Atrial premature complex Nonspecific intraventricular conduction delay  Non-specific ST-t changes Confirmed by Cathren Laine ([id number]) on 05/01/2022 4:16:34 PM  Radiology DG Chest Port 1 View  Result Date: 05/01/2022 CLINICAL DATA:  Sepsis. EXAM: PORTABLE CHEST 1 VIEW COMPARISON:  October 01, 2017. FINDINGS: The heart size and mediastinal contours are within normal limits. Both lungs are clear. Hyperexpansion of the lungs is noted. The visualized skeletal structures are unremarkable. IMPRESSION: No acute cardiopulmonary abnormality seen. Hyperexpansion of the lungs. Electronically Signed   By: Lupita Raider M.D.   On: 05/01/2022 16:31    Procedures Procedures    Medications Ordered in ED Medications  lactated ringers infusion ( Intravenous New Bag/Given 05/01/22 1642)  docusate sodium (COLACE) capsule 100 mg (has no administration in time range)  polyethylene glycol (MIRALAX / GLYCOLAX) packet 17 g (has no administration in time range)  heparin injection 5,000 Units (5,000 Units Subcutaneous Given 05/01/22 2202)  ondansetron (ZOFRAN) injection 4 mg (has no administration in time range)  0.9 %  sodium chloride infusion (250 mLs Intravenous New Bag/Given 05/01/22 2158)  norepinephrine (LEVOPHED)  in (0.016 mg/mL) premix infusion (10 mcg/min Intravenous Rate/Dose Change 05/01/22 1902)  vancomycin variable dose per unstable renal function (pharmacist dosing) (has no administration in time range)  ceFEPIme (MAXIPIME) 1 g in sodium chloride 0.9 % 100 mL IVPB (has no administration in time range)  potassium chloride 10 mEq in 100 mL IVPB (10 mEq Intravenous New Bag/Given 05/01/22 2307)  ceFEPIme (MAXIPIME) 2 g in sodium chloride 0.9 % 100 mL IVPB (0 g Intravenous Stopped 05/01/22 1650)  vancomycin (VANCOCIN) IVPB 1000 mg/200 mL premix (0 mg Intravenous Stopped 05/01/22 1722)  sodium chloride 0.9 % bolus 1,500 mL (0 mLs Intravenous Stopped 05/01/22 1714)  potassium chloride 10 mEq in 100 mL IVPB (0 mEq Intravenous Stopped 05/01/22 2000)  magnesium sulfate IVPB 2 g 50 mL  (0 g Intravenous Stopped 05/01/22 1741)  LORazepam (ATIVAN) injection 0.5 mg (0.5 mg Intravenous Given 05/01/22 1634)  sodium chloride 0.9 % bolus 500 mL (0 mLs Intravenous Stopped 05/01/22 1745)  lactated ringers bolus 2,000 mL (0 mLs Intravenous Stopped 05/01/22 2100)    ED Course/ Medical Decision Making/ A&P                          Medical Decision Making Amount and/or Complexity of Data Reviewed Labs: ordered. Decision-making details documented in ED Course. Radiology: ordered. Decision-making details documented in ED Course. ECG/medicine tests: ordered. Decision-making details documented in ED Course.  Risk Prescription drug management. Decision regarding hospitalization.   Medical Decision Making  This patient is Presenting for Evaluation of generalized weakness, patient has a past medical history patient UTI which complicates their presentation.  Of which does require a range of treatment options, and is a complaint that involves a high risk of morbidity and mortality.  Arrived in ED by: POV  History obtained from: The patient  Limitations in history: generally poor historian    At this time I am most concerned for dehydration. Also considering metabolic abnormality, pyelonephritis, bacteremia, intoxication, intra-abdominal pathology. Plan for laboratory and imaging study work-up, fluid resuscitation   EKG: Evidence of significant electrolyte derangements with prolonged QTc 576, evidence of Q waves in the inferior lateral leads.  Abnormal PR interval.     Laboratory work-up significant for:  -Patient's laboratory work-up revealed mild leukocytosis with a WBC of 14.1 with left shift.  Hemoglobin low at 11.1, likely in the setting of anemia secondary to nutrition deficiency.  Patient's formal CMP revealed sodium of 124, potassium 2.3, marked AKI with creatinine of 5.16, BUN 151, CO2 17, anion gap of 20.  Anion gap could be multifactorial secondary to lactic acidosis as well as  uremia.  Lactic acid 2.2.   Interventions and Interval History: -On arrival to the emergency department, patient was hypotensive with systolic blood pressures ranging between 70s and 80s.  Maps hovering at approximately 55.  Patient was not tachycardic or febrile.  She is maintaining oxygen saturation on room air and was mentating appropriately.  Patient is feeling generally well.  2 large-bore IVs obtained patient was given 2 L normal saline fluid bolus.  Due to patient's recent UTI, patient be treated empirically with sepsis protocol with cefepime and vancomycin. -I-STAT revealed significant electrolyte derangements, sodium 121, potassium 2.2, significant elevation creatinine to 5.7 with elevated BUN to 130.  Ionized calcium also critically low at 0.86.  EKG also revealed evidence of electrolyte derangements, prolonged QTc to 576, evidence of U waves in the inferior lateral leads.  Following the results of the patient's i-STAT and EKG, patient's electrolytes repleted, patient given 3 rounds of IV potassium as well as treat empirically with IV magnesium.  Feel that these changes are likely secondary to that the patient is extremely poor nutrition over the past few weeks.  Patient's significant creatinine elevation is likely secondary to that to a prerenal AKI in the setting of significant dehydration -Bedside POCUS performed revealed appropriate EF, no evidence of pericardial effusion or significant wall motion abnormality.  IVC collapsible with respirations following 2 L fluid bolus, consistent without persistent dehydration and significant volume deficit -Patient was persistently hypotensive despite 2.5 L fluid bolus and maintenance fluids.  Patient was also given fluids via antibiotic therapy and electrolyte repletion.  Patient required eventual pressor support.  Patient was started on norepinephrine, was able to be titrated up to an appropriate blood pressure response.  MAP greater than 65 after  initiation of norepinephrine.  Patient's hypotension likely secondary to that of severe dehydration in setting of malnutrition and poor p.o. intake in the past few weeks.  Recent urinary tract infection may be contributory as patient does have mild flank pain she could have bacteremia secondary to pyelonephritis versus cystitis.  ICU team contacted after initiation of norepinephrine for admission.  Due to patient's low pressor requirement at this time, central access is not felt to be clinically indicated.  Will defer central line placement if patient's pressor requirement continues to increase.   Additional documents reviewed: OSH records    Disposition: Due to the patients current presenting symptoms, physical exam findings, and the workup stated above, it is thought that the etiology of the patients current presentation is hypotension 2/2 to severe dehydration and concern for uro sepsis   ADMIT: Patient is thought to require admission for hemodynamic monitoring. Patient will be admitted to ICU service. Please see in patient provider note for additional treatment plan details.    The plan for this patient was discussed with Dr. Denton Lank, who voiced agreement and who oversaw evaluation and treatment of this patient.     Clinical Complexity  A medically appropriate history, review of systems, and physical exam was performed.   I personally reviewed the lab and imaging  studies discussed above.   MDM generated using voice dictation software and may contain dictation errors. Please contact me for any clarification or with any questions.            Final Clinical Impression(s) / ED Diagnoses Final diagnoses:  Hypotension due to hypovolemia    Rx / DC Orders ED Discharge Orders     None         Myria Steenbergen, [place], MD 05/01/22 5056    Cathren Laine, MD 05/04/22 [phone]

## 2022-05-01 NOTE — Progress Notes (Addendum)
[facility]  Asked to see the patient to discuss risks and benefits of CT scan.  She states that she has been sexually active recently.  She is now feeling better after fluid administration.  On exam: abdomen is soft, mild tenderness without guarding in left lower quadrant  Will admit to ICU, continue supportive care with fluids, antibiotics and levophed Repeat B-HCG serum quantitave in AM Will order transvaginal U/S to look for uterine enlargement or evidence of ectopic pregnancy. Will notify pharmacy and review medication list for teratogenic agents If condition changes overnight then will reconsider CT scanning   Additional cc time coordinating care: 20 minutes  Roselie Awkward, MD [place] [facility] Pager: [phone] Cell: [phone] After 7:00 pm call Elink  [phone]

## 2022-05-01 NOTE — Care Plan (Signed)
RN called and stated that MD wanted to defer the CT until 05/02/22 at which time they will do another HCG and determine what to do at that time.

## 2022-05-01 NOTE — ED Notes (Signed)
1515mL Bolus completed, MD notified of pt's BP. Verbal order for 571mL bolus ordered.

## 2022-05-01 NOTE — Progress Notes (Addendum)
Pharmacy Antibiotic Note  Jamie Shields is a 41 y.o. female for which pharmacy has been consulted for cefepime and vancomycin dosing for sepsis.  Patient with a history of remote IVDA. Patient presenting with hypotension. Recent outpatient treatment for UTI with cipro and macrobid.  Cefepime 2g x 1 given in the ED.  SCr 5.7 WBC 14.1; LA 2.2; T 97.2; HR 85; RR 17 On levophed for hemodynamic support  Plan: Cefepime 1g q24hr Vancomycin 1000 mg once, subsequent dosing as indicated per random vancomycin level until renal function stable and/or improved, at which time scheduled dosing can be considered Trend WBC, Fever, Renal function F/u cultures, clinical course, WBC, fever De-escalate when able F/u Nephrology plan     Temp (24hrs), Avg:97.6 F (36.4 C), Min:97.6 F (36.4 C), Max:97.6 F (36.4 C)  No results for input(s): "WBC", "CREATININE", "LATICACIDVEN", "VANCOTROUGH", "VANCOPEAK", "VANCORANDOM", "GENTTROUGH", "GENTPEAK", "GENTRANDOM", "TOBRATROUGH", "TOBRAPEAK", "TOBRARND", "AMIKACINPEAK", "AMIKACINTROU", "AMIKACIN" in the last 168 hours.  CrCl cannot be calculated (Patient's most recent lab result is older than the maximum 21 days allowed.).    Allergies  Allergen Reactions   Cucumber Extract Anaphylaxis   Keflex [Cephalexin] Nausea And Vomiting   Macrobid [Nitrofurantoin Monohydrate Macrocrystals] Hives and Swelling   Naproxen Nausea And Vomiting   Penicillins Nausea And Vomiting    Heavy vomitting Did PCN reaction causing immediate rash, facial/tongue/throat swelling, SOB or lightheadedness with hypotension: No swelling, but im not sure if i got a rash as i was red all over already Did PCN reaction causing severe rash involving mucus membranes or skin necrosis: No Did a PCN reaction that required hospitalization Yes went to the ED Has patient had a PCN reaction occurring within the last 10 years: No-childhood allergy If all of the above answers are "NO", then may  proceed with   Watermelon Concentrate Diarrhea   Darvocet [Propoxyphene N-Acetaminophen] Nausea And Vomiting and Other (See Comments)    migraines   Flagyl [Metronidazole Hcl] Hives, Swelling and Rash   Ketorolac Nausea And Vomiting and Other (See Comments)    migraines   Ultram [Tramadol Hcl] Nausea And Vomiting and Other (See Comments)    migraines   Vicodin [Hydrocodone-Acetaminophen] Nausea And Vomiting and Other (See Comments)    migraines    Antimicrobials this admission: vancomycin 10/6 >>  cefepime 10/6 >>   Microbiology results: Pending  Thank you for allowing pharmacy to be a part of this patient's care.  Lorelei Pont, PharmD, BCPS 05/01/2022 4:10 PM ED Clinical Pharmacist -  [phone]

## 2022-05-01 NOTE — ED Notes (Signed)
IV team at bedside 

## 2022-05-01 NOTE — ED Notes (Addendum)
MD at bedside discussing CT with patient. Per MD plan is to recheck HCG in morning and defer CT until tomorrow AM

## 2022-05-01 NOTE — ED Notes (Signed)
MD Notified of critical potassium

## 2022-05-01 NOTE — H&P (Signed)
NAME:  Jamie Shields, MRN:  [id number], DOB:  [birth year], LOS: 0 ADMISSION DATE:  05/01/2022, CONSULTATION DATE:  10/6 REFERRING MD:  Cyndee Brightly, CHIEF COMPLAINT:  shock    History of Present Illness:  40 year old female w/ remote IVDA. Presents to ER 10/6 w/ cc: Severe weakness, nausea and vomiting.  Per the patient she has had ongoing nausea and vomiting for over 4 weeks.  Initially seen by primary care provider about 4 weeks prior at which time was diagnosed with a urinary tract infection and prescribed Cipro.  She has continued to have nausea and vomiting, reports the vomiting occurs almost immediately after eating.  She is essentially been at home over the last several weeks getting weaker and weaker, only able to keep down clear liquids and broth.  She is denied shortness of breath, chest pain, cough.  Has not had any abdominal pain, or diarrhea.  Presented to the emergency room at the insistence of her fianc.  On arrival she was found to be hypotensive with systolic blood pressure in the 80s, Had mild lactic acidosis, 2.2, initial sodium 124, potassium 2.3, with anion gap metabolic acidosis.  Initial white blood cell count was 14.1.  Cultures were sent, she was initiated on IV crystalloid, 30 mL/kg.  Following fluid volume resuscitation still had point of care ultrasound suggesting volume depletion and thus received more IV hydration.  IV antibiotics were initiated, she was started on norepinephrine infusion, and critical care asked to admit.  Pertinent  Medical History  Remote IV drug abuse.  Chronic bronchitis, history of seizure disorder, bipolar disease, anxiety. Significant Hospital Events: Including procedures, antibiotic start and stop dates in addition to other pertinent events   Admitted 10/6 with profound weakness, nausea, vomiting, hypotension.  Started on IV hydration, culture sent, empiric antibiotics initiated  Interim History / Subjective:  Feels weak and cold  Objective   Blood  pressure (Abnormal) 80/43, pulse 85, temperature (Abnormal) 97.2 F (36.2 C), temperature source Oral, resp. rate 17, height 5\' 6"  (1.676 m), weight 44.5 kg, SpO2 98 %.        Intake/Output Summary (Last 24 hours) at 05/01/2022 1840 Last data filed at 05/01/2022 1745 Gross per 24 hour  Intake 2440.92 ml  Output no documentation  Net 2440.92 ml   Filed Weights   05/01/22 1611  Weight: 44.5 kg    Examination: General: Cachectic 41 year old female resting in bed currently in no acute distress HENT: Normocephalic atraumatic temporal wasting appreciated she is in dentulous Lungs: Clear to auscultation Cardiovascular: Regular rate and rhythm Abdomen: Soft not tender Extremities: Warm dry Neuro: Awake oriented GU: Due to void  Resolved Hospital Problem list     Assessment & Plan:  Principal Problem:   Hypovolemic shock (HCC) Active Problems:   Severe dehydration   Nausea and vomiting   Disorders of fluid, electrolyte, and acid-base balance   High anion gap metabolic acidosis   Lactic acid acidosis   Hyponatremia   Hypokalemia   AKI (acute kidney injury) (HCC)   Uremia   Severe protein-calorie malnutrition (HCC)   Positive urine pregnancy test  Severe hypovolemia w/ associated hypovolemic shock, +/- sepsis although she does not appear toxic Plan Cont IVF (ordered 2 more liters) Empiric abx (vanc and cefepime) Pan-cultured; will follow PCT algo w/ low threshold to stop abx Cont aggressive hydration  Norepi for MAP > 65   Fluid and electrolyte imbalance, severe hyponatremia (think this is likely 2/2 hypovolemia), hypokalemia -has received ~ 3  liters to date  Plan Cont IVFs F/u chems ordered Urine Na, cr, osmo Replace K Will need freq checks  Nausea, vomiting and weight loss Has been going on for at least 4 weeks. I am concerned about some sort of esophogeal stricture or obstruction as she can only take in broths Plan CT (non-contrasted abd) PRN  antiemetics Will prob need Esophagram  Would also likely benefit from GI consult  AKI 2/2 all of above Plan Renal dose meds Cont IVFs Strict I&O Serial labs  Positive urine pregnancy  Plan Serum sent  Best Practice (right click and "Reselect all SmartList Selections" daily)   Diet/type: clear liquids DVT prophylaxis: prophylactic heparin  GI prophylaxis: PPI Lines: N/A Foley:  N/A Code Status:  full code Last date of multidisciplinary goals of care discussion [pending]  Labs   CBC: Recent Labs  Lab 05/01/22 1608 05/01/22 1614  WBC 14.1*  --   NEUTROABS 10.9*  --   HGB 11.1* 10.9*  HCT 30.0* 32.0*  MCV 89.0  --   PLT 484*  --     Basic Metabolic Panel: Recent Labs  Lab 05/01/22 1608 05/01/22 1614  NA 124* 121*  K 2.3* 2.2*  CL 87* 90*  CO2 17*  --   GLUCOSE 129* 126*  BUN 151* >130*  CREATININE 5.16* 5.70*  CALCIUM 8.2*  --   MG 3.2*  --    GFR: Estimated Creatinine Clearance: 9.2 mL/min (A) (by C-G formula based on SCr of 5.7 mg/dL (H)). Recent Labs  Lab 05/01/22 1608  WBC 14.1*  LATICACIDVEN 2.2*    Liver Function Tests: Recent Labs  Lab 05/01/22 1608  AST 19  ALT 10  ALKPHOS 83  BILITOT 0.7  PROT 6.2*  ALBUMIN 3.1*   No results for input(s): "LIPASE", "AMYLASE" in the last 168 hours. No results for input(s): "AMMONIA" in the last 168 hours.  ABG    Component Value Date/Time   TCO2 17 (L) 05/01/2022 1614     Coagulation Profile: Recent Labs  Lab 05/01/22 1608  INR 1.2    Cardiac Enzymes: No results for input(s): "CKTOTAL", "CKMB", "CKMBINDEX", "TROPONINI" in the last 168 hours.  HbA1C: No results found for: "HGBA1C"  CBG: No results for input(s): "GLUCAP" in the last 168 hours.  Review of Systems:   Review of Systems  Constitutional:  Positive for malaise/fatigue.  HENT: Negative.    Eyes: Negative.   Respiratory: Negative.    Cardiovascular: Negative.   Gastrointestinal:  Positive for nausea and vomiting.   Genitourinary: Negative.   Musculoskeletal: Negative.   Skin: Negative.   Neurological: Negative.   Endo/Heme/Allergies: Negative.      Past Medical History:  She,  has a past medical history of Anxiety, Bipolar 1 disorder (HCC), Bursitis of hip, Chronic bronchitis, Hip joint pain, and Seizures (HCC).   Surgical History:   Past Surgical History:  Procedure Laterality Date   CESAREAN SECTION     2002   KNEE ARTHROSCOPY     MOUTH SURGERY  2010     Social History:   reports that she has been smoking cigarettes. She has been smoking an average of .5 packs per day. She has never used smokeless tobacco. She reports current alcohol use. She reports that she does not use drugs.   Family History:  Her family history includes Cancer in an other family member; Diabetes in an other family member; Hypertension in her mother.   Allergies Allergies  Allergen Reactions   Cucumber Extract  Anaphylaxis   Keflex [Cephalexin] Nausea And Vomiting   Macrobid [Nitrofurantoin Monohydrate Macrocrystals] Hives and Swelling   Naproxen Nausea And Vomiting   Penicillins Nausea And Vomiting    Heavy vomitting Did PCN reaction causing immediate rash, facial/tongue/throat swelling, SOB or lightheadedness with hypotension: No swelling, but im not sure if i got a rash as i was red all over already Did PCN reaction causing severe rash involving mucus membranes or skin necrosis: No Did a PCN reaction that required hospitalization Yes went to the ED Has patient had a PCN reaction occurring within the last 10 years: No-childhood allergy If all of the above answers are "NO", then may proceed with   Watermelon Concentrate Diarrhea   Darvocet [Propoxyphene N-Acetaminophen] Nausea And Vomiting and Other (See Comments)    migraines   Flagyl [Metronidazole Hcl] Hives, Swelling and Rash   Ketorolac Nausea And Vomiting and Other (See Comments)    migraines   Ultram [Tramadol Hcl] Nausea And Vomiting and Other (See  Comments)    migraines   Vicodin [Hydrocodone-Acetaminophen] Nausea And Vomiting and Other (See Comments)    migraines     Home Medications  Prior to Admission medications   Medication Sig Start Date End Date Taking? Authorizing Provider  azithromycin (ZITHROMAX) 250 MG tablet Take 1 tablet (250 mg total) by mouth daily. Take 1 every day until finished. Patient not taking: Reported on 05/18/2016 08/08/15   Mesner, Corene Cornea, MD  beclomethasone (QVAR) 40 MCG/ACT inhaler Inhale 1 puff into the lungs 2 (two) times daily.    [provider]  cyclobenzaprine (FLEXERIL) 10 MG tablet take 1 tablet twice daily 05/12/16   [provider]  gabapentin (NEURONTIN) 100 MG capsule take 1 capsule three times daily 05/02/16   [provider]  ibuprofen (ADVIL,MOTRIN) 200 MG tablet Take 600 mg by mouth every 6 (six) hours as needed for mild pain.    [provider]  PROVENTIL HFA 108 (90 Base) MCG/ACT inhaler Inhale 1 puff into the lungs every 4 (four) hours as needed. Shortness of breathe. 07/04/15   [provider]  traMADol (ULTRAM) 50 MG tablet Take 1 tablet (50 mg total) by mouth every 6 (six) hours as needed. 05/18/16   Okey Regal, PA-C     Critical care time: 32 min    Erick Colace ACNP-BC [facility] Pager # [phone] OR # [phone] if no answer

## 2022-05-01 NOTE — ED Notes (Addendum)
This RN asked if urine preg needed to be added on since pt was able to provide sample. Per MD McQuaid serum test is fine at this time. No additional orders.

## 2022-05-01 NOTE — ED Notes (Signed)
Pt is at max dose of Levophed with no improvement to BP. LR boluses infusing. Hunsucker, MD aware of pt's ongoing BP.

## 2022-05-02 ENCOUNTER — Inpatient Hospital Stay (HOSPITAL_COMMUNITY): Payer: Medicaid Other

## 2022-05-02 DIAGNOSIS — R571 Hypovolemic shock: Secondary | ICD-10-CM | POA: Diagnosis not present

## 2022-05-02 LAB — PROCALCITONIN: Procalcitonin: <0.1 ng/mL

## 2022-05-02 LAB — CBC
HCT: 26 % — ABNORMAL LOW (ref 36.0–46.0)
Hemoglobin: 9.7 g/dL — ABNORMAL LOW (ref 12.0–15.0)
MCH: 33 pg (ref 26.0–34.0)
MCHC: 37.3 g/dL — ABNORMAL HIGH (ref 30.0–36.0)
MCV: 88.4 fL (ref 80.0–100.0)
Platelets: 410 10*3/uL — ABNORMAL HIGH (ref 150–400)
RBC: 2.94 MIL/uL — ABNORMAL LOW (ref 3.87–5.11)
RDW: 15.2 % (ref 11.5–15.5)
WBC: 11.4 10*3/uL — ABNORMAL HIGH (ref 4.0–10.5)
nRBC: 0 % (ref 0.0–0.2)

## 2022-05-02 LAB — BASIC METABOLIC PANEL
Anion gap: 11 (ref 5–15)
Anion gap: 13 (ref 5–15)
Anion gap: 11 (ref 5–15)
BUN: 92 mg/dL — ABNORMAL HIGH (ref 6–20)
BUN: 86 mg/dL — ABNORMAL HIGH (ref 6–20)
BUN: 67 mg/dL — ABNORMAL HIGH (ref 6–20)
CO2: 14 mmol/L — ABNORMAL LOW (ref 22–32)
CO2: 15 mmol/L — ABNORMAL LOW (ref 22–32)
CO2: 14 mmol/L — ABNORMAL LOW (ref 22–32)
Calcium: 6.8 mg/dL — ABNORMAL LOW (ref 8.9–10.3)
Calcium: 6.8 mg/dL — ABNORMAL LOW (ref 8.9–10.3)
Calcium: 7.3 mg/dL — ABNORMAL LOW (ref 8.9–10.3)
Chloride: 103 mmol/L (ref 98–111)
Chloride: 101 mmol/L (ref 98–111)
Chloride: 103 mmol/L (ref 98–111)
Creatinine, Ser: 2.93 mg/dL — ABNORMAL HIGH (ref 0.44–1.00)
Creatinine, Ser: 2.75 mg/dL — ABNORMAL HIGH (ref 0.44–1.00)
Creatinine, Ser: 2.23 mg/dL — ABNORMAL HIGH (ref 0.44–1.00)
GFR, Estimated: 20 mL/min — ABNORMAL LOW (ref >60)
GFR, Estimated: 22 mL/min — ABNORMAL LOW (ref >60)
GFR, Estimated: 28 mL/min — ABNORMAL LOW (ref >60)
Glucose, Bld: 112 mg/dL — ABNORMAL HIGH (ref 70–99)
Glucose, Bld: 118 mg/dL — ABNORMAL HIGH (ref 70–99)
Glucose, Bld: 116 mg/dL — ABNORMAL HIGH (ref 70–99)
Potassium: 2.6 mmol/L — CL (ref 3.5–5.1)
Potassium: 2.4 mmol/L — CL (ref 3.5–5.1)
Potassium: 3.3 mmol/L — ABNORMAL LOW (ref 3.5–5.1)
Sodium: 128 mmol/L — ABNORMAL LOW (ref 135–145)
Sodium: 129 mmol/L — ABNORMAL LOW (ref 135–145)
Sodium: 128 mmol/L — ABNORMAL LOW (ref 135–145)

## 2022-05-02 LAB — GLUCOSE, CAPILLARY
Glucose-Capillary: 94 mg/dL (ref 70–99)
Glucose-Capillary: 132 mg/dL — ABNORMAL HIGH (ref 70–99)
Glucose-Capillary: 90 mg/dL (ref 70–99)
Glucose-Capillary: 101 mg/dL — ABNORMAL HIGH (ref 70–99)
Glucose-Capillary: 100 mg/dL — ABNORMAL HIGH (ref 70–99)

## 2022-05-02 LAB — LACTIC ACID, PLASMA
Lactic Acid, Venous: 2.6 mmol/L (ref 0.5–1.9)
Lactic Acid, Venous: 2.1 mmol/L (ref 0.5–1.9)

## 2022-05-02 LAB — URINE CULTURE: Culture: NO GROWTH

## 2022-05-02 LAB — HCG, QUANTITATIVE, PREGNANCY: hCG, Beta Chain, Quant, S: <1 m[IU]/mL (ref <5)

## 2022-05-02 LAB — VANCOMYCIN, RANDOM: Vancomycin Rm: 13 ug/mL

## 2022-05-02 LAB — MAGNESIUM: Magnesium: 2.6 mg/dL — ABNORMAL HIGH (ref 1.7–2.4)

## 2022-05-02 LAB — PHOSPHORUS: Phosphorus: 1.9 mg/dL — ABNORMAL LOW (ref 2.5–4.6)

## 2022-05-02 MED ORDER — SODIUM CHLORIDE 0.9 % IV BOLUS
1000.0000 mL | Freq: Once | INTRAVENOUS | Status: AC
  Administered 2022-05-02: 1000 mL via INTRAVENOUS

## 2022-05-02 MED ORDER — SODIUM CHLORIDE 0.9 % IV SOLN
6.2500 mg | Freq: Once | INTRAVENOUS | Status: AC
  Administered 2022-05-02: 6.25 mg via INTRAVENOUS
  Filled 2022-05-02: qty 0.25

## 2022-05-02 MED ORDER — MELATONIN 3 MG PO TABS
3.0000 mg | ORAL_TABLET | Freq: Every evening | ORAL | Status: DC | PRN
  Administered 2022-05-02 – 2022-05-10 (×3): 3 mg via ORAL
  Filled 2022-05-02 (×4): qty 1

## 2022-05-02 MED ORDER — CHLORHEXIDINE GLUCONATE CLOTH 2 % EX PADS
6.0000 | MEDICATED_PAD | Freq: Every day | CUTANEOUS | Status: DC
  Administered 2022-05-02 – 2022-05-06 (×5): 6 via TOPICAL

## 2022-05-02 MED ORDER — ALBUMIN HUMAN 25 % IV SOLN
25.0000 g | Freq: Once | INTRAVENOUS | Status: AC
  Administered 2022-05-02: 25 g via INTRAVENOUS
  Filled 2022-05-02: qty 100

## 2022-05-02 MED ORDER — LACTATED RINGERS IV BOLUS
1000.0000 mL | Freq: Once | INTRAVENOUS | Status: AC
  Administered 2022-05-02: 1000 mL via INTRAVENOUS

## 2022-05-02 MED ORDER — POTASSIUM CHLORIDE 10 MEQ/50ML IV SOLN
10.0000 meq | INTRAVENOUS | Status: AC
  Administered 2022-05-02 (×6): 10 meq via INTRAVENOUS
  Filled 2022-05-02 (×6): qty 50

## 2022-05-02 MED ORDER — FAMOTIDINE IN NACL 20-0.9 MG/50ML-% IV SOLN
20.0000 mg | INTRAVENOUS | Status: DC
  Administered 2022-05-02 – 2022-05-04 (×3): 20 mg via INTRAVENOUS
  Filled 2022-05-02 (×3): qty 50

## 2022-05-02 MED ORDER — VANCOMYCIN HCL 750 MG/150ML IV SOLN
750.0000 mg | Freq: Once | INTRAVENOUS | Status: AC
  Administered 2022-05-02: 750 mg via INTRAVENOUS
  Filled 2022-05-02: qty 150

## 2022-05-02 MED ORDER — LEVETIRACETAM IN NACL 500 MG/100ML IV SOLN
500.0000 mg | Freq: Two times a day (BID) | INTRAVENOUS | Status: DC
  Administered 2022-05-03 – 2022-05-04 (×3): 500 mg via INTRAVENOUS
  Filled 2022-05-02 (×3): qty 100

## 2022-05-02 MED ORDER — NOREPINEPHRINE 4 MG/250ML-% IV SOLN
0.0000 ug/min | INTRAVENOUS | Status: DC
  Administered 2022-05-02: 13 ug/min via INTRAVENOUS
  Administered 2022-05-02 (×2): 14 ug/min via INTRAVENOUS
  Administered 2022-05-02: 10 ug/min via INTRAVENOUS
  Administered 2022-05-02 – 2022-05-03 (×2): 14 ug/min via INTRAVENOUS
  Administered 2022-05-03: 18 ug/min via INTRAVENOUS
  Filled 2022-05-02 (×6): qty 250

## 2022-05-02 MED ORDER — LEVETIRACETAM IN NACL 1000 MG/100ML IV SOLN
1000.0000 mg | Freq: Once | INTRAVENOUS | Status: AC
  Administered 2022-05-03: 1000 mg via INTRAVENOUS

## 2022-05-02 MED ORDER — POTASSIUM PHOSPHATES 15 MMOLE/5ML IV SOLN
15.0000 mmol | Freq: Once | INTRAVENOUS | Status: AC
  Administered 2022-05-02: 15 mmol via INTRAVENOUS
  Filled 2022-05-02: qty 5

## 2022-05-02 MED ORDER — POTASSIUM CHLORIDE 10 MEQ/50ML IV SOLN
10.0000 meq | INTRAVENOUS | Status: AC
  Administered 2022-05-02 (×4): 10 meq via INTRAVENOUS
  Filled 2022-05-02 (×4): qty 50

## 2022-05-02 NOTE — Progress Notes (Signed)
Pharmacy Electrolyte Replacement  Recent Labs:  Recent Labs    05/02/22 0406 05/02/22 0604  K 2.6* 2.4*  MG 2.6*  --   PHOS 1.9*  --   CREATININE 2.93* 2.75*    K = 2.4 and Phos = 1.9  Plan:  Kcl 10 meq x 6 Kphos 15 mmol IV x 1   Alanda Slim, PharmD, [place] Clinical Pharmacist Please see AMION for all Pharmacists' Contact Phone Numbers 05/02/2022, 7:37 AM

## 2022-05-02 NOTE — Progress Notes (Signed)
eLink Physician-Brief Progress Note Patient Name: ISABELLAH SOBOCINSKI DOB: 1981/07/21 MRN: [id number]   Date of Service  05/02/2022  HPI/Events of Note  Patient request for sleep aid.   eICU Interventions  Plan: Melatonin 3 mg PO Q HS PRN sleep.     Intervention Category Major Interventions: Other:  Lysle Dingwall 05/02/2022, 10:35 PM

## 2022-05-02 NOTE — Progress Notes (Signed)
Pharmacy Antibiotic Note  Jamie Shields is a 41 y.o. female for which pharmacy has been consulted for = vancomycin dosing for sepsis.  Patient with a history of remote IVDA. Patient presenting with hypotension. Recent outpatient treatment for UTI with cipro and macrobid.  SCr 5.7>>2.75>>2.23 WBC 14.1>>11.4; LA 2.2; T 97.2; HR 85; RR 17 On levophed for hemodynamic support  Vancomycin random level at ~24 hours is 13 mcg/ml  Plan: Continue Cefepime 1g q24hr Vancomycin 750 mg once, subsequent dosing as indicated per random vancomycin level until renal function stable and/or improved, at which time scheduled dosing can be considered Trend WBC, Fever, Renal function F/u cultures, clinical course, WBC, fever De-escalate when able F/u Nephrology plan  Height: 5\' 6"  (167.6 cm) Weight: 44.7 kg (98 lb 8.7 oz) IBW/kg (Calculated) : 59.3  Temp (24hrs), Avg:97.9 F (36.6 C), Min:97.2 F (36.2 C), Max:99.3 F (37.4 C)  Recent Labs  Lab 05/01/22 1608 05/01/22 1614 05/01/22 1853 05/01/22 2301 05/02/22 0406 05/02/22 0604 05/02/22 1627  WBC 14.1*  --  18.2*  --   --  11.4*  --   CREATININE 5.16* 5.70* 3.99*  --  2.93* 2.75* 2.23*  LATICACIDVEN 2.2*  --  2.5* 2.6* 2.1*  --   --   VANCORANDOM  --   --   --   --   --   --  13    Estimated Creatinine Clearance: 23.7 mL/min (A) (by C-G formula based on SCr of 2.23 mg/dL (H)).    Allergies  Allergen Reactions   Cucumber Extract Anaphylaxis   Keflex [Cephalexin] Nausea And Vomiting   Macrobid [Nitrofurantoin Monohydrate Macrocrystals] Hives and Swelling   Naproxen Nausea And Vomiting   Penicillins Nausea And Vomiting    Heavy vomitting Did PCN reaction causing immediate rash, facial/tongue/throat swelling, SOB or lightheadedness with hypotension: No swelling, but im not sure if i got a rash as i was red all over already Did PCN reaction causing severe rash involving mucus membranes or skin necrosis: No Did a PCN reaction that required  hospitalization Yes went to the ED Has patient had a PCN reaction occurring within the last 10 years: No-childhood allergy If all of the above answers are "NO", then may proceed with   Tramadol Nausea And Vomiting   Watermelon Concentrate Diarrhea   Darvocet [Propoxyphene N-Acetaminophen] Nausea And Vomiting and Other (See Comments)    migraines   Flagyl [Metronidazole Hcl] Hives, Swelling and Rash   Ketorolac Nausea And Vomiting and Other (See Comments)    migraines   Ultram [Tramadol Hcl] Nausea And Vomiting and Other (See Comments)    migraines   Vicodin [Hydrocodone-Acetaminophen] Nausea And Vomiting and Other (See Comments)    migraines    Antimicrobials this admission: vancomycin 10/6 >>  cefepime 10/6 >>   Microbiology results: Pending  Jamie Shields, PharmD, BCPS, FNKF Clinical Pharmacist [place] Please utilize Amion for appropriate phone number to reach the unit pharmacist ([place])

## 2022-05-02 NOTE — Progress Notes (Signed)
eLink Physician-Brief Progress Note Patient Name: Jamie Shields DOB: 22-Sep-1980 MRN: [id number]   Date of Service  05/02/2022  HPI/Events of Note  Patient c/o burning in throat/heartburn. Concern on admission for esophageal obstruction. Will avoid PO therapy. Will use H2 Blocker as onset of action will be faster than PPI.  eICU Interventions  Plan: Pepcid 20 mg IV now and BID.     Intervention Category Major Interventions: Other:  Lysle Dingwall 05/02/2022, 12:36 AM

## 2022-05-02 NOTE — H&P (Signed)
NAME:  Jamie Shields, MRN:  [id number], DOB:  Jul 01, 1981, LOS: 1 ADMISSION DATE:  05/01/2022, CONSULTATION DATE:  10/6 REFERRING MD:  Cyndee Brightly, CHIEF COMPLAINT:  shock    History of Present Illness:  41 year old female w/ remote IVDA. Presents to ER 10/6 w/ cc: Severe weakness, nausea and vomiting.  Per the patient she has had ongoing nausea and vomiting for over 4 weeks.  Initially seen by primary care provider about 4 weeks prior at which time was diagnosed with a urinary tract infection and prescribed Cipro.  She has continued to have nausea and vomiting, reports the vomiting occurs almost immediately after eating.  She is essentially been at home over the last several weeks getting weaker and weaker, only able to keep down clear liquids and broth.  She is denied shortness of breath, chest pain, cough.  Has not had any abdominal pain, or diarrhea.  Presented to the emergency room at the insistence of her fianc.  On arrival she was found to be hypotensive with systolic blood pressure in the 80s, Had mild lactic acidosis, 2.2, initial sodium 124, potassium 2.3, with anion gap metabolic acidosis.  Initial white blood cell count was 14.1.  Cultures were sent, she was initiated on IV crystalloid, 30 mL/kg.  Following fluid volume resuscitation still had point of care ultrasound suggesting volume depletion and thus received more IV hydration.  IV antibiotics were initiated, she was started on norepinephrine infusion, and critical care asked to admit.  Pertinent  Medical History  Remote IV drug abuse.  Chronic bronchitis, history of seizure disorder, bipolar disease, anxiety. Significant Hospital Events: Including procedures, antibiotic start and stop dates in addition to other pertinent events   Admitted 10/6 with profound weakness, nausea, vomiting, hypotension.  Started on IV hydration, culture sent, empiric antibiotics initiated  Interim History / Subjective:  Remains on vasopressors, around 14 nor epi.   Central line was placed overnight.  Giving more IV fluids this morning.  Renal function improving.  Electrolyte abnormalities improving.  Needing ongoing potassium replacement.  Objective   Blood pressure 99/61, pulse (!) 101, temperature 97.6 F (36.4 C), temperature source Oral, resp. rate 13, height 5\' 6"  (1.676 m), weight 44.7 kg, SpO2 100 %.        Intake/Output Summary (Last 24 hours) at 05/02/2022 0955 Last data filed at 05/02/2022 07/02/2022 Gross per 24 hour  Intake 7397.34 ml  Output 1240 ml  Net 6157.34 ml    Filed Weights   05/01/22 1611 05/02/22 0500  Weight: 44.5 kg 44.7 kg    Examination: General: Cachectic 41 year old female resting in bed currently in no acute distress HENT: Normocephalic atraumatic temporal wasting appreciated she is edentulous Lungs: Clear to auscultation Cardiovascular: Regular rate and rhythm Abdomen: Soft not tender Extremities: Warm dry Neuro: Awake oriented  Resolved Hospital Problem list     Assessment & Plan:  Principal Problem:   Hypovolemic shock (HCC) Active Problems:   Severe dehydration   Nausea and vomiting   Disorders of fluid, electrolyte, and acid-base balance   High anion gap metabolic acidosis   Lactic acid acidosis   Hyponatremia   Hypokalemia   AKI (acute kidney injury) (HCC)   Uremia   Severe protein-calorie malnutrition (HCC)   Positive urine pregnancy test  Hypotension: Suspect hypovolemia given nausea vomiting poor p.o. intake. -- Status post aggressive IV fluid resuscitation, additional 1 L LR this morning, albumin as well -- Epinephrine, MAP goal greater than 65 -- Cultures --Consider stress dose  steroids if not improving --Consider midodrine, currently vomits after minimal p.o. intake   Sepsis from presumed UTI: With acute organ dysfunction including renal failure, leukocytosis.  Presumed UTI.  Was sent to emergency room for IV antibiotics 2 weeks ago and never received. -- Continue broad-spectrum with  vancomycin and cefepime for now -- Procalcitonin negative -- Urinalysis with pyuria and positive leukocyte esterase, will continue antibiotics for now vancomycin and cefepime given recent treatment with with ongoing pyuria,, with negative nitrite gram-positive UTIs possible -- Follow-up urine culture  Renal failure: Suspect hypovolemic as above.  Improving with fluids. -- Continue aggressive IV resuscitation   Nausea and vomiting: Chronic.  Shortly after eating.  Suspicious for anatomic lesion. -- Start with CT abdomen pel without contrast to assess for gastric outlet obstruction, esophageal mass etc. -- Consider GI consult for endoscopy if nothing on CT scan or for more diagnostic review recent CT scan is reviewed   Anion gap metabolic acidosis: Multifactorial due to uremia, renal failure, lactic acidosis, concern for starvation ketosis. Improved to resolved with fluids. -- trend lactacte -- Continue fluid resuscitation -- beta hydroxybutyrate elevated on admission   Hyponatremia: Combination of poor solute intake, hypovolemia -- Continue fluid resuscitation   Hypokalemia: Poor solute intake, nausea vomiting. -- Replace, recheck and replace as needed   BHCG positive: Likely reflective of hypovolemia or hemoconcentration. Repeat HCG negative after hydration.  Best Practice (right click and "Reselect all SmartList Selections" daily)   Diet/type: clear liquids DVT prophylaxis: prophylactic heparin  GI prophylaxis: PPI Lines: N/A Foley:  N/A Code Status:  full code Last date of multidisciplinary goals of care discussion [pending]  Labs   CBC: Recent Labs  Lab 05/01/22 1608 05/01/22 1614 05/01/22 1853 05/02/22 0604  WBC 14.1*  --  18.2* 11.4*  NEUTROABS 10.9*  --   --   --   HGB 11.1* 10.9* 9.4* 9.7*  HCT 30.0* 32.0* 26.0* 26.0*  MCV 89.0  --  91.2 88.4  PLT 484*  --  380 410*     Basic Metabolic Panel: Recent Labs  Lab 05/01/22 1608 05/01/22 1614 05/01/22 1853  05/02/22 0406 05/02/22 0604  NA 124* 121* 125* 128* 129*  K 2.3* 2.2* 2.6* 2.6* 2.4*  CL 87* 90* 96* 103 101  CO2 17*  --  12* 14* 15*  GLUCOSE 129* 126* 125* 112* 118*  BUN 151* >130* 123* 92* 86*  CREATININE 5.16* 5.70* 3.99* 2.93* 2.75*  CALCIUM 8.2*  --  6.7* 6.8* 6.8*  MG 3.2*  --   --  2.6*  --   PHOS  --   --   --  1.9*  --     GFR: Estimated Creatinine Clearance: 19.2 mL/min (A) (by C-G formula based on SCr of 2.75 mg/dL (H)). Recent Labs  Lab 05/01/22 1608 05/01/22 1853 05/01/22 2301 05/02/22 0406 05/02/22 0604  PROCALCITON  --  <0.10  --  <0.10  --   WBC 14.1* 18.2*  --   --  11.4*  LATICACIDVEN 2.2* 2.5* 2.6* 2.1*  --      Liver Function Tests: Recent Labs  Lab 05/01/22 1608  AST 19  ALT 10  ALKPHOS 83  BILITOT 0.7  PROT 6.2*  ALBUMIN 3.1*    No results for input(s): "LIPASE", "AMYLASE" in the last 168 hours. No results for input(s): "AMMONIA" in the last 168 hours.  ABG    Component Value Date/Time   TCO2 17 (L) 05/01/2022 1614     Coagulation Profile: Recent  Labs  Lab 05/01/22 1608  INR 1.2     Cardiac Enzymes: No results for input(s): "CKTOTAL", "CKMB", "CKMBINDEX", "TROPONINI" in the last 168 hours.  HbA1C: No results found for: "HGBA1C"  CBG: Recent Labs  Lab 05/01/22 2127 05/01/22 2339 05/02/22 0324 05/02/22 0738  GLUCAP 136* 114* 94 132*    Review of Systems:   N/a  Past Medical History:  She,  has a past medical history of Anxiety, Bipolar 1 disorder (HCC), Bursitis of hip, Chronic bronchitis, Hip joint pain, and Seizures (HCC).   Surgical History:   Past Surgical History:  Procedure Laterality Date   CESAREAN SECTION     2002   KNEE ARTHROSCOPY     MOUTH SURGERY  2010     Social History:   reports that she has been smoking cigarettes. She has been smoking an average of .5 packs per day. She has never used smokeless tobacco. She reports current alcohol use. She reports that she does not use drugs.    Family History:  Her family history includes Cancer in an other family member; Diabetes in an other family member; Hypertension in her mother.   Allergies Allergies  Allergen Reactions   Cucumber Extract Anaphylaxis   Keflex [Cephalexin] Nausea And Vomiting   Macrobid [Nitrofurantoin Monohydrate Macrocrystals] Hives and Swelling   Naproxen Nausea And Vomiting   Penicillins Nausea And Vomiting    Heavy vomitting Did PCN reaction causing immediate rash, facial/tongue/throat swelling, SOB or lightheadedness with hypotension: No swelling, but im not sure if i got a rash as i was red all over already Did PCN reaction causing severe rash involving mucus membranes or skin necrosis: No Did a PCN reaction that required hospitalization Yes went to the ED Has patient had a PCN reaction occurring within the last 10 years: No-childhood allergy If all of the above answers are "NO", then may proceed with   Tramadol Nausea And Vomiting   Watermelon Concentrate Diarrhea   Darvocet [Propoxyphene N-Acetaminophen] Nausea And Vomiting and Other (See Comments)    migraines   Flagyl [Metronidazole Hcl] Hives, Swelling and Rash   Ketorolac Nausea And Vomiting and Other (See Comments)    migraines   Ultram [Tramadol Hcl] Nausea And Vomiting and Other (See Comments)    migraines   Vicodin [Hydrocodone-Acetaminophen] Nausea And Vomiting and Other (See Comments)    migraines     Home Medications  Prior to Admission medications   Medication Sig Start Date End Date Taking? Authorizing Provider  azithromycin (ZITHROMAX) 250 MG tablet Take 1 tablet (250 mg total) by mouth daily. Take 1 every day until finished. Patient not taking: Reported on 05/18/2016 08/08/15   Mesner, Barbara Cower, MD  beclomethasone (QVAR) 40 MCG/ACT inhaler Inhale 1 puff into the lungs 2 (two) times daily.    [provider]  cyclobenzaprine (FLEXERIL) 10 MG tablet take 1 tablet twice daily 05/12/16   [provider]   gabapentin (NEURONTIN) 100 MG capsule take 1 capsule three times daily 05/02/16   [provider]  ibuprofen (ADVIL,MOTRIN) 200 MG tablet Take 600 mg by mouth every 6 (six) hours as needed for mild pain.    [provider]  PROVENTIL HFA 108 (90 Base) MCG/ACT inhaler Inhale 1 puff into the lungs every 4 (four) hours as needed. Shortness of breathe. 07/04/15   [provider]  traMADol (ULTRAM) 50 MG tablet Take 1 tablet (50 mg total) by mouth every 6 (six) hours as needed. 05/18/16   Eyvonne Mechanic, PA-C  Critical care time:    CRITICAL CARE Performed by: Bonna Gains Auda Finfrock   Total critical care time: 35 minutes  Critical care time was exclusive of separately billable procedures and treating other patients.  Critical care was necessary to treat or prevent imminent or life-threatening deterioration.  Critical care was time spent personally by me on the following activities: development of treatment plan with patient and/or surrogate as well as nursing, discussions with consultants, evaluation of patient's response to treatment, examination of patient, obtaining history from patient or surrogate, ordering and performing treatments and interventions, ordering and review of laboratory studies, ordering and review of radiographic studies, pulse oximetry and re-evaluation of patient's condition.  Lanier Clam, MD [place] for contact info OR # [phone] if no answer

## 2022-05-02 NOTE — Progress Notes (Signed)
eLink Physician-Brief Progress Note Patient Name: Jamie Shields DOB: 10-31-1980 MRN: [id number]   Date of Service  05/02/2022  HPI/Events of Note  Patient with history of seizures on Keppra which she has not taken in weeks d/t N/V. Patient states that she is "feeling shaky" tonight. Nursing concerned that this might be a seizure prodrome.   eICU Interventions  Plan: Keppra 1 gm IV now, then Keppra 500 mg IV Q 12 hours.      Intervention Category Major Interventions: Other:  Lysle Dingwall 05/02/2022, 11:54 PM

## 2022-05-02 NOTE — Procedures (Signed)
Central Venous Catheter Insertion Procedure Note  Jamie Shields  [id number]  1980/08/05  Date:05/02/22  Time:3:09 AM   Provider Performing:Jamie Shields   Procedure: Insertion of Non-tunneled Central Venous [phone]) with US guidance ([id number])   Indication(s) Medication administration  Consent Risks of the procedure as well as the alternatives and risks of each were explained to the patient and/or caregiver.  Consent for the procedure was obtained and is signed in the bedside chart  Anesthesia Topical only with 1% lidocaine   Timeout Verified patient identification, verified procedure, site/side was marked, verified correct patient position, special equipment/implants available, medications/allergies/relevant history reviewed, required imaging and test results available.  Sterile Technique Maximal sterile technique including full sterile barrier drape, hand hygiene, sterile gown, sterile gloves, mask, hair covering, sterile ultrasound probe cover (if used).  Procedure Description Area of catheter insertion was cleaned with chlorhexidine and draped in sterile fashion.  With real-time ultrasound guidance a central venous catheter was placed into the right internal jugular vein. Nonpulsatile blood flow and easy flushing noted in all ports.  The catheter was sutured in place and sterile dressing applied.  Complications/Tolerance None; patient tolerated the procedure well. Chest X-ray is ordered to verify placement for internal jugular or subclavian cannulation.   Chest x-ray is not ordered for femoral cannulation.  EBL Minimal  Specimen(s) None  Jamie Shields [facility] 05/02/2022, 3:10 AM  Please see [email or web address] for pager details.  From 7A-7P if no response, please call [phone]. After hours, please call ELink [phone].

## 2022-05-02 NOTE — Progress Notes (Signed)
eLink Physician-Brief Progress Note Patient Name: Jamie Shields DOB: [birth year] MRN: [id number]   Date of Service  05/02/2022  HPI/Events of Note  Hypotension - BP = 85/44 with MAP = 58 on Norepinephrine IV infusion via PIV at 10 mcg/min.  eICU Interventions  Plan: Bolus with 0.9 NaCl 1 liter IV over 1 hour now. Will relay message to PCCM ground team that patient will require central venous access.      Intervention Category Major Interventions: Hypotension - evaluation and management  Wesson Stith Eugene 05/02/2022, 2:03 AM

## 2022-05-03 ENCOUNTER — Inpatient Hospital Stay (HOSPITAL_COMMUNITY): Payer: Medicaid Other

## 2022-05-03 DIAGNOSIS — R571 Hypovolemic shock: Secondary | ICD-10-CM | POA: Diagnosis not present

## 2022-05-03 LAB — CBC
HCT: 27.4 % — ABNORMAL LOW (ref 36.0–46.0)
Hemoglobin: 10 g/dL — ABNORMAL LOW (ref 12.0–15.0)
MCH: 33.1 pg (ref 26.0–34.0)
MCHC: 36.5 g/dL — ABNORMAL HIGH (ref 30.0–36.0)
MCV: 90.7 fL (ref 80.0–100.0)
Platelets: 443 10*3/uL — ABNORMAL HIGH (ref 150–400)
RBC: 3.02 MIL/uL — ABNORMAL LOW (ref 3.87–5.11)
RDW: 16.2 % — ABNORMAL HIGH (ref 11.5–15.5)
WBC: 13.5 10*3/uL — ABNORMAL HIGH (ref 4.0–10.5)
nRBC: 0 % (ref 0.0–0.2)

## 2022-05-03 LAB — GLUCOSE, CAPILLARY
Glucose-Capillary: 100 mg/dL — ABNORMAL HIGH (ref 70–99)
Glucose-Capillary: 161 mg/dL — ABNORMAL HIGH (ref 70–99)
Glucose-Capillary: 169 mg/dL — ABNORMAL HIGH (ref 70–99)
Glucose-Capillary: 172 mg/dL — ABNORMAL HIGH (ref 70–99)
Glucose-Capillary: 80 mg/dL (ref 70–99)
Glucose-Capillary: 96 mg/dL (ref 70–99)

## 2022-05-03 LAB — BASIC METABOLIC PANEL
Anion gap: 12 (ref 5–15)
BUN: 53 mg/dL — ABNORMAL HIGH (ref 6–20)
CO2: 15 mmol/L — ABNORMAL LOW (ref 22–32)
Calcium: 7.5 mg/dL — ABNORMAL LOW (ref 8.9–10.3)
Chloride: 101 mmol/L (ref 98–111)
Creatinine, Ser: 2 mg/dL — ABNORMAL HIGH (ref 0.44–1.00)
GFR, Estimated: 32 mL/min — ABNORMAL LOW (ref >60)
Glucose, Bld: 113 mg/dL — ABNORMAL HIGH (ref 70–99)
Potassium: 3.1 mmol/L — ABNORMAL LOW (ref 3.5–5.1)
Sodium: 128 mmol/L — ABNORMAL LOW (ref 135–145)

## 2022-05-03 LAB — ECHOCARDIOGRAM COMPLETE
Area-P 1/2: 4.21 cm2
Height: 66 in
P 1/2 time: 200 msec
S' Lateral: 2.3 cm
Weight: 1576.73 oz

## 2022-05-03 LAB — PROCALCITONIN: Procalcitonin: <0.1 ng/mL

## 2022-05-03 MED ORDER — LACTATED RINGERS IV BOLUS
1000.0000 mL | Freq: Once | INTRAVENOUS | Status: AC
  Administered 2022-05-03: 1000 mL via INTRAVENOUS

## 2022-05-03 MED ORDER — SODIUM CHLORIDE 0.9 % IV SOLN
6.2500 mg | Freq: Four times a day (QID) | INTRAVENOUS | Status: DC | PRN
  Administered 2022-05-03 – 2022-05-04 (×4): 6.25 mg via INTRAVENOUS
  Filled 2022-05-03 (×5): qty 0.25

## 2022-05-03 MED ORDER — HYDROCORTISONE SOD SUC (PF) 100 MG IJ SOLR
100.0000 mg | Freq: Three times a day (TID) | INTRAMUSCULAR | Status: DC
  Administered 2022-05-03 – 2022-05-07 (×13): 100 mg via INTRAVENOUS
  Filled 2022-05-03 (×15): qty 2

## 2022-05-03 MED ORDER — FENTANYL CITRATE PF 50 MCG/ML IJ SOSY
12.5000 ug | PREFILLED_SYRINGE | Freq: Once | INTRAMUSCULAR | Status: AC
  Administered 2022-05-03: 12.5 ug via INTRAVENOUS
  Filled 2022-05-03: qty 1

## 2022-05-03 MED ORDER — ACETAMINOPHEN 160 MG/5ML PO SOLN
650.0000 mg | Freq: Four times a day (QID) | ORAL | Status: DC | PRN
  Administered 2022-05-03 – 2022-05-06 (×9): 650 mg via ORAL
  Filled 2022-05-03 (×9): qty 20.3

## 2022-05-03 MED ORDER — POTASSIUM CHLORIDE 10 MEQ/50ML IV SOLN
10.0000 meq | INTRAVENOUS | Status: AC
  Administered 2022-05-03 (×4): 10 meq via INTRAVENOUS
  Filled 2022-05-03 (×4): qty 50

## 2022-05-03 MED ORDER — VASOPRESSIN 20 UNITS/100 ML INFUSION FOR SHOCK
0.0200 [IU]/min | INTRAVENOUS | Status: DC
  Administered 2022-05-03 (×2): 0.03 [IU]/min via INTRAVENOUS
  Administered 2022-05-04: 0.02 [IU]/min via INTRAVENOUS
  Administered 2022-05-04: 0.03 [IU]/min via INTRAVENOUS
  Filled 2022-05-03 (×4): qty 100

## 2022-05-03 MED ORDER — ALBUMIN HUMAN 25 % IV SOLN
25.0000 g | Freq: Once | INTRAVENOUS | Status: AC
  Administered 2022-05-03: 25 g via INTRAVENOUS
  Filled 2022-05-03: qty 100

## 2022-05-03 MED ORDER — SODIUM CHLORIDE 0.9 % IV SOLN
2.0000 g | INTRAVENOUS | Status: DC
  Administered 2022-05-03: 2 g via INTRAVENOUS
  Filled 2022-05-03: qty 12.5

## 2022-05-03 MED ORDER — NOREPINEPHRINE 16 MG/250ML-% IV SOLN
0.0000 ug/min | INTRAVENOUS | Status: DC
  Administered 2022-05-03: 30 ug/min via INTRAVENOUS
  Administered 2022-05-03 – 2022-05-04 (×3): 29 ug/min via INTRAVENOUS
  Administered 2022-05-05: 18 ug/min via INTRAVENOUS
  Administered 2022-05-05: 8 ug/min via INTRAVENOUS
  Filled 2022-05-03 (×6): qty 250

## 2022-05-03 MED ORDER — MIDODRINE HCL 5 MG PO TABS
10.0000 mg | ORAL_TABLET | Freq: Three times a day (TID) | ORAL | Status: DC
  Administered 2022-05-03 – 2022-05-05 (×7): 10 mg via ORAL
  Filled 2022-05-03 (×7): qty 2

## 2022-05-03 NOTE — Progress Notes (Signed)
[facility] Progress Note Patient Name: Jamie Shields DOB: 1981/06/22 MRN: [id number]   Date of Service  05/03/2022  HPI/Events of Note  Hypokalemia - K+ = 3.1 and Creatinine = 2.0.   eICU Interventions  Will replace K+.     Intervention Category Major Interventions: Electrolyte abnormality - evaluation and management  Natanya Holecek Eugene 05/03/2022, 6:24 AM

## 2022-05-03 NOTE — Progress Notes (Signed)
*  PRELIMINARY RESULTS* Echocardiogram 2D Echocardiogram has been performed.  [name] M 05/03/2022, 10:06 AM

## 2022-05-03 NOTE — Progress Notes (Signed)
PHARMACY NOTE:  ANTIMICROBIAL RENAL DOSAGE ADJUSTMENT  Current antimicrobial regimen includes a mismatch between antimicrobial dosage and estimated renal function.  As per policy approved by the Pharmacy & Therapeutics and Medical Executive Committees, the antimicrobial dosage will be adjusted accordingly.  Current antimicrobial dosage:  Cefepime 1 gm IV q24hr  Indication: sepsis  Renal Function:  Estimated Creatinine Clearance: 26.4 mL/min (A) (by C-G formula based on SCr of 2 mg/dL (H)).     Antimicrobial dosage has been changed to:  Cefepime 2 gm IV q24hr  Thank you for allowing pharmacy to be a part of this patient's care.  Alanda Slim, PharmD, [facility] Clinical Pharmacist Please see AMION for all Pharmacists' Contact Phone Numbers 05/03/2022, 8:23 AM

## 2022-05-03 NOTE — Progress Notes (Signed)
eLink Physician-Brief Progress Note Patient Name: Jamie Shields DOB: [birth year] MRN: [id number]   Date of Service  05/03/2022  HPI/Events of Note  Patient c/o 10/10 knee pain. No history of trauma. Etiology of knee pain is not clear. She is allergic to multiple medications including Naproxen, Tramadol, Darvocet, Vicodin and Ultram.   eICU Interventions  Plan: Fentanyl 12.5 mcg IV X 1. Defer exam and further w/u for knee pain to PCCM rounding team.      Intervention Category Major Interventions: Other:  Jamie Shields Cornelia Copa 05/03/2022, 1:11 AM

## 2022-05-03 NOTE — Progress Notes (Addendum)
NAME:  Jamie Shields, MRN:  [id number], DOB:  1980-10-06, LOS: 2 ADMISSION DATE:  05/01/2022, CONSULTATION DATE:  10/6 REFERRING MD:  Oswaldo Milian, CHIEF COMPLAINT:  shock    History of Present Illness:  41 year old female w/ remote IVDA. Presents to ER 10/6 w/ cc: Severe weakness, nausea and vomiting.  Per the patient she has had ongoing nausea and vomiting for over 4 weeks.  Initially seen by primary care provider about 4 weeks prior at which time was diagnosed with a urinary tract infection and prescribed Cipro.  She has continued to have nausea and vomiting, reports the vomiting occurs almost immediately after eating.  She is essentially been at home over the last several weeks getting weaker and weaker, only able to keep down clear liquids and broth.  She is denied shortness of breath, chest pain, cough.  Has not had any abdominal pain, or diarrhea.  Presented to the emergency room at the insistence of her fianc.  On arrival she was found to be hypotensive with systolic blood pressure in the 80s, Had mild lactic acidosis, 2.2, initial sodium 124, potassium 2.3, with anion gap metabolic acidosis.  Initial white blood cell count was 14.1.  Cultures were sent, she was initiated on IV crystalloid, 30 mL/kg.  Following fluid volume resuscitation still had point of care ultrasound suggesting volume depletion and thus received more IV hydration.  IV antibiotics were initiated, she was started on norepinephrine infusion, and critical care asked to admit.  Pertinent  Medical History  Remote IV drug abuse.  Chronic bronchitis, history of seizure disorder, bipolar disease, anxiety. Significant Hospital Events: Including procedures, antibiotic start and stop dates in addition to other pertinent events   Admitted 10/6 with profound weakness, nausea, vomiting, hypotension.  Started on IV hydration, culture sent, empiric antibiotics initiated  Interim History / Subjective:  Remains on vasopressors, around 14 nor epi.   Central line was placed overnight.  Giving more IV fluids this morning.  Renal function improving.  Electrolyte abnormalities improving.  Needing ongoing potassium replacement.  Objective   Blood pressure 111/62, pulse (!) 114, temperature 98.5 F (36.9 C), temperature source Axillary, resp. rate 16, height 5\' 6"  (1.676 m), weight 44.7 kg, SpO2 100 %.        Intake/Output Summary (Last 24 hours) at 05/03/2022 0914 Last data filed at 05/03/2022 0908 Gross per 24 hour  Intake 5651.72 ml  Output 1725 ml  Net 3926.72 ml    Filed Weights   05/01/22 1611 05/02/22 0500  Weight: 44.5 kg 44.7 kg    Examination: General: Cachectic 41 year old female resting in bed currently in no acute distress HENT: Normocephalic atraumatic temporal wasting appreciated she is edentulous Lungs: Clear to auscultation Cardiovascular: Regular rate and rhythm Abdomen: Soft not tender Extremities: Warm dry Neuro: Awake oriented  Resolved Hospital Problem list     Assessment & Plan:  Principal Problem:   Hypovolemic shock (HCC) Active Problems:   Severe dehydration   Nausea and vomiting   Disorders of fluid, electrolyte, and acid-base balance   High anion gap metabolic acidosis   Lactic acid acidosis   Hyponatremia   Hypokalemia   AKI (acute kidney injury) ([place])   Uremia   Severe protein-calorie malnutrition (HCC)   Positive urine pregnancy test  Hypotension: Suspect hypovolemia given nausea vomiting poor p.o. intake. Persisting despite aggressive resuscitation -- Status post aggressive IV fluid resuscitation, additional 1 L LR this morning, albumin as well -- Norepi, add vaso, MAP goal greater than  53 -- Cultures NGTD --Stress dose steroids added 10/8 --Midodrine added   Sepsis from presumed UTI: With acute organ dysfunction including renal failure, leukocytosis.  Presumed UTI.  Was sent to emergency room for IV antibiotics 2 weeks ago and never received. -- Cefepime, vanc d/c'd --  Procalcitonin negative -- Follow-up urine culture, NGTD  Renal failure: Suspect hypovolemic as above.  Improving with fluids. -- Continue aggressive IV resuscitation   Nausea and vomiting: Chronic.  Shortly after eating.  Suspicious for anatomic lesion. Now with ongoing hypotension adrenal insufficiency considered.  -- CT A/P without etiology -- consider GI consult, working up adrenal insuffiencey   Anion gap metabolic acidosis: Multifactorial due to uremia, renal failure, lactic acidosis, concern for starvation ketosis. Improved to resolved with fluids. -- Continue fluid resuscitation -- beta hydroxybutyrate elevated on admission   Hyponatremia: Combination of poor solute intake, hypovolemia. Concern for adrenal insufficiency. -- Continue fluid resuscitation   Hypokalemia: Poor solute intake, nausea vomiting. -- Replace, recheck and replace as needed   BHCG positive: Likely reflective of hypovolemia or hemoconcentration. Repeat HCG negative after hydration.  Possible adrenal insufficiency --stress dose steroids --MRI brain  Best Practice (right click and "Reselect all SmartList Selections" daily)   Diet/type: clear liquids DVT prophylaxis: prophylactic heparin  GI prophylaxis: PPI Lines: N/A Foley:  N/A Code Status:  full code Last date of multidisciplinary goals of care discussion [pending]  Labs   CBC: Recent Labs  Lab 05/01/22 1608 05/01/22 1614 05/01/22 1853 05/02/22 0604  WBC 14.1*  --  18.2* 11.4*  NEUTROABS 10.9*  --   --   --   HGB 11.1* 10.9* 9.4* 9.7*  HCT 30.0* 32.0* 26.0* 26.0*  MCV 89.0  --  91.2 88.4  PLT 484*  --  380 410*     Basic Metabolic Panel: Recent Labs  Lab 05/01/22 1608 05/01/22 1614 05/01/22 1853 05/02/22 0406 05/02/22 0604 05/02/22 1627 05/03/22 0432  NA 124*   < > 125* 128* 129* 128* 128*  K 2.3*   < > 2.6* 2.6* 2.4* 3.3* 3.1*  CL 87*   < > 96* 103 101 103 101  CO2 17*  --  12* 14* 15* 14* 15*  GLUCOSE 129*   < > 125*  112* 118* 116* 113*  BUN 151*   < > 123* 92* 86* 67* 53*  CREATININE 5.16*   < > 3.99* 2.93* 2.75* 2.23* 2.00*  CALCIUM 8.2*  --  6.7* 6.8* 6.8* 7.3* 7.5*  MG 3.2*  --   --  2.6*  --   --   --   PHOS  --   --   --  1.9*  --   --   --    < > = values in this interval not displayed.    GFR: Estimated Creatinine Clearance: 26.4 mL/min (A) (by C-G formula based on SCr of 2 mg/dL (H)). Recent Labs  Lab 05/01/22 1608 05/01/22 1853 05/01/22 2301 05/02/22 0406 05/02/22 0604 05/03/22 0432  PROCALCITON  --  <0.10  --  <0.10  --  <0.10  WBC 14.1* 18.2*  --   --  11.4*  --   LATICACIDVEN 2.2* 2.5* 2.6* 2.1*  --   --      Liver Function Tests: Recent Labs  Lab 05/01/22 1608  AST 19  ALT 10  ALKPHOS 83  BILITOT 0.7  PROT 6.2*  ALBUMIN 3.1*    No results for input(s): "LIPASE", "AMYLASE" in the last 168 hours. No results for  input(s): "AMMONIA" in the last 168 hours.  ABG    Component Value Date/Time   TCO2 17 (L) 05/01/2022 1614     Coagulation Profile: Recent Labs  Lab 05/01/22 1608  INR 1.2     Cardiac Enzymes: No results for input(s): "CKTOTAL", "CKMB", "CKMBINDEX", "TROPONINI" in the last 168 hours.  HbA1C: No results found for: "HGBA1C"  CBG: Recent Labs  Lab 05/02/22 1527 05/02/22 1934 05/02/22 2323 05/03/22 0331 05/03/22 0825  GLUCAP 90 101* 100* 100* 96     Review of Systems:   N/a  Past Medical History:  She,  has a past medical history of Anxiety, Bipolar 1 disorder ([place]), Bursitis of hip, Chronic bronchitis, Hip joint pain, and Seizures ([place]).   Surgical History:   Past Surgical History:  Procedure Laterality Date   CESAREAN SECTION     2002   KNEE ARTHROSCOPY     MOUTH SURGERY  2010     Social History:   reports that she has been smoking cigarettes. She has been smoking an average of .5 packs per day. She has never used smokeless tobacco. She reports current alcohol use. She reports that she does not use drugs.   Family History:   Her family history includes Cancer in an other family member; Diabetes in an other family member; Hypertension in her mother.   Allergies Allergies  Allergen Reactions   Cucumber Extract Anaphylaxis   Keflex [Cephalexin] Nausea And Vomiting   Macrobid [Nitrofurantoin Monohydrate Macrocrystals] Hives and Swelling   Naproxen Nausea And Vomiting   Penicillins Nausea And Vomiting    Heavy vomitting Did PCN reaction causing immediate rash, facial/tongue/throat swelling, SOB or lightheadedness with hypotension: No swelling, but im not sure if i got a rash as i was red all over already Did PCN reaction causing severe rash involving mucus membranes or skin necrosis: No Did a PCN reaction that required hospitalization Yes went to the ED Has patient had a PCN reaction occurring within the last 10 years: No-childhood allergy If all of the above answers are "NO", then may proceed with   Tramadol Nausea And Vomiting   Watermelon Concentrate Diarrhea   Darvocet [Propoxyphene N-Acetaminophen] Nausea And Vomiting and Other (See Comments)    migraines   Flagyl [Metronidazole Hcl] Hives, Swelling and Rash   Ketorolac Nausea And Vomiting and Other (See Comments)    migraines   Ultram [Tramadol Hcl] Nausea And Vomiting and Other (See Comments)    migraines   Vicodin [Hydrocodone-Acetaminophen] Nausea And Vomiting and Other (See Comments)    migraines     Home Medications  Prior to Admission medications   Medication Sig Start Date End Date Taking? Authorizing Provider  azithromycin (ZITHROMAX) 250 MG tablet Take 1 tablet (250 mg total) by mouth daily. Take 1 every day until finished. Patient not taking: Reported on 05/18/2016 08/08/15   Mesner, Corene Cornea, MD  beclomethasone (QVAR) 40 MCG/ACT inhaler Inhale 1 puff into the lungs 2 (two) times daily.    [provider]  cyclobenzaprine (FLEXERIL) 10 MG tablet take 1 tablet twice daily 05/12/16   [provider]  gabapentin (NEURONTIN) 100  MG capsule take 1 capsule three times daily 05/02/16   [provider]  ibuprofen (ADVIL,MOTRIN) 200 MG tablet Take 600 mg by mouth every 6 (six) hours as needed for mild pain.    [provider]  PROVENTIL HFA 108 (90 Base) MCG/ACT inhaler Inhale 1 puff into the lungs every 4 (four) hours as needed. Shortness of breathe.  07/04/15   [provider]  traMADol (ULTRAM) 50 MG tablet Take 1 tablet (50 mg total) by mouth every 6 (six) hours as needed. 05/18/16   Okey Regal, PA-C     Critical care time:    CRITICAL CARE Performed by: Lanier Clam   Total critical care time: 38 minutes  Critical care time was exclusive of separately billable procedures and treating other patients.  Critical care was necessary to treat or prevent imminent or life-threatening deterioration.  Critical care was time spent personally by me on the following activities: development of treatment plan with patient and/or surrogate as well as nursing, discussions with consultants, evaluation of patient's response to treatment, examination of patient, obtaining history from patient or surrogate, ordering and performing treatments and interventions, ordering and review of laboratory studies, ordering and review of radiographic studies, pulse oximetry and re-evaluation of patient's condition.  Lanier Clam, MD [place] for contact info OR # [phone] if no answer

## 2022-05-03 NOTE — Progress Notes (Signed)
Medications, necklace and VAPE pen sent home with Armando Reichert

## 2022-05-03 NOTE — Progress Notes (Signed)
Patient transported to and from CT uneventful.

## 2022-05-04 DIAGNOSIS — G40909 Epilepsy, unspecified, not intractable, without status epilepticus: Secondary | ICD-10-CM

## 2022-05-04 DIAGNOSIS — R579 Shock, unspecified: Secondary | ICD-10-CM | POA: Diagnosis not present

## 2022-05-04 DIAGNOSIS — N179 Acute kidney failure, unspecified: Secondary | ICD-10-CM | POA: Diagnosis not present

## 2022-05-04 DIAGNOSIS — F319 Bipolar disorder, unspecified: Secondary | ICD-10-CM

## 2022-05-04 LAB — CBC
HCT: 28.1 % — ABNORMAL LOW (ref 36.0–46.0)
Hemoglobin: 10 g/dL — ABNORMAL LOW (ref 12.0–15.0)
MCH: 32.8 pg (ref 26.0–34.0)
MCHC: 35.6 g/dL (ref 30.0–36.0)
MCV: 92.1 fL (ref 80.0–100.0)
Platelets: 474 10*3/uL — ABNORMAL HIGH (ref 150–400)
RBC: 3.05 MIL/uL — ABNORMAL LOW (ref 3.87–5.11)
RDW: 17.1 % — ABNORMAL HIGH (ref 11.5–15.5)
WBC: 8.6 10*3/uL (ref 4.0–10.5)
nRBC: 0 % (ref 0.0–0.2)

## 2022-05-04 LAB — GLUCOSE, CAPILLARY
Glucose-Capillary: 109 mg/dL — ABNORMAL HIGH (ref 70–99)
Glucose-Capillary: 115 mg/dL — ABNORMAL HIGH (ref 70–99)
Glucose-Capillary: 115 mg/dL — ABNORMAL HIGH (ref 70–99)
Glucose-Capillary: 132 mg/dL — ABNORMAL HIGH (ref 70–99)
Glucose-Capillary: 155 mg/dL — ABNORMAL HIGH (ref 70–99)
Glucose-Capillary: 74 mg/dL (ref 70–99)

## 2022-05-04 LAB — BASIC METABOLIC PANEL
Anion gap: 15 (ref 5–15)
BUN: 36 mg/dL — ABNORMAL HIGH (ref 6–20)
CO2: 11 mmol/L — ABNORMAL LOW (ref 22–32)
Calcium: 7.7 mg/dL — ABNORMAL LOW (ref 8.9–10.3)
Chloride: 107 mmol/L (ref 98–111)
Creatinine, Ser: 1.64 mg/dL — ABNORMAL HIGH (ref 0.44–1.00)
GFR, Estimated: 40 mL/min — ABNORMAL LOW (ref >60)
Glucose, Bld: 170 mg/dL — ABNORMAL HIGH (ref 70–99)
Potassium: 3.9 mmol/L (ref 3.5–5.1)
Sodium: 133 mmol/L — ABNORMAL LOW (ref 135–145)

## 2022-05-04 MED ORDER — THIAMINE MONONITRATE 100 MG PO TABS
100.0000 mg | ORAL_TABLET | Freq: Every day | ORAL | Status: AC
  Administered 2022-05-04 – 2022-05-10 (×7): 100 mg via ORAL
  Filled 2022-05-04 (×7): qty 1

## 2022-05-04 MED ORDER — PANTOPRAZOLE SODIUM 40 MG PO TBEC
40.0000 mg | DELAYED_RELEASE_TABLET | Freq: Two times a day (BID) | ORAL | Status: DC
  Administered 2022-05-04 (×2): 40 mg via ORAL
  Filled 2022-05-04 (×2): qty 1

## 2022-05-04 MED ORDER — LACTATED RINGERS IV BOLUS
1000.0000 mL | Freq: Once | INTRAVENOUS | Status: AC
  Administered 2022-05-04: 1000 mL via INTRAVENOUS

## 2022-05-04 MED ORDER — ADULT MULTIVITAMIN W/MINERALS CH
1.0000 | ORAL_TABLET | Freq: Every day | ORAL | Status: DC
  Administered 2022-05-04 – 2022-05-14 (×11): 1 via ORAL
  Filled 2022-05-04 (×11): qty 1

## 2022-05-04 MED ORDER — BUSPIRONE HCL 5 MG PO TABS
10.0000 mg | ORAL_TABLET | Freq: Two times a day (BID) | ORAL | Status: DC
  Administered 2022-05-04 – 2022-05-07 (×7): 10 mg via ORAL
  Filled 2022-05-04 (×7): qty 2

## 2022-05-04 MED ORDER — ALPRAZOLAM 0.25 MG PO TABS
0.2500 mg | ORAL_TABLET | Freq: Three times a day (TID) | ORAL | Status: DC | PRN
  Administered 2022-05-04 – 2022-05-06 (×4): 0.25 mg via ORAL
  Filled 2022-05-04 (×4): qty 1

## 2022-05-04 MED ORDER — LEVETIRACETAM 500 MG PO TABS: 500.0000 mg | ORAL_TABLET | Freq: Two times a day (BID) | ORAL | Status: DC

## 2022-05-04 MED ORDER — LIDOCAINE HCL URETHRAL/MUCOSAL 2 % EX GEL
1.0000 | Freq: Once | CUTANEOUS | Status: DC
  Filled 2022-05-04: qty 6

## 2022-05-04 MED ORDER — ENSURE ENLIVE PO LIQD
237.0000 mL | Freq: Three times a day (TID) | ORAL | Status: DC
  Administered 2022-05-04: 237 mL via ORAL

## 2022-05-04 MED ORDER — ENSURE ENLIVE PO LIQD
237.0000 mL | Freq: Two times a day (BID) | ORAL | Status: DC
  Administered 2022-05-05 – 2022-05-10 (×7): 237 mL via ORAL

## 2022-05-04 MED ORDER — OSMOLITE 1.2 CAL PO LIQD
1000.0000 mL | ORAL | Status: DC
  Filled 2022-05-04 (×2): qty 1000

## 2022-05-04 MED ORDER — LEVETIRACETAM 500 MG PO TABS
500.0000 mg | ORAL_TABLET | Freq: Two times a day (BID) | ORAL | Status: DC
  Administered 2022-05-04 – 2022-05-14 (×20): 500 mg via ORAL
  Filled 2022-05-04 (×20): qty 1

## 2022-05-04 NOTE — Progress Notes (Signed)
NAME:  Jamie Shields, MRN:  [id number], DOB:  1980-11-25, LOS: 3 ADMISSION DATE:  05/01/2022, CONSULTATION DATE:  10/6 REFERRING MD:  Cyndee Brightly, CHIEF COMPLAINT:  shock    History of Present Illness:  41 year old female w/ remote IVDA. Presents to ER 10/6 w/ cc: Severe weakness, nausea and vomiting.  Per the patient she has had ongoing nausea and vomiting for over 4 weeks.  Initially seen by primary care provider about 4 weeks prior at which time was diagnosed with a urinary tract infection and prescribed Cipro.  She has continued to have nausea and vomiting, reports the vomiting occurs almost immediately after eating.  She is essentially been at home over the last several weeks getting weaker and weaker, only able to keep down clear liquids and broth.  She is denied shortness of breath, chest pain, cough.  Has not had any abdominal pain, or diarrhea.  Presented to the emergency room at the insistence of her fianc.  On arrival she was found to be hypotensive with systolic blood pressure in the 80s, Had mild lactic acidosis, 2.2, initial sodium 124, potassium 2.3, with anion gap metabolic acidosis.  Initial white blood cell count was 14.1.  Cultures were sent, she was initiated on IV crystalloid, 30 mL/kg.  Following fluid volume resuscitation still had point of care ultrasound suggesting volume depletion and thus received more IV hydration.  IV antibiotics were initiated, she was started on norepinephrine infusion, and critical care asked to admit.  Pertinent  Medical History  Remote IV drug abuse.  Chronic bronchitis, history of seizure disorder, bipolar disease, anxiety. Significant Hospital Events: Including procedures, antibiotic start and stop dates in addition to other pertinent events   Admitted 10/6 with profound weakness, nausea, vomiting, hypotension.  Started on IV hydration, culture sent, empiric antibiotics initiated 10/7 CT abd/pelvis distended gallbladder but no gallstones or gallbladder  wall thickening.  Small left pleural effusion anasarca.  Central line placed 10/8 CT head negative stress dose steroids started still on pressors, midodrine initiated.  Echocardiogram obtained EF 70 to 75% hyperdynamic diastolic parameters normal no evidence of valvular vegetation.  The aortic valve showed severe regurgitation 10/9 blood pressure goal changed to systolic blood pressure greater than 90 added PPI for ongoing "heartburn".  Interim History / Subjective:  No distress.  Still requiring fairly high-dose pressors  Objective   Blood pressure (Abnormal) 108/59, pulse (Abnormal) 104, temperature 98.6 F (37 C), temperature source Axillary, resp. rate 19, height 5\' 6"  (1.676 m), weight 44.7 kg, SpO2 100 %.        Intake/Output Summary (Last 24 hours) at 05/04/2022 0826 Last data filed at 05/04/2022 0606 Gross per 24 hour  Intake 2660.97 ml  Output 875 ml  Net 1785.97 ml   Filed Weights   05/01/22 1611 05/02/22 0500  Weight: 44.5 kg 44.7 kg    Examination: General 41 year old chronically ill-appearing female resting in bed she is in no acute distress HEENT normocephalic atraumatic temporal wasting noted, mucous membranes moist, no JVD  Pulmonary: Clear diminished bases no accessory use Cardiac regular rate and rhythm Abdomen soft not tender Extremities warm dry now has 3+ anasarca Neuro awake oriented no focal deficits  Resolved Hospital Problem list   Anion gap metabolic acidosis: Multifactorial due to uremia, renal failure, lactic acidosis, concern for starvation ketosis.  BHCG positive: Likely reflective of hypovolemia or hemoconcentration. Repeat HCG negative after hydration. Assessment & Plan:  Principal Problem:   Hypovolemic shock (HCC) Active Problems:   Severe  dehydration   Nausea and vomiting   Disorders of fluid, electrolyte, and acid-base balance   High anion gap metabolic acidosis   Hyponatremia   AKI (acute kidney injury) ([place])   Severe protein-calorie  malnutrition (HCC)   Seizure disorder (HCC)   Bipolar disease, chronic (HCC)  Hypotension: Suspect hypovolemia given nausea vomiting poor p.o. intake.  Also consider adrenal insufficiency -Pressor requirements: Plan Cont stress dose steroids day 2  Cont midodrine  Continue to wean pressors, changing vasopressin to 0.32, change systolic blood pressure goal to greater than 90  Severe aortic regurgitation Plan We will ask cardiology to evaluate  History of UTI.  All cultures negative.  Procalcitonin negative.  Sepsis ruled out.  Procalcitonin negative Plan Discontinue antibiotics  Renal failure: Suspect hypovolemic as above.  Improving with fluids. Plan Cont IVFs  Nausea and vomiting: Chronic.  Shortly after eating.  Suspicious for anatomic lesion. Now with ongoing hypotension adrenal insufficiency considered.  -- CT A/P without etiology Plan Needs esophagram at some point Consider GI consult   Possible adrenal insufficiency Plan F/u MRI brain Stress dose steroids  Fluid and electrolyte imbalance: Hyponatremia: Combination of poor solute intake, hypovolemia. Concern for adrenal insufficiency->slowly improving Plan Cont IVFs Steroids as mentioned   Severe protein calorie malnutrition Plan Attempt diet Nutritional consult  Best Practice (right click and "Reselect all SmartList Selections" daily)   Diet/type: clear liquids DVT prophylaxis: prophylactic heparin  GI prophylaxis: PPI Lines: N/A Foley:  N/A Code Status:  full code Last date of multidisciplinary goals of care discussion [pending]   Critical care time: 32 minutes   CRITICAL CARE Performed by: Excell Seltzer, NP [place] for contact info OR # [phone] if no answer

## 2022-05-04 NOTE — IPAL (Signed)
  Interdisciplinary Goals of Care Family Meeting   Date carried out: 05/04/2022  Location of the meeting: Bedside  Member's involved: Nurse Practitioner and Bedside Registered Nurse  Durable Power of Attorney or acting medical decision maker: the patient     Discussion: We discussed goals of care for Jamie Shields .  Pt has had DNR for "years". She was clear that under no circumstances would she want CPR or intubation/ventilation for any amount of time even if it were a reversible situation   Code status: Full DNR  Disposition: Continue current acute care  Time spent for the meeting: 23 min     Clementeen Graham, NP  05/04/2022, 5:55 PM

## 2022-05-04 NOTE — Progress Notes (Signed)
eLink Physician-Brief Progress Note Patient Name: Jamie Shields DOB: 08-23-1980 MRN: [id number]   Date of Service  05/04/2022  HPI/Events of Note  Lower extremity cyanosis that responded earlier to volume loading but she is + 14 liters on her fluid balance.  eICU Interventions  Wean Levo as tolerated, warm blankets to the lower extremity to promote flow.        Kerry Kass Peretz Thieme 05/04/2022, 10:14 PM

## 2022-05-04 NOTE — Progress Notes (Signed)
  Transition of Care (TOC) Screening Note   Patient Details  Name: [name] Date of Birth: 1980-10-20   Transition of Care [facility]) CM/SW Contact:    Tom-Johnson, Renea Ee, RN Phone Number: 05/04/2022, 2:13 PM  Patient is admitted for Hypovolemic Shock 2/2 severe Weakness, Nausea and Vomiting. Cardiology consulted for severe Aortic Regurgitation. IV abx discontinued. On Levo, Vasopressin. On room air.   Transition of Care Department [facility]) has reviewed patient and no TOC needs or recommendations have been identified at this time. TOC will continue to monitor patient advancement through interdisciplinary progression rounds. If new patient transition needs arise, please place a TOC consult.

## 2022-05-04 NOTE — Progress Notes (Signed)
Cortrak Tube Team Note:  Consult received to place a Cortrak feeding tube.   Attempted tube placement in the left and right nares unsuccessfully. Unable to pass into the esophagus. Attempted x3 and pt unable to continue tolerating. Tube was left in pt's room and discussed with RN. Would recommend tube placement be performed under fluoroscopy.   If the tube becomes dislodged please keep the tube and contact the Cortrak team at [email or web address] for replacement.  If after hours and replacement cannot be delayed, place a NG tube and confirm placement with an abdominal x-ray.    Ranell Patrick, RD, LDN Clinical Dietitian RD pager # available in [place]  After hours/weekend pager # available in [facility]

## 2022-05-04 NOTE — Progress Notes (Signed)
Initial Nutrition Assessment  DOCUMENTATION CODES:   Underweight, Severe malnutrition in context of chronic illness  INTERVENTION:   Once post-pyloric Cortrak tube is placed and placement is confirmed: - Start Osmolite 1.2 @ 20 ml/hr and advance by 10 ml q 12 hours to goal rate of 55 ml/hr (1320 ml/day)  Tube feeding regimen at goal rate provides 1584 kcal, 73 grams of protein, and 1082 ml of H2O.  Monitor magnesium, potassium, and phosphorus BID for at least 3 days, MD to replete as needed, as pt is at risk for refeeding syndrome given severe malnutrition, minimal PO intake x 4 weeks.  - Recommend thiamine 100 mg x 5-7 days due to high refeeding risk  - MVI with minerals daily  - Ensure Enlive po BID, each supplement provides 350 kcal and 20 grams of protein  - Checking vitamin A, vitamin D, vitamin C, vitamin E, vitamin B-12, folate, zinc, and copper labs  NUTRITION DIAGNOSIS:   Severe Malnutrition related to chronic illness (chronic N/V) as evidenced by severe muscle depletion, severe fat depletion.  GOAL:   Patient will meet greater than or equal to 90% of their needs  MONITOR:   PO intake, Supplement acceptance, Labs, Weight trends, TF tolerance, I & O's  REASON FOR ASSESSMENT:   Consult Assessment of nutrition requirement/status, Enteral/tube feeding initiation and management  ASSESSMENT:   41 year old female who presented to the ED on 10/06 with N/V x 4-8 weeks and severe weakness. PMH of IVDU now in remission, chronic bronchitis, seizure disorder, bipolar disorder, anxiety. Pt admitted with hypotension, renal failure, metabolic derangements.  10/06 - clear liquids 10/09 - diet advanced to regular  Discussed pt with RN and during ICU rounds. Discussed pt with CCM NP. Recommend post-pyloric Cortrak placement and initiation of enteral nutrition. CCM NP willing to proceed with Cortrak if pt is amenable.  Spoke with pt at bedside. Pt sleeping soundly on first RD  visit and did not wake up. RD returned later to discuss plan for Cortrak with pt. Pt reports that she is eating and states that she had bacon, eggs, and cottage cheese for breakfast. RD witnessed breakfast meal tray, and pt had only consumed bites of each of these items. Pt reports vomiting after trying to eat. Discussed with RN who reports pt was dry heaving after eating some from her breakfast tray. After further discussion, pt is amenable to Cortrak tube placement and initiation of enteral nutrition. RD will also order Ensure Enlive oral nutrition supplements to maximize PO intake.  Pt not reports feeling very tired and is requesting to sleep. RD unable to obtain further diet and weight history at this time. Per review of notes, pt with very little PO intake for the last 4-8 weeks. Pt also with persistent N/V during this time. Reviewed weight history in chart. Current weight appears stable compared to weight from 05/18/16 (44.7 kg vs 45.4 kg). Overall, pt's weight has trended down slowly since 2016 but weight loss is not clinically significant for timeframe. Pt meets criteria for severe malnutrition based on NFPE.  Pt at very high risk for refeeding syndrome. Recommend thiamine 100 mg for 5-7 days; discussed with Pharmacy. Checking multiple vitamin and mineral labs to assess for deficiencies.  Meal Completion: 20% x 1 clear liquid meal tray on 10/07  Medications reviewed and include: IV solu-cortef, protonix, levophed, vasopressin, IV phenergan PRN  Labs reviewed: sodium 133, BUN 36, creatinine 1.64, hemoglobin 10.0 CBG's: 115-172 x 24 hours  UOP: 825 ml x 24  hours I/O's: +11.7 L since admit  NUTRITION - FOCUSED PHYSICAL EXAM:  Flowsheet Row Most Recent Value  Orbital Region Severe depletion  Upper Arm Region Severe depletion  Thoracic and Lumbar Region Severe depletion  Buccal Region Severe depletion  Temple Region Severe depletion  Clavicle Bone Region Severe depletion  Clavicle and  Acromion Bone Region Severe depletion  Scapular Bone Region Severe depletion  Dorsal Hand Severe depletion  Patellar Region Severe depletion  Anterior Thigh Region Severe depletion  Posterior Calf Region Severe depletion  Edema (RD Assessment) Mild  [BUE]  Hair Reviewed  Eyes Reviewed  Mouth Reviewed  Skin Reviewed  Nails Reviewed       Diet Order:   Diet Order             Diet regular Room service appropriate? Yes; Fluid consistency: Thin  Diet effective now                   EDUCATION NEEDS:   Not appropriate for education at this time  Skin:  Skin Assessment: Reviewed RN Assessment  Last BM:  05/04/22 multiple type 2  Height:   Ht Readings from Last 1 Encounters:  05/01/22 5\' 6"  (1.676 m)    Weight:   Wt Readings from Last 1 Encounters:  05/02/22 44.7 kg    Ideal Body Weight:  59.1 kg  BMI:  Body mass index is 15.91 kg/m.  Estimated Nutritional Needs:   Kcal:  1450-1650  Protein:  65-75 grams  Fluid:  1.4-1.6 L    Gustavus Bryant, MS, RD, LDN Inpatient Clinical Dietitian Please see [facility] for contact information.

## 2022-05-05 DIAGNOSIS — I351 Nonrheumatic aortic (valve) insufficiency: Secondary | ICD-10-CM | POA: Diagnosis not present

## 2022-05-05 DIAGNOSIS — Z66 Do not resuscitate: Secondary | ICD-10-CM

## 2022-05-05 DIAGNOSIS — R931 Abnormal findings on diagnostic imaging of heart and coronary circulation: Secondary | ICD-10-CM

## 2022-05-05 DIAGNOSIS — E2740 Unspecified adrenocortical insufficiency: Secondary | ICD-10-CM

## 2022-05-05 DIAGNOSIS — R571 Hypovolemic shock: Secondary | ICD-10-CM | POA: Diagnosis not present

## 2022-05-05 DIAGNOSIS — Z87898 Personal history of other specified conditions: Secondary | ICD-10-CM

## 2022-05-05 DIAGNOSIS — E872 Acidosis, unspecified: Secondary | ICD-10-CM

## 2022-05-05 LAB — GLUCOSE, CAPILLARY
Glucose-Capillary: 105 mg/dL — ABNORMAL HIGH (ref 70–99)
Glucose-Capillary: 112 mg/dL — ABNORMAL HIGH (ref 70–99)
Glucose-Capillary: 113 mg/dL — ABNORMAL HIGH (ref 70–99)
Glucose-Capillary: 114 mg/dL — ABNORMAL HIGH (ref 70–99)
Glucose-Capillary: 117 mg/dL — ABNORMAL HIGH (ref 70–99)
Glucose-Capillary: 139 mg/dL — ABNORMAL HIGH (ref 70–99)

## 2022-05-05 LAB — BASIC METABOLIC PANEL
Anion gap: 12 (ref 5–15)
BUN: 36 mg/dL — ABNORMAL HIGH (ref 6–20)
CO2: 12 mmol/L — ABNORMAL LOW (ref 22–32)
Calcium: 7.2 mg/dL — ABNORMAL LOW (ref 8.9–10.3)
Chloride: 111 mmol/L (ref 98–111)
Creatinine, Ser: 1.6 mg/dL — ABNORMAL HIGH (ref 0.44–1.00)
GFR, Estimated: 42 mL/min — ABNORMAL LOW (ref >60)
Glucose, Bld: 114 mg/dL — ABNORMAL HIGH (ref 70–99)
Potassium: 3.8 mmol/L (ref 3.5–5.1)
Sodium: 135 mmol/L (ref 135–145)

## 2022-05-05 LAB — VITAMIN D 25 HYDROXY (VIT D DEFICIENCY, FRACTURES): Vit D, 25-Hydroxy: 21.36 ng/mL — ABNORMAL LOW (ref 30–100)

## 2022-05-05 LAB — VITAMIN B12: Vitamin B-12: 584 pg/mL (ref 180–914)

## 2022-05-05 LAB — FOLATE: Folate: 2.3 ng/mL — ABNORMAL LOW (ref >5.9)

## 2022-05-05 MED ORDER — ALUM & MAG HYDROXIDE-SIMETH 200-200-20 MG/5ML PO SUSP
15.0000 mL | Freq: Four times a day (QID) | ORAL | Status: AC | PRN
  Administered 2022-05-05 – 2022-05-08 (×5): 15 mL via ORAL
  Filled 2022-05-05 (×5): qty 30

## 2022-05-05 MED ORDER — SODIUM BICARBONATE 650 MG PO TABS
1300.0000 mg | ORAL_TABLET | Freq: Two times a day (BID) | ORAL | Status: DC
  Administered 2022-05-05 – 2022-05-13 (×17): 1300 mg via ORAL
  Filled 2022-05-05 (×17): qty 2

## 2022-05-05 MED ORDER — PANTOPRAZOLE SODIUM 40 MG PO TBEC
80.0000 mg | DELAYED_RELEASE_TABLET | Freq: Two times a day (BID) | ORAL | Status: DC
  Administered 2022-05-05 – 2022-05-11 (×13): 80 mg via ORAL
  Filled 2022-05-05 (×13): qty 2

## 2022-05-05 MED ORDER — LIDOCAINE HCL URETHRAL/MUCOSAL 2 % EX GEL: 1.0000 | Freq: Once | CUTANEOUS | Status: DC | PRN

## 2022-05-05 MED ORDER — ORAL CARE MOUTH RINSE: 15.0000 mL | OROMUCOSAL | Status: DC | PRN

## 2022-05-05 MED ORDER — POTASSIUM CHLORIDE CRYS ER 20 MEQ PO TBCR
40.0000 meq | EXTENDED_RELEASE_TABLET | Freq: Once | ORAL | Status: AC
  Administered 2022-05-05: 40 meq via ORAL
  Filled 2022-05-05: qty 2

## 2022-05-05 MED ORDER — VITAMIN D 25 MCG (1000 UNIT) PO TABS
1000.0000 [IU] | ORAL_TABLET | Freq: Every day | ORAL | Status: DC
  Administered 2022-05-05 – 2022-05-14 (×10): 1000 [IU] via ORAL
  Filled 2022-05-05 (×10): qty 1

## 2022-05-05 MED ORDER — LACTATED RINGERS IV SOLN: INTRAVENOUS | Status: DC

## 2022-05-05 MED ORDER — FOLIC ACID 1 MG PO TABS
1.0000 mg | ORAL_TABLET | Freq: Every day | ORAL | Status: DC
  Administered 2022-05-05 – 2022-05-14 (×10): 1 mg via ORAL
  Filled 2022-05-05 (×10): qty 1

## 2022-05-05 MED ORDER — MIDODRINE HCL 5 MG PO TABS
15.0000 mg | ORAL_TABLET | Freq: Three times a day (TID) | ORAL | Status: DC
  Administered 2022-05-05 – 2022-05-09 (×9): 15 mg via ORAL
  Filled 2022-05-05 (×12): qty 3

## 2022-05-05 NOTE — Progress Notes (Signed)
eLink Physician-Brief Progress Note Patient Name: Jamie Shields DOB: Jan 01, 1981 MRN: [id number]   Date of Service  05/05/2022  HPI/Events of Note  Patient c/o heart burn, it was relieved by Protonix yesterday but she has no PRN medication ordered,  eICU Interventions  Prn Mylanta ordered,        Joah Patlan U Bellina Tokarczyk 05/05/2022, 3:20 AM

## 2022-05-05 NOTE — Progress Notes (Signed)
NAME:  Jamie Shields, MRN:  [id number], DOB:  1981-04-25, LOS: 4 ADMISSION DATE:  05/01/2022, CONSULTATION DATE:  10/6 REFERRING MD:  Cyndee Brightly, CHIEF COMPLAINT:  shock    History of Present Illness:  41 year old female w/ remote IVDA. Presents to ER 10/6 w/ cc: Severe weakness, nausea and vomiting.  Per the patient she has had ongoing nausea and vomiting for over 4 weeks.  Initially seen by primary care provider about 4 weeks prior at which time was diagnosed with a urinary tract infection and prescribed Cipro.  She has continued to have nausea and vomiting, reports the vomiting occurs almost immediately after eating.  She is essentially been at home over the last several weeks getting weaker and weaker, only able to keep down clear liquids and broth.  She is denied shortness of breath, chest pain, cough.  Has not had any abdominal pain, or diarrhea.  Presented to the emergency room at the insistence of her fianc.  On arrival she was found to be hypotensive with systolic blood pressure in the 80s, Had mild lactic acidosis, 2.2, initial sodium 124, potassium 2.3, with anion gap metabolic acidosis.  Initial white blood cell count was 14.1.  Cultures were sent, she was initiated on IV crystalloid, 30 mL/kg.  Following fluid volume resuscitation still had point of care ultrasound suggesting volume depletion and thus received more IV hydration.  IV antibiotics were initiated, she was started on norepinephrine infusion, and critical care asked to admit.  Pertinent  Medical History  Remote IV drug abuse.  Chronic bronchitis, history of seizure disorder, bipolar disease, anxiety. Significant Hospital Events: Including procedures, antibiotic start and stop dates in addition to other pertinent events   Admitted 10/6 with profound weakness, nausea, vomiting, hypotension.  Started on IV hydration, culture sent, empiric antibiotics initiated 10/7 CT abd/pelvis distended gallbladder but no gallstones or gallbladder  wall thickening.  Small left pleural effusion anasarca.  Central line placed 10/8 CT head negative stress dose steroids started still on pressors, midodrine initiated.  Echocardiogram obtained EF 70 to 75% hyperdynamic diastolic parameters normal no evidence of valvular vegetation.  The aortic valve showed severe regurgitation 10/9 blood pressure goal changed to systolic blood pressure greater than 90 added PPI for ongoing "heartburn". She has requested DNR status  10/10 SBP goal > 85. Inc'd midodrine to 15 tid, stopped vasopressin. Added back low MIVFs. Getting OOB.   Interim History / Subjective:  No distress.   Objective   Blood pressure (Abnormal) 85/51, pulse 83, temperature 97.9 F (36.6 C), temperature source Axillary, resp. rate 14, height 5\' 6"  (1.676 m), weight 51.6 kg, SpO2 (Abnormal) 87 %.        Intake/Output Summary (Last 24 hours) at 05/05/2022 0726 Last data filed at 05/05/2022 0600 Gross per 24 hour  Intake 2731.77 ml  Output 775 ml  Net 1956.77 ml  Net IO Since Admission: 13,847.83 mL [05/05/22 0726]  Filed Weights   05/01/22 1611 05/02/22 0500 05/05/22 0500  Weight: 44.5 kg 44.7 kg 51.6 kg    Examination: General resting in bed no acute distress HEENT normocephalic atraumatic no jugular venous distention Pulmonary clear to auscultation Cardiac regular rate and rhythm Abdomen soft nontender Extremities warm dry diffuse anasarca Neuro intact  Resolved Hospital Problem list   Anion gap metabolic acidosis: Multifactorial due to uremia, renal failure, lactic acidosis, concern for starvation ketosis.  BHCG positive: Likely reflective of hypovolemia or hemoconcentration. Repeat HCG negative after hydration. History of UTI.  All cultures  negative.  Procalcitonin negative.  Sepsis ruled out Assessment & Plan:  Principal Problem:   Hypovolemic shock (HCC) Active Problems:   Severe dehydration   Nausea and vomiting   Disorders of fluid, electrolyte, and acid-base  balance   Hyponatremia   AKI (acute kidney injury) (HCC)   Severe protein-calorie malnutrition (HCC)   Seizure disorder (HCC)   Bipolar disease, chronic (HCC)   Normal anion gap metabolic acidosis   DNR (do not resuscitate)   Adrenal insufficiency ([place])   History of seizures  Hypotension: Suspect hypovolemia given nausea vomiting poor p.o. intake.  Also consider adrenal insufficiency -Pressor requirements: improved.  Plan Stress dose steroids day 3 Midodrine Norepi for SBP > 90   Severe aortic regurgitation Plan Cards eval    Renal failure: Suspect hypovolemic as above.  Improving with fluids Plan Cont MIVFs SBP goal > 90  Am chem   Nausea and vomiting: Chronic.  Shortly after eating.  Suspicious for anatomic lesion. Now with ongoing hypotension adrenal insufficiency considered.  -- CT A/P without etiology Plan Esophagram and GI consult when stable    Possible adrenal insufficiency Plan Day 3 stress dose steroids MRI brain when off pressors.   Fluid and electrolyte imbalance: Hyponatremia, NAGMA Combination of poor solute intake, hypovolemia. Concern for adrenal insufficiency->slowly improving Plan Cont IVFs Day 3 stress dose steroids Add bicarb replacement  Serial chems to ensure no hypokalemia    Severe protein calorie malnutrition ->tried post-pyloric; could not place. Doubt she would want PEG.  Plan RD following F/u vits studies  Will need GI eval once she is more stable  H/o seizure d/o Plan Cont AEDs  H/o bipolar disease Plan Cont Buspar   Best Practice (right click and "Reselect all SmartList Selections" daily)   Diet/type: Regular consistency (see orders) DVT prophylaxis: prophylactic heparin  GI prophylaxis: PPI Lines: N/A Foley:  N/A Code Status:  full code Last date of multidisciplinary goals of care discussion [pending]   Critical care time: 32 minutes   CRITICAL CARE Performed by: Excell Seltzer, NP [place] for contact info OR # [phone] if no answer

## 2022-05-05 NOTE — Consult Note (Signed)
Cardiology Consultation   Patient ID: LARISSA PEGG MRN: [id number]; DOB: 06/25/1981  Admit date: 05/01/2022 Date of Consult: 05/05/2022  PCP:  Fleet Contras, MD   [facility] Providers Cardiologist:  N/A  Patient Profile:   Jamie Shields is a 41 y.o. female with a hx of seizure disorder and bipolar disorder 1 who is being seen 05/05/2022 for the evaluation of aortic regurgitation at the request of Dr. Craige Cotta.   History of Present Illness:   Jamie Shields was treated with abx for UTI but unable to keep down medications.  Since then, patient had persistent nausea and vomiting.  Was getting weaker and weaker and lost about 30 pounds.  She eventually came to ER few days ago for further evaluation and found to be hypotensive with systolic blood pressure in 80s with mild lactic acid elevation.  He is treated with IV crystalloids >> volume resuscitation and norepinephrine infusion.  Admitted under critical care team.  Blood pressure improving.  Echocardiogram showing possible severe aortic regurgitation and cardiology consulted.  Patient denies prior cardiac history. She was never told to have heart murmur or rheumatic fever.  Denies history of IV drug use but listed on H&P.  Prior tobacco smoking, quit in 2019.  Denies syncope, chest pain, shortness of breath, orthopnea or PND.  She may have rare palpitation lasting for few seconds.  History of CAD to father, mother, paternal grandfather and maternal grandfather.   Past Medical History:  Diagnosis Date   Anxiety    Bipolar 1 disorder (HCC)    Bursitis of hip    Chronic bronchitis    Hip joint pain    Seizures (HCC)     Past Surgical History:  Procedure Laterality Date   CESAREAN SECTION     2002   KNEE ARTHROSCOPY     MOUTH SURGERY  2010     Inpatient Medications: Scheduled Meds:  busPIRone  10 mg Oral BID   Chlorhexidine Gluconate Cloth  6 each Topical Daily   cholecalciferol  1,000 Units Oral Daily   feeding  supplement  237 mL Oral BID BM   folic acid  1 mg Oral Daily   heparin  5,000 Units Subcutaneous Q8H   hydrocortisone sod succinate (SOLU-CORTEF) inj  100 mg Intravenous Q8H   levETIRAcetam  500 mg Oral BID   midodrine  15 mg Oral TID WC   multivitamin with minerals  1 tablet Oral Daily   pantoprazole  80 mg Oral BID   sodium bicarbonate  1,300 mg Oral BID   thiamine  100 mg Oral Daily   Continuous Infusions:  sodium chloride 10 mL/hr at 05/05/22 1300   lactated ringers 75 mL/hr at 05/05/22 1300   norepinephrine (LEVOPHED) Adult infusion 12 mcg/min (05/05/22 1300)   promethazine (PHENERGAN) injection (IM or IVPB) Stopped (05/04/22 1400)   PRN Meds: acetaminophen (TYLENOL) oral liquid 160 mg/5 mL, ALPRAZolam, alum & mag hydroxide-simeth, docusate sodium, lidocaine, melatonin, mouth rinse, polyethylene glycol, promethazine (PHENERGAN) injection (IM or IVPB)  Allergies:    Allergies  Allergen Reactions   Cucumber Extract Anaphylaxis   Keflex [Cephalexin] Nausea And Vomiting   Macrobid [Nitrofurantoin Monohydrate Macrocrystals] Hives and Swelling   Naproxen Nausea And Vomiting   Penicillins Nausea And Vomiting    Heavy vomitting Did PCN reaction causing immediate rash, facial/tongue/throat swelling, SOB or lightheadedness with hypotension: No swelling, but im not sure if i got a rash as i was red all over already Did PCN reaction causing severe  rash involving mucus membranes or skin necrosis: No Did a PCN reaction that required hospitalization Yes went to the ED Has patient had a PCN reaction occurring within the last 10 years: No-childhood allergy If all of the above answers are "NO", then may proceed with   Tramadol Nausea And Vomiting   Watermelon Concentrate Diarrhea   Darvocet [Propoxyphene N-Acetaminophen] Nausea And Vomiting and Other (See Comments)    migraines   Flagyl [Metronidazole Hcl] Hives, Swelling and Rash   Ketorolac Nausea And Vomiting and Other (See Comments)     migraines   Ultram [Tramadol Hcl] Nausea And Vomiting and Other (See Comments)    migraines   Vicodin [Hydrocodone-Acetaminophen] Nausea And Vomiting and Other (See Comments)    migraines    Social History:   Social History   Socioeconomic History   Marital status: Legally Separated    Spouse name: Not on file   Number of children: Not on file   Years of education: Not on file   Highest education level: Not on file  Occupational History   Not on file  Tobacco Use   Smoking status: Every Day    Packs/day: 0.50    Types: Cigarettes   Smokeless tobacco: Never  Substance and Sexual Activity   Alcohol use: Yes    Comment: rarely   Drug use: No   Sexual activity: Never    Birth control/protection: None  Other Topics Concern   Not on file  Social History Narrative   Not on file   Social Determinants of Health   Financial Resource Strain: Not on file  Food Insecurity: Not on file  Transportation Needs: Not on file  Physical Activity: Not on file  Stress: Not on file  Social Connections: Not on file  Intimate Partner Violence: Not on file    Family History:    Family History  Problem Relation Age of Onset   Hypertension Mother    Diabetes Other    Cancer Other      ROS:  Please see the history of present illness.  All other ROS reviewed and negative.     Physical Exam/Data:   Vitals:   05/05/22 1100 05/05/22 1113 05/05/22 1200 05/05/22 1300  BP: (!) 89/51  (!) 102/59 (!) 107/50  Pulse:      Resp: 14  17 18   Temp:  98.1 F (36.7 C)    TempSrc:  Oral    SpO2:      Weight:      Height:        Intake/Output Summary (Last 24 hours) at 05/05/2022 1356 Last data filed at 05/05/2022 1300 Gross per 24 hour  Intake 3118.76 ml  Output 775 ml  Net 2343.76 ml      05/05/2022    5:00 AM 05/02/2022    5:00 AM 05/01/2022    4:11 PM  Last 3 Weights  Weight (lbs) 113 lb 12.1 oz 98 lb 8.7 oz 98 lb  Weight (kg) 51.6 kg 44.7 kg 44.453 kg     Body mass index is  18.36 kg/m.  General:  Thin frail ill appearing in no acute distress HEENT: normal Neck: no JVD Vascular: No carotid bruits; Distal pulses 2+ bilaterally Cardiac:  normal S1, S2; RRR; no murmur  Lungs:  clear to auscultation bilaterally, no wheezing, rhonchi or rales  Abd: soft, nontender, no hepatomegaly  Ext: no edema Musculoskeletal:  No deformities, BUE and BLE strength normal and equal Skin: warm and dry  Neuro:  CNs  2-12 intact, no focal abnormalities noted Psych:  Normal affect   EKG:  The EKG was personally reviewed and demonstrates:  Sinus rhythm, PACs Telemetry:  Telemetry was personally reviewed and demonstrates:  Sinus rhythm  Relevant CV Studies:  Echo 05/03/2022 1. Left ventricular ejection fraction, by estimation, is 70 to 75%. The  left ventricle has hyperdynamic function. The left ventricle has no  regional wall motion abnormalities. Left ventricular diastolic parameters  were normal.   2. Right ventricular systolic function is hyperdynamic. The right  ventricular size is normal. There is normal pulmonary artery systolic  pressure. The estimated right ventricular systolic pressure is 28.2 mmHg.   3. Large pleural effusion in the left lateral region.   4. The mitral valve is normal in structure. No evidence of mitral valve  regurgitation.   5. Suboptimally seen, but suspicious for severe aortic insufficiency. The  aortic valve was not well visualized. Aortic valve regurgitation is  severe. No aortic stenosis is present. Aortic regurgitation PHT measures  200 msec.   Conclusion(s)/Recommendation(s): No evidence of valvular vegetations on  this transthoracic echocardiogram. Aortic valve images are suboptimal, but  raise concern for severe aortic insufficiency. Consider a transesophageal  echocardiogram to exclude  infective endocarditis if clinically indicated.   Laboratory Data:  High Sensitivity Troponin:  No results for input(s): "TROPONINIHS" in the last  720 hours.   Chemistry Recent Labs  Lab 05/01/22 1608 05/01/22 1614 05/02/22 0406 05/02/22 0604 05/03/22 0432 05/04/22 0455 05/05/22 0451  NA 124*   < > 128*   < > 128* 133* 135  K 2.3*   < > 2.6*   < > 3.1* 3.9 3.8  CL 87*   < > 103   < > 101 107 111  CO2 17*   < > 14*   < > 15* 11* 12*  GLUCOSE 129*   < > 112*   < > 113* 170* 114*  BUN 151*   < > 92*   < > 53* 36* 36*  CREATININE 5.16*   < > 2.93*   < > 2.00* 1.64* 1.60*  CALCIUM 8.2*   < > 6.8*   < > 7.5* 7.7* 7.2*  MG 3.2*  --  2.6*  --   --   --   --   GFRNONAA 10*   < > 20*   < > 32* 40* 42*  ANIONGAP 20*   < > 11   < > 12 15 12    < > = values in this interval not displayed.    Recent Labs  Lab 05/01/22 1608  PROT 6.2*  ALBUMIN 3.1*  AST 19  ALT 10  ALKPHOS 83  BILITOT 0.7   Lipids No results for input(s): "CHOL", "TRIG", "HDL", "LABVLDL", "LDLCALC", "CHOLHDL" in the last 168 hours.  Hematology Recent Labs  Lab 05/02/22 0604 05/03/22 0917 05/04/22 0455  WBC 11.4* 13.5* 8.6  RBC 2.94* 3.02* 3.05*  HGB 9.7* 10.0* 10.0*  HCT 26.0* 27.4* 28.1*  MCV 88.4 90.7 92.1  MCH 33.0 33.1 32.8  MCHC 37.3* 36.5* 35.6  RDW 15.2 16.2* 17.1*  PLT 410* 443* 474*   Thyroid  Recent Labs  Lab 05/01/22 1923  TSH 1.258    BNPNo results for input(s): "BNP", "PROBNP" in the last 168 hours.  DDimer No results for input(s): "DDIMER" in the last 168 hours.   Radiology/Studies:  CT HEAD WO CONTRAST (07/01/22)  Result Date: 05/03/2022 CLINICAL DATA:  Nausea and vomiting. EXAM: CT HEAD WITHOUT  CONTRAST TECHNIQUE: Contiguous axial images were obtained from the base of the skull through the vertex without intravenous contrast. RADIATION DOSE REDUCTION: This exam was performed according to the departmental dose-optimization program which includes automated exposure control, adjustment of the mA and/or kV according to patient size and/or use of iterative reconstruction technique. COMPARISON:  Maxillofacial CT 10/21/2012 FINDINGS: Brain:  No acute infarct, hemorrhage, or mass lesion is present. No significant white matter lesions are present. The ventricles are of normal size. No significant extraaxial fluid collection is present. The brainstem and cerebellum are within normal limits. Pituitary is of normal size. Vascular: No hyperdense vessel or unexpected calcification. Skull: Calvarium is intact. No focal lytic or blastic lesions are present. No significant extracranial soft tissue lesion is present. Sinuses/Orbits: The paranasal sinuses and mastoid air cells are clear. The globes and orbits are within normal limits. IMPRESSION: Negative CT of the head. Electronically Signed   By: Marin Roberts M.D.   On: 05/03/2022 15:12   ECHOCARDIOGRAM COMPLETE  Result Date: 05/03/2022    ECHOCARDIOGRAM REPORT   Patient Name:   MEREDETH MELLEMA Date of Exam: 05/03/2022 Medical Rec #:  [id number]      Height:       66.0 in Accession #:    [id number]     Weight:       98.5 lb Date of Birth:  1981-06-07       BSA:          1.480 m Patient Age:    41 years       BP:           114/56 mmHg Patient Gender: F              HR:           109 bpm. Exam Location:  Inpatient Procedure: 2D Echo, Color Doppler and Cardiac Doppler STAT ECHO Indications:    Shock (HCC) [[id number]]  History:        Patient has no prior history of Echocardiogram examinations.  Sonographer:    Leta Jungling RDCS Referring Phys: Karren Burly  Sonographer Comments: Image acquisition challenging due to patient body habitus. IMPRESSIONS  1. Left ventricular ejection fraction, by estimation, is 70 to 75%. The left ventricle has hyperdynamic function. The left ventricle has no regional wall motion abnormalities. Left ventricular diastolic parameters were normal.  2. Right ventricular systolic function is hyperdynamic. The right ventricular size is normal. There is normal pulmonary artery systolic pressure. The estimated right ventricular systolic pressure is 28.2 mmHg.  3. Large pleural  effusion in the left lateral region.  4. The mitral valve is normal in structure. No evidence of mitral valve regurgitation.  5. Suboptimally seen, but suspicious for severe aortic insufficiency. The aortic valve was not well visualized. Aortic valve regurgitation is severe. No aortic stenosis is present. Aortic regurgitation PHT measures 200 msec. Conclusion(s)/Recommendation(s): No evidence of valvular vegetations on this transthoracic echocardiogram. Aortic valve images are suboptimal, but raise concern for severe aortic insufficiency. Consider a transesophageal echocardiogram to exclude infective endocarditis if clinically indicated. FINDINGS  Left Ventricle: Left ventricular ejection fraction, by estimation, is 70 to 75%. The left ventricle has hyperdynamic function. The left ventricle has no regional wall motion abnormalities. The left ventricular internal cavity size was normal in size. There is no left ventricular hypertrophy. Left ventricular diastolic parameters were normal. Normal left ventricular filling pressure. Right Ventricle: The right ventricular size is normal. No increase in right ventricular wall thickness. Right  ventricular systolic function is hyperdynamic. There is normal pulmonary artery systolic pressure. The tricuspid regurgitant velocity is 2.25 m/s, and with an assumed right atrial pressure of 8 mmHg, the estimated right ventricular systolic pressure is 67.6 mmHg. Left Atrium: Left atrial size was normal in size. Right Atrium: Right atrial size was normal in size. Pericardium: There is no evidence of pericardial effusion. Mitral Valve: The mitral valve is normal in structure. No evidence of mitral valve regurgitation. Tricuspid Valve: The tricuspid valve is normal in structure. Tricuspid valve regurgitation is mild. Aortic Valve: Suboptimally seen, but suspicious for severe aortic insufficiency. The aortic valve was not well visualized. Aortic valve regurgitation is severe. Aortic  regurgitation PHT measures 200 msec. No aortic stenosis is present. Pulmonic Valve: The pulmonic valve was not well visualized. Pulmonic valve regurgitation is not visualized. Aorta: The aortic root is normal in size and structure. IAS/Shunts: The interatrial septum was not well visualized. Additional Comments: There is a large pleural effusion in the left lateral region.  LEFT VENTRICLE PLAX 2D LVIDd:         4.50 cm   Diastology LVIDs:         2.30 cm   LV e' medial:    9.79 cm/s LV PW:         0.50 cm   LV E/e' medial:  9.8 LV IVS:        0.40 cm   LV e' lateral:   8.81 cm/s LVOT diam:     1.90 cm   LV E/e' lateral: 10.9 LV SV:         49 LV SV Index:   33 LVOT Area:     2.84 cm  RIGHT VENTRICLE RV S prime:     19.00 cm/s TAPSE (M-mode): 2.8 cm LEFT ATRIUM           Index        RIGHT ATRIUM          Index LA diam:      2.30 cm 1.55 cm/m   RA Area:     7.18 cm LA Vol (A2C): 19.9 ml 13.44 ml/m  RA Volume:   11.90 ml 8.04 ml/m LA Vol (A4C): 19.1 ml 12.90 ml/m  AORTIC VALVE LVOT Vmax:   106.00 cm/s LVOT Vmean:  57.000 cm/s LVOT VTI:    0.174 m AI PHT:      200 msec  AORTA Ao Root diam: 3.10 cm MITRAL VALVE               TRICUSPID VALVE MV Area (PHT): 4.21 cm    TR Peak grad:   20.2 mmHg MV Decel Time: 180 msec    TR Vmax:        225.00 cm/s MV E velocity: 96.40 cm/s MV A velocity: 54.00 cm/s  SHUNTS MV E/A ratio:  1.79        Systemic VTI:  0.17 m                            Systemic Diam: 1.90 cm Dani Gobble Croitoru MD Electronically signed by Sanda Klein MD Signature Date/Time: 05/03/2022/12:16:20 PM    Final    CT ABDOMEN PELVIS WO CONTRAST  Result Date: 05/02/2022 CLINICAL DATA:  Nausea and vomiting EXAM: CT ABDOMEN AND PELVIS WITHOUT CONTRAST TECHNIQUE: Multidetector CT imaging of the abdomen and pelvis was performed following the standard protocol without IV contrast. RADIATION DOSE REDUCTION: This exam was performed according  to the departmental dose-optimization program which includes automated  exposure control, adjustment of the mA and/or kV according to patient size and/or use of iterative reconstruction technique. COMPARISON:  None Available. FINDINGS: Motion artifact and attenuation artifact from arm positioning degrades quality of images. Lower chest: Small left pleural effusion with adjacent atelectasis. Normal heart size no pericardial effusion. Hepatobiliary: Normal liver contour no focal hepatic lesions. No biliary ductal dilation. Distended gallbladder. No gallbladder wall thickening or radiopaque gallstones. Pancreas: Unremarkable. No pancreatic ductal dilatation or surrounding inflammatory changes. Spleen: Normal in size without focal abnormality. Adrenals/Urinary Tract: Adrenal glands are unremarkable. No hydronephrosis bladder is distended but otherwise unremarkable. Stomach/Bowel: Stomach is within normal limits. Appendix is not visualized. No evidence of bowel wall thickening or distention. Mild right greater than left paracolic gutter fat stranding. Vascular/Lymphatic: Aortic atherosclerosis. No enlarged abdominal or pelvic lymph nodes. Reproductive: Uterus and bilateral adnexa are unremarkable. Other: No abdominal wall hernia or abnormality. Diffuse soft tissue anasarca. Small volume free fluid in the pelvis. Musculoskeletal: No acute or significant osseous findings. IMPRESSION: 1. Overall limited exam due to motion artifact and attenuation artifact due to arm positioning. 2. Distended gallbladder but no radiopaque gallstones or gallbladder wall thickening. Correlate with right upper quadrant pain and if there is clinical concern for acute cholecystitis, consider further assessment with gallbladder ultrasound. 3. Sequela of volume overload including small left pleural effusion, pericolic gutter fat stranding, and anasarca. Electronically Signed   By: Jacob Moores M.D.   On: 05/02/2022 13:19   DG CHEST PORT 1 VIEW  Result Date: 05/02/2022 CLINICAL DATA:  Central line placement  EXAM: PORTABLE CHEST 1 VIEW COMPARISON:  05/01/2022 FINDINGS: Lungs are clear.  No pleural effusion or pneumothorax. The heart is normal in size. Right IJ venous catheter terminates at the cavoatrial junction. IMPRESSION: Right IJ venous catheter terminates at the cavoatrial junction. No pneumothorax. Electronically Signed   By: Charline Bills M.D.   On: 05/02/2022 03:22   DG Chest Port 1 View  Result Date: 05/01/2022 CLINICAL DATA:  Sepsis. EXAM: PORTABLE CHEST 1 VIEW COMPARISON:  October 01, 2017. FINDINGS: The heart size and mediastinal contours are within normal limits. Both lungs are clear. Hyperexpansion of the lungs is noted. The visualized skeletal structures are unremarkable. IMPRESSION: No acute cardiopulmonary abnormality seen. Hyperexpansion of the lungs. Electronically Signed   By: Lupita Raider M.D.   On: 05/01/2022 16:31     Assessment and Plan:   Aortic insufficiency Listed as concern for severe aortic insufficiency in setting of suboptimal imaging.  However reviewed by Dr. Cristal Deer seems like mild.  Patient denies prior history of IV drug use or rheumatic fever.  No syncope.  No cardiac issue while growing up.   2. Hypovolemic shock Likely due to poor po.     For questions or updates, please contact [facility] Please consult [email or web address] for contact info under    Lorelei Pont, PA  05/05/2022 1:56 PM

## 2022-05-05 NOTE — Progress Notes (Signed)
Afternoon rounds  She is s/p cards consult. Appreciate their input. Seems like AR mod and no intervention warranted. She is fairly upset as in hx it reports she had h/o IVDA. This was also from my HX was communicated but was not able to verify by pt. After discussing w/ patient she adamantly denies any h/o IVC drug abuse.  Plan I will remove this from future notes.   Erick Colace ACNP-BC [place] Pager # [phone] OR # [phone] if no answer

## 2022-05-05 NOTE — Progress Notes (Signed)
Nutrition Follow-up  DOCUMENTATION CODES:   Underweight, Severe malnutrition in context of chronic illness  INTERVENTION:   Recommend monitoring magnesium, potassium, and phosphorus BID for at least 3 days, MD to replete as needed, as pt is at risk for refeeding syndrome given severe malnutrition, minimal PO intake x 4 weeks.   - Recommend thiamine 100 mg x 5-7 days due to high refeeding risk   - MVI with minerals daily  - Cholecalciferol 1000 units daily for vitamin D deficiency  - Folic acid 1 mg daily for folate deficiency   - Continue Ensure Enlive po BID, each supplement provides 350 kcal and 20 grams of protein   - Awaiting results of vitamin A, vitamin E, vitamin C, zinc, and copper labs  NUTRITION DIAGNOSIS:   Severe Malnutrition related to chronic illness (chronic N/V) as evidenced by severe muscle depletion, severe fat depletion.  Ongoing, being addressed via oral nutrition supplements  GOAL:   Patient will meet greater than or equal to 90% of their needs  Progressing  MONITOR:   PO intake, Supplement acceptance, Labs, Weight trends, TF tolerance, I & O's  REASON FOR ASSESSMENT:   Consult Assessment of nutrition requirement/status, Enteral/tube feeding initiation and management  ASSESSMENT:   41 year old female who presented to the ED on 10/06 with N/V x 4-8 weeks and severe weakness. PMH of IVDU now in remission, chronic bronchitis, seizure disorder, bipolar disorder, anxiety. Pt admitted with hypotension, renal failure, metabolic derangements.  10/06 - clear liquids 10/09 - diet advanced to regular, Cortrak tube placement attempted but unsuccessful  Discussed pt with RN and during ICU rounds. Noted plan for esophagram and GI consult when pt is stable. Cortrak tube unable to be placed yesterday.  Attempted to speak with pt x 2 but pt in with other providers. Discussed with RN. Per RN, pt with some acid reflux symptoms but no N/V. Pt tolerating PO intake  but only in small amounts. Pt drinking some of supplements but not all of them due to early satiety.  Noted folate and vitamin D deficiency. Discussed with CCM. Will order supplementation.  Admit weight: 44.7 kg Current weight: 51.6 kg  Meal Completion: 0-40%  Medications reviewed and include: Ensure Enlive BID, IV solu-cortef, MVI with minerals, protonix, sodium bicarb 1300 mg BID, thiamine 100 mg daily, levophed IVF: LR @ 75 ml/hr  Vitamin/Mineral Profile: Vitamin B12: 584 (WNL) Folate B9: 2.3 (low) Vitamin A: pending Vitamin D: 21.36 (low) Vitamin C: pending Vitamin E: pending Copper: pending Zinc: pending  Labs reviewed: BUN 36, creatinine 1.60 CBG's: 74-139 x 24 hours  UOP: 775 ml + 3 unmeasured occurrences x 24 hours I/O's: +14.2 L since admit  Diet Order:   Diet Order             Diet regular Room service appropriate? Yes; Fluid consistency: Thin  Diet effective now                   EDUCATION NEEDS:   Not appropriate for education at this time  Skin:  Skin Assessment: Reviewed RN Assessment  Last BM:  05/05/22  Height:   Ht Readings from Last 1 Encounters:  05/01/22 5\' 6"  (1.676 m)    Weight:   Wt Readings from Last 1 Encounters:  05/05/22 51.6 kg    Ideal Body Weight:  59.1 kg  BMI:  Body mass index is 18.36 kg/m.  Estimated Nutritional Needs:   Kcal:  1450-1650  Protein:  65-75 grams  Fluid:  1.4-1.6  L    Gustavus Bryant, MS, RD, LDN Inpatient Clinical Dietitian Please see AMiON for contact information.

## 2022-05-06 ENCOUNTER — Encounter (HOSPITAL_COMMUNITY): Payer: Self-pay | Admitting: Pulmonary Disease

## 2022-05-06 DIAGNOSIS — Z66 Do not resuscitate: Secondary | ICD-10-CM

## 2022-05-06 DIAGNOSIS — I9589 Other hypotension: Secondary | ICD-10-CM

## 2022-05-06 DIAGNOSIS — E861 Hypovolemia: Secondary | ICD-10-CM

## 2022-05-06 DIAGNOSIS — E2740 Unspecified adrenocortical insufficiency: Secondary | ICD-10-CM | POA: Diagnosis not present

## 2022-05-06 DIAGNOSIS — N179 Acute kidney failure, unspecified: Secondary | ICD-10-CM | POA: Diagnosis not present

## 2022-05-06 DIAGNOSIS — R571 Hypovolemic shock: Secondary | ICD-10-CM | POA: Diagnosis not present

## 2022-05-06 DIAGNOSIS — R634 Abnormal weight loss: Secondary | ICD-10-CM

## 2022-05-06 DIAGNOSIS — F319 Bipolar disorder, unspecified: Secondary | ICD-10-CM

## 2022-05-06 LAB — TROPONIN I (HIGH SENSITIVITY): Troponin I (High Sensitivity): 11 ng/L (ref <18)

## 2022-05-06 LAB — COMPREHENSIVE METABOLIC PANEL
ALT: 124 U/L — ABNORMAL HIGH (ref 0–44)
AST: 220 U/L — ABNORMAL HIGH (ref 15–41)
Albumin: 2.5 g/dL — ABNORMAL LOW (ref 3.5–5.0)
Alkaline Phosphatase: 178 U/L — ABNORMAL HIGH (ref 38–126)
Anion gap: 7 (ref 5–15)
BUN: 33 mg/dL — ABNORMAL HIGH (ref 6–20)
CO2: 16 mmol/L — ABNORMAL LOW (ref 22–32)
Calcium: 7.3 mg/dL — ABNORMAL LOW (ref 8.9–10.3)
Chloride: 113 mmol/L — ABNORMAL HIGH (ref 98–111)
Creatinine, Ser: 1.53 mg/dL — ABNORMAL HIGH (ref 0.44–1.00)
GFR, Estimated: 44 mL/min — ABNORMAL LOW (ref >60)
Glucose, Bld: 108 mg/dL — ABNORMAL HIGH (ref 70–99)
Potassium: 4.1 mmol/L (ref 3.5–5.1)
Sodium: 136 mmol/L (ref 135–145)
Total Bilirubin: 0.5 mg/dL (ref 0.3–1.2)
Total Protein: 4.7 g/dL — ABNORMAL LOW (ref 6.5–8.1)

## 2022-05-06 LAB — CULTURE, BLOOD (ROUTINE X 2)
Culture: NO GROWTH
Culture: NO GROWTH

## 2022-05-06 LAB — GLUCOSE, CAPILLARY
Glucose-Capillary: 106 mg/dL — ABNORMAL HIGH (ref 70–99)
Glucose-Capillary: 98 mg/dL (ref 70–99)
Glucose-Capillary: 94 mg/dL (ref 70–99)
Glucose-Capillary: 93 mg/dL (ref 70–99)
Glucose-Capillary: 105 mg/dL — ABNORMAL HIGH (ref 70–99)

## 2022-05-06 LAB — LACTIC ACID, PLASMA
Lactic Acid, Venous: 2.7 mmol/L (ref 0.5–1.9)
Lactic Acid, Venous: 1.7 mmol/L (ref 0.5–1.9)

## 2022-05-06 LAB — MAGNESIUM: Magnesium: 1.7 mg/dL (ref 1.7–2.4)

## 2022-05-06 MED ORDER — ONDANSETRON HCL 4 MG PO TABS
4.0000 mg | ORAL_TABLET | Freq: Three times a day (TID) | ORAL | Status: DC | PRN
  Administered 2022-05-06 – 2022-05-09 (×4): 4 mg via ORAL
  Filled 2022-05-06 (×4): qty 1

## 2022-05-06 MED ORDER — MORPHINE SULFATE (PF) 2 MG/ML IV SOLN
1.0000 mg | Freq: Once | INTRAVENOUS | Status: AC
  Administered 2022-05-06: 1 mg via INTRAVENOUS
  Filled 2022-05-06: qty 1

## 2022-05-06 MED ORDER — PROMETHAZINE HCL 25 MG PO TABS
25.0000 mg | ORAL_TABLET | Freq: Four times a day (QID) | ORAL | Status: DC | PRN
  Administered 2022-05-06: 25 mg via ORAL
  Filled 2022-05-06: qty 1
  Filled 2022-05-06: qty 3
  Filled 2022-05-06 (×2): qty 1

## 2022-05-06 MED ORDER — ALPRAZOLAM 0.25 MG PO TABS
0.2500 mg | ORAL_TABLET | Freq: Two times a day (BID) | ORAL | Status: DC | PRN
  Administered 2022-05-08: 0.25 mg via ORAL
  Filled 2022-05-06: qty 1

## 2022-05-06 MED ORDER — ACETAMINOPHEN 325 MG PO TABS
650.0000 mg | ORAL_TABLET | Freq: Four times a day (QID) | ORAL | Status: DC | PRN
  Administered 2022-05-07 – 2022-05-14 (×5): 650 mg via ORAL
  Filled 2022-05-06 (×5): qty 2

## 2022-05-06 MED ORDER — DIAZEPAM 5 MG PO TABS
5.0000 mg | ORAL_TABLET | Freq: Once | ORAL | Status: AC
  Administered 2022-05-06: 5 mg via ORAL
  Filled 2022-05-06: qty 1

## 2022-05-06 MED ORDER — MORPHINE SULFATE (PF) 2 MG/ML IV SOLN
2.0000 mg | INTRAVENOUS | Status: DC | PRN
  Administered 2022-05-06 – 2022-05-14 (×18): 2 mg via INTRAVENOUS
  Filled 2022-05-06 (×19): qty 1

## 2022-05-06 MED ORDER — LACTATED RINGERS IV BOLUS
500.0000 mL | Freq: Once | INTRAVENOUS | Status: AC
  Administered 2022-05-06: 500 mL via INTRAVENOUS
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
## 2022-05-06 NOTE — H&P (View-Only) (Signed)
                                                                           [facility] Consult: 11:03 AM 05/06/2022  LOS: 5 days    Referring Provider: Dr V Sood, CCM  Primary Care Physician:  Avbuere, Edwin, MD Primary Gastroenterologist:  unassigned     Reason for Consultation: Nausea, vomiting   HPI: Jamie Shields is a 41 y.o. female.  PMH bipolar disorder, anxiety.  Seizures.  Chronic bronchitis.  Remote IV drug abuse.  Surgeries include C-section, knee arthroscopy, oral surgery.  Decades long history of intermittent bouts with nausea and vomiting.  Had an EGD in [place] 30 years ago, it showed an ulcer.  The only med she uses at home is Rolaids, no PPI or H2 blocker.  At home not currently taking PPI or H2 blocker.  PCP recently restarted her ibuprofen 800 mg daily but she is not sure why, perhaps for her headaches. For several weeks she has had nonbloody nausea and vomiting sometimes just more of a regurgitation.  It mostly occurs with p.o. intake even water but can occur on an empty stomach.  No significant abdominal pain.  No diarrhea. Pt also reports that she has always been on the thin side, previous baseline weight of 115 pounds.  Currently measures 117 pounds.  Hospital day 5.  Presented with profound weakness, 4 weeks nausea and vomiting.  About 4 weeks prior diagnosed by PCP with UTI, treated with Cipro.  Nausea and vomiting continued, especially associated postprandial, and she stated home becoming progressively weaker.  Her fianc insisted she come to the emergency room Arrival she was hyponatremic at 124, hypokalemic 2.3, mild lactic acidosis.  Systolic BPs in the 80s.  Antibiotics initiated along with multiple pressors.  Received infusion of albumin 3 days ago.  05/02/2022 CTAP showed gallbladder distention without stones or wall thickening, anasarca, left pleural  effusion. 10/8 CT head negative. 10/8 echocardiogram with LVEF 70 to 75%, no vegetations, severe aortic valve regurgitation.  Current labs with lactic acid 2.1.  Hb 10, MCV 92.  Platelets 474.  WBCs normal.  Folate low 2.3.  Vitamin D low.  Vitamin B12 okay.  INR 1.2. 10/6 hCG quant elevated at 15.5, 10/7 hCG beta chain quant less than 1 (normal) T. bili, alk phos, transaminases normal.  Albumin low 3.1. UA with greater than 50 WBCs, many bacteria, 21-50 RBCs, large leukocytes. EtOH level less than 10.  Urine tox screen positive for benzodiazepines otherwise negative. Blood cultures negative.  Current meds include ongoing levophed, Solu-Cortef, folic acid, midodrine, multivitamin/minerals, thiamine, Protonix 80 mg po bid, as needed Mylanta.  Zofran as needed.  She also feels that IV Phenergan was helpful but has not received this in a few days.  She is not vomiting as much as she was but she still had incidence of vomiting within an hour or 2 of eating this morning's breakfast.  Did not tolerate yesterday's attempt to place core track feeding tube.  Family history of brother with ulcer disease.  Maternal grandmother had ovarian cancer.  Lives in [place] with her fianc.  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    Has not smoked cigarettes for 5 years.  Has 3 kids.    Past Medical History:  Diagnosis Date   Anxiety    Bipolar 1 disorder (HCC)    Bursitis of hip    Chronic bronchitis    Hip joint pain    Seizures (HCC)     Past Surgical History:  Procedure Laterality Date   CESAREAN SECTION     2002   KNEE ARTHROSCOPY     MOUTH SURGERY  2010    Prior to Admission medications   Medication Sig Start Date End Date Taking? Authorizing Provider  beclomethasone (QVAR) 40 MCG/ACT inhaler Inhale 1 puff into the lungs 2 (two) times daily. Patient not taking: Reported on 05/01/2022    [provider]  busPIRone (BUSPAR) 10 MG tablet Take 10 mg by mouth 2  (two) times daily. Patient not taking: Reported on 05/01/2022 04/28/22   [provider]  busPIRone (BUSPAR) 5 MG tablet Take 5 mg by mouth 2 (two) times daily. Patient not taking: Reported on 05/01/2022 03/02/22   [provider]  ciprofloxacin (CIPRO) 250 MG tablet SMARTSIG:1 Tablet(s) By Mouth Every 12 Hours Patient not taking: Reported on 05/01/2022 04/23/22   [provider]  cyclobenzaprine (FLEXERIL) 10 MG tablet take 1 tablet twice daily Patient not taking: Reported on 05/01/2022 05/12/16   [provider]  diazepam (VALIUM) 5 MG tablet SMARTSIG:1-2 Tablet(s) By Mouth PRN Patient not taking: Reported on 05/01/2022 04/02/22   [provider]  diclofenac Sodium (VOLTAREN) 1 % GEL SMARTSIG:4 Gram(s) Topical 4 Times Daily PRN Patient not taking: Reported on 05/01/2022 04/23/22   [provider]  gabapentin (NEURONTIN) 100 MG capsule take 1 capsule three times daily Patient not taking: Reported on 05/01/2022 05/02/16   [provider]  gabapentin (NEURONTIN) 100 MG capsule Take by mouth. Patient not taking: Reported on 05/01/2022    [provider]  IBU 800 MG tablet Take 800 mg by mouth 3 (three) times daily as needed. Patient not taking: Reported on 05/01/2022 04/17/22   [provider]  ibuprofen (ADVIL,MOTRIN) 200 MG tablet Take 600 mg by mouth every 6 (six) hours as needed for mild pain. Patient not taking: Reported on 05/01/2022    [provider]  levETIRAcetam (KEPPRA) 500 MG tablet Take 500 mg by mouth 2 (two) times daily. Patient not taking: Reported on 05/01/2022 04/28/22   [provider]  nitrofurantoin, macrocrystal-monohydrate, (MACROBID) 100 MG capsule Take 100 mg by mouth 2 (two) times daily. Patient not taking: Reported on 05/01/2022 04/17/22   [provider]  omeprazole (PRILOSEC) 10 MG capsule Take by mouth. Patient not taking: Reported on 05/01/2022    [provider]   ondansetron (ZOFRAN-ODT) 4 MG disintegrating tablet Take 4 mg by mouth every 6 (six) hours as needed. Patient not taking: Reported on 05/01/2022 04/23/22   [provider]  PROVENTIL HFA 108 (90 Base) MCG/ACT inhaler Inhale 1 puff into the lungs every 4 (four) hours as needed. Shortness of breathe. Patient not taking: Reported on 05/01/2022 07/04/15   [provider]  traMADol (ULTRAM) 50 MG tablet Take 1 tablet (50 mg total) by mouth every 6 (six) hours as needed. Patient not taking: Reported on 05/01/2022 05/18/16   Hedges, Jeffrey, PA-C  Vitamin D, Ergocalciferol, (DRISDOL) 1.25 MG (50000 UNIT) CAPS capsule Take 50,000 Units by mouth once a week. Patient not taking: Reported on 05/01/2022 04/17/22   [provider]    Scheduled Meds:  busPIRone    10 mg Oral BID   Chlorhexidine Gluconate Cloth  6 each Topical Daily   cholecalciferol  1,000 Units Oral Daily   diazepam  5 mg Oral Once   feeding supplement  237 mL Oral BID BM   folic acid  1 mg Oral Daily   heparin  5,000 Units Subcutaneous Q8H   hydrocortisone sod succinate (SOLU-CORTEF) inj  100 mg Intravenous Q8H   levETIRAcetam  500 mg Oral BID   midodrine  15 mg Oral TID WC   multivitamin with minerals  1 tablet Oral Daily   pantoprazole  80 mg Oral BID   sodium bicarbonate  1,300 mg Oral BID   thiamine  100 mg Oral Daily   Infusions:  sodium chloride Stopped (05/05/22 2130)   lactated ringers 75 mL/hr at 05/06/22 0500   norepinephrine (LEVOPHED) Adult infusion 5 mcg/min (05/06/22 0500)   PRN Meds: acetaminophen (TYLENOL) oral liquid 160 mg/5 mL, ALPRAZolam, alum & mag hydroxide-simeth, docusate sodium, lidocaine, melatonin, ondansetron, mouth rinse, polyethylene glycol   Allergies as of 05/01/2022 - Review Complete 05/01/2022  Allergen Reaction Noted   Cucumber extract Anaphylaxis 03/14/2011   Keflex [cephalexin] Nausea And Vomiting 10/21/2012   Macrobid [nitrofurantoin monohydrate macrocrystals] Hives  and Swelling 03/23/2011   Naproxen Nausea And Vomiting 09/06/2011   Penicillins Nausea And Vomiting 12/15/2011   Tramadol Nausea And Vomiting 06/29/2016   Watermelon concentrate Diarrhea 03/14/2011   Darvocet [propoxyphene n-acetaminophen] Nausea And Vomiting and Other (See Comments) 02/21/2011   Flagyl [metronidazole hcl] Hives, Swelling, and Rash 03/23/2011   Ketorolac Nausea And Vomiting and Other (See Comments) 02/21/2011   Ultram [tramadol hcl] Nausea And Vomiting and Other (See Comments) 02/21/2011   Vicodin [hydrocodone-acetaminophen] Nausea And Vomiting and Other (See Comments) 02/21/2011    Family History  Problem Relation Age of Onset   Hypertension Mother    Diabetes Other    Cancer Other     Social History   Socioeconomic History   Marital status: Legally Separated    Spouse name: Not on file   Number of children: Not on file   Years of education: Not on file   Highest education level: Not on file  Occupational History   Not on file  Tobacco Use   Smoking status: Every Day    Packs/day: 0.50    Types: Cigarettes   Smokeless tobacco: Never  Substance and Sexual Activity   Alcohol use: Yes    Comment: rarely   Drug use: No   Sexual activity: Never    Birth control/protection: None  Other Topics Concern   Not on file  Social History Narrative   Not on file   Social Determinants of Health   Financial Resource Strain: Not on file  Food Insecurity: Not on file  Transportation Needs: Not on file  Physical Activity: Not on file  Stress: Not on file  Social Connections: Not on file  Intimate Partner Violence: Not on file    REVIEW OF SYSTEMS: Constitutional: Some fatigue and weakness, this has improved over the last several days. ENT:  No nose bleeds Pulm: No shortness of breath.  No productive cough. CV:  No palpitations, no LE edema.  No angina GU:  No hematuria, no frequency GI: See HPI. Heme: Unusual bleeding or bruising.  Has developed bruising  at sites of subcu heparin injection. Transfusions: None Neuro:  No headaches, no peripheral tingling or numbness.  Effort is seizure a few months ago. Derm:  No itching, no rash or sores.  Endocrine:    No sweats or chills.  No polyuria or dysuria Immunization: Reviewed.  There is no immunization on previous record.  Received COVID-vaccine #1 yesterday 10/10. Travel:  None beyond local counties in last few months.    PHYSICAL EXAM: Vital signs in last 24 hours: Vitals:   05/06/22 0800 05/06/22 0900  BP: 101/73 105/75  Pulse:    Resp: (!) 21 13  Temp:    SpO2:     Wt Readings from Last 3 Encounters:  05/06/22 53.1 kg  05/18/16 45.4 kg  06/15/15 48.5 kg    General: Pleasant, cooperative.  Thin. Head: No facial asymmetry or swelling.  No signs of head trauma. Eyes: No scleral icterus.  No conjunctival pallor. Ears: No hearing deficit. Nose: No congestion or discharge. Mouth: Edentulous.  Tongue piercing in place.  Tongue midline.  Mucosa moist, pink, clear. Neck: No JVD, no masses, no thyromegaly. Lungs: Clear bilaterally.  No labored breathing, no cough. Heart: RRR.  No MRG.  S1, S2 present..   Abdomen: Soft, ND.  Mild tenderness in the lower abdomen across where she has some bruising from subcutaneous heparin.  Bowel sounds active. Rectal: Deferred. Musc/Skeltl: No joint redness, swelling or gross deformity.  Thin arms and legs. Extremities: No CCE. Neurologic: Alert.  Oriented x3.  Moves all 4 limbs, no tremors.  No gross deficits.  No formal strength testing performed. Skin: No rash, no sores, no suspicious lesions. Tattoos: Several professional grade tattoos on the limbs Nodes: No cervical adenopathy, no inguinal adenopathy. Psych:, Pleasant, cooperative.  Intake/Output from previous day: 10/10 0701 - 10/11 0700 In: 2080.2 [P.O.:300; I.V.:1780.2] Out: 950 [Urine:950] Intake/Output this shift: No intake/output data recorded.  LAB RESULTS: Recent Labs     05/04/22 0455  WBC 8.6  HGB 10.0*  HCT 28.1*  PLT 474*   BMET Lab Results  Component Value Date   NA 135 05/05/2022   NA 133 (L) 05/04/2022   NA 128 (L) 05/03/2022   K 3.8 05/05/2022   K 3.9 05/04/2022   K 3.1 (L) 05/03/2022   CL 111 05/05/2022   CL 107 05/04/2022   CL 101 05/03/2022   CO2 12 (L) 05/05/2022   CO2 11 (L) 05/04/2022   CO2 15 (L) 05/03/2022   GLUCOSE 114 (H) 05/05/2022   GLUCOSE 170 (H) 05/04/2022   GLUCOSE 113 (H) 05/03/2022   BUN 36 (H) 05/05/2022   BUN 36 (H) 05/04/2022   BUN 53 (H) 05/03/2022   CREATININE 1.60 (H) 05/05/2022   CREATININE 1.64 (H) 05/04/2022   CREATININE 2.00 (H) 05/03/2022   CALCIUM 7.2 (L) 05/05/2022   CALCIUM 7.7 (L) 05/04/2022   CALCIUM 7.5 (L) 05/03/2022   LFT No results for input(s): "PROT", "ALBUMIN", "AST", "ALT", "ALKPHOS", "BILITOT", "BILIDIR", "IBILI" in the last 72 hours. PT/INR Lab Results  Component Value Date   INR 1.2 05/01/2022   Hepatitis Panel No results for input(s): "HEPBSAG", "HCVAB", "HEPAIGM", "HEPBIGM" in the last 72 hours. C-Diff No components found for: "CDIFF" Lipase     Component Value Date/Time   LIPASE 27 03/22/2015 1835    Drugs of Abuse     Component Value Date/Time   LABOPIA NONE DETECTED 05/01/2022 1940   COCAINSCRNUR NONE DETECTED 05/01/2022 1940   LABBENZ POSITIVE (A) 05/01/2022 1940   AMPHETMU NONE DETECTED 05/01/2022 1940   THCU NONE DETECTED 05/01/2022 1940   LABBARB NONE DETECTED 05/01/2022 1940     RADIOLOGY STUDIES: No results found.    IMPRESSION:     Nausea, vomiting.    Non-bloody, non CG.  CT ab/pelvis unrevealing.  Though I suspect it will be low yield, probably should pursue upper endoscopy.    PLAN:     EGD, we will set this up for tomorrow.  However final decision per Dr. Armbruster.  Patient agreeable.   Vinicio Lynk  05/06/2022, 11:03 AM Phone [phone]      

## 2022-05-06 NOTE — Progress Notes (Signed)
NAME:  Jamie Shields, MRN:  [id number], DOB:  November 21, 1980, LOS: 5 ADMISSION DATE:  05/01/2022, CONSULTATION DATE:  10/6 REFERRING MD:  Oswaldo Milian, CHIEF COMPLAINT:  shock    History of Present Illness:  41 year old chronically ill, malnourished female,  Presents to ER 10/6 w/ cc: Severe weakness, nausea and vomiting.  Per the patient she has had ongoing nausea and vomiting for over 4 weeks.  Initially seen by primary care provider about 4 weeks prior at which time was diagnosed with a urinary tract infection and prescribed Cipro.  She has continued to have nausea and vomiting, reports the vomiting occurs almost immediately after eating.  She is essentially been at home over the last several weeks getting weaker and weaker, only able to keep down clear liquids and broth.  She is denied shortness of breath, chest pain, cough.  Has not had any abdominal pain, or diarrhea.  Presented to the emergency room at the insistence of her fianc.  On arrival she was found to be hypotensive with systolic blood pressure in the 80s, Had mild lactic acidosis, 2.2, initial sodium 124, potassium 2.3, with anion gap metabolic acidosis.  Initial white blood cell count was 14.1.  Cultures were sent, she was initiated on IV crystalloid, 30 mL/kg.  Following fluid volume resuscitation still had point of care ultrasound suggesting volume depletion and thus received more IV hydration.  IV antibiotics were initiated, she was started on norepinephrine infusion, and critical care asked to admit.  Pertinent  Medical History  Remote IV drug abuse.  Chronic bronchitis, history of seizure disorder, bipolar disease, anxiety. Significant Hospital Events: Including procedures, antibiotic start and stop dates in addition to other pertinent events   Admitted 10/6 with profound weakness, nausea, vomiting, hypotension.  Started on IV hydration, culture sent, empiric antibiotics initiated 10/7 CT abd/pelvis distended gallbladder but no gallstones or  gallbladder wall thickening.  Small left pleural effusion anasarca.  Central line placed 10/8 CT head negative stress dose steroids started still on pressors, midodrine initiated.  Echocardiogram obtained EF 70 to 75% hyperdynamic diastolic parameters normal no evidence of valvular vegetation.  The aortic valve showed severe regurgitation 10/9 blood pressure goal changed to systolic blood pressure greater than 90 added PPI for ongoing "heartburn". She has requested DNR status  10/10 SBP goal > 85. Inc'd midodrine to 15 tid, stopped vasopressin. Added back low MIVFs. Getting OOB.  10/11 no distress. BP requirements better GI consulted. MRI ordered   Interim History / Subjective:  Feeling better  Still reporting intermittent nausea Objective   Blood pressure 100/66, pulse 77, temperature (Abnormal) 97.4 F (36.3 C), temperature source Oral, resp. rate 13, height 5\' 6"  (1.676 m), weight 53.1 kg, SpO2 100 %.        Intake/Output Summary (Last 24 hours) at 05/06/2022 0911 Last data filed at 05/06/2022 0500 Gross per 24 hour  Intake 1726.48 ml  Output 950 ml  Net 776.48 ml  Net IO Since Admission: 15,004.92 mL [05/06/22 0911]  Filed Weights   05/02/22 0500 05/05/22 0500 05/06/22 0500  Weight: 44.7 kg 51.6 kg 53.1 kg    Examination: General: 41 year old female patient resting in bed currently in no acute distress HEENT normocephalic atraumatic no jugular venous distention mucous membranes are moist Pulmonary: Clear, diminished bases, no accessory use Cardiac: Regular rate and rhythm. Abdomen: Soft not tender no organomegaly.  Still having intermittent episodes of nausea as mentioned above Neuro: Awake oriented no focal deficit GU: Voiding Extremities: Diffuse anasarca.  She is now over 14 L positive for I/O balance  Resolved Hospital Problem list   Anion gap metabolic acidosis: Multifactorial due to uremia, renal failure, lactic acidosis, concern for starvation ketosis.  BHCG  positive: Likely reflective of hypovolemia or hemoconcentration. Repeat HCG negative after hydration. History of UTI.  All cultures negative.  Procalcitonin negative.  Sepsis ruled out Assessment & Plan:  Principal Problem:   Hypovolemic shock (HCC) Active Problems:   Severe dehydration   Nausea and vomiting   Disorders of fluid, electrolyte, and acid-base balance   Hyponatremia   AKI (acute kidney injury) (HCC)   Severe protein-calorie malnutrition (HCC)   Seizure disorder (HCC)   Bipolar disease, chronic (HCC)   Normal anion gap metabolic acidosis   DNR (do not resuscitate)   Adrenal insufficiency (HCC)   History of seizures  Hypotension: Suspect hypovolemia given nausea vomiting poor p.o. intake.  Also consider adrenal insufficiency -Pressor requirements: Continue to improve Plan Day #4 stress dose steroids, we will begin to taper after 5 days of therapy, may need to be placed on maintenance therapy  Continue midodrine, currently 15 mg 3 times daily  Continue to titrate norepinephrine for systolic blood pressure greater than 85  Continue to maximize nutritional support, her third spacing is responsible for a great deal of this  Continuing IV hydration    Mild to moderateaortic regurgitation Appreciate cardiology evaluation see note on 10/10  plan Follow-up echocardiogram as outpatient   Renal failure: Suspect hypovolemic as above.  Improving with fluids Plan Continuing maintenance IV fluids Systolic blood pressure goal greater than 85  Nausea and vomiting: Chronic.  Shortly after eating.  Suspicious for anatomic lesion. Now with ongoing hypotension adrenal insufficiency considered.  -- CT A/P without etiology Plan As needed Zofran Gastroenterology consult was requested ? Ulcer ? EGD   Possible adrenal insufficiency Plan Day #4 steroids, see above MRI brain MRI brain when off pressors.   Fluid and electrolyte imbalance: Hyponatremia, NAGMA Combination of poor  solute intake, hypovolemia. Concern for adrenal insufficiency->slowly improving Plan Cont IVFs Day 3 stress dose steroids Add bicarb replacement  Serial chems to ensure no hypokalemia    Severe protein calorie malnutrition ->tried post-pyloric; could not place. Doubt she would want PEG.  Plan RD following  H/o seizure d/o Plan Cont home AEDs  H/o bipolar disease Plan Buspar  Asking for psych consult-->   Best Practice (right click and "Reselect all SmartList Selections" daily)   Diet/type: Regular consistency (see orders) DVT prophylaxis: prophylactic heparin  GI prophylaxis: PPI Lines: N/A Foley:  N/A Code Status:  full code Last date of multidisciplinary goals of care discussion [pending]   Critical care time: 32 minutes   CRITICAL CARE Performed by: Melton Alar, NP [facility] Pulmonary/Critical Care See Amion for contact info OR # [phone] if no answer

## 2022-05-06 NOTE — Consult Note (Signed)
[facility] Consult: 11:03 AM 05/06/2022  LOS: 5 days    Referring Provider: Dr Governor Rooks, CCM  Primary Care Physician:  Nolene Ebbs, MD Primary Gastroenterologist:  unassigned     Reason for Consultation: Nausea, vomiting   HPI: Jamie Shields is a 41 y.o. female.  PMH bipolar disorder, anxiety.  Seizures.  Chronic bronchitis.  Remote IV drug abuse.  Surgeries include C-section, knee arthroscopy, oral surgery.  Decades long history of intermittent bouts with nausea and vomiting.  Had an EGD in [place] 30 years ago, it showed an ulcer.  The only med she uses at home is Rolaids, no PPI or H2 blocker.  At home not currently taking PPI or H2 blocker.  PCP recently restarted her ibuprofen 800 mg daily but she is not sure why, perhaps for her headaches. For several weeks she has had nonbloody nausea and vomiting sometimes just more of a regurgitation.  It mostly occurs with p.o. intake even water but can occur on an empty stomach.  No significant abdominal pain.  No diarrhea. Pt also reports that she has always been on the thin side, previous baseline weight of 115 pounds.  Currently measures 117 pounds.  Hospital day 5.  Presented with profound weakness, 4 weeks nausea and vomiting.  About 4 weeks prior diagnosed by PCP with UTI, treated with Cipro.  Nausea and vomiting continued, especially associated postprandial, and she stated home becoming progressively weaker.  Her fianc insisted she come to the emergency room Arrival she was hyponatremic at 124, hypokalemic 2.3, mild lactic acidosis.  Systolic BPs in the 32G.  Antibiotics initiated along with multiple pressors.  Received infusion of albumin 3 days ago.  05/02/2022 CTAP showed gallbladder distention without stones or wall thickening, anasarca, left pleural  effusion. 10/8 CT head negative. 10/8 echocardiogram with LVEF 70 to 75%, no vegetations, severe aortic valve regurgitation.  Current labs with lactic acid 2.1.  Hb 10, MCV 92.  Platelets 474.  WBCs normal.  Folate low 2.3.  Vitamin D low.  Vitamin B12 okay.  INR 1.2. 10/6 hCG quant elevated at 15.5, 10/7 hCG beta chain quant less than 1 (normal) T. bili, alk phos, transaminases normal.  Albumin low 3.1. UA with greater than 50 WBCs, many bacteria, 21-50 RBCs, large leukocytes. EtOH level less than 10.  Urine tox screen positive for benzodiazepines otherwise negative. Blood cultures negative.  Current meds include ongoing levophed, Solu-Cortef, folic acid, midodrine, multivitamin/minerals, thiamine, Protonix 80 mg po bid, as needed Mylanta.  Zofran as needed.  She also feels that IV Phenergan was helpful but has not received this in a few days.  She is not vomiting as much as she was but she still had incidence of vomiting within an hour or 2 of eating this morning's breakfast.  Did not tolerate yesterday's attempt to place core track feeding tube.  Family history of brother with ulcer disease.  Maternal grandmother had ovarian cancer.  Lives in [place] with her fianc.  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  Has not smoked cigarettes for 5 years.  Has 3 kids.    Past Medical History:  Diagnosis Date   Anxiety    Bipolar 1 disorder ([place])    Bursitis of hip    Chronic bronchitis    Hip joint pain    Seizures ([place])     Past Surgical History:  Procedure Laterality Date   CESAREAN SECTION     2002   KNEE ARTHROSCOPY     MOUTH SURGERY  2010    Prior to Admission medications   Medication Sig Start Date End Date Taking? Authorizing Provider  beclomethasone (QVAR) 40 MCG/ACT inhaler Inhale 1 puff into the lungs 2 (two) times daily. Patient not taking: Reported on 05/01/2022    [provider]  busPIRone (BUSPAR) 10 MG tablet Take 10 mg by mouth 2  (two) times daily. Patient not taking: Reported on 05/01/2022 04/28/22   [provider]  busPIRone (BUSPAR) 5 MG tablet Take 5 mg by mouth 2 (two) times daily. Patient not taking: Reported on 05/01/2022 03/02/22   [provider]  ciprofloxacin (CIPRO) 250 MG tablet SMARTSIG:1 Tablet(s) By Mouth Every 12 Hours Patient not taking: Reported on 05/01/2022 04/23/22   [provider]  cyclobenzaprine (FLEXERIL) 10 MG tablet take 1 tablet twice daily Patient not taking: Reported on 05/01/2022 05/12/16   [provider]  diazepam (VALIUM) 5 MG tablet SMARTSIG:1-2 Tablet(s) By Mouth PRN Patient not taking: Reported on 05/01/2022 04/02/22   [provider]  diclofenac Sodium (VOLTAREN) 1 % GEL SMARTSIG:4 Gram(s) Topical 4 Times Daily PRN Patient not taking: Reported on 05/01/2022 04/23/22   [provider]  gabapentin (NEURONTIN) 100 MG capsule take 1 capsule three times daily Patient not taking: Reported on 05/01/2022 05/02/16   [provider]  gabapentin (NEURONTIN) 100 MG capsule Take by mouth. Patient not taking: Reported on 05/01/2022    [provider]  IBU 800 MG tablet Take 800 mg by mouth 3 (three) times daily as needed. Patient not taking: Reported on 05/01/2022 04/17/22   [provider]  ibuprofen (ADVIL,MOTRIN) 200 MG tablet Take 600 mg by mouth every 6 (six) hours as needed for mild pain. Patient not taking: Reported on 05/01/2022    [provider]  levETIRAcetam (KEPPRA) 500 MG tablet Take 500 mg by mouth 2 (two) times daily. Patient not taking: Reported on 05/01/2022 04/28/22   [provider]  nitrofurantoin, macrocrystal-monohydrate, (MACROBID) 100 MG capsule Take 100 mg by mouth 2 (two) times daily. Patient not taking: Reported on 05/01/2022 04/17/22   [provider]  omeprazole (PRILOSEC) 10 MG capsule Take by mouth. Patient not taking: Reported on 05/01/2022    [provider]   ondansetron (ZOFRAN-ODT) 4 MG disintegrating tablet Take 4 mg by mouth every 6 (six) hours as needed. Patient not taking: Reported on 05/01/2022 04/23/22   [provider]  PROVENTIL HFA 108 (90 Base) MCG/ACT inhaler Inhale 1 puff into the lungs every 4 (four) hours as needed. Shortness of breathe. Patient not taking: Reported on 05/01/2022 07/04/15   [provider]  traMADol (ULTRAM) 50 MG tablet Take 1 tablet (50 mg total) by mouth every 6 (six) hours as needed. Patient not taking: Reported on 05/01/2022 05/18/16   Okey Regal, PA-C  Vitamin D, Ergocalciferol, (DRISDOL) 1.25 MG (50000 UNIT) CAPS capsule Take 50,000 Units by mouth once a week. Patient not taking: Reported on 05/01/2022 04/17/22   [provider]    Scheduled Meds:  busPIRone  10 mg Oral BID   Chlorhexidine Gluconate Cloth  6 each Topical Daily   cholecalciferol  1,000 Units Oral Daily   diazepam  5 mg Oral Once   feeding supplement  237 mL Oral BID BM   folic acid  1 mg Oral Daily   heparin  5,000 Units Subcutaneous Q8H   hydrocortisone sod succinate (SOLU-CORTEF) inj  100 mg Intravenous Q8H   levETIRAcetam  500 mg Oral BID   midodrine  15 mg Oral TID WC   multivitamin with minerals  1 tablet Oral Daily   pantoprazole  80 mg Oral BID   sodium bicarbonate  1,300 mg Oral BID   thiamine  100 mg Oral Daily   Infusions:  sodium chloride Stopped (05/05/22 2130)   lactated ringers 75 mL/hr at 05/06/22 0500   norepinephrine (LEVOPHED) Adult infusion 5 mcg/min (05/06/22 0500)   PRN Meds: acetaminophen (TYLENOL) oral liquid 160 mg/5 mL, ALPRAZolam, alum & mag hydroxide-simeth, docusate sodium, lidocaine, melatonin, ondansetron, mouth rinse, polyethylene glycol   Allergies as of 05/01/2022 - Review Complete 05/01/2022  Allergen Reaction Noted   Cucumber extract Anaphylaxis 03/14/2011   Keflex [cephalexin] Nausea And Vomiting 10/21/2012   Macrobid [nitrofurantoin monohydrate macrocrystals] Hives  and Swelling 03/23/2011   Naproxen Nausea And Vomiting 09/06/2011   Penicillins Nausea And Vomiting 12/15/2011   Tramadol Nausea And Vomiting 06/29/2016   Watermelon concentrate Diarrhea 03/14/2011   Darvocet [propoxyphene n-acetaminophen] Nausea And Vomiting and Other (See Comments) 02/21/2011   Flagyl [metronidazole hcl] Hives, Swelling, and Rash 03/23/2011   Ketorolac Nausea And Vomiting and Other (See Comments) 02/21/2011   Ultram [place] Mode hcl] Nausea And Vomiting and Other (See Comments) 02/21/2011   Vicodin [hydrocodone-acetaminophen] Nausea And Vomiting and Other (See Comments) 02/21/2011    Family History  Problem Relation Age of Onset   Hypertension Mother    Diabetes Other    Cancer Other     Social History   Socioeconomic History   Marital status: Legally Separated    Spouse name: Not on file   Number of children: Not on file   Years of education: Not on file   Highest education level: Not on file  Occupational History   Not on file  Tobacco Use   Smoking status: Every Day    Packs/day: 0.50    Types: Cigarettes   Smokeless tobacco: Never  Substance and Sexual Activity   Alcohol use: Yes    Comment: rarely   Drug use: No   Sexual activity: Never    Birth control/protection: None  Other Topics Concern   Not on file  Social History Narrative   Not on file   Social Determinants of Health   Financial Resource Strain: Not on file  Food Insecurity: Not on file  Transportation Needs: Not on file  Physical Activity: Not on file  Stress: Not on file  Social Connections: Not on file  Intimate Partner Violence: Not on file    REVIEW OF SYSTEMS: Constitutional: Some fatigue and weakness, this has improved over the last several days. ENT:  No nose bleeds Pulm: No shortness of breath.  No productive cough. CV:  No palpitations, no LE edema.  No angina GU:  No hematuria, no frequency GI: See HPI. Heme: Unusual bleeding or bruising.  Has developed bruising  at sites of subcu heparin injection. Transfusions: None Neuro:  No headaches, no peripheral tingling or numbness.  Effort is seizure a few months ago. Derm:  No itching, no rash or sores.  Endocrine:  No sweats or chills.  No polyuria or dysuria Immunization: Reviewed.  There is no immunization on previous record.  Received COVID-vaccine #1 yesterday 10/10. Travel:  None beyond local counties in last few months.    PHYSICAL EXAM: Vital signs in last 24 hours: Vitals:   05/06/22 0800 05/06/22 0900  BP: 101/73 105/75  Pulse:    Resp: (!) 21 13  Temp:    SpO2:     Wt Readings from Last 3 Encounters:  05/06/22 53.1 kg  05/18/16 45.4 kg  06/15/15 48.5 kg    General: Pleasant, cooperative.  Thin. Head: No facial asymmetry or swelling.  No signs of head trauma. Eyes: No scleral icterus.  No conjunctival pallor. Ears: No hearing deficit. Nose: No congestion or discharge. Mouth: Edentulous.  Tongue piercing in place.  Tongue midline.  Mucosa moist, pink, clear. Neck: No JVD, no masses, no thyromegaly. Lungs: Clear bilaterally.  No labored breathing, no cough. Heart: RRR.  No MRG.  S1, S2 present..   Abdomen: Soft, ND.  Mild tenderness in the lower abdomen across where she has some bruising from subcutaneous heparin.  Bowel sounds active. Rectal: Deferred. Musc/Skeltl: No joint redness, swelling or gross deformity.  Thin arms and legs. Extremities: No CCE. Neurologic: Alert.  Oriented x3.  Moves all 4 limbs, no tremors.  No gross deficits.  No formal strength testing performed. Skin: No rash, no sores, no suspicious lesions. Tattoos: Several professional grade tattoos on the limbs Nodes: No cervical adenopathy, no inguinal adenopathy. Psych:, Pleasant, cooperative.  Intake/Output from previous day: 10/10 0701 - 10/11 0700 In: 2080.2 [P.O.:300; I.V.:1780.2] Out: 950 [Urine:950] Intake/Output this shift: No intake/output data recorded.  LAB RESULTS: Recent Labs     05/04/22 0455  WBC 8.6  HGB 10.0*  HCT 28.1*  PLT 474*   BMET Lab Results  Component Value Date   NA 135 05/05/2022   NA 133 (L) 05/04/2022   NA 128 (L) 05/03/2022   K 3.8 05/05/2022   K 3.9 05/04/2022   K 3.1 (L) 05/03/2022   CL 111 05/05/2022   CL 107 05/04/2022   CL 101 05/03/2022   CO2 12 (L) 05/05/2022   CO2 11 (L) 05/04/2022   CO2 15 (L) 05/03/2022   GLUCOSE 114 (H) 05/05/2022   GLUCOSE 170 (H) 05/04/2022   GLUCOSE 113 (H) 05/03/2022   BUN 36 (H) 05/05/2022   BUN 36 (H) 05/04/2022   BUN 53 (H) 05/03/2022   CREATININE 1.60 (H) 05/05/2022   CREATININE 1.64 (H) 05/04/2022   CREATININE 2.00 (H) 05/03/2022   CALCIUM 7.2 (L) 05/05/2022   CALCIUM 7.7 (L) 05/04/2022   CALCIUM 7.5 (L) 05/03/2022   LFT No results for input(s): "PROT", "ALBUMIN", "AST", "ALT", "ALKPHOS", "BILITOT", "BILIDIR", "IBILI" in the last 72 hours. PT/INR Lab Results  Component Value Date   INR 1.2 05/01/2022   Hepatitis Panel No results for input(s): "HEPBSAG", "HCVAB", "HEPAIGM", "HEPBIGM" in the last 72 hours. C-Diff No components found for: "CDIFF" Lipase     Component Value Date/Time   LIPASE 27 03/22/2015 1835    Drugs of Abuse     Component Value Date/Time   LABOPIA NONE DETECTED 05/01/2022 1940   COCAINSCRNUR NONE DETECTED 05/01/2022 1940   LABBENZ POSITIVE (A) 05/01/2022 1940   AMPHETMU NONE DETECTED 05/01/2022 1940   THCU NONE DETECTED 05/01/2022 1940   LABBARB NONE DETECTED 05/01/2022 1940     RADIOLOGY STUDIES: No results found.    IMPRESSION:     Nausea, vomiting.  Non-bloody, non CG.  CT ab/pelvis unrevealing.  Though I suspect it will be low yield, probably should pursue upper endoscopy.    PLAN:     EGD, we will set this up for tomorrow.  However final decision per Dr. Havery Moros.  Patient agreeable.   Azucena Freed  05/06/2022, 11:03 AM Phone [phone]

## 2022-05-06 NOTE — Progress Notes (Signed)
F/u lactate 2.8.  Giving LR bolus Plan F/u lactate  Erick Colace ACNP-BC [place] Pager # [phone] OR # [phone] if no answer

## 2022-05-07 ENCOUNTER — Inpatient Hospital Stay (HOSPITAL_COMMUNITY): Payer: Medicaid Other

## 2022-05-07 ENCOUNTER — Encounter (HOSPITAL_COMMUNITY)
Admission: EM | Disposition: A | Discharge status: Home / Self Care | Payer: Self-pay | Source: Home / Self Care | Attending: Pulmonary Disease

## 2022-05-07 ENCOUNTER — Inpatient Hospital Stay (HOSPITAL_COMMUNITY): Payer: Medicaid Other | Admitting: Anesthesiology

## 2022-05-07 ENCOUNTER — Encounter (HOSPITAL_COMMUNITY): Payer: Self-pay | Admitting: Pulmonary Disease

## 2022-05-07 DIAGNOSIS — F419 Anxiety disorder, unspecified: Secondary | ICD-10-CM

## 2022-05-07 DIAGNOSIS — K449 Diaphragmatic hernia without obstruction or gangrene: Secondary | ICD-10-CM

## 2022-05-07 DIAGNOSIS — K229 Disease of esophagus, unspecified: Secondary | ICD-10-CM

## 2022-05-07 DIAGNOSIS — K2289 Other specified disease of esophagus: Secondary | ICD-10-CM

## 2022-05-07 DIAGNOSIS — D649 Anemia, unspecified: Secondary | ICD-10-CM

## 2022-05-07 DIAGNOSIS — E861 Hypovolemia: Secondary | ICD-10-CM | POA: Diagnosis not present

## 2022-05-07 DIAGNOSIS — I9589 Other hypotension: Secondary | ICD-10-CM | POA: Diagnosis not present

## 2022-05-07 HISTORY — PX: BIOPSY: SHX5522

## 2022-05-07 HISTORY — PX: ESOPHAGOGASTRODUODENOSCOPY (EGD) WITH PROPOFOL: SHX5813

## 2022-05-07 LAB — COMPREHENSIVE METABOLIC PANEL
ALT: 139 U/L — ABNORMAL HIGH (ref 0–44)
AST: 178 U/L — ABNORMAL HIGH (ref 15–41)
Albumin: 2.5 g/dL — ABNORMAL LOW (ref 3.5–5.0)
Alkaline Phosphatase: 180 U/L — ABNORMAL HIGH (ref 38–126)
Anion gap: 10 (ref 5–15)
BUN: 27 mg/dL — ABNORMAL HIGH (ref 6–20)
CO2: 18 mmol/L — ABNORMAL LOW (ref 22–32)
Calcium: 7.9 mg/dL — ABNORMAL LOW (ref 8.9–10.3)
Chloride: 110 mmol/L (ref 98–111)
Creatinine, Ser: 1.43 mg/dL — ABNORMAL HIGH (ref 0.44–1.00)
GFR, Estimated: 48 mL/min — ABNORMAL LOW (ref >60)
Glucose, Bld: 98 mg/dL (ref 70–99)
Potassium: 4.2 mmol/L (ref 3.5–5.1)
Sodium: 138 mmol/L (ref 135–145)
Total Bilirubin: 0.3 mg/dL (ref 0.3–1.2)
Total Protein: 4.5 g/dL — ABNORMAL LOW (ref 6.5–8.1)

## 2022-05-07 LAB — GLUCOSE, CAPILLARY: Glucose-Capillary: 92 mg/dL (ref 70–99)

## 2022-05-07 LAB — TROPONIN I (HIGH SENSITIVITY): Troponin I (High Sensitivity): 8 ng/L (ref <18)

## 2022-05-07 SURGERY — ESOPHAGOGASTRODUODENOSCOPY (EGD) WITH PROPOFOL
Anesthesia: Monitor Anesthesia Care

## 2022-05-07 MED ORDER — FLUCONAZOLE 100 MG PO TABS
200.0000 mg | ORAL_TABLET | Freq: Every day | ORAL | Status: DC
  Administered 2022-05-08 – 2022-05-14 (×7): 200 mg via ORAL
  Filled 2022-05-07 (×7): qty 2

## 2022-05-07 MED ORDER — MAGNESIUM SULFATE 2 GM/50ML IV SOLN
2.0000 g | Freq: Once | INTRAVENOUS | Status: AC
  Administered 2022-05-07: 2 g via INTRAVENOUS
  Filled 2022-05-07: qty 50

## 2022-05-07 MED ORDER — HYDROCORTISONE SOD SUC (PF) 100 MG IJ SOLR
100.0000 mg | Freq: Two times a day (BID) | INTRAMUSCULAR | Status: DC
  Administered 2022-05-07 – 2022-05-09 (×4): 100 mg via INTRAVENOUS
  Filled 2022-05-07 (×6): qty 2

## 2022-05-07 MED ORDER — FLUCONAZOLE 200 MG PO TABS
400.0000 mg | ORAL_TABLET | Freq: Once | ORAL | Status: AC
  Administered 2022-05-07: 400 mg via ORAL
  Filled 2022-05-07: qty 2

## 2022-05-07 MED ORDER — PROPOFOL 500 MG/50ML IV EMUL
INTRAVENOUS | Status: DC | PRN
  Administered 2022-05-07: 150 ug/kg/min via INTRAVENOUS

## 2022-05-07 MED ORDER — MIRTAZAPINE 15 MG PO TABS
7.5000 mg | ORAL_TABLET | Freq: Every day | ORAL | Status: DC
  Administered 2022-05-07 – 2022-05-10 (×4): 7.5 mg via ORAL
  Filled 2022-05-07 (×5): qty 1

## 2022-05-07 MED ORDER — DIPHENHYDRAMINE HCL 50 MG/ML IJ SOLN
25.0000 mg | Freq: Four times a day (QID) | INTRAMUSCULAR | Status: DC | PRN
  Administered 2022-05-13: 25 mg via INTRAVENOUS
  Filled 2022-05-07: qty 1

## 2022-05-07 SURGICAL SUPPLY — 15 items

## 2022-05-07 NOTE — Anesthesia Preprocedure Evaluation (Addendum)
Anesthesia Evaluation  Patient identified by MRN, date of birth, ID band Patient awake    Reviewed: Allergy & Precautions, NPO status , Patient's Chart, lab work & pertinent test results  Airway Mallampati: I       Dental no notable dental hx.    Pulmonary Current Smoker and Patient abstained from smoking.,    Pulmonary exam normal        Cardiovascular Normal cardiovascular exam     Neuro/Psych Seizures -,  PSYCHIATRIC DISORDERS Anxiety Bipolar Disorder    GI/Hepatic Neg liver ROS, GERD  Medicated,  Endo/Other    Renal/GU      Musculoskeletal   Abdominal   Peds  Hematology  (+) Blood dyscrasia, anemia ,   Anesthesia Other Findings   Reproductive/Obstetrics                            Anesthesia Physical Anesthesia Plan  ASA: 2  Anesthesia Plan: MAC   Post-op Pain Management:    Induction:   PONV Risk Score and Plan: 1  Airway Management Planned: Natural Airway and Simple Face Mask  Additional Equipment: None  Intra-op Plan:   Post-operative Plan:   Informed Consent: I have reviewed the patients History and Physical, chart, labs and discussed the procedure including the risks, benefits and alternatives for the proposed anesthesia with the patient or authorized representative who has indicated his/her understanding and acceptance.     Dental advisory given  Plan Discussed with: CRNA  Anesthesia Plan Comments:         Anesthesia Quick Evaluation

## 2022-05-07 NOTE — Interval H&P Note (Signed)
History and Physical Interval Note: No interval changes since I have seen the patient. Here for EGD to further evaluate symptoms. Have discussed risks / benefits and she wishes to proceed, all questions answered.  05/07/2022 7:30 AM  Jamie Shields  has presented today for surgery, with the diagnosis of Unexplained nausea, vomiting for decades.  Currently with acute flare..  The various methods of treatment have been discussed with the patient and family. After consideration of risks, benefits and other options for treatment, the patient has consented to  Procedure(s): ESOPHAGOGASTRODUODENOSCOPY (EGD) WITH PROPOFOL (N/A) as a surgical intervention.  The patient's history has been reviewed, patient examined, no change in status, stable for surgery.  I have reviewed the patient's chart and labs.  Questions were answered to the patient's satisfaction.     Alexis

## 2022-05-07 NOTE — Progress Notes (Signed)
NAME:  Jamie Shields, MRN:  [id number], DOB:  02-Nov-1980, LOS: 6 ADMISSION DATE:  05/01/2022, CONSULTATION DATE:  10/6 REFERRING MD:  Cyndee Brightly, CHIEF COMPLAINT:  shock    History of Present Illness:  41 year old chronically ill, malnourished female,  Presents to ER 10/6 w/ cc: Severe weakness, nausea and vomiting.  Per the patient she has had ongoing nausea and vomiting for over 4 weeks.  Initially seen by primary care provider about 4 weeks prior at which time was diagnosed with a urinary tract infection and prescribed Cipro.  She has continued to have nausea and vomiting, reports the vomiting occurs almost immediately after eating.  She is essentially been at home over the last several weeks getting weaker and weaker, only able to keep down clear liquids and broth.  She is denied shortness of breath, chest pain, cough.  Has not had any abdominal pain, or diarrhea.  Presented to the emergency room at the insistence of her fianc.  On arrival she was found to be hypotensive with systolic blood pressure in the 80s, Had mild lactic acidosis, 2.2, initial sodium 124, potassium 2.3, with anion gap metabolic acidosis.  Initial white blood cell count was 14.1.  Cultures were sent, she was initiated on IV crystalloid, 30 mL/kg.  Following fluid volume resuscitation still had point of care ultrasound suggesting volume depletion and thus received more IV hydration.  IV antibiotics were initiated, she was started on norepinephrine infusion, and critical care asked to admit.  Pertinent  Medical History  Remote IV drug abuse.  Chronic bronchitis, history of seizure disorder, bipolar disease, anxiety.  Significant Hospital Events: Including procedures, antibiotic start and stop dates in addition to other pertinent events   Admitted 10/6 with profound weakness, nausea, vomiting, hypotension.  Started on IV hydration, culture sent, empiric antibiotics initiated 10/7 CT abd/pelvis distended gallbladder but no gallstones  or gallbladder wall thickening.  Small left pleural effusion anasarca.  Central line placed 10/8 CT head negative stress dose steroids started still on pressors, midodrine initiated.  Echocardiogram obtained EF 70 to 75% hyperdynamic diastolic parameters normal no evidence of valvular vegetation.  The aortic valve showed severe regurgitation 10/9 blood pressure goal changed to systolic blood pressure greater than 90 added PPI for ongoing "heartburn". She has requested DNR status  10/10 SBP goal > 85. Inc'd midodrine to 15 tid, stopped vasopressin. Added back low MIVFs. Getting OOB.  10/11 no distress. BP requirements better GI consulted.  Transient bradycardia with pauses after getting phenergan 10/12 EGD >> hiatal hernia, esophageal candidiasis  Interim History / Subjective:  Intermittently c/o diffuse pain. Feels like her legs are heavy.  Feels nauseous still, but wants something to eat.  Objective   Blood pressure 115/77, pulse (!) 59, temperature (!) 97.3 F (36.3 C), temperature source Oral, resp. rate 12, height 5\' 6"  (1.676 m), weight 57.7 kg, SpO2 99 %.        Intake/Output Summary (Last 24 hours) at 05/07/2022 0954 Last data filed at 05/07/2022 0747 Gross per 24 hour  Intake 1844.72 ml  Output 550 ml  Net 1294.72 ml  Net IO Since Admission: 16,607.54 mL [05/07/22 0954]  Filed Weights   05/05/22 0500 05/06/22 0500 05/07/22 0359  Weight: 51.6 kg 53.1 kg 57.7 kg    Examination:  General - alert Eyes - pupils reactive ENT - no sinus tenderness, no stridor Cardiac - regular rate/rhythm, 2/6 SM Chest - equal breath sounds b/l, no wheezing or rales Abdomen - soft, non  tender, + bowel sounds Extremities - 1+ edema Skin - no rashes Neuro - normal strength, moves extremities, follows commands Psych - normal mood and behavior  Resolved Hospital Problem list   Anion gap metabolic acidosis with lactic acidosis, Starvation ketosis, Sepsis ruled out  Assessment & Plan:    Hypotension from hypovolemia. - improved - KVO IV fluids - continue midodrine  Possible adrenal insufficiency. - will need MRI brain at some point to rule out pituitary lesion >> can be done as outpt - change solu cortef to 100 mg bid on 10/12  Moderate AI. - seen by cardiology 05/05/22  Transient sinus bradycardia with pauses. - possibly from vagal response in setting of nausea/vomiting or reaction to phenergan - monitor on telemetry  Nausea with vomiting. Esophageal candidiasis. Hiatal hernia. Severe protein calorie malnutrition. - GI consulted - diflucan 400 mg once, then 200 mg daily for next 13 days - advance diet  AKI from hypovolemia. Non gap metabolic acidosis. - f/u BMET - continue bicarbonate tablets for now  Hx of seizures. - continue keppra  Reported hx of bipolar. - psychiatry consulted 10/10, and questioning dx of bipolar - more concern for PTSD and borderline personality - okay to transition from buspar to remeron >> defer to psychiatry  Chronic pain. - prn analgesics  Vitamin D deficiency. - continue supplement  Deconditioning. - PT/OT  Transfer to medical telemetry 10/12.  Will ask [facility] to assume care from 10/13 and PCCM off.  Best Practice (right click and "Reselect all SmartList Selections" daily)   Diet/type: Regular consistency (see orders) DVT prophylaxis: prophylactic heparin  GI prophylaxis: PPI Lines: N/A Foley:  N/A Code Status:  full code Last date of multidisciplinary goals of care discussion [pending]  Labs:      Latest Ref Rng & Units 05/07/2022    3:47 AM 05/06/2022   10:22 AM 05/05/2022    4:51 AM  CMP  Glucose 70 - 99 mg/dL 98  108  114   BUN 6 - 20 mg/dL 27  33  36   Creatinine 0.44 - 1.00 mg/dL 1.43  1.53  1.60   Sodium 135 - 145 mmol/L 138  136  135   Potassium 3.5 - 5.1 mmol/L 4.2  4.1  3.8   Chloride 98 - 111 mmol/L 110  113  111   CO2 22 - 32 mmol/L 18  16  12    Calcium 8.9 - 10.3 mg/dL 7.9  7.3  7.2    Total Protein 6.5 - 8.1 g/dL 4.5  4.7    Total Bilirubin 0.3 - 1.2 mg/dL 0.3  0.5    Alkaline Phos 38 - 126 U/L 180  178    AST 15 - 41 U/L 178  220    ALT 0 - 44 U/L 139  124         Latest Ref Rng & Units 05/04/2022    4:55 AM 05/03/2022    9:17 AM 05/02/2022    6:04 AM  CBC  WBC 4.0 - 10.5 K/uL 8.6  13.5  11.4   Hemoglobin 12.0 - 15.0 g/dL 10.0  10.0  9.7   Hematocrit 36.0 - 46.0 % 28.1  27.4  26.0   Platelets 150 - 400 K/uL 474  443  410    CBG (last 3)  Recent Labs    05/06/22 1537 05/06/22 1955 05/07/22 0833  GLUCAP 93 105* 92    Signature:  Chesley Mires, MD [place] Pager - [phone]  [phone] 05/07/2022, 10:06 AM

## 2022-05-07 NOTE — Progress Notes (Signed)
Pt transfer from 3MW, A&0 x4, pt wants blinds down light off. She is c/o been hungry,called service respond replace her food order, waiting for it to be sent. Pt work with therapy and is requesting for gas relief,place on telemetry. Call button, phone in place, and bed in its lowest position.

## 2022-05-07 NOTE — Op Note (Signed)
[facility] Patient Name: Jamie Shields Procedure Date : 05/07/2022 MRN: [id number] Attending MD: Willaim Rayas. Adela Lank , MD Date of Birth: Dec 12, 1980 CSN: [id number] Age: 41 Admit Type: Inpatient Procedure:                Upper GI endoscopy Indications:              Nausea with vomiting, weight loss, on PPI and                            steroids for possible adrenal insufficiency Providers:                Willaim Rayas. Adela Lank, MD, Adolph Pollack, RN,                            Irene Shipper, Technician Referring MD:              Medicines:                Monitored Anesthesia Care Complications:            No immediate complications. Estimated blood loss:                            Minimal. Estimated Blood Loss:     Estimated blood loss was minimal. Procedure:                Pre-Anesthesia Assessment:                           - Prior to the procedure, a History and Physical                            was performed, and patient medications and                            allergies were reviewed. The patient's tolerance of                            previous anesthesia was also reviewed. The risks                            and benefits of the procedure and the sedation                            options and risks were discussed with the patient.                            All questions were answered, and informed consent                            was obtained. Prior Anticoagulants: The patient has                            taken no previous anticoagulant or antiplatelet  agents. ASA Grade Assessment: III - A patient with                            severe systemic disease. After reviewing the risks                            and benefits, the patient was deemed in                            satisfactory condition to undergo the procedure.                           After obtaining informed consent, the endoscope was                             passed under direct vision. Throughout the                            procedure, the patient's blood pressure, pulse, and                            oxygen saturations were monitored continuously. The                            [id number]-[id number] ([id number]) Olympus endoscope was introduced                            through the mouth, and advanced to the second part                            of duodenum. The upper GI endoscopy was                            accomplished without difficulty. The patient                            tolerated the procedure well. Scope In: Scope Out: Findings:      Esophagogastric landmarks were identified: the Z-line was found at 38       cm, the gastroesophageal junction was found at 38 cm and the upper       extent of the gastric folds was found at 41 cm from the incisors.      A 3 cm hiatal hernia was present.      Patchy, white plaques were found in the middle third of the esophagus       and in the lower third of the esophagus, grossly consistent with       esophageal candidiasis.      The exam of the esophagus was otherwise normal.      The entire examined stomach was normal. Biopsies were taken with a cold       forceps for Helicobacter pylori testing.      The examined duodenum was normal. Biopsies for histology were taken with       a cold forceps for evaluation of celiac disease. Impression:               -  Esophagogastric landmarks identified.                           - 3 cm hiatal hernia.                           - Esophageal plaques were found, consistent with                            candidiasis.                           - Normal esophagus otherwise                           - Normal stomach. Biopsied.                           - Normal examined duodenum. Biopsied.                           Esophageal candidiasis noted, could be result of                            ongoing steroid use, but can cause nausea and                            recommend  treatment. Transaminitis noted yesterday                            and today which is new. She has had hypotension                            could be recovering from shock liver? Would further                            evaluate with serologies and RUQ and trend Recommendation:           - Return patient to ICU for ongoing care.                           - Advance diet as tolerated.                           - Continue present medications.                           - Start fluconazole 400mg  PO x dose today, then                            200mg  / day for another 13 days                           - Trial of Reglan if she has not had that yet                           -  Await pathology results.                           - RUQ [place] today                           - viral hepatitis panel, trend LFTs                           - GI inpatient service will reassess tomorrow Procedure Code(s):        --- Professional ---                           [phone], Esophagogastroduodenoscopy, flexible,                            transoral; with biopsy, single or multiple Diagnosis Code(s):        --- Professional ---                           K44.9, Diaphragmatic hernia without obstruction or                            gangrene                           K22.9, Disease of esophagus, unspecified                           R11.2, Nausea with vomiting, unspecified CPT copyright 2019 [employer] Medical Association. All rights reserved. The codes documented in this report are preliminary and upon coder review may  be revised to meet current compliance requirements. Viviann Spare P. Jocelyne Reinertsen, MD 05/07/2022 8:08:52 AM This report has been signed electronically. Number of Addenda: 0

## 2022-05-07 NOTE — Anesthesia Procedure Notes (Signed)
Procedure Name: MAC Date/Time: 05/07/2022 7:40 AM  Performed by: Lieutenant Diego, CRNAPre-anesthesia Checklist: Patient identified, Emergency Drugs available, Suction available, Patient being monitored and Timeout performed Patient Re-evaluated:Patient Re-evaluated prior to induction Oxygen Delivery Method: Nasal cannula Preoxygenation: Pre-oxygenation with 100% oxygen Induction Type: IV induction

## 2022-05-07 NOTE — Consult Note (Signed)
[facility] Psychiatry New Face-to-Face Psychiatric Evaluation   Service Date: May 07, 2022 LOS:  LOS: 6 days    Assessment  Jamie Shields is a 41 y.o. female admitted medically for 05/01/2022  3:58 PM for hypovolemic shock secondary to protein calorie malnutrition and likely adrenal insufficiency. She carries the psychiatric diagnoses of depression, alcohol use disorder, anxiety, PTSD and has a past medical history of  epilepsy.Psychiatry was consulted for management of anxiety by [name], NP.   No major changes.  Reports similarly anxious compared to yesterday.  Denies SI/HI/AVH.  Agreeable to starting Remeron 7.5 mg nightly tonight to help with anxiety as well as stimulate appetite and sedation.  Discussed side effects including increased risk for orthostatic hypotension.  Reports feeling mildly nauseated after upper endoscopy.  Diagnoses:  Active Hospital problems: Principal Problem:   Hypovolemic shock (HCC) Active Problems:   Severe dehydration   Nausea and vomiting   Disorders of fluid, electrolyte, and acid-base balance   Hyponatremia   AKI (acute kidney injury) (HCC)   Severe protein-calorie malnutrition (HCC)   Seizure disorder (HCC)   Bipolar disease, chronic (HCC)   Normal anion gap metabolic acidosis   DNR (do not resuscitate)   Adrenal insufficiency (HCC)   History of seizures   Loss of weight     Plan  ## Safety and Observation Level:  - Based on my clinical evaluation, I estimate the patient to be at minimal risk of self harm in the current setting  ## Medications:  --DISCONTINUE buspirone --START Mirtazapine 7.5 mg qhs for anxiety and added benefits of sedation and appetite stimulation  -BP 115/77 --Minimize benzodiazepine use given baseline anxiety --Encourage p.o. intake and mobilization as tolerated  ## Medical Decision Making Capacity:  Not formally assessed  ## Further Work-up:  -- per primary team -- most recent EKG on 05/06/22  had QtC of 478 -- Pertinent labwork reviewed earlier this admission includes: Cr 1.6>1.53, Folate 2.3, vitamin d 21.36, negative hCG, phos 1.9, mag 2.6, TSH 1.258  ## Disposition:  -- home once medically stable  ##Legal Status Patient is her own guardian  Thank you for this consult request. Recommendations have been communicated to the primary team.  We will continue to follow at this time.   [name], MD   History  Relevant Aspects of Hospital Course:  Admitted on 05/01/2022 for protein-calorie malnutrition, hypotension.  Patient Report:  Patient denies SI/HI/AVH. Reports anxiety relatively similar to yesterday. Mood unchanged. Agreeable to remeron. Discussed r/b/se and patient agreeable to medication trial.    Collateral information:  Unable to reach partner via telephone. Will attempt tomorrow.  Psychiatric History:  Information collected from patient PTSD, GAD, MDD Has not had therapy in the past. Denies psychiatric hospitalization.   Social History:  Tobacco use: denies Alcohol use: denies Drug use: denies  Family History:  The patient's family history includes Cancer in an other family member; Diabetes in an other family member; Hypertension in her mother.  Medical History: Past Medical History:  Diagnosis Date   Anxiety    Bipolar 1 disorder (HCC)    Bursitis of hip    Chronic bronchitis    Hip joint pain    Seizures (HCC)     Surgical History: Past Surgical History:  Procedure Laterality Date   CESAREAN SECTION     2002   KNEE ARTHROSCOPY     MOUTH SURGERY  2010    Medications:   Current Facility-Administered Medications:    0.9 %  sodium chloride infusion, 250 mL, Intravenous, Continuous, Hunsucker, Lesia Sago, MD, Stopped at 05/05/22 2130   acetaminophen (TYLENOL) tablet 650 mg, 650 mg, Oral, Q6H PRN, Coralyn Helling, MD   ALPRAZolam Prudy Feeler) tablet 0.25 mg, 0.25 mg, Oral, BID PRN, Simonne Martinet, NP   alum & mag hydroxide-simeth  (MAALOX/MYLANTA) 200-200-20 MG/5ML suspension 15 mL, 15 mL, Oral, Q6H PRN, Ogan, Okoronkwo U, MD, 15 mL at 05/06/22 0355   busPIRone (BUSPAR) tablet 10 mg, 10 mg, Oral, BID, Simonne Martinet, NP, 10 mg at 05/07/22 0935   Chlorhexidine Gluconate Cloth 2 % PADS 6 each, 6 each, Topical, Daily, Hunsucker, Lesia Sago, MD, 6 each at 05/06/22 2200   cholecalciferol (VITAMIN D3) 25 MCG (1000 UNIT) tablet 1,000 Units, 1,000 Units, Oral, Daily, Simonne Martinet, NP, 1,000 Units at 05/07/22 0935   docusate sodium (COLACE) capsule 100 mg, 100 mg, Oral, BID PRN, Hunsucker, Lesia Sago, MD   feeding supplement (ENSURE ENLIVE / ENSURE PLUS) liquid 237 mL, 237 mL, Oral, BID BM, Sood, Vineet, MD, 237 mL at 05/05/22 0944   folic acid (FOLVITE) tablet 1 mg, 1 mg, Oral, Daily, Sood, Vineet, MD, 1 mg at 05/07/22 0934   heparin injection 5,000 Units, 5,000 Units, Subcutaneous, Q8H, Hunsucker, Lesia Sago, MD, 5,000 Units at 05/06/22 2245   hydrocortisone sodium succinate (SOLU-CORTEF) 100 MG injection 100 mg, 100 mg, Intravenous, Q8H, Hunsucker, Lesia Sago, MD, 100 mg at 05/07/22 0501   lactated ringers infusion, , Intravenous, Continuous, Simonne Martinet, NP, Last Rate: 75 mL/hr at 05/07/22 0735, Restarted at 05/07/22 0747   levETIRAcetam (KEPPRA) tablet 500 mg, 500 mg, Oral, BID, Sood, Vineet, MD, 500 mg at 05/07/22 0935   lidocaine (XYLOCAINE) 2 % jelly 1 Application, 1 Application, Topical, Once PRN, Coralyn Helling, MD   magnesium sulfate IVPB 2 g 50 mL, 2 g, Intravenous, Once, Bell, Lorin C, RPH, Last Rate: 50 mL/hr at 05/07/22 0905, 2 g at 05/07/22 0905   melatonin tablet 3 mg, 3 mg, Oral, QHS PRN, Karl Ito, MD, 3 mg at 05/02/22 2255   midodrine (PROAMATINE) tablet 15 mg, 15 mg, Oral, TID WC, Simonne Martinet, NP, 15 mg at 05/06/22 1259   morphine (PF) 2 MG/ML injection 2 mg, 2 mg, Intravenous, Q3H PRN, Coralyn Helling, MD, 2 mg at 05/07/22 4132   multivitamin with minerals tablet 1 tablet, 1 tablet, Oral, Daily,  Sood, Vineet, MD, 1 tablet at 05/07/22 0935   norepinephrine (LEVOPHED) 16 mg in (0.064 mg/mL) premix infusion, 0-40 mcg/min, Intravenous, Titrated, Simonne Martinet, NP, Stopped at 05/06/22 1023   ondansetron (ZOFRAN) tablet 4 mg, 4 mg, Oral, Q8H PRN, Simonne Martinet, NP, 4 mg at 05/07/22 0857   Oral care mouth rinse, 15 mL, Mouth Rinse, PRN, Craige Cotta, Vineet, MD   pantoprazole (PROTONIX) EC tablet 80 mg, 80 mg, Oral, BID, [name] E, NP, 80 mg at 05/07/22 0934   polyethylene glycol (MIRALAX / GLYCOLAX) packet 17 g, 17 g, Oral, Daily PRN, Hunsucker, Lesia Sago, MD   sodium bicarbonate tablet 1,300 mg, 1,300 mg, Oral, BID, [name] E, NP, 1,300 mg at 05/07/22 0935   thiamine (VITAMIN B1) tablet 100 mg, 100 mg, Oral, Daily, Craige Cotta, Vineet, MD, 100 mg at 05/07/22 0935  Allergies: Allergies  Allergen Reactions   Cucumber Extract Anaphylaxis   Keflex [Cephalexin] Nausea And Vomiting   Macrobid [Nitrofurantoin Monohydrate Macrocrystals] Hives and Swelling   Naproxen Nausea And Vomiting   Penicillins Nausea And Vomiting    Heavy  vomitting Did PCN reaction causing immediate rash, facial/tongue/throat swelling, SOB or lightheadedness with hypotension: No swelling, but im not sure if i got a rash as i was red all over already Did PCN reaction causing severe rash involving mucus membranes or skin necrosis: No Did a PCN reaction that required hospitalization Yes went to the ED Has patient had a PCN reaction occurring within the last 10 years: No-childhood allergy If all of the above answers are "NO", then may proceed with   Tramadol Nausea And Vomiting   Watermelon Concentrate Diarrhea   Darvocet [Propoxyphene N-Acetaminophen] Nausea And Vomiting and Other (See Comments)    migraines   Flagyl [Metronidazole Hcl] Hives, Swelling and Rash   Ketorolac Nausea And Vomiting and Other (See Comments)    migraines   Ultram [Tramadol Hcl] Nausea And Vomiting and Other (See Comments)     migraines   Vicodin [Hydrocodone-Acetaminophen] Nausea And Vomiting and Other (See Comments)    migraines       Objective  Vital signs:  Temp:  [97.3 F (36.3 C)-98.7 F (37.1 C)] 97.3 F (36.3 C) (10/12 0835) Pulse Rate:  [53-96] 59 (10/12 0930) Resp:  [8-26] 12 (10/12 0930) BP: (89-134)/(52-119) 115/77 (10/12 0930) SpO2:  [94 %-100 %] 99 % (10/12 0930) Weight:  [57.7 kg] 57.7 kg (10/12 0359)  Psychiatric Specialty Exam:  Presentation  General Appearance: Appropriate for Environment; Casual Frail, cachectic caucasian female that appears older than stated age  Eye Contact:Fair  Speech:Slow (significant latency in response)  Speech Volume:Decreased  Handedness:No data recorded  Mood and Affect  Mood:Anxious  Affect:Depressed   Thought Process  Thought Processes:Coherent; Goal Directed  Descriptions of Associations:Circumstantial  Orientation:Partial  Thought Content:Scattered  History of Schizophrenia/Schizoaffective disorder:No data recorded Duration of Psychotic Symptoms:No data recorded Hallucinations:Hallucinations: None  Ideas of Reference:None  Suicidal Thoughts:Suicidal Thoughts: No  Homicidal Thoughts:Homicidal Thoughts: No   Sensorium  Memory:Immediate Fair; Recent Fair; Remote Poor  Judgment:Impaired  Insight:Lacking   Executive Functions  Concentration:Poor  Attention Span:Poor  Recall:Poor  Fund of Knowledge:Fair  Language:Fair   Psychomotor Activity  Psychomotor Activity:Psychomotor Activity: Psychomotor Retardation   Assets  Assets:Communication Skills; Desire for Improvement; Intimacy; Social Support   Sleep  Sleep:Sleep: Fair    Physical Exam: Physical Exam ROS Blood pressure 115/77, pulse (!) 59, temperature (!) 97.3 F (36.3 C), temperature source Oral, resp. rate 12, height 5\' 6"  (1.676 m), weight 57.7 kg, SpO2 99 %. Body mass index is 20.53 kg/m.

## 2022-05-07 NOTE — Transfer of Care (Signed)
Immediate Anesthesia Transfer of Care Note  Patient: Kalicia L Cofield  Procedure(s) Performed: ESOPHAGOGASTRODUODENOSCOPY (EGD) WITH PROPOFOL BIOPSY  Patient Location: PACU  Anesthesia Type:MAC  Level of Consciousness: drowsy  Airway & Oxygen Therapy: Patient Spontanous Breathing and Patient connected to nasal cannula oxygen  Post-op Assessment: Report given to RN and Post -op Vital signs reviewed and stable  Post vital signs: Reviewed and stable  Last Vitals:  Vitals Value Taken Time  BP 98/74 05/07/22 0800  Temp    Pulse 59 05/07/22 0801  Resp 12 05/07/22 0801  SpO2 98 % 05/07/22 0801  Vitals shown include unvalidated device data.  Last Pain:  Vitals:   05/07/22 0730  TempSrc: Temporal  PainSc: 0-No pain         Complications: No notable events documented.

## 2022-05-07 NOTE — Anesthesia Postprocedure Evaluation (Addendum)
Anesthesia Post Note  Patient: Jamie Shields  Procedure(s) Performed: ESOPHAGOGASTRODUODENOSCOPY (EGD) WITH PROPOFOL BIOPSY     Patient location during evaluation: PACU Anesthesia Type: MAC Level of consciousness: awake Pain management: pain level controlled Vital Signs Assessment: post-procedure vital signs reviewed and stable Respiratory status: spontaneous breathing Postop Assessment: no apparent nausea or vomiting Anesthetic complications: no   No notable events documented.  Last Vitals:  Vitals:   05/07/22 0815 05/07/22 0835  BP: 119/78 111/76  Pulse: 68 60  Resp: 15 10  Temp:    SpO2: 97% 97%    Last Pain:  Vitals:   05/07/22 0800  TempSrc:   PainSc: [name]

## 2022-05-07 NOTE — Progress Notes (Signed)
Physical Therapy Evaluation Patient Details Name: Jamie Shields MRN: [id number] DOB: [birth year] Today's Date: 05/07/2022  History of Present Illness  41 year old chronically ill, malnourished female,  admitted to 10/6 w/ cc: Severe weakness, nausea and vomiting. Dx hypovolemic shock.  Clinical Impression  Pt admitted with above diagnosis. Patient demonstrates generalized weakness and mildly impaired balance, causing difficulty with ambulation. Improves with use of RW for support, however distance limited by LE fatigue during evaluation. Pt also reports unsteadiness on feet PTA however denies falls. Lives with significant other who works but can provide assistance as needed, per pt. Pt currently with functional limitations due to the deficits listed below (see PT Problem List). Pt will benefit from skilled PT to increase their independence and safety with mobility to allow discharge to the venue listed below.          Recommendations for follow up therapy are one component of a multi-disciplinary discharge planning process, led by the attending physician.  Recommendations may be updated based on patient status, additional functional criteria and insurance authorization.  Follow Up Recommendations Outpatient PT      Assistance Recommended at Discharge Intermittent Supervision/Assistance  Patient can return home with the following  A little help with walking and/or transfers;A little help with bathing/dressing/bathroom;Assistance with cooking/housework;Assist for transportation;Help with stairs or ramp for entrance    Equipment Recommendations Rolling walker (2 wheels)  Recommendations for Other Services       Functional Status Assessment Patient has had a recent decline in their functional status and demonstrates the ability to make significant improvements in function in a reasonable and predictable amount of time.     Precautions / Restrictions Precautions Precautions:  None Restrictions Weight Bearing Restrictions: No      Mobility  Bed Mobility Overal bed mobility: Needs Assistance Bed Mobility: Supine to Sit, Sit to Supine     Supine to sit: Supervision Sit to supine: Supervision   General bed mobility comments: supervision for safety, extra time, no physical assist.    Transfers Overall transfer level: Needs assistance Equipment used: Rolling walker (2 wheels) Transfers: Sit to/from Stand Sit to Stand: Min guard           General transfer comment: Min guard for safety, cues for technique, unsteady upon rising but improves with use of RW for support.    Ambulation/Gait Ambulation/Gait assistance: Min guard Gait Distance (Feet): 14 Feet Assistive device: Rolling walker (2 wheels) Gait Pattern/deviations: Step-through pattern, Shuffle, Decreased stride length Gait velocity: slow Gait velocity interpretation: <1.31 ft/sec, indicative of household ambulator   General Gait Details: Slow and cautious, min guard for safety, using RW for support with cues for correct use. Good control of RW. Educated on safety, turning with RW to reach for her bags and prevent reaching beyond LOS - pt verb understanding. LEs fatigue preventing progressive distance.  Stairs            Wheelchair Mobility    Modified Rankin (Stroke Patients Only)       Balance Overall balance assessment: Needs assistance Sitting-balance support: No upper extremity supported, Feet supported Sitting balance-Leahy Scale: Good     Standing balance support: No upper extremity supported, During functional activity Standing balance-Leahy Scale: Fair Standing balance comment: Somewhat unsteady, reaching for furniture. Improves with UE support on RW.                             Pertinent Vitals/Pain Pain Assessment  Pain Assessment: 0-10 Pain Score:  ("A little") Pain Location: knees BIL Pain Descriptors / Indicators: Aching Pain Intervention(s):  Monitored during session, Repositioned    Home Living Family/patient expects to be discharged to:: Private residence Living Arrangements: Spouse/significant other Available Help at Discharge: Family;Available PRN/intermittently Type of Home: Other(Comment) ("Camper") Home Access: Stairs to enter Entrance Stairs-Rails: Right Entrance Stairs-Number of Steps: 2-3   Home Layout: One level Home Equipment: Grab bars - tub/shower      Prior Function Prior Level of Function : Independent/Modified Independent;Working/employed (Works at [employer])             Mobility Comments: No issues with mobility prior to becoming unwell       Hand Dominance   Dominant Hand: Right    Extremity/Trunk Assessment   Upper Extremity Assessment Upper Extremity Assessment: Defer to OT evaluation    Lower Extremity Assessment Lower Extremity Assessment: Generalized weakness (BIL edematous)       Communication   Communication: No difficulties  Cognition Arousal/Alertness: Awake/alert Behavior During Therapy: WFL for tasks assessed/performed Overall Cognitive Status: Within Functional Limits for tasks assessed                                          General Comments      Exercises     Assessment/Plan    PT Assessment Patient needs continued PT services  PT Problem List Decreased strength;Decreased activity tolerance;Decreased balance;Decreased mobility;Decreased knowledge of use of DME;Pain       PT Treatment Interventions DME instruction;Gait training;Stair training;Functional mobility training;Therapeutic activities;Therapeutic exercise;Balance training;Neuromuscular re-education;Patient/family education    PT Goals (Current goals can be found in the Care Plan section)  Acute Rehab PT Goals Patient Stated Goal: Get well, go home PT Goal Formulation: With patient Time For Goal Achievement: 05/14/22 Potential to Achieve Goals: Good    Frequency Min  3X/week     Co-evaluation               AM-PAC PT "6 Clicks" Mobility  Outcome Measure Help needed turning from your back to your side while in a flat bed without using bedrails?: None Help needed moving from lying on your back to sitting on the side of a flat bed without using bedrails?: None Help needed moving to and from a bed to a chair (including a wheelchair)?: A Little Help needed standing up from a chair using your arms (e.g., wheelchair or bedside chair)?: A Little Help needed to walk in hospital room?: A Little Help needed climbing 3-5 steps with a railing? : A Lot 6 Click Score: 19    End of Session Equipment Utilized During Treatment: Gait belt Activity Tolerance: Patient limited by fatigue Patient left: in bed;with call bell/phone within reach Nurse Communication: Other (comment) (requests "medication for heart burn") PT Visit Diagnosis: Unsteadiness on feet (R26.81);Other abnormalities of gait and mobility (R26.89);Muscle weakness (generalized) (M62.81);Difficulty in walking, not elsewhere classified (R26.2)    Time: 1538-1600 PT Time Calculation (min) (ACUTE ONLY): 22 min   Charges:   PT Evaluation $PT Eval Low Complexity: [place], PT, DPT Physical Therapist Acute Rehabilitation Services [place]   Ellouise Newer 05/07/2022, 4:22 PM

## 2022-05-08 ENCOUNTER — Encounter (HOSPITAL_COMMUNITY): Payer: Self-pay | Admitting: Pulmonary Disease

## 2022-05-08 DIAGNOSIS — R571 Hypovolemic shock: Secondary | ICD-10-CM | POA: Diagnosis not present

## 2022-05-08 LAB — CBC
HCT: 24.7 % — ABNORMAL LOW (ref 36.0–46.0)
Hemoglobin: 8.3 g/dL — ABNORMAL LOW (ref 12.0–15.0)
MCH: 33.2 pg (ref 26.0–34.0)
MCHC: 33.6 g/dL (ref 30.0–36.0)
MCV: 98.8 fL (ref 80.0–100.0)
Platelets: 287 10*3/uL (ref 150–400)
RBC: 2.5 MIL/uL — ABNORMAL LOW (ref 3.87–5.11)
RDW: 19 % — ABNORMAL HIGH (ref 11.5–15.5)
WBC: 10.2 10*3/uL (ref 4.0–10.5)
nRBC: 0.2 % (ref 0.0–0.2)

## 2022-05-08 LAB — COMPREHENSIVE METABOLIC PANEL
ALT: 169 U/L — ABNORMAL HIGH (ref 0–44)
AST: 179 U/L — ABNORMAL HIGH (ref 15–41)
Albumin: 2.7 g/dL — ABNORMAL LOW (ref 3.5–5.0)
Alkaline Phosphatase: 209 U/L — ABNORMAL HIGH (ref 38–126)
Anion gap: 8 (ref 5–15)
BUN: 27 mg/dL — ABNORMAL HIGH (ref 6–20)
CO2: 18 mmol/L — ABNORMAL LOW (ref 22–32)
Calcium: 7.9 mg/dL — ABNORMAL LOW (ref 8.9–10.3)
Chloride: 113 mmol/L — ABNORMAL HIGH (ref 98–111)
Creatinine, Ser: 1.52 mg/dL — ABNORMAL HIGH (ref 0.44–1.00)
GFR, Estimated: 44 mL/min — ABNORMAL LOW (ref >60)
Glucose, Bld: 107 mg/dL — ABNORMAL HIGH (ref 70–99)
Potassium: 4.1 mmol/L (ref 3.5–5.1)
Sodium: 139 mmol/L (ref 135–145)
Total Bilirubin: 0.5 mg/dL (ref 0.3–1.2)
Total Protein: 4.9 g/dL — ABNORMAL LOW (ref 6.5–8.1)

## 2022-05-08 LAB — HCV INTERPRETATION

## 2022-05-08 LAB — VITAMIN A: Vitamin A (Retinoic Acid): 16.9 ug/dL — ABNORMAL LOW (ref 20.1–62.0)

## 2022-05-08 LAB — PHOSPHORUS: Phosphorus: 1.5 mg/dL — ABNORMAL LOW (ref 2.5–4.6)

## 2022-05-08 LAB — SURGICAL PATHOLOGY

## 2022-05-08 LAB — VITAMIN E
Vitamin E (Alpha Tocopherol): 18.1 mg/L (ref 7.0–25.1)
Vitamin E(Gamma Tocopherol): 2 mg/L (ref 0.5–5.5)

## 2022-05-08 LAB — MAGNESIUM: Magnesium: 2 mg/dL (ref 1.7–2.4)

## 2022-05-08 MED ORDER — HYDROXYZINE HCL 10 MG PO TABS
10.0000 mg | ORAL_TABLET | Freq: Three times a day (TID) | ORAL | Status: DC | PRN
  Administered 2022-05-08 – 2022-05-14 (×13): 10 mg via ORAL
  Filled 2022-05-08 (×14): qty 1

## 2022-05-08 MED ORDER — SODIUM PHOSPHATES 45 MMOLE/15ML IV SOLN
15.0000 mmol | Freq: Once | INTRAVENOUS | Status: AC
  Administered 2022-05-08: 15 mmol via INTRAVENOUS
  Filled 2022-05-08: qty 5

## 2022-05-08 NOTE — TOC Initial Note (Signed)
Transition of Care [facility]) - Initial/Assessment Note    Patient Details  Name: Jamie Shields MRN: [id number] Date of Birth: 01-14-1981  Transition of Care [facility]) CM/SW Contact:    Marilu Favre, RN Phone Number: 05/08/2022, 9:59 AM  Clinical Narrative:                 Spoke to patient at bedside. Discussed OP PT and locations. Patient preferred [facility] location. Order entered and information placed on AVS.   PT also recommending a walker. Patient declining walker, states she lives in a camper and "a walker would not work".   Lives with significant other   Expected Discharge Plan: Home/Self Care Barriers to Discharge: Continued Medical Work up   Patient Goals and CMS Choice Patient states their goals for this hospitalization and ongoing recovery are:: to return to home CMS Medicare.gov Compare Post Acute Care list provided to:: Patient Choice offered to / list presented to : Patient  Expected Discharge Plan and Services Expected Discharge Plan: Home/Self Care   Discharge Planning Services: CM Consult Post Acute Care Choice:  (outpatient rehab) Living arrangements for the past 2 months:  (camper)                 DME Arranged:  (patient declined walker)         HH Arranged: NA          Prior Living Arrangements/Services Living arrangements for the past 2 months:  (camper) Lives with:: Significant Other Patient language and need for interpreter reviewed:: Yes Do you feel safe going back to the place where you live?: Yes      Need for Family Participation in Patient Care: Yes (Comment) Care giver support system in place?: Yes (comment)   Criminal Activity/Legal Involvement Pertinent to Current Situation/Hospitalization: No - Comment as needed  Activities of Daily Living      Permission Sought/Granted   Permission granted to share information with : No              Emotional Assessment Appearance:: Appears stated age Attitude/Demeanor/Rapport:  Engaged Affect (typically observed): Accepting Orientation: : Oriented to Self, Oriented to Place, Oriented to  Time, Oriented to Situation Alcohol / Substance Use: Not Applicable Psych Involvement: No (comment)  Admission diagnosis:  Renal failure [N19] Patient Active Problem List   Diagnosis Date Noted   Loss of weight 05/06/2022   Normal anion gap metabolic acidosis 82/42/3536   DNR (do not resuscitate) 05/05/2022   Adrenal insufficiency ([place]) 05/05/2022   History of seizures 05/05/2022   Seizure disorder ([place]) 05/04/2022   Bipolar disease, chronic ([place]) 05/04/2022   Hypovolemic shock ([place]) 05/01/2022   Severe dehydration 05/01/2022   Nausea and vomiting 05/01/2022   Disorders of fluid, electrolyte, and acid-base balance 05/01/2022   Hyponatremia 05/01/2022   AKI (acute kidney injury) ([place]) 05/01/2022   Severe protein-calorie malnutrition ([place]) 05/01/2022   Bursitis of hip    PCP:  Nolene Ebbs, MD Pharmacy:   [facility] [phone] [address], [address] AT [facility] [address] [place] [address] Phone: [phone] Fax: [place], [place] [address] AT [facility] OF [address] [place] [address] Phone: [phone] Fax: [phone]  [place], [place] [address] Phone: [phone] Fax: [phone]  Social Determinants of Health (SDOH) Interventions    Readmission Risk Interventions     No data to display           

## 2022-05-08 NOTE — Consult Note (Signed)
[facility] Psychiatry New Face-to-Face Psychiatric Evaluation   Service Date: May 08, 2022 LOS:  LOS: 7 days    Assessment  Jamie Shields is a 41 y.o. female admitted medically for 05/01/2022  3:58 PM for hypovolemic shock secondary to protein calorie malnutrition and likely adrenal insufficiency. She carries the psychiatric diagnoses of depression, alcohol use disorder, anxiety, PTSD and has a past medical history of  epilepsy.Psychiatry was consulted for management of anxiety by [name], NP.   Has not required Xanax.  Continues to be somewhat anxious.  Started on hydroxyzine for anxiety.  Expresses desire to be referred for outpatient psychiatry therapy at [facility] behavioral center.  Diagnoses:  Active Hospital problems: Principal Problem:   Hypovolemic shock (HCC) Active Problems:   Severe dehydration   Nausea and vomiting   Disorders of fluid, electrolyte, and acid-base balance   Hyponatremia   AKI (acute kidney injury) (HCC)   Severe protein-calorie malnutrition (HCC)   Seizure disorder (HCC)   Bipolar disease, chronic (HCC)   Normal anion gap metabolic acidosis   DNR (do not resuscitate)   Adrenal insufficiency (HCC)   History of seizures   Loss of weight     Plan  ## Safety and Observation Level:  - Based on my clinical evaluation, I estimate the patient to be at minimal risk of self harm in the current setting  ## Medications:  --Continue Mirtazapine 7.5 mg qhs for anxiety and added benefits of sedation and appetite stimulation  -BP 115/77 --Start hydroxyzine 10 mg tid prn for anxiety, can be titrated up over weekend for better management of anxiety --Encourage p.o. intake and mobilization as tolerated  ## Medical Decision Making Capacity:  Not formally assessed  ## Further Work-up:  -- per primary team -- most recent EKG on 05/06/22 had QtC of 478 -- Pertinent labwork reviewed earlier this admission includes: Cr 1.6>1.53, Folate 2.3,  vitamin d 21.36, negative hCG, phos 1.9, mag 2.6, TSH 1.258  ## Disposition:  -- home once medically stable  ##Legal Status Patient is her own guardian  Thank you for this consult request. Recommendations have been communicated to the primary team.  We will continue to follow at this time.   [name], MD   History  Relevant Aspects of Hospital Course:  Admitted on 05/01/2022 for protein-calorie malnutrition, hypotension.  Patient Report:  Reports feeling overall less anxious today.  However, this morning patient did express some dysphagia with liquids which caused increased anxiety.  Patient also endorsed that she had increased frustration given she was put n.p.o. which was then discontinued and then put back on n.p.o. multiple times today.  Discussed that it seemed like they were trying to do a HIDA scan or a gastric emptying study but there may have been multiple miscommunications throughout the day.  Patient verbalized understanding.  Continues to be frustrated by this.  Patient does report that she would greatly appreciate if her spouse could stay with her on the couch in the patient's room.  Discussed that she would need to speak with RN regarding the visitor options but likely would be of valid option for her.  Discussed starting hydroxyzine in lieu of benzodiazepine to manage anxiety.  Patient was amenable to this.  Discussed minimizing central acting agents in order to prevent any alterations in mental status.  Patient understood and is currently requesting outpatient referral for psychotherapy.  Discussed she can be seen at the [facility] for which she was  amenable to.  Patient also wanted to be referred to me for outpatient psychiatry and medication management.  Referral process has been placed.  Denies SI/HI/AVH.    Collateral information:  Unable to reach partner via telephone. Will attempt tomorrow.  Psychiatric History:  Information collected  from patient PTSD, GAD, MDD Has not had therapy in the past. Denies psychiatric hospitalization.   Social History:  Tobacco use: denies Alcohol use: denies Drug use: denies  Family History:  The patient's family history includes Cancer in an other family member; Diabetes in an other family member; Hypertension in her mother.  Medical History: Past Medical History:  Diagnosis Date   Anxiety    Bipolar 1 disorder (HCC)    Bursitis of hip    Chronic bronchitis    Hip joint pain    Seizures (HCC)     Surgical History: Past Surgical History:  Procedure Laterality Date   CESAREAN SECTION     2002   KNEE ARTHROSCOPY     MOUTH SURGERY  2010    Medications:   Current Facility-Administered Medications:    0.9 %  sodium chloride infusion, 250 mL, Intravenous, Continuous, Coralyn Helling, MD, Stopped at 05/05/22 2130   acetaminophen (TYLENOL) tablet 650 mg, 650 mg, Oral, Q6H PRN, Coralyn Helling, MD, 650 mg at 05/08/22 0702   alum & mag hydroxide-simeth (MAALOX/MYLANTA) 200-200-20 MG/5ML suspension 15 mL, 15 mL, Oral, Q6H PRN, Craige Cotta, Vineet, MD, 15 mL at 05/08/22 1255   cholecalciferol (VITAMIN D3) 25 MCG (1000 UNIT) tablet 1,000 Units, 1,000 Units, Oral, Daily, Sood, Vineet, MD, 1,000 Units at 05/08/22 0956   diphenhydrAMINE (BENADRYL) injection 25 mg, 25 mg, Intravenous, Q6H PRN, Craige Cotta, Vineet, MD   docusate sodium (COLACE) capsule 100 mg, 100 mg, Oral, BID PRN, Coralyn Helling, MD   feeding supplement (ENSURE ENLIVE / ENSURE PLUS) liquid 237 mL, 237 mL, Oral, BID BM, Sood, Vineet, MD, 237 mL at 05/08/22 1254   [COMPLETED] fluconazole (DIFLUCAN) tablet 400 mg, 400 mg, Oral, Once, 400 mg at 05/07/22 1037 **FOLLOWED BY** fluconazole (DIFLUCAN) tablet 200 mg, 200 mg, Oral, Daily, Craige Cotta, Vineet, MD, 200 mg at 05/08/22 0955   folic acid (FOLVITE) tablet 1 mg, 1 mg, Oral, Daily, Sood, Vineet, MD, 1 mg at 05/08/22 0956   heparin injection 5,000 Units, 5,000 Units, Subcutaneous, Q8H, Sood, Vineet,  MD, 5,000 Units at 05/08/22 1254   hydrocortisone sodium succinate (SOLU-CORTEF) 100 MG injection 100 mg, 100 mg, Intravenous, Q12H, Sood, Vineet, MD, 100 mg at 05/08/22 1731   hydrOXYzine (ATARAX) tablet 10 mg, 10 mg, Oral, TID PRN, [name], MD   levETIRAcetam (KEPPRA) tablet 500 mg, 500 mg, Oral, BID, Craige Cotta, Vineet, MD, 500 mg at 05/08/22 0957   lidocaine (XYLOCAINE) 2 % jelly 1 Application, 1 Application, Topical, Once PRN, Coralyn Helling, MD   melatonin tablet 3 mg, 3 mg, Oral, QHS PRN, Coralyn Helling, MD, 3 mg at 05/02/22 2255   midodrine (PROAMATINE) tablet 15 mg, 15 mg, Oral, TID WC, Sood, Vineet, MD, 15 mg at 05/08/22 1731   mirtazapine (REMERON) tablet 7.5 mg, 7.5 mg, Oral, QHS, Sood, Vineet, MD, 7.5 mg at 05/07/22 2229   morphine (PF) 2 MG/ML injection 2 mg, 2 mg, Intravenous, Q3H PRN, Coralyn Helling, MD, 2 mg at 05/07/22 1325   multivitamin with minerals tablet 1 tablet, 1 tablet, Oral, Daily, Coralyn Helling, MD, 1 tablet at 05/08/22 0947   ondansetron (ZOFRAN) tablet 4 mg, 4 mg, Oral, Q8H PRN, Coralyn Helling, MD, 4 mg at 05/07/22 [phone]  Oral care mouth rinse, 15 mL, Mouth Rinse, PRN, Halford Chessman, Vineet, MD   pantoprazole (PROTONIX) EC tablet 80 mg, 80 mg, Oral, BID, Halford Chessman, Vineet, MD, 80 mg at 05/08/22 0954   polyethylene glycol (MIRALAX / GLYCOLAX) packet 17 g, 17 g, Oral, Daily PRN, Halford Chessman, Vineet, MD   sodium bicarbonate tablet 1,300 mg, 1,300 mg, Oral, BID, Halford Chessman, Vineet, MD, 1,300 mg at 05/08/22 0957   thiamine (VITAMIN B1) tablet 100 mg, 100 mg, Oral, Daily, Chesley Mires, MD, 100 mg at 05/08/22 0956  Allergies: Allergies  Allergen Reactions   Cucumber Extract Anaphylaxis   Keflex [Cephalexin] Nausea And Vomiting   Macrobid [Nitrofurantoin Monohydrate Macrocrystals] Hives and Swelling   Naproxen Nausea And Vomiting   Penicillins Nausea And Vomiting    Heavy vomitting Did PCN reaction causing immediate rash, facial/tongue/throat swelling, SOB or lightheadedness with hypotension: No swelling,  but im not sure if i got a rash as i was red all over already Did PCN reaction causing severe rash involving mucus membranes or skin necrosis: No Did a PCN reaction that required hospitalization Yes went to the ED Has patient had a PCN reaction occurring within the last 10 years: No-childhood allergy If all of the above answers are "NO", then may proceed with   Tramadol Nausea And Vomiting   Watermelon Concentrate Diarrhea   Darvocet [Propoxyphene N-Acetaminophen] Nausea And Vomiting and Other (See Comments)    migraines   Flagyl [Metronidazole Hcl] Hives, Swelling and Rash   Ketorolac Nausea And Vomiting and Other (See Comments)    migraines   Ultram [Tramadol Hcl] Nausea And Vomiting and Other (See Comments)    migraines   Vicodin [Hydrocodone-Acetaminophen] Nausea And Vomiting and Other (See Comments)    migraines       Objective  Vital signs:  Temp:  [98.1 F (36.7 C)-98.6 F (37 C)] 98.5 F (36.9 C) (10/13 1758) Pulse Rate:  [54-77] 63 (10/13 1758) Resp:  [16-18] 17 (10/13 1758) BP: (117-134)/(77-83) 133/79 (10/13 1758) SpO2:  [93 %-100 %] 94 % (10/13 1758)  Psychiatric Specialty Exam:  Presentation  General Appearance: Appropriate for Environment; Casual Frail, cachectic caucasian female that appears older than stated age  Eye Contact:Fair  Speech:Clear and Coherent; Normal Rate  Speech Volume:Normal  Handedness:Right   Mood and Affect  Mood:Euthymic  Affect:Appropriate; Congruent   Thought Process  Thought Processes:Coherent; Goal Directed; Linear  Descriptions of Associations:Intact  Orientation:Full (Time, Place and Person)  Thought Content:Logical  History of Schizophrenia/Schizoaffective disorder:No data recorded Duration of Psychotic Symptoms:No data recorded Hallucinations:Hallucinations: None   Ideas of Reference:None  Suicidal Thoughts:Suicidal Thoughts: No   Homicidal Thoughts:Homicidal Thoughts: No    Sensorium   Memory:Immediate Fair; Recent Fair; Remote Fair  Judgment:Impaired  Insight:Fair   Executive Functions  Concentration:Fair  Attention Span:Fair  [place]  Language:Good   Psychomotor Activity  Psychomotor Activity:Psychomotor Activity: Normal    Assets  Assets:Communication Skills; Desire for Improvement; Physical Health   Sleep  Sleep:Sleep: Fair     Physical Exam: Physical Exam Vitals and nursing note reviewed.  Constitutional:      Appearance: Normal appearance. She is normal weight.  HENT:     Head: Normocephalic and atraumatic.  Pulmonary:     Effort: Pulmonary effort is normal.  Neurological:     General: No focal deficit present.     Mental Status: She is oriented to person, place, and time.    Review of Systems  Respiratory:  Negative for shortness of breath.  Cardiovascular:  Negative for chest pain.  Gastrointestinal:  Negative for abdominal pain, constipation, diarrhea, heartburn, nausea and vomiting.  Neurological:  Negative for headaches.   Blood pressure 133/79, pulse 63, temperature 98.5 F (36.9 C), temperature source Oral, resp. rate 17, height 5\' 6"  (1.676 m), weight 57.7 kg, SpO2 94 %. Body mass index is 20.53 kg/m.

## 2022-05-08 NOTE — Evaluation (Signed)
Occupational Therapy Evaluation Patient Details Name: Jamie Shields MRN: [id number] DOB: [birth year] Today's Date: 05/08/2022   History of Present Illness 41 year old chronically ill, malnourished female,  admitted to 10/6 w/ cc: Severe weakness, nausea and vomiting. Dx hypovolemic shock.   Clinical Impression   Pt lives in a camper set up with 2-3 steps to enter into the home. Pt reported they can not use a walker in the home due to size but educated about use outside the home as presents with decrease in standing balance with no UE support. Pt was able to complete UE/LB dressing post set up.  Pt currently with functional limitations due to the deficits listed below (see OT Problem List).  Pt will benefit from skilled OT to increase their safety and independence with ADL and functional mobility for ADL to facilitate discharge to venue listed below.        Recommendations for follow up therapy are one component of a multi-disciplinary discharge planning process, led by the attending physician.  Recommendations may be updated based on patient status, additional functional criteria and insurance authorization.   Follow Up Recommendations  No OT follow up    Assistance Recommended at Discharge Intermittent Supervision/Assistance  Patient can return home with the following Assistance with cooking/housework;Assist for transportation;A little help with bathing/dressing/bathroom;A little help with walking and/or transfers    Functional Status Assessment  Patient has had a recent decline in their functional status and demonstrates the ability to make significant improvements in function in a reasonable and predictable amount of time.  Equipment Recommendations  None recommended by OT (Pt was recomended to have a RW but reported they already declined to use at this time even though this therapist educated about saftey with mobility.)    Recommendations for Other Services       Precautions /  Restrictions Precautions Precautions: None Restrictions Weight Bearing Restrictions: No      Mobility Bed Mobility Overal bed mobility: Modified Independent             General bed mobility comments: HOB elevated but no assist    Transfers Overall transfer level: Needs assistance Equipment used: Rolling walker (2 wheels) Transfers: Sit to/from Stand Sit to Stand: Min guard                  Balance Overall balance assessment: Needs assistance Sitting-balance support: No upper extremity supported, Feet supported Sitting balance-Leahy Scale: Good     Standing balance support: No upper extremity supported, During functional activity Standing balance-Leahy Scale: Fair                             ADL either performed or assessed with clinical judgement   ADL Overall ADL's : Needs assistance/impaired Eating/Feeding: Independent;Sitting   Grooming: Wash/dry hands;Wash/dry face;Min guard;Standing   Upper Body Bathing: Set up;Sitting   Lower Body Bathing: Set up;Cueing for safety;Cueing for sequencing;Sit to/from stand   Upper Body Dressing : Set up;Sitting   Lower Body Dressing: Set up;Sit to/from stand   Toilet Transfer: Min guard;Cueing for safety;Cueing for sequencing   Toileting- Water quality scientist and Hygiene: Min guard;Cueing for safety;Cueing for sequencing;Sit to/from stand   Tub/ Shower Transfer: Min guard;Cueing for safety;Cueing for sequencing   Functional mobility during ADLs: Min guard;Cueing for safety;Cueing for sequencing;Rolling walker (2 wheels)       Vision         Perception     Praxis  Pertinent Vitals/Pain Pain Assessment Pain Assessment: Faces Faces Pain Scale: Hurts a little bit Pain Location: general soreness Pain Descriptors / Indicators: Aching Pain Intervention(s): Monitored during session     Hand Dominance Right   Extremity/Trunk Assessment Upper Extremity Assessment Upper Extremity  Assessment: Generalized weakness   Lower Extremity Assessment Lower Extremity Assessment: Defer to PT evaluation       Communication Communication Communication: No difficulties   Cognition Arousal/Alertness: Awake/alert Behavior During Therapy: WFL for tasks assessed/performed Overall Cognitive Status: Within Functional Limits for tasks assessed                                       General Comments       Exercises     Shoulder Instructions      Home Living Family/patient expects to be discharged to:: Private residence Living Arrangements: Spouse/significant other Available Help at Discharge: Family;Available PRN/intermittently Type of Home: Other(Comment) Home Access: Stairs to enter Entrance Stairs-Number of Steps: 2-3 Entrance Stairs-Rails: Right Home Layout: One level     Bathroom Shower/Tub: Tub/shower unit         Home Equipment: Grab bars - tub/shower;Shower seat - built in   Additional Comments: Pt lives in camper set up. Pt reports a clear area outside the home to walk onto. Per pt a walker will not fit inot the camper but can reach both walls.      Prior Functioning/Environment Prior Level of Function : Independent/Modified Independent;Working/employed             Mobility Comments: No issues with mobility prior to becoming unwell          OT Problem List: Decreased strength;Decreased activity tolerance;Impaired balance (sitting and/or standing);Decreased safety awareness;Decreased knowledge of use of DME or AE;Cardiopulmonary status limiting activity;Pain      OT Treatment/Interventions: Self-care/ADL training;DME and/or AE instruction;Therapeutic activities;Patient/family education;Balance training    OT Goals(Current goals can be found in the care plan section) Acute Rehab OT Goals Patient Stated Goal: to get stronger OT Goal Formulation: With patient Time For Goal Achievement: 05/22/22 Potential to Achieve Goals:  Good ADL Goals Pt Will Perform Tub/Shower Transfer: with modified independence;shower seat;ambulating Additional ADL Goal #1: Pt will be able to complete ADLs/IADLs with no BUE with no LOB  OT Frequency: Min 2X/week    Co-evaluation              AM-PAC OT "6 Clicks" Daily Activity     Outcome Measure Help from another person eating meals?: None Help from another person taking care of personal grooming?: None Help from another person toileting, which includes using toliet, bedpan, or urinal?: A Little Help from another person bathing (including washing, rinsing, drying)?: A Little Help from another person to put on and taking off regular upper body clothing?: None Help from another person to put on and taking off regular lower body clothing?: None 6 Click Score: 22   End of Session Equipment Utilized During Treatment: Gait belt;Rolling walker (2 wheels)  Activity Tolerance: Patient tolerated treatment well Patient left: in bed;with call bell/phone within reach;with bed alarm set  OT Visit Diagnosis: Muscle weakness (generalized) (M62.81);Other abnormalities of gait and mobility (R26.89);Unsteadiness on feet (R26.81);Pain Pain - Right/Left:  (general aching)                Time: 8184-0375 OT Time Calculation (min): 15 min Charges:  OT General Charges $OT Visit: 1  Visit OT Evaluation $OT Eval Low Complexity: [facility] OTR/L  Acute Rehab Services  [phone] office number [phone] pager number   Joeseph Amor 05/08/2022, 11:04 AM

## 2022-05-08 NOTE — Progress Notes (Addendum)
PROGRESS NOTE    Jamie Shields  [id number] DOB: Sep 02, 1980 DOA: 05/01/2022 PCP: Nolene Ebbs, MD     Brief Narrative:   41 year old woman with history of IV drug use now in remission , h/o seizure disorder on keppra, presents with 4 to 8 weeks of nausea vomiting and failure to thrive found to have hypotension, renal failure, metabolic derangements.  Subjective:  Reports difficulty swallowing Reports some generalized mild ab pain Reports chronic memory issues and anxiety, would like to have his fiance stay with her at the night   Assessment & Plan:  Principal Problem:   Hypovolemic shock ([place]) Active Problems:   Severe dehydration   Hyponatremia   AKI (acute kidney injury) ([place])   Nausea and vomiting   Disorders of fluid, electrolyte, and acid-base balance   Severe protein-calorie malnutrition ([facility])   Seizure disorder ([facility])   Bipolar disease, chronic ([facility]CC)   Normal anion gap metabolic acidosis   DNR (do not resuscitate)   Adrenal insufficiency ([facility])   History of seizures   Loss of weight  Hypovolemic shock vs adrenal insufficiency Off pressors Currently on midodrine and Solu-Medrol  AKI/metabolic acidosis Cr wnl in 2021 History of NSAIDs use Continue sodium bicarb supplement Cr trending down Bladder scan PVR 146cc, increase activity, prevent constipation, will discuss with her about flomax if does not improve  Hypophosphatemia Replace phos, recheck in the morning  LFT Lft was normal on presentation, lft elevated on recheck several days after admission  Shock liver ? Check ck /hepatitis Ab US showed acute  acalculous cholecystitis. Gi recommend gen surg consult GI following    Weight loss, 20 pounds over the past month, nausea vomiting for 2 months EGD showed esophageal candidiasis, on diflucan GI following   AI Seen by cardiology  History of seizure disorder Continue Keppra   Psychiatric disorders: Seen by psychiatry: "Doubt dx of bipolar  disease in chart (see note - no definite manic episodes, risk-taking behavior largely confined to substance use). Clear PTSD, many risk factors for BPD. Pt seems to benefit from therapeutic conversation/brief psychotherapy interventions" Start mirtazapine, stop buspirone Minimize benzodiazepine use given baseline anxiety    Nutritional Assessment: The patient's BMI is: Body mass index is 20.53 kg/m.Marland Kitchen Seen by dietician.  I agree with the assessment and plan as outlined below: Nutrition Status: Nutrition Problem: Severe Malnutrition Etiology: chronic illness (chronic N/V) Signs/Symptoms: severe muscle depletion, severe fat depletion Interventions: Ensure Enlive (each supplement provides 350kcal and 20 grams of protein), Tube feeding  .       I have Reviewed nursing notes, Vitals, pain scores, I/o's, Lab results and  imaging results since pt's last encounter, details please see discussion above  I ordered the following labs:  Unresulted Labs (From admission, onward)     Start     Ordered   05/09/22 0500  CBC with Differential/Platelet  Tomorrow morning,   R       Question:  Specimen collection method  Answer:  Unit=Unit collect   05/08/22 0735   05/09/22 0500  Reticulocytes  Tomorrow morning,   R       Question:  Specimen collection method  Answer:  Unit=Unit collect   05/08/22 0735   05/09/22 0500  Iron and TIBC  Tomorrow morning,   R       Question:  Specimen collection method  Answer:  Unit=Unit collect   05/08/22 0735   05/09/22 0500  Ferritin  Tomorrow morning,   R  Question:  Specimen collection method  Answer:  Unit=Unit collect   05/08/22 0735   05/09/22 0500  Comprehensive metabolic panel  Tomorrow morning,   R       Question:  Specimen collection method  Answer:  Unit=Unit collect   05/08/22 0735   05/09/22 0500  CK  Tomorrow morning,   R       Question:  Specimen collection method  Answer:  Unit=Unit collect   05/08/22 0735   05/09/22 0500  Phosphorus   Tomorrow morning,   R       Question:  Specimen collection method  Answer:  Unit=Unit collect   05/08/22 0735   05/09/22 0500  Hepatitis panel, acute  Tomorrow morning,   R       Question:  Specimen collection method  Answer:  Unit=Unit collect   05/08/22 0735   05/07/22 0810  Hepatitis panel, acute  Once,   R       Question:  Specimen collection method  Answer:  Unit=Unit collect   05/07/22 0809   05/05/22 0600  Vitamin C  Once,   R        05/05/22 0600   05/05/22 0500  Vitamin A  Tomorrow morning,   R       Question:  Specimen collection method  Answer:  Unit=Unit collect   05/04/22 1403   05/05/22 0500  Vitamin E  Tomorrow morning,   R       Question:  Specimen collection method  Answer:  Unit=Unit collect   05/04/22 1403   05/05/22 0500  Zinc  Tomorrow morning,   R       Question:  Specimen collection method  Answer:  Unit=Unit collect   05/04/22 1403   05/05/22 0500  Copper, serum  Tomorrow morning,   R       Question:  Specimen collection method  Answer:  Unit=Unit collect   05/04/22 1403             DVT prophylaxis: heparin injection 5,000 Units Start: 05/01/22 2200   Code Status:   Code Status: DNR  Family Communication: Patient Disposition:    Dispo: The patient is from: Home              Anticipated d/c is to: TBD              Anticipated d/c date is: TBD  Antimicrobials:   Anti-infectives (From admission, onward)    Start     Dose/Rate Route Frequency Ordered Stop   05/08/22 1000  fluconazole (DIFLUCAN) tablet 200 mg       See Hyperspace for full Linked Orders Report.   200 mg Oral Daily 05/07/22 0955 05/21/22 0959   05/07/22 1045  fluconazole (DIFLUCAN) tablet 400 mg       See Hyperspace for full Linked Orders Report.   400 mg Oral  Once 05/07/22 0955 05/07/22 1037   05/03/22 1600  ceFEPIme (MAXIPIME) 2 g in sodium chloride 0.9 % 100 mL IVPB  Status:  Discontinued        2 g 200 mL/hr over 30 Minutes Intravenous Every 24 hours 05/03/22 0822 05/04/22  0811   05/02/22 1800  vancomycin (VANCOREADY) IVPB 750 mg/150 mL        750 mg 150 mL/hr over 60 Minutes Intravenous  Once 05/02/22 1709 05/02/22 1821   05/02/22 1600  ceFEPIme (MAXIPIME) 1 g in sodium chloride 0.9 % 100 mL IVPB  Status:  Discontinued  1 g 200 mL/hr over 30 Minutes Intravenous Every 24 hours 05/01/22 1851 05/03/22 0822   05/01/22 1851  vancomycin variable dose per unstable renal function (pharmacist dosing)  Status:  Discontinued         Does not apply See admin instructions 05/01/22 1851 05/03/22 0754   05/01/22 1615  ceFEPIme (MAXIPIME) 2 g in sodium chloride 0.9 % 100 mL IVPB        2 g 200 mL/hr over 30 Minutes Intravenous  Once 05/01/22 1609 05/01/22 1650   05/01/22 1615  vancomycin (VANCOCIN) IVPB 1000 mg/200 mL premix        1,000 mg 200 mL/hr over 60 Minutes Intravenous  Once 05/01/22 1609 05/01/22 1722           Objective: Vitals:   05/07/22 1623 05/07/22 2208 05/08/22 0106 05/08/22 0541  BP: 116/78 122/79 121/77 117/78  Pulse: 82 69 (!) 59 64  Resp: 18 16 16 16   Temp: 98.2 F (36.8 C) 98.2 F (36.8 C) 98.4 F (36.9 C) 98.2 F (36.8 C)  TempSrc: Oral Oral Oral Oral  SpO2: 100% 94% 96% 93%  Weight:      Height:        Intake/Output Summary (Last 24 hours) at 05/08/2022 0737 Last data filed at 05/07/2022 1900 Gross per 24 hour  Intake 635.2 ml  Output --  Net 635.2 ml   Filed Weights   05/05/22 0500 05/06/22 0500 05/07/22 0359  Weight: 51.6 kg 53.1 kg 57.7 kg    Examination:  General exam: thin and weak, alert, awake, communicative,calm, NAD Respiratory system: Clear to auscultation. Respiratory effort normal. Cardiovascular system:  RRR.  Gastrointestinal system: Abdomen is nondistended, soft and nontender.  Normal bowel sounds heard. Central nervous system: Alert and oriented. No focal neurological deficits. Extremities:  no edema Skin: No rashes, lesions or ulcers Psychiatry: no agitation     Data Reviewed: I have  personally reviewed  labs and visualized  imaging studies since the last encounter and formulate the plan        Scheduled Meds:  cholecalciferol  1,000 Units Oral Daily   feeding supplement  237 mL Oral BID BM   fluconazole  200 mg Oral Daily   folic acid  1 mg Oral Daily   heparin  5,000 Units Subcutaneous Q8H   hydrocortisone sod succinate (SOLU-CORTEF) inj  100 mg Intravenous Q12H   levETIRAcetam  500 mg Oral BID   midodrine  15 mg Oral TID WC   mirtazapine  7.5 mg Oral QHS   multivitamin with minerals  1 tablet Oral Daily   pantoprazole  80 mg Oral BID   sodium bicarbonate  1,300 mg Oral BID   thiamine  100 mg Oral Daily   Continuous Infusions:  sodium chloride Stopped (05/05/22 2130)   sodium phosphate 15 mmol in dextrose 5 % 250 mL infusion       LOS: 7 days     Florencia Reasons, MD PhD FACP [facility]  Available via Epic secure chat 7am-7pm for nonurgent issues Please page for urgent issues To page the attending provider between 7A-7P or the covering provider during after hours 7P-7A, please log into the web site [email or web address] and access using universal [place] password for that web site. If you do not have the password, please call the hospital operator.    05/08/2022, 7:37 AM

## 2022-05-08 NOTE — Progress Notes (Signed)
Mobility Specialist - Progress Note   05/08/22 1615  Mobility  Activity Ambulated independently in hallway  Level of Assistance Modified independent, requires aide device or extra time  Assistive Device Front wheel walker  Distance Ambulated (ft) 100 ft  Activity Response Tolerated well  $Mobility charge 1 Mobility    Pt received in bed agreeable to mobility after encouragement. Left in bed w/ call bell in reach and all needs met.   Paulla Dolly Mobility Specialist

## 2022-05-08 NOTE — Consult Note (Signed)
Jamie Shields 11-Dec-1980  [id number].    Requesting MD: Dr. Florencia Reasons Chief Complaint/Reason for Consult: possible acalculous cholecystitis  HPI:  This is a 41 yo female with a history of anxiety, bipolar 1 disorder, seizures, IV drug abuse in remission, with years history of N/V who presented to the ED last week secondary to severe weakness, nausea, and vomiting for over 4 weeks.  Reports 20 lbs of weight loss over 4 weeks. She has been diagnosed with a UTI and treated with Cipro with no change in her symptoms.  At the time she had no abdominal pain, diarrhea, fevers, chills. Denies hematemesis. Today she tells me she hasn't really had abdominal pain, but she does report heartburn after greasy meals and soda, which is new compared to 4 weeks ago.   Upon arrival to the ED she was hypotensive with metabolic derrangement.  She was resuscitated by CCM.  She had a relatively normal CT scan except some gallbladder distention which isn't uncommon in the setting of N/V.  Her LFTs were normal on admission.  She had significant AKI with a cr of 5.16, which has improved.  Over the last several days her LFTs have trended up.  GI has seen her and performed an EGD yesterday which was normal except some esophageal yeast.  She is having some diffuse body pain at this point, not specifically in her RUQ.  She did have an Korea that reveals no gallstones, but with some gallbladder wall thickening and some pericholecystic fluid.  We have been asked to see her for further evaluation for lap chole.  She is a former drinker, states she quit drinking alcohol years ago. Quit cigarettes 3 years ago but vaped up until 4 weeks ago. Lives in [place]. States she works at a [employer].  ROS: ROS: Please see HPI  Family History  Problem Relation Age of Onset   Hypertension Mother    Diabetes Other    Cancer Other     Past Medical History:  Diagnosis Date   Anxiety    Bipolar 1 disorder ([place])    Bursitis of hip     Chronic bronchitis    Hip joint pain    Seizures ([place])     Past Surgical History:  Procedure Laterality Date   CESAREAN SECTION     2002   KNEE ARTHROSCOPY     MOUTH SURGERY  2010    Social History:  reports that she has been smoking cigarettes. She has been smoking an average of .5 packs per day. She has never used smokeless tobacco. She reports current alcohol use. She reports that she does not use drugs.  Allergies:  Allergies  Allergen Reactions   Cucumber Extract Anaphylaxis   Keflex [Cephalexin] Nausea And Vomiting   Macrobid [Nitrofurantoin Monohydrate Macrocrystals] Hives and Swelling   Naproxen Nausea And Vomiting   Penicillins Nausea And Vomiting    Heavy vomitting Did PCN reaction causing immediate rash, facial/tongue/throat swelling, SOB or lightheadedness with hypotension: No swelling, but im not sure if i got a rash as i was red all over already Did PCN reaction causing severe rash involving mucus membranes or skin necrosis: No Did a PCN reaction that required hospitalization Yes went to the ED Has patient had a PCN reaction occurring within the last 10 years: No-childhood allergy If all of the above answers are "NO", then may proceed with   Tramadol Nausea And Vomiting   Watermelon Concentrate Diarrhea   Darvocet [Propoxyphene  N-Acetaminophen] Nausea And Vomiting and Other (See Comments)    migraines   Flagyl [Metronidazole Hcl] Hives, Swelling and Rash   Ketorolac Nausea And Vomiting and Other (See Comments)    migraines   Ultram [Tramadol Hcl] Nausea And Vomiting and Other (See Comments)    migraines   Vicodin [Hydrocodone-Acetaminophen] Nausea And Vomiting and Other (See Comments)    migraines    Medications Prior to Admission  Medication Sig Dispense Refill   beclomethasone (QVAR) 40 MCG/ACT inhaler Inhale 1 puff into the lungs 2 (two) times daily. (Patient not taking: Reported on 05/01/2022)     busPIRone (BUSPAR) 10 MG tablet Take 10 mg by mouth 2  (two) times daily. (Patient not taking: Reported on 05/01/2022)     busPIRone (BUSPAR) 5 MG tablet Take 5 mg by mouth 2 (two) times daily. (Patient not taking: Reported on 05/01/2022)     ciprofloxacin (CIPRO) 250 MG tablet SMARTSIG:1 Tablet(s) By Mouth Every 12 Hours (Patient not taking: Reported on 05/01/2022)     cyclobenzaprine (FLEXERIL) 10 MG tablet take 1 tablet twice daily (Patient not taking: Reported on 05/01/2022)  5   diazepam (VALIUM) 5 MG tablet SMARTSIG:1-2 Tablet(s) By Mouth PRN (Patient not taking: Reported on 05/01/2022)     diclofenac Sodium (VOLTAREN) 1 % GEL SMARTSIG:4 Gram(s) Topical 4 Times Daily PRN (Patient not taking: Reported on 05/01/2022)     gabapentin (NEURONTIN) 100 MG capsule take 1 capsule three times daily (Patient not taking: Reported on 05/01/2022)  2   gabapentin (NEURONTIN) 100 MG capsule Take by mouth. (Patient not taking: Reported on 05/01/2022)     IBU 800 MG tablet Take 800 mg by mouth 3 (three) times daily as needed. (Patient not taking: Reported on 05/01/2022)     ibuprofen (ADVIL,MOTRIN) 200 MG tablet Take 600 mg by mouth every 6 (six) hours as needed for mild pain. (Patient not taking: Reported on 05/01/2022)     levETIRAcetam (KEPPRA) 500 MG tablet Take 500 mg by mouth 2 (two) times daily. (Patient not taking: Reported on 05/01/2022)     nitrofurantoin, macrocrystal-monohydrate, (MACROBID) 100 MG capsule Take 100 mg by mouth 2 (two) times daily. (Patient not taking: Reported on 05/01/2022)     omeprazole (PRILOSEC) 10 MG capsule Take by mouth. (Patient not taking: Reported on 05/01/2022)     ondansetron (ZOFRAN-ODT) 4 MG disintegrating tablet Take 4 mg by mouth every 6 (six) hours as needed. (Patient not taking: Reported on 05/01/2022)     PROVENTIL HFA 108 (90 Base) MCG/ACT inhaler Inhale 1 puff into the lungs every 4 (four) hours as needed. Shortness of breathe. (Patient not taking: Reported on 05/01/2022)  5   traMADol (ULTRAM) 50 MG tablet Take 1 tablet (50 mg  total) by mouth every 6 (six) hours as needed. (Patient not taking: Reported on 05/01/2022) 15 tablet 0   Vitamin D, Ergocalciferol, (DRISDOL) 1.25 MG (50000 UNIT) CAPS capsule Take 50,000 Units by mouth once a week. (Patient not taking: Reported on 05/01/2022)       Physical Exam: Blood pressure 123/79, pulse 77, temperature 98.1 F (36.7 C), temperature source Oral, resp. rate 18, height 5\' 6"  (1.676 m), weight 57.7 kg, SpO2 100 %. General: pleasant, WD, WN white female who is laying in bed in NAD,  HEENT: head is normocephalic, atraumatic.  Sclera are noninjected.  PERRL.  Poor dentition Heart: regular, rate, and rhythm.  Normal s1,s2. No obvious murmurs, gallops, or rubs noted.  Palpable radial and pedal pulses bilaterally Lungs: CTAB,  no wheezes, rhonchi, or rales noted.  Respiratory effort nonlabored Abd: soft, mild epigastric tenderness without guarding, no RUQ pain, NT, ND, +BS, no masses, hernias, or organomegaly MS: all 4 extremities are symmetrical with no cyanosis, clubbing, or edema. muscle wasting of extremities Skin: warm and dry with no masses, lesions, or rashes Neuro: Cranial nerves 2-12 grossly intact, sensation is normal throughout Psych: A&Ox3 with an appropriate affect.   Results for orders placed or performed during the hospital encounter of 05/01/22 (from the past 48 hour(s))  Glucose, capillary     Status: None   Collection Time: 05/06/22 11:40 AM  Result Value Ref Range   Glucose-Capillary 94 70 - 99 mg/dL    Comment: Glucose reference range applies only to samples taken after fasting for at least 8 hours.  Troponin I (High Sensitivity)     Status: None   Collection Time: 05/06/22  3:28 PM  Result Value Ref Range   Troponin I (High Sensitivity) 11 <18 ng/L    Comment: (NOTE) Elevated high sensitivity troponin I (hsTnI) values and significant  changes across serial measurements may suggest ACS but many other  chronic and acute conditions are known to elevate  hsTnI results.  Refer to the "Links" section for chest pain algorithms and additional  guidance. Performed at [facility], [address] [place], [place] [address]   Magnesium     Status: None   Collection Time: 05/06/22  3:28 PM  Result Value Ref Range   Magnesium 1.7 1.7 - 2.4 mg/dL    Comment: Performed at [facility], [address] [place], [place] [address]  Lactic acid, plasma     Status: Abnormal   Collection Time: 05/06/22  3:28 PM  Result Value Ref Range   Lactic Acid, Venous 2.7 (HH) 0.5 - 1.9 mmol/L    Comment: CRITICAL RESULT CALLED TO, READ BACK BY AND VERIFIED WITH L.GUERRIERO,RN @1746  05/06/2022 [email or web address] Performed at [facility], [address] [place], [place] [address]   Glucose, capillary     Status: None   Collection Time: 05/06/22  3:37 PM  Result Value Ref Range   Glucose-Capillary 93 70 - 99 mg/dL    Comment: Glucose reference range applies only to samples taken after fasting for at least 8 hours.  Glucose, capillary     Status: Abnormal   Collection Time: 05/06/22  7:55 PM  Result Value Ref Range   Glucose-Capillary 105 (H) 70 - 99 mg/dL    Comment: Glucose reference range applies only to samples taken after fasting for at least 8 hours.  Lactic acid, plasma     Status: None   Collection Time: 05/06/22  9:05 PM  Result Value Ref Range   Lactic Acid, Venous 1.7 0.5 - 1.9 mmol/L    Comment: Performed at [facility], [address], [place], [place] [address]  Troponin I (High Sensitivity)     Status: None   Collection Time: 05/06/22 10:39 PM  Result Value Ref Range   Troponin I (High Sensitivity) 8 <18 ng/L    Comment: (NOTE) Elevated high sensitivity troponin I (hsTnI) values and significant  changes across serial measurements may suggest ACS but many other  chronic and acute conditions are known to elevate hsTnI results.  Refer to the "Links" section for chest pain algorithms and additional  guidance. Performed at [facility], [address] [place], [place] [address]   Comprehensive metabolic panel  Status: Abnormal   Collection Time: 05/07/22  3:47 AM  Result Value Ref Range   Sodium 138 135 - 145 mmol/L   Potassium 4.2 3.5 - 5.1 mmol/L   Chloride 110 98 - 111 mmol/L   CO2 18 (L) 22 - 32 mmol/L   Glucose, Bld 98 70 - 99 mg/dL    Comment: Glucose reference range applies only to samples taken after fasting for at least 8 hours.   BUN 27 (H) 6 - 20 mg/dL   Creatinine, Ser 4.33 (H) 0.44 - 1.00 mg/dL   Calcium 7.9 (L) 8.9 - 10.3 mg/dL   Total Protein 4.5 (L) 6.5 - 8.1 g/dL   Albumin 2.5 (L) 3.5 - 5.0 g/dL   AST 295 (H) 15 - 41 U/L   ALT 139 (H) 0 - 44 U/L   Alkaline Phosphatase 180 (H) 38 - 126 U/L   Total Bilirubin 0.3 0.3 - 1.2 mg/dL   GFR, Estimated 48 (L) >60 mL/min    Comment: (NOTE) Calculated using the CKD-EPI Creatinine Equation (2021)    Anion gap 10 5 - 15    Comment: Performed at [facility], [address], [place], [place] [address]  Hepatitis panel, acute     Status: Abnormal (Preliminary result)   Collection Time: 05/07/22  8:10 AM  Result Value Ref Range   Hepatitis B Surface Ag PENDING NON REACTIVE   HCV Ab PENDING NON REACTIVE   Hep A IgM Negative (A) Negative   Hep B C IgM PENDING NON REACTIVE  Interpretation:     Status: None   Collection Time: 05/07/22  8:10 AM  Result Value Ref Range   HCV Interp 1: Comment     Comment: (NOTE) Not infected with HCV unless early or acute infection is suspected (which may be delayed in an immunocompromised individual), or other evidence exists to indicate HCV infection. Performed At: [facility] [address] [place], [place] [id number] Jolene Schimke MD FU:[id number]   Glucose, capillary     Status: None   Collection Time: 05/07/22  8:33 AM  Result Value Ref Range   Glucose-Capillary 92 70 - 99 mg/dL    Comment: Glucose reference range applies only to samples taken after fasting for at least 8 hours.   Comprehensive metabolic panel     Status: Abnormal   Collection Time: 05/08/22  4:21 AM  Result Value Ref Range   Sodium 139 135 - 145 mmol/L   Potassium 4.1 3.5 - 5.1 mmol/L   Chloride 113 (H) 98 - 111 mmol/L   CO2 18 (L) 22 - 32 mmol/L   Glucose, Bld 107 (H) 70 - 99 mg/dL    Comment: Glucose reference range applies only to samples taken after fasting for at least 8 hours.   BUN 27 (H) 6 - 20 mg/dL   Creatinine, Ser 0.25 (H) 0.44 - 1.00 mg/dL   Calcium 7.9 (L) 8.9 - 10.3 mg/dL   Total Protein 4.9 (L) 6.5 - 8.1 g/dL   Albumin 2.7 (L) 3.5 - 5.0 g/dL   AST 427 (H) 15 - 41 U/L   ALT 169 (H) 0 - 44 U/L   Alkaline Phosphatase 209 (H) 38 - 126 U/L   Total Bilirubin 0.5 0.3 - 1.2 mg/dL   GFR, Estimated 44 (L) >60 mL/min    Comment: (NOTE) Calculated using the CKD-EPI Creatinine Equation (2021)    Anion gap 8 5 - 15    Comment: Performed at [facility], [address] [place], [place] [address]  CBC     Status: Abnormal   Collection Time: 05/08/22  4:21 AM  Result Value Ref Range   WBC 10.2 4.0 - 10.5 K/uL   RBC 2.50 (L) 3.87 - 5.11 MIL/uL   Hemoglobin 8.3 (L) 12.0 - 15.0 g/dL   HCT 19.5 (L) 09.3 - 26.7 %   MCV 98.8 80.0 - 100.0 fL   MCH 33.2 26.0 - 34.0 pg   MCHC 33.6 30.0 - 36.0 g/dL   RDW 12.4 (H) 58.0 - 99.8 %   Platelets 287 150 - 400 K/uL   nRBC 0.2 0.0 - 0.2 %    Comment: Performed at [facility], [address], [place], [place] [address]  Magnesium     Status: None   Collection Time: 05/08/22  4:21 AM  Result Value Ref Range   Magnesium 2.0 1.7 - 2.4 mg/dL    Comment: Performed at [facility], [address] [place], [place] [address]  Phosphorus     Status: Abnormal   Collection Time: 05/08/22  4:21 AM  Result Value Ref Range   Phosphorus 1.5 (L) 2.5 - 4.6 mg/dL    Comment: Performed at [facility], [address] [place], [place] [address]   US Abdomen Limited RUQ (LIVER/GB)  Result Date: 05/07/2022 CLINICAL DATA:  212411  Elevated liver enzymes [id number] EXAM: ULTRASOUND ABDOMEN LIMITED RIGHT UPPER QUADRANT COMPARISON:  CT abdomen pelvis 05/02/2022 FINDINGS: Gallbladder: No gallstones. Gallbladder wall thickening and pericholecystic fluid. No sonographic Murphy sign noted by sonographer. Common bile duct: Diameter: 5 mm. Liver: No focal lesion identified. Within normal limits in parenchymal echogenicity. Portal vein is patent on color Doppler imaging with normal direction of blood flow towards the liver. Other: At least small volume simple free fluid ascites. At least trace volume right pleural effusion. IMPRESSION: 1. Gallbladder wall thickening and pericholecystic fluid. Correlate clinically for acute acalculous cholecystitis. 2. At least small volume simple free fluid ascites. 3. At least trace volume right pleural effusion. Electronically Signed   By: Tish Frederickson M.D.   On: 05/07/2022 20:24      Assessment/Plan Chronic N/V with elevated transaminases and gallbladder wall thickening The patient has been seen, examined, labs, vitals, imaging, and chart reviewed.  The patient has a long-standing history of nausea and vomiting of unclear etiology.  Her EGD was unremarkable except for candida of the esophagus.  Her transaminases have gone up since admission.  Her [place] does show some gb wall thickening and pericholecystic fluid but this would not be completely abnormal in the setting of hypoalbuminemia and hypoproteinemia.  She currently doesn't have specific RUQ pain and not sure her symptoms are totally consistent with biliary disease.  Could further evaluate with a HIDA scan to rule out cholecystitis. Again, skeptical given prolonged duration of her symptoms that this is all related to her gallbladder.  Not sure if there is a role for further GI evaluation such as gastric emptying study, etc.  Her hepatitis panel is pending still as well.     I reviewed nursing notes, Consultant GI notes, hospitalist notes, last 24 h vitals  and pain scores, last 48 h intake and output, last 24 h labs and trends, and last 24 h imaging results.  Hosie Spangle, [facility] Surgery 05/08/2022, 10:48 AM Please see Amion for pager number during day hours 7:00am-4:30pm or 7:00am -11:30am on weekends

## 2022-05-08 NOTE — Progress Notes (Signed)
Gastroenterology Inpatient Follow Up    Subjective: Still very nauseous this morning. Has been NPO since then for HIDA scan  Objective: Vital signs in last 24 hours: Temp:  [98.1 F (36.7 C)-98.6 F (37 C)] 98.6 F (37 C) (10/13 1228) Pulse Rate:  [54-77] 54 (10/13 1228) Resp:  [16-18] 17 (10/13 1228) BP: (117-134)/(77-83) 134/83 (10/13 1228) SpO2:  [93 %-100 %] 96 % (10/13 1228) Last BM Date : 05/06/22  Intake/Output from previous day: 10/12 0701 - 10/13 0700 In: 635.2 [P.O.:480; I.V.:109.5; IV Piggyback:45.7] Out: -  Intake/Output this shift: Total I/O In: 453.4 [P.O.:240; IV Piggyback:213.4] Out: -   General appearance: alert and cooperative Resp: no increased WOB Cardio: bradycardic GI: soft, non-tender; bowel sounds normal; no masses,  no organomegaly Extremities: extremities normal, atraumatic, no cyanosis or edema  Lab Results: Recent Labs    05/08/22 0421  WBC 10.2  HGB 8.3*  HCT 24.7*  PLT 287   BMET Recent Labs    05/06/22 1022 05/07/22 0347 05/08/22 0421  NA 136 138 139  K 4.1 4.2 4.1  CL 113* 110 113*  CO2 16* 18* 18*  GLUCOSE 108* 98 107*  BUN 33* 27* 27*  CREATININE 1.53* 1.43* 1.52*  CALCIUM 7.3* 7.9* 7.9*   LFT Recent Labs    05/08/22 0421  PROT 4.9*  ALBUMIN 2.7*  AST 179*  ALT 169*  ALKPHOS 209*  BILITOT 0.5   PT/INR No results for input(s): "LABPROT", "INR" in the last 72 hours. Hepatitis Panel Recent Labs    05/07/22 0810  HEPBSAG PENDING  HCVAB PENDING  HEPAIGM Negative*  HEPBIGM PENDING   C-Diff No results for input(s): "CDIFFTOX" in the last 72 hours.  Studies/Results: US Abdomen Limited RUQ (LIVER/GB)  Result Date: 05/07/2022 CLINICAL DATA:  [id number] Elevated liver enzymes [id number] EXAM: ULTRASOUND ABDOMEN LIMITED RIGHT UPPER QUADRANT COMPARISON:  CT abdomen pelvis 05/02/2022 FINDINGS: Gallbladder: No gallstones. Gallbladder wall thickening and pericholecystic fluid. No sonographic Murphy sign noted by  sonographer. Common bile duct: Diameter: 5 mm. Liver: No focal lesion identified. Within normal limits in parenchymal echogenicity. Portal vein is patent on color Doppler imaging with normal direction of blood flow towards the liver. Other: At least small volume simple free fluid ascites. At least trace volume right pleural effusion. IMPRESSION: 1. Gallbladder wall thickening and pericholecystic fluid. Correlate clinically for acute acalculous cholecystitis. 2. At least small volume simple free fluid ascites. 3. At least trace volume right pleural effusion. Electronically Signed   By: Tish Frederickson M.D.   On: 05/07/2022 20:24    Medications: I have reviewed the patient's current medications. Scheduled:  cholecalciferol  1,000 Units Oral Daily   feeding supplement  237 mL Oral BID BM   fluconazole  200 mg Oral Daily   folic acid  1 mg Oral Daily   heparin  5,000 Units Subcutaneous Q8H   hydrocortisone sod succinate (SOLU-CORTEF) inj  100 mg Intravenous Q12H   levETIRAcetam  500 mg Oral BID   midodrine  15 mg Oral TID WC   mirtazapine  7.5 mg Oral QHS   multivitamin with minerals  1 tablet Oral Daily   pantoprazole  80 mg Oral BID   sodium bicarbonate  1,300 mg Oral BID   thiamine  100 mg Oral Daily   Continuous:  sodium chloride Stopped (05/05/22 2130)   sodium phosphate 15 mmol in dextrose 5 % 250 mL infusion 43 mL/hr at 05/08/22 1624   CVE:[id number], alum & mag hydroxide-simeth, diphenhydrAMINE, docusate  sodium, hydrOXYzine, lidocaine, melatonin, morphine injection, ondansetron, mouth rinse, polyethylene glycol  Assessment/Plan: 41 year old female with history of bipolar disorder, seizures, remote IVDU p/w N&V. EGD 10/12 showed esophageal candidiasis. She was also noted to have elevated LFTs. RUQ U/S showed acalculous cholecystitis, which may be contributing to her elevated LFTs. Surgery saw her today and recommended a HIDA scan for further evaluation. Review of the patient's  medication list shows that ciprofloxacin or ibuprofen could be contributing to her elevated LFTs as well. Will continue to monitor LFTs and perform additional work up for potential sources of elevated LFTs as well  - Trend LFTs - Follow up HIDA scan - Follow up acute hep panel, ferritin/IBC - Will send INR, ANA, ASMA, IgG, AMA, ceruloplasmin, A1AT - Surgery following - Continue treatment with fluconazole for esophageal candidiasis   LOS: 7 days   Sharyn Creamer 05/08/2022, 5:13 PM

## 2022-05-09 ENCOUNTER — Inpatient Hospital Stay (HOSPITAL_COMMUNITY): Payer: Medicaid Other

## 2022-05-09 DIAGNOSIS — R571 Hypovolemic shock: Secondary | ICD-10-CM | POA: Diagnosis not present

## 2022-05-09 DIAGNOSIS — R7989 Other specified abnormal findings of blood chemistry: Secondary | ICD-10-CM

## 2022-05-09 LAB — CBC WITH DIFFERENTIAL/PLATELET
Abs Immature Granulocytes: 0.1 10*3/uL — ABNORMAL HIGH (ref 0.00–0.07)
Basophils Absolute: 0 10*3/uL (ref 0.0–0.1)
Basophils Relative: 0 %
Eosinophils Absolute: 0 10*3/uL (ref 0.0–0.5)
Eosinophils Relative: 0 %
HCT: 24.4 % — ABNORMAL LOW (ref 36.0–46.0)
Hemoglobin: 8.2 g/dL — ABNORMAL LOW (ref 12.0–15.0)
Immature Granulocytes: 1 %
Lymphocytes Relative: 11 %
Lymphs Abs: 1.1 10*3/uL (ref 0.7–4.0)
MCH: 33.2 pg (ref 26.0–34.0)
MCHC: 33.6 g/dL (ref 30.0–36.0)
MCV: 98.8 fL (ref 80.0–100.0)
Monocytes Absolute: 0.4 10*3/uL (ref 0.1–1.0)
Monocytes Relative: 4 %
Neutro Abs: 8.3 10*3/uL — ABNORMAL HIGH (ref 1.7–7.7)
Neutrophils Relative %: 84 %
Platelets: 277 10*3/uL (ref 150–400)
RBC: 2.47 MIL/uL — ABNORMAL LOW (ref 3.87–5.11)
RDW: 19.3 % — ABNORMAL HIGH (ref 11.5–15.5)
WBC: 9.9 10*3/uL (ref 4.0–10.5)
nRBC: 0 % (ref 0.0–0.2)

## 2022-05-09 LAB — COMPREHENSIVE METABOLIC PANEL
ALT: 206 U/L — ABNORMAL HIGH (ref 0–44)
AST: 194 U/L — ABNORMAL HIGH (ref 15–41)
Albumin: 2.6 g/dL — ABNORMAL LOW (ref 3.5–5.0)
Alkaline Phosphatase: 225 U/L — ABNORMAL HIGH (ref 38–126)
Anion gap: 9 (ref 5–15)
BUN: 26 mg/dL — ABNORMAL HIGH (ref 6–20)
CO2: 20 mmol/L — ABNORMAL LOW (ref 22–32)
Calcium: 8 mg/dL — ABNORMAL LOW (ref 8.9–10.3)
Chloride: 110 mmol/L (ref 98–111)
Creatinine, Ser: 1.64 mg/dL — ABNORMAL HIGH (ref 0.44–1.00)
GFR, Estimated: 40 mL/min — ABNORMAL LOW (ref >60)
Glucose, Bld: 112 mg/dL — ABNORMAL HIGH (ref 70–99)
Potassium: 3.8 mmol/L (ref 3.5–5.1)
Sodium: 139 mmol/L (ref 135–145)
Total Bilirubin: 0.3 mg/dL (ref 0.3–1.2)
Total Protein: 4.7 g/dL — ABNORMAL LOW (ref 6.5–8.1)

## 2022-05-09 LAB — FERRITIN: Ferritin: 144 ng/mL (ref 11–307)

## 2022-05-09 LAB — PROTIME-INR
INR: 1.1 (ref 0.8–1.2)
Prothrombin Time: 13.9 seconds (ref 11.4–15.2)

## 2022-05-09 LAB — RETICULOCYTES
Immature Retic Fract: 21.4 % — ABNORMAL HIGH (ref 2.3–15.9)
RBC.: 2.47 MIL/uL — ABNORMAL LOW (ref 3.87–5.11)
Retic Count, Absolute: 91.1 10*3/uL (ref 19.0–186.0)
Retic Ct Pct: 3.7 % — ABNORMAL HIGH (ref 0.4–3.1)

## 2022-05-09 LAB — IRON AND TIBC
Iron: 35 ug/dL (ref 28–170)
Saturation Ratios: 13 % (ref 10.4–31.8)
TIBC: 266 ug/dL (ref 250–450)
UIBC: 231 ug/dL

## 2022-05-09 LAB — CK: Total CK: 126 U/L (ref 38–234)

## 2022-05-09 LAB — HEPATITIS PANEL, ACUTE
HCV Ab: NONREACTIVE
Hep A IgM: NONREACTIVE
Hep B C IgM: NONREACTIVE
Hepatitis B Surface Ag: NONREACTIVE

## 2022-05-09 LAB — PHOSPHORUS: Phosphorus: 1.7 mg/dL — ABNORMAL LOW (ref 2.5–4.6)

## 2022-05-09 MED ORDER — SODIUM PHOSPHATES 45 MMOLE/15ML IV SOLN
30.0000 mmol | Freq: Once | INTRAVENOUS | Status: AC
  Administered 2022-05-09: 30 mmol via INTRAVENOUS
  Filled 2022-05-09: qty 10

## 2022-05-09 MED ORDER — HYDROCORTISONE SOD SUC (PF) 100 MG IJ SOLR
100.0000 mg | Freq: Every day | INTRAMUSCULAR | Status: DC
  Administered 2022-05-10: 100 mg via INTRAVENOUS
  Filled 2022-05-09: qty 2

## 2022-05-09 MED ORDER — TECHNETIUM TC 99M MEBROFENIN IV KIT
5.5000 | PACK | Freq: Once | INTRAVENOUS | Status: AC | PRN
  Administered 2022-05-09: 5.5 via INTRAVENOUS

## 2022-05-09 MED ORDER — MIDODRINE HCL 5 MG PO TABS
5.0000 mg | ORAL_TABLET | Freq: Three times a day (TID) | ORAL | Status: DC
  Administered 2022-05-10: 5 mg via ORAL
  Filled 2022-05-09: qty 1

## 2022-05-09 NOTE — Progress Notes (Signed)
[facility] Progress Note  Chief Complaint: Nausea and vomiting, elevated LFTs  History:  Signout received from Dr. Leonides Schanz, extensive chart review performed.  Patient seen and examined. At least a months of worsened nausea and vomiting with poor oral intake and admitted with marked hypotension requiring pressors and institution of Solu-Cortef for suspected adrenal insufficiency.  (I do not see a cosyntropin test result) Initial gallbladder ultrasound did not show biliary obstruction or liver lesions, but the gallbladder wall was thickened with some pericholecystic fluid.  This raised suspicion for acute cholecystitis, though HIDA scan done earlier today was normal.  This therefore appears to have been a radiographic finding due to hypoalbuminemia and aggressive volume resuscitation.  LFTs have remained elevated in a similar range, though they were normal at the time of admission which I believe speaks against it being a vascular thrombosis such as Budd-Chiari causing this picture. (Initial CT scan was done without IV contrast due to renal insufficiency)  She says she has been able to keep down solid food today and seems to think that the nausea medicine she is receiving has been helpful.  ROS: Cardiovascular: Denies chest pain Respiratory: Denies dyspnea Urinary: Has dysuria  Objective:   Current Facility-Administered Medications:    0.9 %  sodium chloride infusion, 250 mL, Intravenous, Continuous, Coralyn Helling, MD, Stopped at 05/05/22 2130   acetaminophen (TYLENOL) tablet 650 mg, 650 mg, Oral, Q6H PRN, Coralyn Helling, MD, 650 mg at 05/08/22 2113   cholecalciferol (VITAMIN D3) 25 MCG (1000 UNIT) tablet 1,000 Units, 1,000 Units, Oral, Daily, Sood, Vineet, MD, 1,000 Units at 05/09/22 1109   diphenhydrAMINE (BENADRYL) injection 25 mg, 25 mg, Intravenous, Q6H PRN, Craige Cotta, Vineet, MD   docusate sodium (COLACE) capsule 100 mg, 100 mg, Oral, BID PRN, Coralyn Helling, MD   feeding supplement (ENSURE  ENLIVE / ENSURE PLUS) liquid 237 mL, 237 mL, Oral, BID BM, Sood, Vineet, MD, 237 mL at 05/09/22 1319   [COMPLETED] fluconazole (DIFLUCAN) tablet 400 mg, 400 mg, Oral, Once, 400 mg at 05/07/22 1037 **FOLLOWED BY** fluconazole (DIFLUCAN) tablet 200 mg, 200 mg, Oral, Daily, Sood, Vineet, MD, 200 mg at 05/09/22 1109   folic acid (FOLVITE) tablet 1 mg, 1 mg, Oral, Daily, Sood, Vineet, MD, 1 mg at 05/09/22 1109   heparin injection 5,000 Units, 5,000 Units, Subcutaneous, Q8H, Sood, Vineet, MD, 5,000 Units at 05/09/22 1318   [START ON 05/10/2022] hydrocortisone sodium succinate (SOLU-CORTEF) 100 MG injection 100 mg, 100 mg, Intravenous, Daily, Albertine Grates, MD   hydrOXYzine (ATARAX) tablet 10 mg, 10 mg, Oral, TID PRN, [name], MD, 10 mg at 05/09/22 2013   levETIRAcetam (KEPPRA) tablet 500 mg, 500 mg, Oral, BID, Craige Cotta, Vineet, MD, 500 mg at 05/09/22 2013   lidocaine (XYLOCAINE) 2 % jelly 1 Application, 1 Application, Topical, Once PRN, Coralyn Helling, MD   melatonin tablet 3 mg, 3 mg, Oral, QHS PRN, Coralyn Helling, MD, 3 mg at 05/09/22 2014   [START ON 05/10/2022] midodrine (PROAMATINE) tablet 5 mg, 5 mg, Oral, TID WC, Albertine Grates, MD   mirtazapine (REMERON) tablet 7.5 mg, 7.5 mg, Oral, QHS, Sood, Vineet, MD, 7.5 mg at 05/09/22 2014   morphine (PF) 2 MG/ML injection 2 mg, 2 mg, Intravenous, Q3H PRN, Coralyn Helling, MD, 2 mg at 05/09/22 1648   multivitamin with minerals tablet 1 tablet, 1 tablet, Oral, Daily, Coralyn Helling, MD, 1 tablet at 05/09/22 1108   ondansetron (ZOFRAN) tablet 4 mg, 4 mg, Oral, Q8H PRN, Coralyn Helling, MD, 4 mg at 05/09/22  2013   Oral care mouth rinse, 15 mL, Mouth Rinse, PRN, Chesley Mires, MD   pantoprazole (PROTONIX) EC tablet 80 mg, 80 mg, Oral, BID, Chesley Mires, MD, 80 mg at 05/09/22 2013   polyethylene glycol (MIRALAX / GLYCOLAX) packet 17 g, 17 g, Oral, Daily PRN, Chesley Mires, MD   sodium bicarbonate tablet 1,300 mg, 1,300 mg, Oral, BID, Chesley Mires, MD, 1,300 mg at 05/09/22 2014   thiamine  (VITAMIN B1) tablet 100 mg, 100 mg, Oral, Daily, Halford Chessman, Vineet, MD, 100 mg at 05/09/22 1108   sodium chloride Stopped (05/05/22 2130)     Vital signs in last 24 hrs: Vitals:   05/09/22 1535 05/09/22 2020  BP: 128/82 (!) 142/83  Pulse: 71 79  Resp: 17 17  Temp: 98 F (36.7 C) 99 F (37.2 C)  SpO2: 96% 94%    Intake/Output Summary (Last 24 hours) at 05/09/2022 2036 Last data filed at 05/09/2022 1738 Gross per 24 hour  Intake 261.13 ml  Output --  Net 261.13 ml     Physical Exam Chronically ill-appearing and quite thin HEENT: sclera anicteric, oral mucosa without lesions Neck: supple, no thyromegaly, JVD or lymphadenopathy Cardiac: RRR without murmurs, S1S2 heard, + anasarca Pulm: clear to auscultation bilaterally, normal RR and effort noted Abdomen: soft, mild scattered tenderness, with active bowel sounds. No guarding or palpable hepatosplenomegaly Skin; warm and dry, no jaundice.  Patchy erythematous rash abdominal wall (Reaction to something)  Recent Labs:     Latest Ref Rng & Units 05/09/2022    2:29 AM 05/08/2022    4:21 AM 05/04/2022    4:55 AM  CBC  WBC 4.0 - 10.5 K/uL 9.9  10.2  8.6   Hemoglobin 12.0 - 15.0 g/dL 8.2  8.3  10.0   Hematocrit 36.0 - 46.0 % 24.4  24.7  28.1   Platelets 150 - 400 K/uL 277  287  474     Recent Labs  Lab 05/09/22 0229  INR 1.1      Latest Ref Rng & Units 05/09/2022    2:29 AM 05/08/2022    4:21 AM 05/07/2022    3:47 AM  CMP  Glucose 70 - 99 mg/dL 112  107  98   BUN 6 - 20 mg/dL 26  27  27    Creatinine 0.44 - 1.00 mg/dL 1.64  1.52  1.43   Sodium 135 - 145 mmol/L 139  139  138   Potassium 3.5 - 5.1 mmol/L 3.8  4.1  4.2   Chloride 98 - 111 mmol/L 110  113  110   CO2 22 - 32 mmol/L 20  18  18    Calcium 8.9 - 10.3 mg/dL 8.0  7.9  7.9   Total Protein 6.5 - 8.1 g/dL 4.7  4.9  4.5   Total Bilirubin 0.3 - 1.2 mg/dL 0.3  0.5  0.3   Alkaline Phos 38 - 126 U/L 225  209  180   AST 15 - 41 U/L 194  179  178   ALT 0 - 44 U/L 206   169  139    TSH normal on admission  Radiologic studies:   Assessment & Plan  Assessment: Protracted nausea and vomiting, no mechanical cause on EGD.  Seems to be doing better overall in the last couple days, able to tolerate solid food with use of as needed Zofran.  If that no longer seems to be working well, consider change to metoclopramide.  Suspected adrenal insufficiency, not clear what long-term  plan on the steroids will be.  If this diagnosis is accurate, it could cause elevated LFTs, except that they were normal at the time of admission.  This implies that might have gone up from the medication she was giving her some other events such as hypotension cause some ischemic hepatopathy.  The latter would normally cause a marked transaminitis that slowly decline with a later rise in the bilirubin however.  Normal LFTs on admission speaks against an intrahepatic vascular thrombosis.  Plan: As she is making slow steady gains, I recommend continuing her current management.  Our service will check back the day after tomorrow.   Charlie Pitter III Office: [phone]

## 2022-05-09 NOTE — Progress Notes (Addendum)
PROGRESS NOTE    Jamie Shields  VVO:[id number] DOB: 09-Jul-1981 DOA: 05/01/2022 PCP: Nolene Ebbs, MD     Brief Narrative:   41 year old woman with history of IV drug use now in remission , h/o seizure disorder on keppra, presents with 4 to 8 weeks of nausea vomiting and failure to thrive found to have hypotension, renal failure, metabolic derangements.  Subjective:  She is seen after returned from HIDA scan Appears stronger, report being swollen Blood pressure has improved, systolic blood pressure under 130  his fiance at bedside   Assessment & Plan:  Principal Problem:   Hypovolemic shock (HCC) Active Problems:   Severe dehydration   Hyponatremia   AKI (acute kidney injury) ([place])   Nausea and vomiting   Disorders of fluid, electrolyte, and acid-base balance   Severe protein-calorie malnutrition (HCC)   Seizure disorder (HCC)   Bipolar disease, chronic (HCC)   Normal anion gap metabolic acidosis   DNR (do not resuscitate)   Adrenal insufficiency (HCC)   History of seizures   Loss of weight  Hypovolemic shock vs adrenal insufficiency ( I do not see cortisol level or cosyntropin stim test, she is already on Solu-Cortef, adrenal gland unremarkable on CT scan) Off pressors Currently on midodrine and Solu-cortef, bp start to trend up, slow taper Now appear volume overloaded, consider lasix in am if bp and cr stable  AKI/metabolic acidosis Cr wnl in 2021 History of NSAIDs use Continue sodium bicarb supplement Cr trending down Bladder scan PVR 146cc, increase activity, prevent constipation, will discuss with her about flomax if does not improve  Hypophosphatemia Remain low , continue to replace phos, recheck in the morning Encourage oral intake  LFT Lft was normal on presentation, lft elevated on recheck several days after admission  Shock liver ? Ck wnl Negative  hepatitis panel Ab [place] concerns for acute  acalculous cholecystitis. Gi recommend gen surg consult,  HIDA scan on 10/14 GI /gen surg following    Weight loss, 20 pounds over the past month, nausea vomiting for 2 months EGD showed esophageal candidiasis, on diflucan No significant n/v currently,  GI following   AI Seen by cardiology  History of seizure disorder Continue Keppra   Psychiatric disorders: Seen by psychiatry: "Doubt dx of bipolar disease in chart (see note - no definite manic episodes, risk-taking behavior largely confined to substance use). Clear PTSD, many risk factors for BPD. Pt seems to benefit from therapeutic conversation/brief psychotherapy interventions" Start mirtazapine, stop buspirone Minimize benzodiazepine use given baseline anxiety Psych recommend:  F/u with [facility] for outpatient therapy and medication management.  She Reports chronic memory issues and anxiety, she requests his fiance stay with her at the night    Nutritional Assessment: The patient's BMI is: Body mass index is 21.35 kg/m.Marland Kitchen Seen by dietician.  I agree with the assessment and plan as outlined below: Nutrition Status: Nutrition Problem: Severe Malnutrition Etiology: chronic illness (chronic N/V) Signs/Symptoms: severe muscle depletion, severe fat depletion Interventions: Ensure Enlive (each supplement provides 350kcal and 20 grams of protein), Tube feeding  .       I have Reviewed nursing notes, Vitals, pain scores, I/o's, Lab results and  imaging results since pt's last encounter, details please see discussion above  I ordered the following labs:  Unresulted Labs (From admission, onward)     Start     Ordered   05/09/22 0500  IgG  Tomorrow morning,   R       Question:  Specimen collection method  Answer:  Unit=Unit collect   05/08/22 1727   05/09/22 0500  Anti-smooth muscle antibody, IgG  Tomorrow morning,   R       Question:  Specimen collection method  Answer:  Unit=Unit collect   05/08/22 1727   05/09/22 0500  Mitochondrial antibodies   Tomorrow morning,   R       Question:  Specimen collection method  Answer:  Unit=Unit collect   05/08/22 1727   05/09/22 0500  Ceruloplasmin  Tomorrow morning,   R       Question:  Specimen collection method  Answer:  Unit=Unit collect   05/08/22 1727   05/09/22 0500  Alpha-1-antitrypsin  Tomorrow morning,   R       Question:  Specimen collection method  Answer:  Unit=Unit collect   05/08/22 1727   05/08/22 1726  ANA  Once,   R       Question:  Specimen collection method  Answer:  Unit=Unit collect   05/08/22 1727   05/05/22 0600  Vitamin C  Once,   R        05/05/22 0600   05/05/22 0500  Zinc  Tomorrow morning,   R       Question:  Specimen collection method  Answer:  Unit=Unit collect   05/04/22 1403   05/05/22 0500  Copper, serum  Tomorrow morning,   R       Question:  Specimen collection method  Answer:  Unit=Unit collect   05/04/22 1403             DVT prophylaxis: heparin injection 5,000 Units Start: 05/01/22 2200   Code Status:   Code Status: DNR  Family Communication: Patient and fianc at bedside with permission Disposition:    Dispo: The patient is from: Home              Anticipated d/c is to: home              Anticipated d/c date is: sometime next week, pending cr and lft improvement,. Monitor volume status and bp  Antimicrobials:   Anti-infectives (From admission, onward)    Start     Dose/Rate Route Frequency Ordered Stop   05/08/22 1000  fluconazole (DIFLUCAN) tablet 200 mg       See Hyperspace for full Linked Orders Report.   200 mg Oral Daily 05/07/22 0955 05/21/22 0959   05/07/22 1045  fluconazole (DIFLUCAN) tablet 400 mg       See Hyperspace for full Linked Orders Report.   400 mg Oral  Once 05/07/22 0955 05/07/22 1037   05/03/22 1600  ceFEPIme (MAXIPIME) 2 g in sodium chloride 0.9 % 100 mL IVPB  Status:  Discontinued        2 g 200 mL/hr over 30 Minutes Intravenous Every 24 hours 05/03/22 0822 05/04/22 0811   05/02/22 1800  vancomycin  (VANCOREADY) IVPB 750 mg/150 mL        750 mg 150 mL/hr over 60 Minutes Intravenous  Once 05/02/22 1709 05/02/22 1821   05/02/22 1600  ceFEPIme (MAXIPIME) 1 g in sodium chloride 0.9 % 100 mL IVPB  Status:  Discontinued        1 g 200 mL/hr over 30 Minutes Intravenous Every 24 hours 05/01/22 1851 05/03/22 0822   05/01/22 1851  vancomycin variable dose per unstable renal function (pharmacist dosing)  Status:  Discontinued         Does not apply See admin instructions 05/01/22  1851 05/03/22 0754   05/01/22 1615  ceFEPIme (MAXIPIME) 2 g in sodium chloride 0.9 % 100 mL IVPB        2 g 200 mL/hr over 30 Minutes Intravenous  Once 05/01/22 1609 05/01/22 1650   05/01/22 1615  vancomycin (VANCOCIN) IVPB 1000 mg/200 mL premix        1,000 mg 200 mL/hr over 60 Minutes Intravenous  Once 05/01/22 1609 05/01/22 1722           Objective: Vitals:   05/09/22 0500 05/09/22 0758 05/09/22 1106 05/09/22 1535  BP:  139/88 130/76 128/82  Pulse:  68 67 71  Resp:  17 16 17   Temp:  98 F (36.7 C) 98.4 F (36.9 C) 98 F (36.7 C)  TempSrc:  Oral Oral Oral  SpO2:  96% 94% 96%  Weight: 60 kg     Height:        Intake/Output Summary (Last 24 hours) at 05/09/2022 1804 Last data filed at 05/09/2022 1738 Gross per 24 hour  Intake 261.13 ml  Output --  Net 261.13 ml   Filed Weights   05/06/22 0500 05/07/22 0359 05/09/22 0500  Weight: 53.1 kg 57.7 kg 60 kg    Examination:  General exam: thin and weak, alert, awake, communicative,calm, NAD Respiratory system: Clear to auscultation. Respiratory effort normal. Cardiovascular system:  RRR.  Gastrointestinal system: Abdomen is nondistended, soft and nontender.  Normal bowel sounds heard. Central nervous system: Alert and oriented. No focal neurological deficits. Extremities:  generalized edema ab and bilateral lower extremity  Skin: No rashes, lesions or ulcers Psychiatry: no agitation     Data Reviewed: I have personally reviewed  labs and  visualized  imaging studies since the last encounter and formulate the plan        Scheduled Meds:  cholecalciferol  1,000 Units Oral Daily   feeding supplement  237 mL Oral BID BM   fluconazole  200 mg Oral Daily   folic acid  1 mg Oral Daily   heparin  5,000 Units Subcutaneous Q8H   [START ON 05/10/2022] hydrocortisone sod succinate (SOLU-CORTEF) inj  100 mg Intravenous Daily   levETIRAcetam  500 mg Oral BID   [START ON 05/10/2022] midodrine  5 mg Oral TID WC   mirtazapine  7.5 mg Oral QHS   multivitamin with minerals  1 tablet Oral Daily   pantoprazole  80 mg Oral BID   sodium bicarbonate  1,300 mg Oral BID   thiamine  100 mg Oral Daily   Continuous Infusions:  sodium chloride Stopped (05/05/22 2130)     LOS: 8 days     2131, MD PhD FACP [facility]  Available via Epic secure chat 7am-7pm for nonurgent issues Please page for urgent issues To page the attending provider between 7A-7P or the covering provider during after hours 7P-7A, please log into the web site [email or web address] and access using universal [place] password for that web site. If you do not have the password, please call the hospital operator.    05/09/2022, 6:04 PM

## 2022-05-09 NOTE — Progress Notes (Signed)
Patient ID: Jamie Shields, female   DOB: 1980-11-21, 41 y.o.   MRN: [id number] [facility] Surgery Progress Note:   2 Days Post-Op   THE PLAN  HIDA scan currently in progress-await results.    Subjective: Mental status is clear and competent.  Complaints hungry but nauseated. Objective: Vital signs in last 24 hours: Temp:  [98 F (36.7 C)-98.9 F (37.2 C)] 98 F (36.7 C) (10/14 0758) Pulse Rate:  [54-68] 68 (10/14 0758) Resp:  [16-17] 17 (10/14 0758) BP: (133-148)/(78-88) 139/88 (10/14 0758) SpO2:  [90 %-96 %] 96 % (10/14 0758) Weight:  [60 kg] 60 kg (10/14 0500)  Intake/Output from previous day: 10/13 0701 - 10/14 0700 In: 453.4 [P.O.:240; IV Piggyback:213.4] Out: -  Intake/Output this shift: No intake/output data recorded.  Physical Exam: Work of breathing is not labored.  She is very thin.  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    Lab Results:  Results for orders placed or performed during the hospital encounter of 05/01/22 (from the past 48 hour(s))  Comprehensive metabolic panel     Status: Abnormal   Collection Time: 05/08/22  4:21 AM  Result Value Ref Range   Sodium 139 135 - 145 mmol/L   Potassium 4.1 3.5 - 5.1 mmol/L   Chloride 113 (H) 98 - 111 mmol/L   CO2 18 (L) 22 - 32 mmol/L   Glucose, Bld 107 (H) 70 - 99 mg/dL    Comment: Glucose reference range applies only to samples taken after fasting for at least 8 hours.   BUN 27 (H) 6 - 20 mg/dL   Creatinine, Ser 6.31 (H) 0.44 - 1.00 mg/dL   Calcium 7.9 (L) 8.9 - 10.3 mg/dL   Total Protein 4.9 (L) 6.5 - 8.1 g/dL   Albumin 2.7 (L) 3.5 - 5.0 g/dL   AST 497 (H) 15 - 41 U/L   ALT 169 (H) 0 - 44 U/L   Alkaline Phosphatase 209 (H) 38 - 126 U/L   Total Bilirubin 0.5 0.3 - 1.2 mg/dL   GFR, Estimated 44 (L) >60 mL/min    Comment: (NOTE) Calculated using the CKD-EPI Creatinine Equation (2021)    Anion gap 8 5 - 15    Comment: Performed at [facility], [address], , [place] [address]  CBC     Status: Abnormal   Collection Time: 05/08/22  4:21 AM  Result Value Ref Range   WBC 10.2 4.0 - 10.5 K/uL   RBC 2.50 (L) 3.87 - 5.11 MIL/uL   Hemoglobin 8.3 (L) 12.0 - 15.0 g/dL   HCT 85.8 (L) 85.0 - 27.7 %   MCV 98.8 80.0 - 100.0 fL   MCH 33.2 26.0 - 34.0 pg   MCHC 33.6 30.0 - 36.0 g/dL   RDW 41.2 (H) 87.8 - 67.6 %   Platelets 287 150 - 400 K/uL   nRBC 0.2 0.0 - 0.2 %    Comment: Performed at [facility], [address], [place], [place] [address]  Magnesium     Status: None   Collection Time: 05/08/22  4:21 AM  Result Value Ref Range   Magnesium 2.0 1.7 - 2.4 mg/dL    Comment: Performed at [facility], [address], [place], [place] [address]  Phosphorus     Status: Abnormal   Collection Time: 05/08/22  4:21 AM  Result Value Ref Range   Phosphorus 1.5 (  L) 2.5 - 4.6 mg/dL    Comment: Performed at [facility], [place] [address] [place], [place] [address]  CBC with Differential/Platelet     Status: Abnormal   Collection Time: 05/09/22  2:29 AM  Result Value Ref Range   WBC 9.9 4.0 - 10.5 K/uL   RBC 2.47 (L) 3.87 - 5.11 MIL/uL   Hemoglobin 8.2 (L) 12.0 - 15.0 g/dL   HCT 24.4 (L) 36.0 - 46.0 %   MCV 98.8 80.0 - 100.0 fL   MCH 33.2 26.0 - 34.0 pg   MCHC 33.6 30.0 - 36.0 g/dL   RDW 19.3 (H) 11.5 - 15.5 %   Platelets 277 150 - 400 K/uL   nRBC 0.0 0.0 - 0.2 %   Neutrophils Relative % 84 %   Neutro Abs 8.3 (H) 1.7 - 7.7 K/uL   Lymphocytes Relative 11 %   Lymphs Abs 1.1 0.7 - 4.0 K/uL   Monocytes Relative 4 %   Monocytes Absolute 0.4 0.1 - 1.0 K/uL   Eosinophils Relative 0 %   Eosinophils Absolute 0.0 0.0 - 0.5 K/uL   Basophils Relative 0 %   Basophils Absolute 0.0 0.0 - 0.1 K/uL   Immature Granulocytes 1 %   Abs Immature Granulocytes 0.10 (H) 0.00 - 0.07 K/uL    Comment: Performed at [facility] [address], [place], [place] [address]  Reticulocytes     Status: Abnormal   Collection Time: 05/09/22  2:29 AM   Result Value Ref Range   Retic Ct Pct 3.7 (H) 0.4 - 3.1 %   RBC. 2.47 (L) 3.87 - 5.11 MIL/uL   Retic Count, Absolute 91.1 19.0 - 186.0 K/uL   Immature Retic Fract 21.4 (H) 2.3 - 15.9 %    Comment: Performed at [facility] [address], [place], [place] [address]  Iron and TIBC     Status: None   Collection Time: 05/09/22  2:29 AM  Result Value Ref Range   Iron 35 28 - 170 ug/dL   TIBC 266 250 - 450 ug/dL   Saturation Ratios 13 10.4 - 31.8 %   UIBC 231 ug/dL    Comment: Performed at [facility], [place] [address], [place], [place] [address]  Ferritin     Status: None   Collection Time: 05/09/22  2:29 AM  Result Value Ref Range   Ferritin 144 11 - 307 ng/mL    Comment: Performed at [facility], [place] [address] [place], [place] [address]  Comprehensive metabolic panel     Status: Abnormal   Collection Time: 05/09/22  2:29 AM  Result Value Ref Range   Sodium 139 135 - 145 mmol/L   Potassium 3.8 3.5 - 5.1 mmol/L   Chloride 110 98 - 111 mmol/L   CO2 20 (L) 22 - 32 mmol/L   Glucose, Bld 112 (H) 70 - 99 mg/dL    Comment: Glucose reference range applies only to samples taken after fasting for at least 8 hours.   BUN 26 (H) 6 - 20 mg/dL   Creatinine, Ser 1.64 (H) 0.44 - 1.00 mg/dL   Calcium 8.0 (L) 8.9 - 10.3 mg/dL   Total Protein 4.7 (L) 6.5 - 8.1 g/dL   Albumin 2.6 (L) 3.5 - 5.0 g/dL   AST 194 (H) 15 - 41 U/L   ALT 206 (H) 0 - 44 U/L   Alkaline Phosphatase 225 (H) 38 - 126 U/L   Total Bilirubin  0.3 0.3 - 1.2 mg/dL   GFR, Estimated 40 (L) >60 mL/min    Comment: (NOTE) Calculated using the CKD-EPI Creatinine Equation (2021)    Anion gap 9 5 - 15    Comment: Performed at [facility], [address] [place] [address], [place], [place] [address]  CK     Status: None   Collection Time: 05/09/22  2:29 AM  Result Value Ref Range   Total CK 126 38 - 234 U/L    Comment: Performed at [facility], [address], [place], [place] [address]  Phosphorus     Status:  Abnormal   Collection Time: 05/09/22  2:29 AM  Result Value Ref Range   Phosphorus 1.7 (L) 2.5 - 4.6 mg/dL    Comment: Performed at [facility], [address] [place], [place] [address]  Hepatitis panel, acute     Status: None   Collection Time: 05/09/22  2:29 AM  Result Value Ref Range   Hepatitis B Surface Ag NON REACTIVE NON REACTIVE   HCV Ab NON REACTIVE NON REACTIVE    Comment: (NOTE) Nonreactive HCV antibody screen is consistent with no HCV infections,  unless recent infection is suspected or other evidence exists to indicate HCV infection.     Hep A IgM NON REACTIVE NON REACTIVE   Hep B C IgM NON REACTIVE NON REACTIVE    Comment: Performed at [facility], [address], [place], [place] [address]  Protime-INR     Status: None   Collection Time: 05/09/22  2:29 AM  Result Value Ref Range   Prothrombin Time 13.9 11.4 - 15.2 seconds   INR 1.1 0.8 - 1.2    Comment: (NOTE) INR goal varies based on device and disease states. Performed at [facility], [address], [place], [place] [address]     Radiology/Results: US Abdomen Limited RUQ (LIVER/GB)  Result Date: 05/07/2022 CLINICAL DATA:  [id number] Elevated liver enzymes [id number] EXAM: ULTRASOUND ABDOMEN LIMITED RIGHT UPPER QUADRANT COMPARISON:  CT abdomen pelvis 05/02/2022 FINDINGS: Gallbladder: No gallstones. Gallbladder wall thickening and pericholecystic fluid. No sonographic Murphy sign noted by sonographer. Common bile duct: Diameter: 5 mm. Liver: No focal lesion identified. Within normal limits in parenchymal echogenicity. Portal vein is patent on color Doppler imaging with normal direction of blood flow towards the liver. Other: At least small volume simple free fluid ascites. At least trace volume right pleural effusion. IMPRESSION: 1. Gallbladder wall thickening and pericholecystic fluid. Correlate clinically for acute acalculous cholecystitis. 2. At least small volume simple free fluid ascites. 3. At  least trace volume right pleural effusion. Electronically Signed   By: Tish Frederickson M.D.   On: 05/07/2022 20:24    Anti-infectives: Anti-infectives (From admission, onward)    Start     Dose/Rate Route Frequency Ordered Stop   05/08/22 1000  fluconazole (DIFLUCAN) tablet 200 mg       See Hyperspace for full Linked Orders Report.   200 mg Oral Daily 05/07/22 0955 05/21/22 0959   05/07/22 1045  fluconazole (DIFLUCAN) tablet 400 mg       See Hyperspace for full Linked Orders Report.   400 mg Oral  Once 05/07/22 0955 05/07/22 1037   05/03/22 1600  ceFEPIme (MAXIPIME) 2 g in sodium chloride 0.9 % 100 mL IVPB  Status:  Discontinued        2 g 200 mL/hr over 30 Minutes Intravenous Every 24 hours 05/03/22 0822 05/04/22 0811   05/02/22  1800  vancomycin (VANCOREADY) IVPB 750 mg/150 mL        750 mg 150 mL/hr over 60 Minutes Intravenous  Once 05/02/22 1709 05/02/22 1821   05/02/22 1600  ceFEPIme (MAXIPIME) 1 g in sodium chloride 0.9 % 100 mL IVPB  Status:  Discontinued        1 g 200 mL/hr over 30 Minutes Intravenous Every 24 hours 05/01/22 1851 05/03/22 0822   05/01/22 1851  vancomycin variable dose per unstable renal function (pharmacist dosing)  Status:  Discontinued         Does not apply See admin instructions 05/01/22 1851 05/03/22 0754   05/01/22 1615  ceFEPIme (MAXIPIME) 2 g in sodium chloride 0.9 % 100 mL IVPB        2 g 200 mL/hr over 30 Minutes Intravenous  Once 05/01/22 1609 05/01/22 1650   05/01/22 1615  vancomycin (VANCOCIN) IVPB 1000 mg/200 mL premix        1,000 mg 200 mL/hr over 60 Minutes Intravenous  Once 05/01/22 1609 05/01/22 1722       Assessment/Plan: Problem List: Patient Active Problem List   Diagnosis Date Noted   Loss of weight 05/06/2022   Normal anion gap metabolic acidosis 05/05/2022   DNR (do not resuscitate) 05/05/2022   Adrenal insufficiency (HCC) 05/05/2022   History of seizures 05/05/2022   Seizure disorder (HCC) 05/04/2022   Bipolar disease,  chronic (HCC) 05/04/2022   Hypovolemic shock (HCC) 05/01/2022   Severe dehydration 05/01/2022   Nausea and vomiting 05/01/2022   Disorders of fluid, electrolyte, and acid-base balance 05/01/2022   Hyponatremia 05/01/2022   AKI (acute kidney injury) (HCC) 05/01/2022   Severe protein-calorie malnutrition (HCC) 05/01/2022   Bursitis of hip     I inquired and discussed DNR with her.  She seems like she understands what she wants.   2 Days Post-Op    LOS: 8 days   Matt B. Daphine Deutscher, MD, [facility] [phone] to reach the surgeon on call.    05/09/2022 10:19 AM

## 2022-05-10 DIAGNOSIS — R571 Hypovolemic shock: Secondary | ICD-10-CM | POA: Diagnosis not present

## 2022-05-10 LAB — COMPREHENSIVE METABOLIC PANEL
ALT: 157 U/L — ABNORMAL HIGH (ref 0–44)
AST: 102 U/L — ABNORMAL HIGH (ref 15–41)
Albumin: 2.5 g/dL — ABNORMAL LOW (ref 3.5–5.0)
Alkaline Phosphatase: 191 U/L — ABNORMAL HIGH (ref 38–126)
Anion gap: 7 (ref 5–15)
BUN: 25 mg/dL — ABNORMAL HIGH (ref 6–20)
CO2: 21 mmol/L — ABNORMAL LOW (ref 22–32)
Calcium: 7.9 mg/dL — ABNORMAL LOW (ref 8.9–10.3)
Chloride: 114 mmol/L — ABNORMAL HIGH (ref 98–111)
Creatinine, Ser: 1.54 mg/dL — ABNORMAL HIGH (ref 0.44–1.00)
GFR, Estimated: 44 mL/min — ABNORMAL LOW (ref >60)
Glucose, Bld: 107 mg/dL — ABNORMAL HIGH (ref 70–99)
Potassium: 3.1 mmol/L — ABNORMAL LOW (ref 3.5–5.1)
Sodium: 142 mmol/L (ref 135–145)
Total Bilirubin: 0.3 mg/dL (ref 0.3–1.2)
Total Protein: 4.5 g/dL — ABNORMAL LOW (ref 6.5–8.1)

## 2022-05-10 LAB — PHOSPHORUS: Phosphorus: 2.2 mg/dL — ABNORMAL LOW (ref 2.5–4.6)

## 2022-05-10 LAB — CERULOPLASMIN: Ceruloplasmin: 17.9 mg/dL — ABNORMAL LOW (ref 19.0–39.0)

## 2022-05-10 LAB — IGG: IgG (Immunoglobin G), Serum: 386 mg/dL — ABNORMAL LOW (ref 586–1602)

## 2022-05-10 LAB — ANA: Anti Nuclear Antibody (ANA): NEGATIVE

## 2022-05-10 LAB — ALPHA-1-ANTITRYPSIN: A-1 Antitrypsin, Ser: 96 mg/dL — ABNORMAL LOW (ref 100–188)

## 2022-05-10 MED ORDER — K PHOS MONO-SOD PHOS DI & MONO 155-852-130 MG PO TABS
500.0000 mg | ORAL_TABLET | Freq: Two times a day (BID) | ORAL | Status: DC
  Administered 2022-05-10 – 2022-05-11 (×3): 500 mg via ORAL
  Filled 2022-05-10 (×4): qty 2

## 2022-05-10 MED ORDER — HYDROCORTISONE SOD SUC (PF) 100 MG IJ SOLR
50.0000 mg | Freq: Every day | INTRAMUSCULAR | Status: DC
  Administered 2022-05-11 – 2022-05-14 (×4): 50 mg via INTRAVENOUS
  Filled 2022-05-10 (×5): qty 1

## 2022-05-10 MED ORDER — MIDODRINE HCL 5 MG PO TABS
2.5000 mg | ORAL_TABLET | Freq: Three times a day (TID) | ORAL | Status: DC
  Administered 2022-05-10 – 2022-05-14 (×13): 2.5 mg via ORAL
  Filled 2022-05-10 (×13): qty 1

## 2022-05-10 MED ORDER — FUROSEMIDE 10 MG/ML IJ SOLN
40.0000 mg | Freq: Once | INTRAMUSCULAR | Status: AC
  Administered 2022-05-10: 40 mg via INTRAVENOUS
  Filled 2022-05-10: qty 4

## 2022-05-10 NOTE — Progress Notes (Signed)
PROGRESS NOTE    Jamie Shields  GYI:[id number] DOB: [birth year] DOA: 05/01/2022 PCP: Fleet Contras, MD     Brief Narrative:   41 year old woman with history of IV drug use now in remission , h/o seizure disorder on keppra, presents with 4 to 8 weeks of nausea vomiting and failure to thrive found to have hypovolemic shock, aki, metabolic derangements.  Subjective:  No acute event reported overnight  Appears stronger, report being swollen, legs are heavy, ab is tight Blood pressure has improved, systolic blood pressure around 130    Assessment & Plan:  Principal Problem:   Hypovolemic shock (HCC) Active Problems:   Severe dehydration   Hyponatremia   AKI (acute kidney injury) (HCC)   Nausea and vomiting   Disorders of fluid, electrolyte, and acid-base balance   Severe protein-calorie malnutrition (HCC)   Seizure disorder (HCC)   Bipolar disease, chronic (HCC)   Normal anion gap metabolic acidosis   DNR (do not resuscitate)   Adrenal insufficiency (HCC)   History of seizures   Loss of weight   LFTs abnormal  Hypovolemic shock vs adrenal insufficiency , questionable ( I do not see cortisol level or cosyntropin stim test, she is already on Solu-Cortef, adrenal gland unremarkable on CT scan) Off pressors Currently on midodrine and Solu-cortef, bp start to trend up, slow taper Now appear volume overloaded, trial dose lasix given on 10/15, monitor bp/cr /lyts  AKI/metabolic acidosis Cr wnl in 2021 History of NSAIDs use Continue sodium bicarb supplement Cr trending down Bladder scan PVR 146cc, increase activity, prevent constipation, she states she urinates fine  Hypophosphatemia/ hypokalemia Remain low , continue to replace , recheck in the morning Encourage oral intake  LFT Lft was normal on presentation, lft elevated on recheck several days after admission  Ck wnl Negative  hepatitis panel Ab [place] concerns for acute  acalculous cholecystitis. HIDA scan did not  suggest cholecystitis  Shock liver ? Liver congestion? She does appear significantly volume overloaded , started lasix, monitor lft GI /gen surg following    Weight loss, 20 pounds over the past month, nausea vomiting for 2 months EGD showed esophageal candidiasis, on diflucan No significant n/v currently,  GI following   AI Seen by cardiology  History of seizure disorder Continue Keppra   Psychiatric disorders: Seen by psychiatry: "Doubt dx of bipolar disease in chart (see note - no definite manic episodes, risk-taking behavior largely confined to substance use). Clear PTSD, many risk factors for BPD. Pt seems to benefit from therapeutic conversation/brief psychotherapy interventions" Start mirtazapine, stop buspirone Minimize benzodiazepine use given baseline anxiety Psych recommend:  F/u with [facility] for outpatient therapy and medication management.  She Reports chronic memory issues and anxiety, she requests his fiance stay with her at the night    Nutritional Assessment: The patient's BMI is: Body mass index is 21.35 kg/m.[place] Kitchen Seen by dietician.  I agree with the assessment and plan as outlined below: Nutrition Status: Nutrition Problem: Severe Malnutrition Etiology: chronic illness (chronic N/V) Signs/Symptoms: severe muscle depletion, severe fat depletion Interventions: Ensure Enlive (each supplement provides 350kcal and 20 grams of protein), Tube feeding  .       I have Reviewed nursing notes, Vitals, pain scores, I/o's, Lab results and  imaging results since pt's last encounter, details please see discussion above  I ordered the following labs:  Unresulted Labs (From admission, onward)     Start     Ordered   05/11/22 0500  CBC  with Differential/Platelet  Tomorrow morning,   R       Question:  Specimen collection method  Answer:  Unit=Unit collect   05/10/22 0851   05/11/22 0500  Phosphorus  Tomorrow morning,   R        Question:  Specimen collection method  Answer:  Unit=Unit collect   05/10/22 0851   05/11/22 0500  Magnesium  Tomorrow morning,   R       Question:  Specimen collection method  Answer:  Unit=Unit collect   05/10/22 0851   05/10/22 0500  Comprehensive metabolic panel  Daily at 5am,   R     Question:  Specimen collection method  Answer:  Unit=Unit collect   05/09/22 1814   05/09/22 0500  IgG  Tomorrow morning,   R       Question:  Specimen collection method  Answer:  Unit=Unit collect   05/08/22 1727   05/09/22 0500  Anti-smooth muscle antibody, IgG  Tomorrow morning,   R       Question:  Specimen collection method  Answer:  Unit=Unit collect   05/08/22 1727   05/09/22 0500  Mitochondrial antibodies  Tomorrow morning,   R       Question:  Specimen collection method  Answer:  Unit=Unit collect   05/08/22 1727   05/09/22 0500  Ceruloplasmin  Tomorrow morning,   R       Question:  Specimen collection method  Answer:  Unit=Unit collect   05/08/22 1727   05/09/22 0500  Alpha-1-antitrypsin  Tomorrow morning,   R       Question:  Specimen collection method  Answer:  Unit=Unit collect   05/08/22 1727   05/08/22 1726  ANA  Once,   R       Question:  Specimen collection method  Answer:  Unit=Unit collect   05/08/22 1727   05/05/22 0600  Vitamin C  Once,   R        05/05/22 0600   05/05/22 0500  Zinc  Tomorrow morning,   R       Question:  Specimen collection method  Answer:  Unit=Unit collect   05/04/22 1403   05/05/22 0500  Copper, serum  Tomorrow morning,   R       Question:  Specimen collection method  Answer:  Unit=Unit collect   05/04/22 1403             DVT prophylaxis: heparin injection 5,000 Units Start: 05/01/22 2200   Code Status:   Code Status: DNR  Family Communication: Patient  Disposition:    Dispo: The patient is from: Home              Anticipated d/c is to: home              Anticipated d/c date is: sometime next week, wean midodrine and solu-cortef, , need  cr and lft to improve, monitor effect of lasix, Monitor volume status and bp  Antimicrobials:   Anti-infectives (From admission, onward)    Start     Dose/Rate Route Frequency Ordered Stop   05/08/22 1000  fluconazole (DIFLUCAN) tablet 200 mg       See Hyperspace for full Linked Orders Report.   200 mg Oral Daily 05/07/22 0955 05/21/22 0959   05/07/22 1045  fluconazole (DIFLUCAN) tablet 400 mg       See Hyperspace for full Linked Orders Report.   400 mg Oral  Once 05/07/22 0955 05/07/22 1037  05/03/22 1600  ceFEPIme (MAXIPIME) 2 g in sodium chloride 0.9 % 100 mL IVPB  Status:  Discontinued        2 g 200 mL/hr over 30 Minutes Intravenous Every 24 hours 05/03/22 0822 05/04/22 0811   05/02/22 1800  vancomycin (VANCOREADY) IVPB 750 mg/150 mL        750 mg 150 mL/hr over 60 Minutes Intravenous  Once 05/02/22 1709 05/02/22 1821   05/02/22 1600  ceFEPIme (MAXIPIME) 1 g in sodium chloride 0.9 % 100 mL IVPB  Status:  Discontinued        1 g 200 mL/hr over 30 Minutes Intravenous Every 24 hours 05/01/22 1851 05/03/22 0822   05/01/22 1851  vancomycin variable dose per unstable renal function (pharmacist dosing)  Status:  Discontinued         Does not apply See admin instructions 05/01/22 1851 05/03/22 0754   05/01/22 1615  ceFEPIme (MAXIPIME) 2 g in sodium chloride 0.9 % 100 mL IVPB        2 g 200 mL/hr over 30 Minutes Intravenous  Once 05/01/22 1609 05/01/22 1650   05/01/22 1615  vancomycin (VANCOCIN) IVPB 1000 mg/200 mL premix        1,000 mg 200 mL/hr over 60 Minutes Intravenous  Once 05/01/22 1609 05/01/22 1722           Objective: Vitals:   05/09/22 1535 05/09/22 2020 05/10/22 0531 05/10/22 0834  BP: 128/82 (!) 142/83 120/75 128/83  Pulse: 71 79 69 71  Resp: 17 17 19 18   Temp: 98 F (36.7 C) 99 F (37.2 C) 97.8 F (36.6 C) 97.9 F (36.6 C)  TempSrc: Oral Oral Oral Oral  SpO2: 96% 94% 100% 100%  Weight:      Height:        Intake/Output Summary (Last 24 hours) at  05/10/2022 0855 Last data filed at 05/09/2022 1900 Gross per 24 hour  Intake 981.13 ml  Output --  Net 981.13 ml   Filed Weights   05/06/22 0500 05/07/22 0359 05/09/22 0500  Weight: 53.1 kg 57.7 kg 60 kg    Examination:  General exam: appear stronger, pleasant, NAD Respiratory system: Clear to auscultation. Respiratory effort normal. Cardiovascular system:  RRR.  Gastrointestinal system: Abdomen is tight and distended, nontender.  Normal bowel sounds heard. Central nervous system: Alert and oriented. No focal neurological deficits. Extremities:  generalized edema ab and bilateral lower extremity  Skin: No rashes, lesions or ulcers Psychiatry: no agitation     Data Reviewed: I have personally reviewed  labs and visualized  imaging studies since the last encounter and formulate the plan        Scheduled Meds:  cholecalciferol  1,000 Units Oral Daily   feeding supplement  237 mL Oral BID BM   fluconazole  200 mg Oral Daily   folic acid  1 mg Oral Daily   heparin  5,000 Units Subcutaneous Q8H   [START ON 05/11/2022] hydrocortisone sod succinate (SOLU-CORTEF) inj  50 mg Intravenous Daily   levETIRAcetam  500 mg Oral BID   midodrine  2.5 mg Oral TID WC   mirtazapine  7.5 mg Oral QHS   multivitamin with minerals  1 tablet Oral Daily   pantoprazole  80 mg Oral BID   phosphorus  500 mg Oral BID   sodium bicarbonate  1,300 mg Oral BID   Continuous Infusions:  sodium chloride Stopped (05/05/22 2130)     LOS: 9 days     07/05/22, MD  PhD FACP [facility]  Available via Epic secure chat 7am-7pm for nonurgent issues Please page for urgent issues To page the attending provider between 7A-7P or the covering provider during after hours 7P-7A, please log into the web site [email or web address] and access using universal [place] password for that web site. If you do not have the password, please call the hospital operator.    05/10/2022, 8:55 AM

## 2022-05-10 NOTE — Progress Notes (Signed)
Patient ID: Jamie Shields, female   DOB: 12-03-1980, 41 y.o.   MRN: [id number] [facility] Progress Note:   3 Days Post-Op   THE PLAN  Would agree with Dr. Loletha Carrow about trying to feed her.  She may have vascular thrombosis/Budd Chiari producing the hunger yet vomiting.  HIDA was negative  Subjective: Mental status is clear and more pleasant.  Complaints none. Objective: Vital signs in last 24 hours: Temp:  [97.8 F (36.6 C)-99 F (37.2 C)] 97.9 F (36.6 C) (10/15 0834) Pulse Rate:  [67-79] 71 (10/15 0834) Resp:  [16-19] 18 (10/15 0834) BP: (120-142)/(75-83) 128/83 (10/15 0834) SpO2:  [94 %-100 %] 100 % (10/15 0834)  Intake/Output from previous day: 10/14 0701 - 10/15 0700 In: 981.1 [P.O.:720; IV Piggyback:261.1] Out: -  Intake/Output this shift: No intake/output data recorded.  Physical Exam: Work of breathing is normal.  Trying to eat breakfast  Lab Results:  Results for orders placed or performed during the hospital encounter of 05/01/22 (from the past 48 hour(s))  CBC with Differential/Platelet     Status: Abnormal   Collection Time: 05/09/22  2:29 AM  Result Value Ref Range   WBC 9.9 4.0 - 10.5 K/uL   RBC 2.47 (L) 3.87 - 5.11 MIL/uL   Hemoglobin 8.2 (L) 12.0 - 15.0 g/dL   HCT 24.4 (L) 36.0 - 46.0 %   MCV 98.8 80.0 - 100.0 fL   MCH 33.2 26.0 - 34.0 pg   MCHC 33.6 30.0 - 36.0 g/dL   RDW 19.3 (H) 11.5 - 15.5 %   Platelets 277 150 - 400 K/uL   nRBC 0.0 0.0 - 0.2 %   Neutrophils Relative % 84 %   Neutro Abs 8.3 (H) 1.7 - 7.7 K/uL   Lymphocytes Relative 11 %   Lymphs Abs 1.1 0.7 - 4.0 K/uL   Monocytes Relative 4 %   Monocytes Absolute 0.4 0.1 - 1.0 K/uL   Eosinophils Relative 0 %   Eosinophils Absolute 0.0 0.0 - 0.5 K/uL   Basophils Relative 0 %   Basophils Absolute 0.0 0.0 - 0.1 K/uL   Immature Granulocytes 1 %   Abs Immature Granulocytes 0.10 (H) 0.00 - 0.07 K/uL    Comment: Performed at [facility], [address] [place], [place] [address]   Reticulocytes     Status: Abnormal   Collection Time: 05/09/22  2:29 AM  Result Value Ref Range   Retic Ct Pct 3.7 (H) 0.4 - 3.1 %   RBC. 2.47 (L) 3.87 - 5.11 MIL/uL   Retic Count, Absolute 91.1 19.0 - 186.0 K/uL   Immature Retic Fract 21.4 (H) 2.3 - 15.9 %    Comment: Performed at [facility] [address], [place], [place] [address]  Iron and TIBC     Status: None   Collection Time: 05/09/22  2:29 AM  Result Value Ref Range   Iron 35 28 - 170 ug/dL   TIBC 266 250 - 450 ug/dL   Saturation Ratios 13 10.4 - 31.8 %   UIBC 231 ug/dL    Comment: Performed at [facility], [place] [address] [place], [place] [address]  Ferritin     Status: None   Collection Time: 05/09/22  2:29 AM  Result Value Ref Range   Ferritin 144 11 - 307 ng/mL    Comment: Performed at [facility], [place] [address], [place], [place] [address]  Comprehensive metabolic panel     Status:  Abnormal   Collection Time: 05/09/22  2:29 AM  Result Value Ref Range   Sodium 139 135 - 145 mmol/L   Potassium 3.8 3.5 - 5.1 mmol/L   Chloride 110 98 - 111 mmol/L   CO2 20 (L) 22 - 32 mmol/L   Glucose, Bld 112 (H) 70 - 99 mg/dL    Comment: Glucose reference range applies only to samples taken after fasting for at least 8 hours.   BUN 26 (H) 6 - 20 mg/dL   Creatinine, Ser 7.25 (H) 0.44 - 1.00 mg/dL   Calcium 8.0 (L) 8.9 - 10.3 mg/dL   Total Protein 4.7 (L) 6.5 - 8.1 g/dL   Albumin 2.6 (L) 3.5 - 5.0 g/dL   AST 366 (H) 15 - 41 U/L   ALT 206 (H) 0 - 44 U/L   Alkaline Phosphatase 225 (H) 38 - 126 U/L   Total Bilirubin 0.3 0.3 - 1.2 mg/dL   GFR, Estimated 40 (L) >60 mL/min    Comment: (NOTE) Calculated using the CKD-EPI Creatinine Equation (2021)    Anion gap 9 5 - 15    Comment: Performed at [facility], [address] [place], [place] [address]  CK     Status: None   Collection Time: 05/09/22  2:29 AM  Result Value Ref Range   Total CK 126 38 - 234 U/L    Comment: Performed at [facility], [address] [place], [place] [address]  Phosphorus     Status: Abnormal   Collection Time: 05/09/22  2:29 AM  Result Value Ref Range   Phosphorus 1.7 (L) 2.5 - 4.6 mg/dL    Comment: Performed at [facility], [address] [place], [place] [address]  Hepatitis panel, acute     Status: None   Collection Time: 05/09/22  2:29 AM  Result Value Ref Range   Hepatitis B Surface Ag NON REACTIVE NON REACTIVE   HCV Ab NON REACTIVE NON REACTIVE    Comment: (NOTE) Nonreactive HCV antibody screen is consistent with no HCV infections,  unless recent infection is suspected or other evidence exists to indicate HCV infection.     Hep A IgM NON REACTIVE NON REACTIVE   Hep B C IgM NON REACTIVE NON REACTIVE    Comment: Performed at [facility], [address], [place], [place] [address]  Protime-INR     Status: None   Collection Time: 05/09/22  2:29 AM  Result Value Ref Range   Prothrombin Time 13.9 11.4 - 15.2 seconds   INR 1.1 0.8 - 1.2    Comment: (NOTE) INR goal varies based on device and disease states. Performed at [facility], [address] [place], [place] [address]   Comprehensive metabolic panel     Status: Abnormal   Collection Time: 05/10/22  4:02 AM  Result Value Ref Range   Sodium 142 135 - 145 mmol/L   Potassium 3.1 (L) 3.5 - 5.1 mmol/L   Chloride 114 (H) 98 - 111 mmol/L   CO2 21 (L) 22 - 32 mmol/L   Glucose, Bld 107 (H) 70 - 99 mg/dL    Comment: Glucose reference range applies only to samples taken after fasting for at least 8 hours.   BUN 25 (H) 6 - 20 mg/dL   Creatinine, Ser 8.41 (H) 0.44 - 1.00 mg/dL   Calcium 7.9 (L) 8.9 - 10.3 mg/dL   Total Protein 4.5 (L) 6.5 - 8.1 g/dL  Albumin 2.5 (L) 3.5 - 5.0 g/dL   AST 657 (H) 15 - 41 U/L   ALT 157 (H) 0 - 44 U/L   Alkaline Phosphatase 191 (H) 38 - 126 U/L   Total Bilirubin 0.3 0.3 - 1.2 mg/dL   GFR, Estimated 44 (L) >60 mL/min    Comment: (NOTE) Calculated using the CKD-EPI Creatinine Equation  (2021)    Anion gap 7 5 - 15    Comment: Performed at [facility], [address] [place], [place] [address]  Phosphorus     Status: Abnormal   Collection Time: 05/10/22  4:02 AM  Result Value Ref Range   Phosphorus 2.2 (L) 2.5 - 4.6 mg/dL    Comment: Performed at [facility], [address], [place], [place] [address]    Radiology/Results: NM Hepatobiliary Liver Func  Result Date: 05/09/2022 CLINICAL DATA:  Right upper quadrant pain EXAM: NUCLEAR MEDICINE HEPATOBILIARY IMAGING TECHNIQUE: Sequential images of the abdomen were obtained out to 60 minutes following intravenous administration of radiopharmaceutical. RADIOPHARMACEUTICALS:  5.5 mCi Tc-60m  Choletec IV COMPARISON:  CT 05/02/2022, ultrasound 05/07/2022 FINDINGS: Prompt uptake and biliary excretion of activity by the liver is seen. Gallbladder activity is visualized, consistent with patency of cystic duct. Biliary activity passes into small bowel, consistent with patent common bile duct. IMPRESSION: No scintigraphic evidence of acute cholecystitis. Electronically Signed   By: Duanne Guess D.O.   On: 05/09/2022 12:19    Anti-infectives: Anti-infectives (From admission, onward)    Start     Dose/Rate Route Frequency Ordered Stop   05/08/22 1000  fluconazole (DIFLUCAN) tablet 200 mg       See Hyperspace for full Linked Orders Report.   200 mg Oral Daily 05/07/22 0955 05/21/22 0959   05/07/22 1045  fluconazole (DIFLUCAN) tablet 400 mg       See Hyperspace for full Linked Orders Report.   400 mg Oral  Once 05/07/22 0955 05/07/22 1037   05/03/22 1600  ceFEPIme (MAXIPIME) 2 g in sodium chloride 0.9 % 100 mL IVPB  Status:  Discontinued        2 g 200 mL/hr over 30 Minutes Intravenous Every 24 hours 05/03/22 0822 05/04/22 0811   05/02/22 1800  vancomycin (VANCOREADY) IVPB 750 mg/150 mL        750 mg 150 mL/hr over 60 Minutes Intravenous  Once 05/02/22 1709 05/02/22 1821   05/02/22 1600  ceFEPIme (MAXIPIME) 1 g in  sodium chloride 0.9 % 100 mL IVPB  Status:  Discontinued        1 g 200 mL/hr over 30 Minutes Intravenous Every 24 hours 05/01/22 1851 05/03/22 0822   05/01/22 1851  vancomycin variable dose per unstable renal function (pharmacist dosing)  Status:  Discontinued         Does not apply See admin instructions 05/01/22 1851 05/03/22 0754   05/01/22 1615  ceFEPIme (MAXIPIME) 2 g in sodium chloride 0.9 % 100 mL IVPB        2 g 200 mL/hr over 30 Minutes Intravenous  Once 05/01/22 1609 05/01/22 1650   05/01/22 1615  vancomycin (VANCOCIN) IVPB 1000 mg/200 mL premix        1,000 mg 200 mL/hr over 60 Minutes Intravenous  Once 05/01/22 1609 05/01/22 1722       Assessment/Plan: Problem List: Patient Active Problem List   Diagnosis Date Noted   LFTs abnormal    Loss of weight 05/06/2022   Normal anion gap metabolic acidosis 05/05/2022  DNR (do not resuscitate) 05/05/2022   Adrenal insufficiency (HCC) 05/05/2022   History of seizures 05/05/2022   Seizure disorder (HCC) 05/04/2022   Bipolar disease, chronic (HCC) 05/04/2022   Hypovolemic shock (HCC) 05/01/2022   Severe dehydration 05/01/2022   Nausea and vomiting 05/01/2022   Disorders of fluid, electrolyte, and acid-base balance 05/01/2022   Hyponatremia 05/01/2022   AKI (acute kidney injury) (HCC) 05/01/2022   Severe protein-calorie malnutrition (HCC) 05/01/2022   Bursitis of hip     Observation for now.   3 Days Post-Op    LOS: 9 days   Matt B. Daphine Deutscher, MD, [facility] [phone] to reach the surgeon on call.    05/10/2022 8:48 AM

## 2022-05-11 DIAGNOSIS — B3781 Candidal esophagitis: Secondary | ICD-10-CM

## 2022-05-11 LAB — COMPREHENSIVE METABOLIC PANEL
ALT: 150 U/L — ABNORMAL HIGH (ref 0–44)
AST: 81 U/L — ABNORMAL HIGH (ref 15–41)
Albumin: 2.5 g/dL — ABNORMAL LOW (ref 3.5–5.0)
Alkaline Phosphatase: 184 U/L — ABNORMAL HIGH (ref 38–126)
Anion gap: 10 (ref 5–15)
BUN: 23 mg/dL — ABNORMAL HIGH (ref 6–20)
CO2: 23 mmol/L (ref 22–32)
Calcium: 7.8 mg/dL — ABNORMAL LOW (ref 8.9–10.3)
Chloride: 109 mmol/L (ref 98–111)
Creatinine, Ser: 1.23 mg/dL — ABNORMAL HIGH (ref 0.44–1.00)
GFR, Estimated: 57 mL/min — ABNORMAL LOW (ref >60)
Glucose, Bld: 97 mg/dL (ref 70–99)
Potassium: 2.8 mmol/L — ABNORMAL LOW (ref 3.5–5.1)
Sodium: 142 mmol/L (ref 135–145)
Total Bilirubin: 0.4 mg/dL (ref 0.3–1.2)
Total Protein: 4.6 g/dL — ABNORMAL LOW (ref 6.5–8.1)

## 2022-05-11 LAB — CBC WITH DIFFERENTIAL/PLATELET
Abs Immature Granulocytes: 0.06 10*3/uL (ref 0.00–0.07)
Basophils Absolute: 0 10*3/uL (ref 0.0–0.1)
Basophils Relative: 0 %
Eosinophils Absolute: 0.1 10*3/uL (ref 0.0–0.5)
Eosinophils Relative: 1 %
HCT: 23.8 % — ABNORMAL LOW (ref 36.0–46.0)
Hemoglobin: 7.9 g/dL — ABNORMAL LOW (ref 12.0–15.0)
Immature Granulocytes: 1 %
Lymphocytes Relative: 14 %
Lymphs Abs: 1.5 10*3/uL (ref 0.7–4.0)
MCH: 33.1 pg (ref 26.0–34.0)
MCHC: 33.2 g/dL (ref 30.0–36.0)
MCV: 99.6 fL (ref 80.0–100.0)
Monocytes Absolute: 0.6 10*3/uL (ref 0.1–1.0)
Monocytes Relative: 6 %
Neutro Abs: 8.2 10*3/uL — ABNORMAL HIGH (ref 1.7–7.7)
Neutrophils Relative %: 78 %
Platelets: 279 10*3/uL (ref 150–400)
RBC: 2.39 MIL/uL — ABNORMAL LOW (ref 3.87–5.11)
RDW: 19.4 % — ABNORMAL HIGH (ref 11.5–15.5)
WBC: 10.5 10*3/uL (ref 4.0–10.5)
nRBC: 0 % (ref 0.0–0.2)

## 2022-05-11 LAB — MITOCHONDRIAL ANTIBODIES: Mitochondrial M2 Ab, IgG: <20 Units (ref 0.0–20.0)

## 2022-05-11 LAB — VITAMIN C: Vitamin C: 0.1 mg/dL — ABNORMAL LOW (ref 0.4–2.0)

## 2022-05-11 LAB — PHOSPHORUS: Phosphorus: 2.9 mg/dL (ref 2.5–4.6)

## 2022-05-11 LAB — ANTI-SMOOTH MUSCLE ANTIBODY, IGG: F-Actin IgG: 46 Units — ABNORMAL HIGH (ref 0–19)

## 2022-05-11 LAB — MAGNESIUM: Magnesium: 1.4 mg/dL — ABNORMAL LOW (ref 1.7–2.4)

## 2022-05-11 MED ORDER — MIRTAZAPINE 15 MG PO TABS
15.0000 mg | ORAL_TABLET | Freq: Every day | ORAL | Status: DC
  Administered 2022-05-11 – 2022-05-13 (×3): 15 mg via ORAL
  Filled 2022-05-11 (×3): qty 1

## 2022-05-11 MED ORDER — PANTOPRAZOLE SODIUM 40 MG PO TBEC
40.0000 mg | DELAYED_RELEASE_TABLET | Freq: Every day | ORAL | Status: DC
  Administered 2022-05-12 – 2022-05-14 (×3): 40 mg via ORAL
  Filled 2022-05-11 (×3): qty 1

## 2022-05-11 MED ORDER — VITAMIN C 500 MG PO TABS
250.0000 mg | ORAL_TABLET | Freq: Two times a day (BID) | ORAL | Status: DC
  Administered 2022-05-11 – 2022-05-14 (×7): 250 mg via ORAL
  Filled 2022-05-11 (×7): qty 1

## 2022-05-11 MED ORDER — VITAMIN A 3 MG (10000 UNIT) PO CAPS
10000.0000 [IU] | ORAL_CAPSULE | Freq: Every day | ORAL | Status: DC
  Administered 2022-05-11 – 2022-05-14 (×4): 10000 [IU] via ORAL
  Filled 2022-05-11 (×4): qty 1

## 2022-05-11 MED ORDER — CHLORHEXIDINE GLUCONATE CLOTH 2 % EX PADS
6.0000 | MEDICATED_PAD | Freq: Every day | CUTANEOUS | Status: DC
  Administered 2022-05-11 – 2022-05-14 (×4): 6 via TOPICAL

## 2022-05-11 MED ORDER — POTASSIUM CHLORIDE CRYS ER 20 MEQ PO TBCR
40.0000 meq | EXTENDED_RELEASE_TABLET | ORAL | Status: AC
  Administered 2022-05-11 (×2): 40 meq via ORAL
  Filled 2022-05-11: qty 2

## 2022-05-11 MED ORDER — SODIUM CHLORIDE 0.9% FLUSH
10.0000 mL | INTRAVENOUS | Status: DC | PRN
  Administered 2022-05-12: 10 mL

## 2022-05-11 MED ORDER — SODIUM CHLORIDE 0.9% FLUSH
10.0000 mL | Freq: Two times a day (BID) | INTRAVENOUS | Status: DC
  Administered 2022-05-11 – 2022-05-14 (×6): 10 mL

## 2022-05-11 MED ORDER — MAGNESIUM SULFATE 4 GM/100ML IV SOLN
4.0000 g | Freq: Once | INTRAVENOUS | Status: AC
  Administered 2022-05-11: 4 g via INTRAVENOUS
  Filled 2022-05-11: qty 100

## 2022-05-11 NOTE — Progress Notes (Addendum)
Daily Rounding Note  05/11/2022, 1:24 PM  LOS: 10 days   SUBJECTIVE:   Chief complaint: Longstanding nausea, anorexia, wt loss, FTT     Complains about pain starting in the lower sternal region and traveling all the way into her legs.  She cannot tell me how long its been present or whether it was present before she was admitted almost 2 weeks ago.  Her back is also hurting.  I asked her if she had been moving around or walking in the hallway and she said she will not walk in the hallway because "people are stupid", I did not ask her to elaborate. Nausea is better.  She is tolerating solid food.  In prior days she had been eating maybe 10 to 15% at most 40% of her meals but yesterday ate 100% of meals. Had a bowel movement yesterday.  OBJECTIVE:         Vital signs in last 24 hours:    Temp:  [97.9 F (36.6 C)-99.6 F (37.6 C)] 97.9 F (36.6 C) (10/16 0755) Pulse Rate:  [62-71] 71 (10/16 0755) Resp:  [16-18] 18 (10/16 0755) BP: (133-158)/(75-91) 133/77 (10/16 0755) SpO2:  [100 %] 100 % (10/16 0755) Last BM Date : 05/10/22 Filed Weights   05/06/22 0500 05/07/22 0359 05/09/22 0500  Weight: 53.1 kg 57.7 kg 60 kg   General: Thin.  Looks somewhat chronically ill.  Fully alert. Heart: RRR. Chest: No labored breathing or cough. Abdomen: Soft.  Not distended.  Although she says it hurts when moderate pressure is applied to the right and left abdomen.  There is absolutely no guarding or rebound.  Exam is generally benign with active bowel sounds. Extremities: Slight lower extremity edema. Neuro/Psych: Irritated.  Oriented x3.  Poor memory in general as she cannot tell you for how long she has had her many complaints.  Intake/Output from previous day: 10/15 0701 - 10/16 0700 In: 480 [P.O.:480] Out: -   Intake/Output this shift: No intake/output data recorded.  Lab Results: Recent Labs    05/09/22 0229 05/11/22 0219   WBC 9.9 10.5  HGB 8.2* 7.9*  HCT 24.4* 23.8*  PLT 277 279   BMET Recent Labs    05/09/22 0229 05/10/22 0402 05/11/22 0219  NA 139 142 142  K 3.8 3.1* 2.8*  CL 110 114* 109  CO2 20* 21* 23  GLUCOSE 112* 107* 97  BUN 26* 25* 23*  CREATININE 1.64* 1.54* 1.23*  CALCIUM 8.0* 7.9* 7.8*   LFT Recent Labs    05/09/22 0229 05/10/22 0402 05/11/22 0219  PROT 4.7* 4.5* 4.6*  ALBUMIN 2.6* 2.5* 2.5*  AST 194* 102* 81*  ALT 206* 157* 150*  ALKPHOS 225* 191* 184*  BILITOT 0.3 0.3 0.4   PT/INR Recent Labs    05/09/22 0229  LABPROT 13.9  INR 1.1   Hepatitis Panel Recent Labs    05/09/22 0229  HEPBSAG NON REACTIVE  HCVAB NON REACTIVE  HEPAIGM NON REACTIVE  HEPBIGM NON REACTIVE    Studies/Results: No results found.  Scheduled Meds:  ascorbic acid  250 mg Oral BID   cholecalciferol  1,000 Units Oral Daily   feeding supplement  237 mL Oral BID BM   fluconazole  200 mg Oral Daily   folic acid  1 mg Oral Daily   heparin  5,000 Units Subcutaneous Q8H   hydrocortisone sod succinate (SOLU-CORTEF) inj  50 mg Intravenous Daily   levETIRAcetam  500 mg  Oral BID   midodrine  2.5 mg Oral TID WC   mirtazapine  15 mg Oral QHS   multivitamin with minerals  1 tablet Oral Daily   pantoprazole  80 mg Oral BID   potassium chloride  40 mEq Oral Q2H   sodium bicarbonate  1,300 mg Oral BID   vitamin A  10,000 Units Oral Daily   Continuous Infusions:  sodium chloride Stopped (05/05/22 2130)   magnesium sulfate bolus IVPB     PRN Meds:.acetaminophen, diphenhydrAMINE, docusate sodium, hydrOXYzine, lidocaine, melatonin, morphine injection, ondansetron, mouth rinse, polyethylene glycol   ASSESMENT:     N/V.  Wt loss.  Severe Malnutrition.  Elevated LFTs.  Suspect behavioral health component of her symptoms and findings. 05/07/2022 EGD: 3 cm HH.  Esophageal plaques consistent with Candida.  Normal esophagus, stomach, and duodenum.  Biopsies obtained with pathology showing: in  duodenum: reactive duodenal mucosa, prominent Brunner's glands and focal gastric metaplasia compatible with peptic duodenitis.  There is also small area of acute inflammation and Candida.  Gastric biopsies  showed reactive gastropathy, no H. pylori, metaplasia, dysplasia, carcinoma.  Pathologist's note does mention that the minute, detached superficial fragment of squamous epithelial showing acute inflammation and Candida likely represents oropharyngeal or esophageal contaminant/carryover. Non contrast CTAP: limited by motion: GB distention wo stones, volume overload w small L pleural effusion, pericolic gutter fat stranding, anasarca.  Abd [place] w thick GB wall, pericholcystic fluid but HIDA normal.   T bili not/never elevated.  Alk phos 83... 225... 184.  AST/AST 19/10... 220/206... 81/150 Acute hepatitis panel all negative/nonreactive. Ceruloplasmin low at 17.9.  Alpha 1 antitrypsin low at 96.  Smooth muscle antibody elevated at 46.  IgG low at 386. Day 4/14 po Diflucan.  Ongoing Protonix 80 mg po bid.      Adrenal insufficiency?Marland Kitchen   Solucortef in place.      Hypokalemia.      AKI,  improving.     Severe malnutrition per RD.  Regular diet, numerous vitamin/mineral supplements, ensure in place.       Anxiety.  Seen once last week by psychiatry.  Their diagnosis is not clear the only thing that is mentioned in the note is anxiety.   PLAN     Going to drop dose Protonix to 40 mg po daily (no PPI PTA).  Given the relative benign findings on EGD doses of 160 mg Protonix daily seems excessive.  Finish 2 weeks course of Diflucan.   Azucena Freed  05/11/2022, 1:24 PM Phone [phone]    Attending Physician Note   I have taken an interval history, reviewed the chart and examined the patient. I performed a substantive portion of this encounter, including complete performance of at least one of the key components, in conjunction with the APP. I agree with the APP's note, impression and  recommendations with my edits. My additional impressions and recommendations are as follows.   *N/V, weight loss, anorexia, malnutrition. Suspected cause is behavioral health related and Psychiatry is following. Candida esophagitis could be contributing.   *Candida esophagitis. Complete course of Diflucan as planned.   *Elevated LFTs, etiology unclear, improving. Continue to trend.      *Reactive gastropathy and reactive duodenopathy. Pantoprazole 40 mg po qd.   *Adrenal insufficiency, per primary service.   No additional GI recommendations. GI signing off.   Lucio Edward, MD [facility] See [facility], [facility], for our on call provider

## 2022-05-11 NOTE — Progress Notes (Signed)
Nutrition Follow-up  DOCUMENTATION CODES:  Underweight, Severe malnutrition in context of chronic illness  INTERVENTION:  -Continue current diet as ordered - MVI with minerals daily - Cholecalciferol 1000 units daily for vitamin D deficiency - Folic acid 1 mg daily for folate deficiency - Vitamin A 10,000 IU x 10 days for deficiency - Vitamin C 500mg  daily x 30 days for deficiency - Discontinue Ensure Enlive per pt request, now consuming 100% of her meals and feeling much improved  - Awaiting results zinc, and copper labs  NUTRITION DIAGNOSIS:   Severe Malnutrition related to chronic illness (chronic N/V) as evidenced by severe muscle depletion, severe fat depletion.  Ongoing, being addressed via diet  GOAL:   Patient will meet greater than or equal to 90% of their needs  Progressing  MONITOR:   PO intake, Labs, Weight trends  REASON FOR ASSESSMENT:   Consult Assessment of nutrition requirement/status, Enteral/tube feeding initiation and management  ASSESSMENT:   41 year old female who presented to the ED on 10/06 with N/V x 4-8 weeks and severe weakness. PMH of IVDU now in remission, chronic bronchitis, seizure disorder, bipolar disorder, anxiety. Pt admitted with hypotension, renal failure, metabolic derangements.  10/06 - clear liquids 10/09 - diet advanced to regular, Cortrak tube placement attempted but unsuccessful 10/12 - EGD, Esophageal candidiasis 10/14 - HIDA scan (no acute findings), diet advanced to regular  Pt resting in bed at the time of assessment. States appetite is much improved, 100% of lunch tray consumed. Pt reports she has been declining the Ensures as she is eating better. Offered a change in supplements, pt declines at this time. Would prefer to consume her meals alone at this time.  Discussed low micronutrient lab results and supplementation. Encouraged pt to share with PCP and have levels rechecked after supplementation is complete.   Average  Meal Intake: 10/15: 100% intake x 2 recorded meals 10/10-10/14: 21% intake x 6 recorded meals  Nutritionally Relevant Medications: Scheduled Meds:  cholecalciferol  1,000 Units Oral Daily   Ensure Enlive  237 mL Oral BID BM   fluconazole  200 mg Oral Daily   folic acid  1 mg Oral Daily   mirtazapine  7.5 mg Oral QHS   multivitamin with minerals  1 tablet Oral Daily   pantoprazole  80 mg Oral BID   phosphorus  500 mg Oral BID   sodium bicarbonate  1,300 mg Oral BID   PRN Meds:diphenhydrAMINE, docusate sodium, ondansetron, polyethylene glycol  Labs Reviewed: K 2.8 BUN 23, creatinine 1.23 Mg 1.4  Vitamin/Mineral Profile: Vitamin B12: 584 (WNL) Folate B9: 2.3 (low) Vitamin A: 16.9 (low) Vitamin D: 21.36 (low) Vitamin C: .1 (low) Vitamin E: alpha - 18.1 (WNL), gamma - 2.0 (WNL) Copper: pending Zinc: pending  Diet Order:   Diet Order             Diet regular Room service appropriate? Yes; Fluid consistency: Thin  Diet effective now                   EDUCATION NEEDS:  Education needs have been addressed  Skin:  Skin Assessment: Reviewed RN Assessment  Last BM:  10/15 - type 1  Height:   Ht Readings from Last 1 Encounters:  05/01/22 5\' 6"  (1.676 m)    Weight:   Wt Readings from Last 1 Encounters:  05/09/22 60 kg    Ideal Body Weight:  59.1 kg  BMI:  Body mass index is 21.35 kg/m.  Estimated Nutritional Needs:  Kcal:  1450-1650 Protein:  65-75 grams Fluid:  1.4-1.6 L    Ranell Patrick, RD, LDN Clinical Dietitian RD pager # available in AM[facility]  After hours/weekend pager # available in [facility]

## 2022-05-11 NOTE — Progress Notes (Signed)
Physical Therapy Treatment Patient Details Name: Jamie Shields MRN: [id number] DOB: Nov 21, 1980 Today's Date: 05/11/2022   History of Present Illness 41 year old chronically ill, malnourished female,  admitted to 10/6 w/ cc: Severe weakness, nausea and vomiting. Dx hypovolemic shock.    PT Comments    PT spoke with pt initially and pt refusing therapy due to pain, however RN came in room with pain medication and recommended therapy after pain medication administered. When PT returns pt standing up in room. Initially pt ambulates with UE support on foot board of bed and min guard. As pt starts across open ground experiences LoB requiring mod A for steadying. For remainder of ambulation provided pt with contact guard assist, pt with additional LoB but able to self steady with use of handrails or walls in hallway. Pt refuses RW use as she does not have room for it in her camper. D/c plan remains appropriate. PT will continue to follow acutely.   Recommendations for follow up therapy are one component of a multi-disciplinary discharge planning process, led by the attending physician.  Recommendations may be updated based on patient status, additional functional criteria and insurance authorization.  Follow Up Recommendations  Outpatient PT     Assistance Recommended at Discharge Intermittent Supervision/Assistance  Patient can return home with the following A little help with walking and/or transfers;A little help with bathing/dressing/bathroom;Assistance with cooking/housework;Assist for transportation;Help with stairs or ramp for entrance   Equipment Recommendations  Rolling walker (2 wheels)       Precautions / Restrictions Precautions Precautions: None Restrictions Weight Bearing Restrictions: No     Mobility  Bed Mobility               General bed mobility comments: up standing in room on entry    Transfers                         Ambulation/Gait Ambulation/Gait assistance: Mod assist, Min assist, Min guard Gait Distance (Feet): 60 Feet Assistive device: None Gait Pattern/deviations: Step-through pattern, Shuffle, Decreased stride length Gait velocity: slow Gait velocity interpretation: <1.31 ft/sec, indicative of household ambulator   General Gait Details: min guard for ambulation around foot of bed with pt reaching out for foot board, with ambulation away from bed, pt experienced LoB requiring modA for steadying, continued ambulation in hallway with pt use of handrail and walls, pt reports needing to be able to walk with out RW because she has no room for it. pt with 3-4 additional LoB which she self steadied with UE support         Balance Overall balance assessment: Needs assistance Sitting-balance support: No upper extremity supported, Feet supported Sitting balance-Leahy Scale: Good     Standing balance support: No upper extremity supported, During functional activity Standing balance-Leahy Scale: Good Standing balance comment: standign at sink for grooming with no UE support or LOB                            Cognition Arousal/Alertness: Awake/alert Behavior During Therapy: WFL for tasks assessed/performed Overall Cognitive Status: Within Functional Limits for tasks assessed                                             General Comments General comments (skin integrity, edema, etc.): pt  requested time for pain medication to work before therapy, PT returned after ~30 min pt agreeable      Pertinent Vitals/Pain Pain Assessment Pain Assessment: Faces Faces Pain Scale: Hurts little more Pain Location: stomach and BLEs Pain Descriptors / Indicators: Tightness, Sore Pain Intervention(s): Limited activity within patient's tolerance, Monitored during session, Repositioned     PT Goals (current goals can now be found in the care plan section) Acute Rehab PT  Goals Patient Stated Goal: Get well, go home PT Goal Formulation: With patient Time For Goal Achievement: 05/14/22 Potential to Achieve Goals: Good Progress towards PT goals: Progressing toward goals    Frequency    Min 3X/week      PT Plan Current plan remains appropriate       AM-PAC PT "6 Clicks" Mobility   Outcome Measure  Help needed turning from your back to your side while in a flat bed without using bedrails?: None Help needed moving from lying on your back to sitting on the side of a flat bed without using bedrails?: None Help needed moving to and from a bed to a chair (including a wheelchair)?: A Little Help needed standing up from a chair using your arms (e.g., wheelchair or bedside chair)?: A Little Help needed to walk in hospital room?: A Little Help needed climbing 3-5 steps with a railing? : A Lot 6 Click Score: 19    End of Session Equipment Utilized During Treatment: Gait belt Activity Tolerance: Patient limited by pain Patient left: Other (comment) (sitting EoB, MD in room) Nurse Communication: Mobility status PT Visit Diagnosis: Unsteadiness on feet (R26.81);Other abnormalities of gait and mobility (R26.89);Muscle weakness (generalized) (M62.81);Difficulty in walking, not elsewhere classified (R26.2)     Time: 2878-6767 PT Time Calculation (min) (ACUTE ONLY): 35 min  Charges:  $Gait Training: 8-22 mins $Therapeutic Activity: 8-22 mins                     Nikolus Marczak B. Migdalia Dk PT, DPT Acute Rehabilitation Services Please use secure chat or  Call Office [phone]    [place] 05/11/2022, 4:01 PM

## 2022-05-11 NOTE — Progress Notes (Signed)

## 2022-05-11 NOTE — Progress Notes (Signed)
PROGRESS NOTE  Jamie Shields  DOB: 10/14/1980  PCP: Nolene Ebbs, MD URK:[id number]  DOA: 05/01/2022  LOS: 10 days  Hospital Day: 11  Brief narrative: Jamie Shields is a 41 y.o. female with PMH significant for history of IV drug abuse currently in remission, seizure disorder on Keppra, bipolar 1 disorder, chronic bronchitis. 10/6, patient presented to the ED with complaint of nausea, vomiting for 4 to 6 weeks and 20 pound weight loss In the ED, her blood pressure was in 80s, labs with lactic acid elevated 2.2, WBC count elevated to 14.1 With the concern of septic shock, patient was started on IV empiric antibiotics, IV fluid, required pressors Admitted to ICU 10/12, EGD showed esophageal candidiasis, started on a course of Diflucan. 10/13, transferred to [facility] service  Subjective: Patient was seen and examined this afternoon.  Walking on the hallway with physical therapy.  No new symptoms.  But she is bothered by persistently enlarged legs.  Tender to touch bilaterally.  Chart reviewed In the last 24 hours, afebrile, heart rate in 60s and 70s, blood pressure stable in 130s mostly, breathing on room air. Last set of labs from this morning showed hemoglobin low at 7.9, WBC count normal, potassium significantly low at 2.8, BUN/creatinine improving to 23/1.23, magnesium low at 1.4.  Assessment and plan: Hypovolemic shock Presented with nausea, vomiting, failure to thrive for 4 to 6 weeks Initially covered for possible septic shock.  No growth in cultures.  Completed a course of empiric antibiotics. Work-up later showed esophageal candidiasis.  Patient apparently was not able to hold any food or drink down because of odynophagia leading to hypovolemic shock. Adequately hydrated.  Currently on midodrine 2.5 mg 3 times daily for blood pressure support.  Continue to monitor hemodynamics. WBC count, lactic acid level normalized. There was also concern of possible adrenal insufficiency but  apparently cortisol level ordered cosyntropin stimulation test was not done.  Patient was empirically started on IV hydrocortisone.  Currently being tapered down. CT scan with unremarkable adrenal gland. Recent Labs  Lab 05/06/22 1528 05/06/22 2105 05/08/22 0421 05/09/22 0229 05/11/22 0219  WBC  --   --  10.2 9.9 10.5  LATICACIDVEN 2.7* 1.7  --   --   --    Esophageal candidiasis Presented with nausea, vomiting for 2 weeks with 20 pound weight loss  10/12 EGD showed esophageal candidiasis.  Currently on course of Diflucan. Odynophagia improving.  Oral intake improving. Currently on regular diet.  Bilateral lower extremity edema Patient has 1+ bilateral pedal edema and is also tender to touch. Albumin running low at 2.5. Obtain ultrasound duplex bilateral lower extremities to rule out DVT.  If negative, will start compression bandage as well as Lasix.  AKI Acute metabolic acidosis Presented with elevated creatinine to 2.23, serum bicarb as low as 11. With IV hydration, creatinine bicarb level improving.  Continue to monitor Continue bicarb supplement. Recent Labs    05/02/22 1627 05/03/22 0432 05/04/22 0455 05/05/22 0451 05/06/22 1022 05/07/22 0347 05/08/22 0421 05/09/22 0229 05/10/22 0402 05/11/22 0219  BUN 67* 53* 36* 36* 33* 27* 27* 26* 25* 23*  CREATININE 2.23* 2.00* 1.64* 1.60* 1.53* 1.43* 1.52* 1.64* 1.54* 1.23*  CO2 14* 15* 11* 12* 16* 18* 18* 20* 21* 23   Hypokalemia/hypomagnesemia/hypophosphatemia Replaced trend as below.  Labs from this morning showed low potassium at 2.8 and low magnesium at 1.4.  IV and oral replacement ordered.  Recheck tomorrow. Recent Labs  Lab 05/06/22 1528 05/07/22 0347 05/08/22  0421 05/09/22 0229 05/10/22 0402 05/11/22 0219  K  --  4.2 4.1 3.8 3.1* 2.8*  MG 1.7  --  2.0  --   --  1.4*  PHOS  --   --  1.5* 1.7* 2.2* 2.9   Elevated LFTs Likely due to shock liver.   Gradually improving.  INR normal.   10/14, HIDA scan did not  suggest cholecystitis. Recent Labs  Lab 05/07/22 0347 05/08/22 0421 05/09/22 0229 05/10/22 0402 05/11/22 0219  AST 178* 179* 194* 102* 81*  ALT 139* 169* 206* 157* 150*  ALKPHOS 180* 209* 225* 191* 184*  BILITOT 0.3 0.5 0.3 0.3 0.4  PROT 4.5* 4.9* 4.7* 4.5* 4.6*  ALBUMIN 2.5* 2.7* 2.6* 2.5* 2.5*  INR  --   --  1.1  --   --   PLT  --  287 277  --  279   Aortic insufficiency 10/8, echocardiogram read as severe aortic insufficiency. Seen by cardiology.  Current clinical picture does not correlate with suspicion of severe AI.Marland Kitchen  Recommended follow-up as an outpatient to repeat echo once acute issues are resolved.   History of seizure disorder Continue Keppra    Psychiatric disorders Seen by psychiatry: "Doubt dx of bipolar disease in chart (see note - no definite manic episodes, risk-taking behavior largely confined to substance use). Clear PTSD, many risk factors for BPD. Pt seems to benefit from therapeutic conversation/brief psychotherapy interventions" Start mirtazapine, stop buspirone Minimize benzodiazepine use given baseline anxiety Psych recommended to f/u with [facility] for outpatient therapy and medication management.     Goals of care   Code Status: DNR    Mobility: Encourage ambulation  Skin assessment:     Nutritional status:  Body mass index is 21.35 kg/m.  Nutrition Problem: Severe Malnutrition Etiology: chronic illness (chronic N/V) Signs/Symptoms: severe muscle depletion, severe fat depletion     Diet:  Diet Order             Diet regular Room service appropriate? Yes; Fluid consistency: Thin  Diet effective now                   DVT prophylaxis:  heparin injection 5,000 Units Start: 05/01/22 2200   Antimicrobials: None Fluid: None Consultants: None at this time Family Communication: None at bedside  Status is: Inpatient  Continue in-hospital care because: Continues to have bilateral lower extremity  edema Level of care: Med-Surg   Dispo: The patient is from: Home              Anticipated d/c is to: Home, pending clinical course              Patient currently is not medically stable to d/c.   Difficult to place patient No     Infusions:   sodium chloride Stopped (05/05/22 2130)   magnesium sulfate bolus IVPB 4 g (05/11/22 1408)    Scheduled Meds:  ascorbic acid  250 mg Oral BID   cholecalciferol  1,000 Units Oral Daily   feeding supplement  237 mL Oral BID BM   fluconazole  200 mg Oral Daily   folic acid  1 mg Oral Daily   heparin  5,000 Units Subcutaneous Q8H   hydrocortisone sod succinate (SOLU-CORTEF) inj  50 mg Intravenous Daily   levETIRAcetam  500 mg Oral BID   midodrine  2.5 mg Oral TID WC   mirtazapine  15 mg Oral QHS   multivitamin with minerals  1 tablet Oral  Daily   [START ON 05/12/2022] pantoprazole  40 mg Oral Daily   sodium bicarbonate  1,300 mg Oral BID   vitamin A  10,000 Units Oral Daily    PRN meds: acetaminophen, diphenhydrAMINE, docusate sodium, hydrOXYzine, lidocaine, melatonin, morphine injection, ondansetron, mouth rinse, polyethylene glycol   Antimicrobials: Anti-infectives (From admission, onward)    Start     Dose/Rate Route Frequency Ordered Stop   05/08/22 1000  fluconazole (DIFLUCAN) tablet 200 mg       See Hyperspace for full Linked Orders Report.   200 mg Oral Daily 05/07/22 0955 05/21/22 0959   05/07/22 1045  fluconazole (DIFLUCAN) tablet 400 mg       See Hyperspace for full Linked Orders Report.   400 mg Oral  Once 05/07/22 0955 05/07/22 1037   05/03/22 1600  ceFEPIme (MAXIPIME) 2 g in sodium chloride 0.9 % 100 mL IVPB  Status:  Discontinued        2 g 200 mL/hr over 30 Minutes Intravenous Every 24 hours 05/03/22 0822 05/04/22 0811   05/02/22 1800  vancomycin (VANCOREADY) IVPB 750 mg/150 mL        750 mg 150 mL/hr over 60 Minutes Intravenous  Once 05/02/22 1709 05/02/22 1821   05/02/22 1600  ceFEPIme (MAXIPIME) 1 g in sodium  chloride 0.9 % 100 mL IVPB  Status:  Discontinued        1 g 200 mL/hr over 30 Minutes Intravenous Every 24 hours 05/01/22 1851 05/03/22 0822   05/01/22 1851  vancomycin variable dose per unstable renal function (pharmacist dosing)  Status:  Discontinued         Does not apply See admin instructions 05/01/22 1851 05/03/22 0754   05/01/22 1615  ceFEPIme (MAXIPIME) 2 g in sodium chloride 0.9 % 100 mL IVPB        2 g 200 mL/hr over 30 Minutes Intravenous  Once 05/01/22 1609 05/01/22 1650   05/01/22 1615  vancomycin (VANCOCIN) IVPB 1000 mg/200 mL premix        1,000 mg 200 mL/hr over 60 Minutes Intravenous  Once 05/01/22 1609 05/01/22 1722       Objective: Vitals:   05/11/22 0505 05/11/22 0755  BP: (!) 150/85 133/77  Pulse: 62 71  Resp: 18 18  Temp: 98.6 F (37 C) 97.9 F (36.6 C)  SpO2: 100% 100%   No intake or output data in the 24 hours ending 05/11/22 1539  Filed Weights   05/06/22 0500 05/07/22 0359 05/09/22 0500  Weight: 53.1 kg 57.7 kg 60 kg   Weight change:  Body mass index is 21.35 kg/m.   Physical Exam: General exam: [place] Caucasian female.  Looks older for her age Skin: No rashes, lesions or ulcers. HEENT: Atraumatic, normocephalic, no obvious bleeding Lungs: Clear to auscultation bilaterally CVS: Regular rate and rhythm, no murmur GI/Abd soft, nontender, nondistended, bowel sound present CNS: Alert, awake, oriented x3 Psychiatry: Mood appropriate Extremities: 1+ bilateral pedal edema, tender to touch anywhere in both legs (not specific to calf)  Data Review: I have personally reviewed the laboratory data and studies available.  F/u labs ordered Unresulted Labs (From admission, onward)     Start     Ordered   05/12/22 0500  CBC with Differential/Platelet  Daily at 5am,   R     Question:  Specimen collection method  Answer:  Unit=Unit collect   05/11/22 1152   05/12/22 0500  Magnesium  Tomorrow morning,   R  Question:  Specimen collection method   Answer:  Unit=Unit collect   05/11/22 1152   05/12/22 0500  Phosphorus  Tomorrow morning,   R       Question:  Specimen collection method  Answer:  Unit=Unit collect   05/11/22 1152   05/10/22 0500  Comprehensive metabolic panel  Daily at 5am,   R     Question:  Specimen collection method  Answer:  Unit=Unit collect   05/09/22 1814   05/05/22 0500  Zinc  Tomorrow morning,   R       Question:  Specimen collection method  Answer:  Unit=Unit collect   05/04/22 1403   05/05/22 0500  Copper, serum  Tomorrow morning,   R       Question:  Specimen collection method  Answer:  Unit=Unit collect   05/04/22 1403            Signed, Terrilee Croak, MD [facility] 05/11/2022

## 2022-05-11 NOTE — Consult Note (Signed)
[facility] Psychiatry New Face-to-Face Psychiatric Evaluation   Service Date: May 11, 2022 LOS:  LOS: 10 days    Assessment  Jamie Shields is a 41 y.o. female admitted medically for 05/01/2022  3:58 PM for hypovolemic shock secondary to protein calorie malnutrition and likely adrenal insufficiency. She carries the psychiatric diagnoses of depression, alcohol use disorder, anxiety, PTSD and has a past medical history of  epilepsy.Psychiatry was consulted for management of anxiety by [name], NP.   Continues to be anxious given still hospitalized and BLE edema and paresthesia is primary concern. Looks more alert and responsive compared to the three times she was seen by psych CL service last week. Frustrated by continued hospitalization but understands the need for it. Increasing remeron to better manage sleep and anxiety.  Diagnoses:  Active Hospital problems: Principal Problem:   Hypovolemic shock (HCC) Active Problems:   Severe dehydration   Nausea and vomiting   Disorders of fluid, electrolyte, and acid-base balance   Hyponatremia   AKI (acute kidney injury) (HCC)   Severe protein-calorie malnutrition (HCC)   Seizure disorder (HCC)   Bipolar disease, chronic (HCC)   Normal anion gap metabolic acidosis   DNR (do not resuscitate)   Adrenal insufficiency (HCC)   History of seizures   Loss of weight   LFTs abnormal     Plan  ## Safety and Observation Level:  - Based on my clinical evaluation, I estimate the patient to be at minimal risk of self harm in the current setting  ##PTSD ##GAD --Increasing Remeron to 15 mg qhs due to anxiety  -BP 133/77 (on midodrine tid) --Continue hydroxyzine 10 mg tid prn for anxiety --Arranged OP psychiatry and therapy for 11/2 with [facility]  --Encourage p.o. intake and mobilization as tolerated --Will consider adding trazodone if sleep continues to be a problem  ##Possible Adrenal insufficiency -on IV solu-cortef 50 mg  daily -Midodrine 2.5 mg tid with meals  ## Medical Decision Making Capacity:  Not formally assessed  ## Further Work-up:  -- per primary team -- most recent EKG on 05/06/22 had QtC of 478 -- Pertinent labwork reviewed earlier this admission includes: Cr 1.6>1.53, Folate 2.3, vitamin d 21.36, negative hCG, phos 1.9, mag 2.6, TSH 1.258  ## Disposition:  -- home once medically stable  ##Legal Status Patient is her own guardian  Thank you for this consult request. Recommendations have been communicated to the primary team.  We will continue to follow at this time.   [name], MD   History  Relevant Aspects of Hospital Course:  Admitted on 05/01/2022 for protein-calorie malnutrition, hypotension.  Patient Report:  Reports feeling very anxious today.  Reports this is primarily due to being still in the hospital.  Does report that seeing her pet dog on the phone cheered her up some. Denies SI/HI/AH.  Reports seeing "white tracers" and briefly shadowy figure over the weekend. Reports shadowy figure was when room was very dark and states it only happened once. Does report white tracers happen more in the daytime but she does admit to keeping her blinds closed and the lights off during the daytime. Reports poor sleep but fair appetite despite her dislike of hospital food.  Reports feeling frustrated that she has bilateral lower extremity edema with associated paresthesia.  Understands need for continued hospitalization but still expresses frustration regarding this.  Encourage patient to sit up in chair and have blinds up to maintain sleep-wake cycle.  Patient verbalized understanding but states that  she has some photosensitivity at this time. Agrees with increase in remeron to better assist with anxiety and depression.    Collateral information:  N/a  Psychiatric History:  Information collected from patient PTSD, GAD, MDD Has not had therapy in the past. Denies psychiatric  hospitalization.   Social History:  Tobacco use: denies Alcohol use: denies Drug use: denies  Family History:  The patient's family history includes Cancer in an other family member; Diabetes in an other family member; Hypertension in her mother.  Medical History: Past Medical History:  Diagnosis Date   Anxiety    Bipolar 1 disorder (HCC)    Bursitis of hip    Chronic bronchitis    Hip joint pain    Seizures (HCC)     Surgical History: Past Surgical History:  Procedure Laterality Date   CESAREAN SECTION     2002   KNEE ARTHROSCOPY     MOUTH SURGERY  2010    Medications:   Current Facility-Administered Medications:    0.9 %  sodium chloride infusion, 250 mL, Intravenous, Continuous, Coralyn Helling, MD, Stopped at 05/05/22 2130   acetaminophen (TYLENOL) tablet 650 mg, 650 mg, Oral, Q6H PRN, Coralyn Helling, MD, 650 mg at 05/10/22 4696   ascorbic acid (VITAMIN C) tablet 250 mg, 250 mg, Oral, BID, Dahal, Binaya, MD, 250 mg at 05/11/22 1207   cholecalciferol (VITAMIN D3) 25 MCG (1000 UNIT) tablet 1,000 Units, 1,000 Units, Oral, Daily, Coralyn Helling, MD, 1,000 Units at 05/11/22 0827   diphenhydrAMINE (BENADRYL) injection 25 mg, 25 mg, Intravenous, Q6H PRN, Craige Cotta, Vineet, MD   docusate sodium (COLACE) capsule 100 mg, 100 mg, Oral, BID PRN, Coralyn Helling, MD   feeding supplement (ENSURE ENLIVE / ENSURE PLUS) liquid 237 mL, 237 mL, Oral, BID BM, Sood, Vineet, MD, 237 mL at 05/10/22 1600   [COMPLETED] fluconazole (DIFLUCAN) tablet 400 mg, 400 mg, Oral, Once, 400 mg at 05/07/22 1037 **FOLLOWED BY** fluconazole (DIFLUCAN) tablet 200 mg, 200 mg, Oral, Daily, Sood, Vineet, MD, 200 mg at 05/11/22 0825   folic acid (FOLVITE) tablet 1 mg, 1 mg, Oral, Daily, Sood, Vineet, MD, 1 mg at 05/11/22 0828   heparin injection 5,000 Units, 5,000 Units, Subcutaneous, Q8H, Sood, Vineet, MD, 5,000 Units at 05/11/22 1402   hydrocortisone sodium succinate (SOLU-CORTEF) 100 MG injection 50 mg, 50 mg, Intravenous,  Daily, Albertine Grates, MD, 50 mg at 05/11/22 1157   hydrOXYzine (ATARAX) tablet 10 mg, 10 mg, Oral, TID PRN, [name], MD, 10 mg at 05/10/22 1054   levETIRAcetam (KEPPRA) tablet 500 mg, 500 mg, Oral, BID, Sood, Vineet, MD, 500 mg at 05/11/22 0827   lidocaine (XYLOCAINE) 2 % jelly 1 Application, 1 Application, Topical, Once PRN, Coralyn Helling, MD   magnesium sulfate IVPB 4 g 100 mL, 4 g, Intravenous, Once, Dahal, Binaya, MD, Last Rate: 50 mL/hr at 05/11/22 1408, 4 g at 05/11/22 1408   melatonin tablet 3 mg, 3 mg, Oral, QHS PRN, Coralyn Helling, MD, 3 mg at 05/10/22 2126   midodrine (PROAMATINE) tablet 2.5 mg, 2.5 mg, Oral, TID WC, Albertine Grates, MD, 2.5 mg at 05/11/22 1208   mirtazapine (REMERON) tablet 15 mg, 15 mg, Oral, QHS, [name], MD   morphine (PF) 2 MG/ML injection 2 mg, 2 mg, Intravenous, Q3H PRN, Coralyn Helling, MD, 2 mg at 05/11/22 0827   multivitamin with minerals tablet 1 tablet, 1 tablet, Oral, Daily, Coralyn Helling, MD, 1 tablet at 05/11/22 0828   ondansetron (ZOFRAN) tablet 4 mg, 4 mg, Oral, Q8H  PRN, Coralyn Helling, MD, 4 mg at 05/09/22 2013   Oral care mouth rinse, 15 mL, Mouth Rinse, PRN, Coralyn Helling, MD   [START ON 05/12/2022] pantoprazole (PROTONIX) EC tablet 40 mg, 40 mg, Oral, Daily, Gribbin, Sarah J, PA-C   polyethylene glycol (MIRALAX / GLYCOLAX) packet 17 g, 17 g, Oral, Daily PRN, Craige Cotta, Vineet, MD   sodium bicarbonate tablet 1,300 mg, 1,300 mg, Oral, BID, Coralyn Helling, MD, 1,300 mg at 05/11/22 0827   vitamin A capsule 10,000 Units, 10,000 Units, Oral, Daily, Dahal, Melina Schools, MD, 10,000 Units at 05/11/22 1401  Allergies: Allergies  Allergen Reactions   Cucumber Extract Anaphylaxis   Keflex [Cephalexin] Nausea And Vomiting   Macrobid [Nitrofurantoin Monohydrate Macrocrystals] Hives and Swelling   Naproxen Nausea And Vomiting   Penicillins Nausea And Vomiting    Heavy vomitting Did PCN reaction causing immediate rash, facial/tongue/throat swelling, SOB or lightheadedness with  hypotension: No swelling, but im not sure if i got a rash as i was red all over already Did PCN reaction causing severe rash involving mucus membranes or skin necrosis: No Did a PCN reaction that required hospitalization Yes went to the ED Has patient had a PCN reaction occurring within the last 10 years: No-childhood allergy If all of the above answers are "NO", then may proceed with   Tramadol Nausea And Vomiting   Watermelon Concentrate Diarrhea   Darvocet [Propoxyphene N-Acetaminophen] Nausea And Vomiting and Other (See Comments)    migraines   Flagyl [Metronidazole Hcl] Hives, Swelling and Rash   Ketorolac Nausea And Vomiting and Other (See Comments)    migraines   Ultram [Tramadol Hcl] Nausea And Vomiting and Other (See Comments)    migraines   Vicodin [Hydrocodone-Acetaminophen] Nausea And Vomiting and Other (See Comments)    migraines       Objective  Vital signs:  Temp:  [97.9 F (36.6 C)-99.6 F (37.6 C)] 97.9 F (36.6 C) (10/16 0755) Pulse Rate:  [62-71] 71 (10/16 0755) Resp:  [16-18] 18 (10/16 0755) BP: (133-158)/(75-91) 133/77 (10/16 0755) SpO2:  [100 %] 100 % (10/16 0755)  Psychiatric Specialty Exam:  Presentation  General Appearance: Appropriate for Environment; Casual Frail, cachectic caucasian female that appears older than stated age  Eye Contact:Fair  Speech:Clear and Coherent; Normal Rate  Speech Volume:Normal  Handedness:Right   Mood and Affect  Mood:Euthymic  Affect:Appropriate; Congruent   Thought Process  Thought Processes:Coherent; Goal Directed; Linear  Descriptions of Associations:Intact  Orientation:Full (Time, Place and Person)  Thought Content:Logical  History of Schizophrenia/Schizoaffective disorder:No data recorded Duration of Psychotic Symptoms:No data recorded Hallucinations:No data recorded   Ideas of Reference:None  Suicidal Thoughts:No data recorded   Homicidal Thoughts:No data recorded    Sensorium   Memory:Immediate Fair; Recent Fair; Remote Fair  Judgment:Impaired  Insight:Fair   Executive Functions  Concentration:Fair  Attention Span:Fair  Recall:Fair  Fund of Knowledge:Good  Language:Good   Psychomotor Activity  Psychomotor Activity:No data recorded    Assets  Assets:Communication Skills; Desire for Improvement; Physical Health   Sleep  Sleep:No data recorded     Physical Exam: Physical Exam Vitals and nursing note reviewed.  Constitutional:      Appearance: Normal appearance. She is normal weight.  HENT:     Head: Normocephalic and atraumatic.  Pulmonary:     Effort: Pulmonary effort is normal.  Neurological:     General: No focal deficit present.     Mental Status: She is oriented to person, place, and time.    Review of Systems  Respiratory:  Negative for shortness of breath.   Cardiovascular:  Negative for chest pain.  Gastrointestinal:  Negative for abdominal pain, constipation, diarrhea, heartburn, nausea and vomiting.  Neurological:  Negative for headaches.   Blood pressure 133/77, pulse 71, temperature 97.9 F (36.6 C), temperature source Oral, resp. rate 18, height 5\' 6"  (1.676 m), weight 60 kg, SpO2 100 %. Body mass index is 21.35 kg/m.

## 2022-05-11 NOTE — Progress Notes (Signed)
Occupational Therapy Treatment Patient Details Name: Jamie Shields MRN: [id number] DOB: 03/21/1981 Today's Date: 05/11/2022   History of present illness 41 year old chronically ill, malnourished female,  admitted to 10/6 w/ cc: Severe weakness, nausea and vomiting. Dx hypovolemic shock.   OT comments  Pt making steady progress towards OT goals this session. Pt continues to present with decreased activity tolerance and increased pain . Session focus on challenging functional mobility with no AD as pt reports RW will not fit through her home. Pt completed functional ambulation in room with no AD and supervision,no LOB noted. Pt completed 3/3 toileting tasks with supervision and grooming tasks with supervision with no LOB. Pt reports fatigue after household distance functional mobility in room, education provided on energy conservation strategies for home. Pt would continue to benefit from skilled occupational therapy while admitted to address the below listed limitations in order to improve overall functional mobility and facilitate independence with BADL participation. DC plan remains appropriate, will follow acutely per POC.      Recommendations for follow up therapy are one component of a multi-disciplinary discharge planning process, led by the attending physician.  Recommendations may be updated based on patient status, additional functional criteria and insurance authorization.    Follow Up Recommendations  No OT follow up    Assistance Recommended at Discharge Intermittent Supervision/Assistance  Patient can return home with the following  Assistance with cooking/housework;Assist for transportation;A little help with bathing/dressing/bathroom;A little help with walking and/or transfers   Equipment Recommendations  None recommended by OT;Other (comment) (Pt was recomended to have a RW but reported they already declined to use at this time even though this therapist educated about saftey  with mobility.)    Recommendations for Other Services      Precautions / Restrictions Precautions Precautions: None Restrictions Weight Bearing Restrictions: No       Mobility Bed Mobility   Bed Mobility: Supine to Sit, Sit to Supine     Supine to sit: Modified independent (Device/Increase time) Sit to supine: Modified independent (Device/Increase time)   General bed mobility comments: use of bed features    Transfers Overall transfer level: Needs assistance Equipment used: Rolling walker (2 wheels) Transfers: Sit to/from Stand Sit to Stand: Supervision           General transfer comment: supervision for safety from EOB and toilet     Balance   Sitting-balance support: No upper extremity supported, Feet supported Sitting balance-Leahy Scale: Good     Standing balance support: No upper extremity supported, During functional activity Standing balance-Leahy Scale: Good Standing balance comment: standign at sink for grooming with no UE support or LOB                           ADL either performed or assessed with clinical judgement   ADL Overall ADL's : Needs assistance/impaired     Grooming: Wash/dry hands;Standing;Supervision/safety Grooming Details (indicate cue type and reason): standing at sink with no UE support or LOB     Lower Body Bathing: Supervison/ safety;Sit to/from stand Lower Body Bathing Details (indicate cue type and reason): simulated via pericare         Toilet Transfer: Supervision/safety;Ambulation Toilet Transfer Details (indicate cue type and reason): no AD, supervision for safety with no LOB Toileting- Clothing Manipulation and Hygiene: Supervision/safety;Sit to/from stand       Functional mobility during ADLs: Supervision/safety General ADL Comments: ADL participation impacted by decreased activity  tolerance and increased pain    Extremity/Trunk Assessment Upper Extremity Assessment Upper Extremity Assessment:  Generalized weakness   Lower Extremity Assessment Lower Extremity Assessment: Defer to PT evaluation   Cervical / Trunk Assessment Cervical / Trunk Assessment: Normal    Vision Baseline Vision/History: 0 No visual deficits     Perception Perception Perception: Within Functional Limits   Praxis Praxis Praxis: Intact    Cognition Arousal/Alertness: Awake/alert Behavior During Therapy: WFL for tasks assessed/performed Overall Cognitive Status: Within Functional Limits for tasks assessed                                          Exercises      Shoulder Instructions       General Comments education provided on energy conservation strategies for home, pt reports living near another couple that checks on her while her husband is at work    Pertinent Vitals/ Pain       Pain Assessment Pain Assessment: Faces Faces Pain Scale: Hurts little more Pain Location: BLEs Pain Descriptors / Indicators: Tightness, Sore Pain Intervention(s): Monitored during session  Home Living                                          Prior Functioning/Environment              Frequency  Min 2X/week        Progress Toward Goals  OT Goals(current goals can now be found in the care plan section)  Progress towards OT goals: Progressing toward goals  Acute Rehab OT Goals Patient Stated Goal: to go home OT Goal Formulation: With patient Time For Goal Achievement: 05/22/22 Potential to Achieve Goals: Good  Plan Discharge plan remains appropriate;Frequency remains appropriate    Co-evaluation                 AM-PAC OT "6 Clicks" Daily Activity     Outcome Measure   Help from another person eating meals?: None Help from another person taking care of personal grooming?: None Help from another person toileting, which includes using toliet, bedpan, or urinal?: None Help from another person bathing (including washing, rinsing, drying)?: A  Little Help from another person to put on and taking off regular upper body clothing?: None Help from another person to put on and taking off regular lower body clothing?: A Little 6 Click Score: 22    End of Session    OT Visit Diagnosis: Muscle weakness (generalized) (M62.81);Other abnormalities of gait and mobility (R26.89);Unsteadiness on feet (R26.81);Pain Pain - Right/Left:  (bilateral) Pain - part of body: Leg   Activity Tolerance Patient tolerated treatment well   Patient Left in bed;with call bell/phone within reach   Nurse Communication          Time: 0813-0822 OT Time Calculation (min): 9 min  Charges: OT General Charges $OT Visit: 1 Visit OT Treatments $Self Care/Home Management : 8-22 mins  Lenor Derrick., COTA/L Acute Rehabilitation Services [phone]   Barron Schmid 05/11/2022, 8:44 AM

## 2022-05-12 ENCOUNTER — Inpatient Hospital Stay (HOSPITAL_COMMUNITY): Payer: Medicaid Other

## 2022-05-12 ENCOUNTER — Encounter (HOSPITAL_COMMUNITY): Payer: Self-pay | Admitting: Gastroenterology

## 2022-05-12 DIAGNOSIS — M7989 Other specified soft tissue disorders: Secondary | ICD-10-CM

## 2022-05-12 DIAGNOSIS — R609 Edema, unspecified: Secondary | ICD-10-CM

## 2022-05-12 LAB — COMPREHENSIVE METABOLIC PANEL
ALT: 122 U/L — ABNORMAL HIGH (ref 0–44)
AST: 60 U/L — ABNORMAL HIGH (ref 15–41)
Albumin: 2.6 g/dL — ABNORMAL LOW (ref 3.5–5.0)
Alkaline Phosphatase: 203 U/L — ABNORMAL HIGH (ref 38–126)
Anion gap: 7 (ref 5–15)
BUN: 20 mg/dL (ref 6–20)
CO2: 27 mmol/L (ref 22–32)
Calcium: 8.1 mg/dL — ABNORMAL LOW (ref 8.9–10.3)
Chloride: 110 mmol/L (ref 98–111)
Creatinine, Ser: 1.05 mg/dL — ABNORMAL HIGH (ref 0.44–1.00)
GFR, Estimated: >60 mL/min (ref >60)
Glucose, Bld: 93 mg/dL (ref 70–99)
Potassium: 3.3 mmol/L — ABNORMAL LOW (ref 3.5–5.1)
Sodium: 144 mmol/L (ref 135–145)
Total Bilirubin: 0.2 mg/dL — ABNORMAL LOW (ref 0.3–1.2)
Total Protein: 4.8 g/dL — ABNORMAL LOW (ref 6.5–8.1)

## 2022-05-12 LAB — PHOSPHORUS: Phosphorus: 3.1 mg/dL (ref 2.5–4.6)

## 2022-05-12 LAB — CBC WITH DIFFERENTIAL/PLATELET
Abs Immature Granulocytes: 0.04 K/uL (ref 0.00–0.07)
Basophils Absolute: 0 K/uL (ref 0.0–0.1)
Basophils Relative: 0 %
Eosinophils Absolute: 0.1 K/uL (ref 0.0–0.5)
Eosinophils Relative: 1 %
HCT: 24.6 % — ABNORMAL LOW (ref 36.0–46.0)
Hemoglobin: 8.2 g/dL — ABNORMAL LOW (ref 12.0–15.0)
Immature Granulocytes: 0 %
Lymphocytes Relative: 15 %
Lymphs Abs: 1.3 K/uL (ref 0.7–4.0)
MCH: 33.6 pg (ref 26.0–34.0)
MCHC: 33.3 g/dL (ref 30.0–36.0)
MCV: 100.8 fL — ABNORMAL HIGH (ref 80.0–100.0)
Monocytes Absolute: 0.5 K/uL (ref 0.1–1.0)
Monocytes Relative: 6 %
Neutro Abs: 7 K/uL (ref 1.7–7.7)
Neutrophils Relative %: 78 %
Platelets: 286 K/uL (ref 150–400)
RBC: 2.44 MIL/uL — ABNORMAL LOW (ref 3.87–5.11)
RDW: 19.1 % — ABNORMAL HIGH (ref 11.5–15.5)
WBC: 9 K/uL (ref 4.0–10.5)
nRBC: 0 % (ref 0.0–0.2)

## 2022-05-12 LAB — COPPER, SERUM: Copper: 88 ug/dL (ref 80–158)

## 2022-05-12 LAB — MAGNESIUM: Magnesium: 1.9 mg/dL (ref 1.7–2.4)

## 2022-05-12 LAB — ZINC: Zinc: 63 ug/dL (ref 44–115)

## 2022-05-12 MED ORDER — POTASSIUM CHLORIDE CRYS ER 20 MEQ PO TBCR
40.0000 meq | EXTENDED_RELEASE_TABLET | Freq: Once | ORAL | Status: AC
  Administered 2022-05-12: 40 meq via ORAL
  Filled 2022-05-12: qty 2

## 2022-05-12 MED ORDER — FUROSEMIDE 10 MG/ML IJ SOLN
20.0000 mg | Freq: Once | INTRAMUSCULAR | Status: AC
  Administered 2022-05-12: 20 mg via INTRAVENOUS
  Filled 2022-05-12: qty 2

## 2022-05-12 NOTE — Progress Notes (Signed)
Lower extremity venous has been completed.   Preliminary results in CV Proc.   Jamie Shields 05/12/2022 3:06 PM

## 2022-05-12 NOTE — Progress Notes (Signed)
PROGRESS NOTE  Jamie Shields  DOB: July 01, 1981  PCP: Fleet Contras, MD KWI:[id number]  DOA: 05/01/2022  LOS: 11 days  Hospital Day: 12  Brief narrative: Jamie Shields is a 41 y.o. female with PMH significant for history of IV drug abuse currently in remission, seizure disorder on Keppra, bipolar 1 disorder, chronic bronchitis. 10/6, patient presented to the ED with complaint of nausea, vomiting for 4 to 6 weeks and 20 pound weight loss In the ED, her blood pressure was in 80s, labs with lactic acid elevated 2.2, WBC count elevated to 14.1 With the concern of septic shock, patient was started on IV empiric antibiotics, IV fluid, required pressors Admitted to ICU 10/12, EGD showed esophageal candidiasis, started on a course of Diflucan. 10/13, transferred to [facility] service  Subjective: Patient was seen and examined this morning.  Sitting up in bed not in distress no new symptoms.  Continues to have bilateral lower extremity edema.  Pending ultrasound duplex. Remains hemodynamically stable, afebrile, breathing on room air Labs from this morning with hemoglobin 8.2 potassium low at 3.3, creatinine improving, 1.5  Assessment and plan: Hypovolemic shock Presented with nausea, vomiting, failure to thrive for 4 to 6 weeks Initially covered for possible septic shock.  No growth in cultures.  Completed a course of empiric antibiotics. Work-up later showed esophageal candidiasis.  Patient apparently was not able to hold any food or drink down because of odynophagia leading to hypovolemic shock. Adequately hydrated.  Currently on midodrine 2.5 mg 3 times daily for blood pressure support.  Continue to monitor hemodynamics. WBC count, lactic acid level normalized. There was also concern of possible adrenal insufficiency but apparently cortisol level ordered cosyntropin stimulation test was not done.  Patient was empirically started on IV hydrocortisone.  Currently being tapered down. CT scan with  unremarkable adrenal gland. Recent Labs  Lab 05/06/22 1528 05/06/22 2105 05/08/22 0421 05/09/22 0229 05/11/22 0219 05/12/22 0223  WBC  --   --  10.2 9.9 10.5 9.0  LATICACIDVEN 2.7* 1.7  --   --   --   --    Esophageal candidiasis Presented with nausea, vomiting for 2 weeks with 20 pound weight loss  10/12 EGD showed esophageal candidiasis.  Currently on course of Diflucan. Odynophagia improving.  Oral intake improving. Currently on regular diet.  Continue PPI  Bilateral lower extremity edema Patient has 1+ bilateral pedal edema and is also tender to touch. Albumin running low at 2.5. Pending ultrasound duplex bilateral lower extremities to rule out DVT.  If negative, will start compression bandage.  Give 1 dose of Lasix IV today.  AKI Acute metabolic acidosis Presented with elevated creatinine to 2.23, serum bicarb as low as 11. With IV hydration, creatinine bicarb level improving.  Continue to monitor Continue bicarb supplement. Recent Labs    05/03/22 0432 05/04/22 0455 05/05/22 0451 05/06/22 1022 05/07/22 0347 05/08/22 0421 05/09/22 0229 05/10/22 0402 05/11/22 0219 05/12/22 0223  BUN 53* 36* 36* 33* 27* 27* 26* 25* 23* 20  CREATININE 2.00* 1.64* 1.60* 1.53* 1.43* 1.52* 1.64* 1.54* 1.23* 1.05*  CO2 15* 11* 12* 16* 18* 18* 20* 21* 23 27   Hypokalemia/hypomagnesemia/hypophosphatemia Replaced trend as below.  Labs from this morning showed low potassium at 3.3.  Replacement given. Recent Labs  Lab 05/06/22 1528 05/07/22 0347 05/08/22 0421 05/09/22 0229 05/10/22 0402 05/11/22 0219 05/12/22 0223  K  --    < > 4.1 3.8 3.1* 2.8* 3.3*  MG 1.7  --  2.0  --   --  1.4* 1.9  PHOS  --   --  1.5* 1.7* 2.2* 2.9 3.1   < > = values in this interval not displayed.   Elevated LFTs Likely due to shock liver.   Gradually improving.  INR normal.   10/14, HIDA scan did not suggest cholecystitis. Alpha-1 antitrypsin level low at 96 (range 100-188), serum IgG low, serum  ceruloplasmin level low.  Discussed with GI Dr.Stark who responded as ' The A1AT and ceruloplasmin are borderline low which could be nutrition related however they might be clinically significant. Outpatient follow up with Dr. Havery Moros and repeat A1AT, ceruloplasmin in several weeks is reasonable. An elevated IgG would be a supporting test for autoimmune hepatitis so a low level argues against that diagnosis. No additional inpatient LFT evaluation is needed. ' Follow-up with GI as an outpatient  Recent Labs  Lab 05/08/22 0421 05/09/22 0229 05/10/22 0402 05/11/22 0219 05/12/22 0223  AST 179* 194* 102* 81* 60*  ALT 169* 206* 157* 150* 122*  ALKPHOS 209* 225* 191* 184* 203*  BILITOT 0.5 0.3 0.3 0.4 0.2*  PROT 4.9* 4.7* 4.5* 4.6* 4.8*  ALBUMIN 2.7* 2.6* 2.5* 2.5* 2.6*  INR  --  1.1  --   --   --   PLT 287 277  --  279 286   Aortic insufficiency 10/8, echocardiogram read as severe aortic insufficiency. Seen by cardiology.  Current clinical picture does not correlate with suspicion of severe AI.Marland Kitchen  Recommended follow-up as an outpatient to repeat echo once acute issues are resolved.   History of seizure disorder Continue Keppra   Severe malnutrition As evidenced by chronic nausea, vomiting, severe muscle depression, severe fat depletion.  Dietitian consult appreciated. Continue dietary intervention, vitamin supplementation.  Psychiatric disorders Seen by psychiatry: "Doubt dx of bipolar disease in chart (see note - no definite manic episodes, risk-taking behavior largely confined to substance use). Clear PTSD, many risk factors for BPD. Pt seems to benefit from therapeutic conversation/brief psychotherapy interventions" Start mirtazapine, stop buspirone Minimize benzodiazepine use given baseline anxiety Psych recommended to f/u with [facility] for outpatient therapy and medication management.     Goals of care   Code Status: DNR    Mobility:  Encourage ambulation  Skin assessment:     Nutritional status:  Body mass index is 21.17 kg/m.  Nutrition Problem: Severe Malnutrition Etiology: chronic illness (chronic N/V) Signs/Symptoms: severe muscle depletion, severe fat depletion     Diet:  Diet Order             Diet regular Room service appropriate? Yes; Fluid consistency: Thin  Diet effective now                   DVT prophylaxis:  heparin injection 5,000 Units Start: 05/01/22 2200   Antimicrobials: None Fluid: None Consultants: None at this time Family Communication: None at bedside  Status is: Inpatient  Continue in-hospital care because: Continues to have bilateral lower extremity edema, pending ultrasound DVT scan Level of care: Med-Surg   Dispo: The patient is from: Home              Anticipated d/c is to: Home, pending clinical course              Patient currently is not medically stable to d/c.   Difficult to place patient No     Infusions:   sodium chloride Stopped (05/05/22 2130)    Scheduled Meds:  ascorbic acid  250 mg Oral BID  Chlorhexidine Gluconate Cloth  6 each Topical Daily   cholecalciferol  1,000 Units Oral Daily   fluconazole  200 mg Oral Daily   folic acid  1 mg Oral Daily   furosemide  20 mg Intravenous Once   heparin  5,000 Units Subcutaneous Q8H   hydrocortisone sod succinate (SOLU-CORTEF) inj  50 mg Intravenous Daily   levETIRAcetam  500 mg Oral BID   midodrine  2.5 mg Oral TID WC   mirtazapine  15 mg Oral QHS   multivitamin with minerals  1 tablet Oral Daily   pantoprazole  40 mg Oral Daily   sodium bicarbonate  1,300 mg Oral BID   sodium chloride flush  10-40 mL Intracatheter Q12H   vitamin A  10,000 Units Oral Daily    PRN meds: acetaminophen, diphenhydrAMINE, docusate sodium, hydrOXYzine, lidocaine, melatonin, morphine injection, ondansetron, mouth rinse, polyethylene glycol, sodium chloride flush   Antimicrobials: Anti-infectives (From admission,  onward)    Start     Dose/Rate Route Frequency Ordered Stop   05/08/22 1000  fluconazole (DIFLUCAN) tablet 200 mg       See Hyperspace for full Linked Orders Report.   200 mg Oral Daily 05/07/22 0955 05/21/22 0959   05/07/22 1045  fluconazole (DIFLUCAN) tablet 400 mg       See Hyperspace for full Linked Orders Report.   400 mg Oral  Once 05/07/22 0955 05/07/22 1037   05/03/22 1600  ceFEPIme (MAXIPIME) 2 g in sodium chloride 0.9 % 100 mL IVPB  Status:  Discontinued        2 g 200 mL/hr over 30 Minutes Intravenous Every 24 hours 05/03/22 0822 05/04/22 0811   05/02/22 1800  vancomycin (VANCOREADY) IVPB 750 mg/150 mL        750 mg 150 mL/hr over 60 Minutes Intravenous  Once 05/02/22 1709 05/02/22 1821   05/02/22 1600  ceFEPIme (MAXIPIME) 1 g in sodium chloride 0.9 % 100 mL IVPB  Status:  Discontinued        1 g 200 mL/hr over 30 Minutes Intravenous Every 24 hours 05/01/22 1851 05/03/22 0822   05/01/22 1851  vancomycin variable dose per unstable renal function (pharmacist dosing)  Status:  Discontinued         Does not apply See admin instructions 05/01/22 1851 05/03/22 0754   05/01/22 1615  ceFEPIme (MAXIPIME) 2 g in sodium chloride 0.9 % 100 mL IVPB        2 g 200 mL/hr over 30 Minutes Intravenous  Once 05/01/22 1609 05/01/22 1650   05/01/22 1615  vancomycin (VANCOCIN) IVPB 1000 mg/200 mL premix        1,000 mg 200 mL/hr over 60 Minutes Intravenous  Once 05/01/22 1609 05/01/22 1722       Objective: Vitals:   05/12/22 0851 05/12/22 0851  BP: 126/82   Pulse: 73 72  Resp: (!) 1   Temp: 99 F (37.2 C)   SpO2: 99% 99%    Intake/Output Summary (Last 24 hours) at 05/12/2022 1434 Last data filed at 05/12/2022 0852 Gross per 24 hour  Intake 240 ml  Output --  Net 240 ml    Filed Weights   05/07/22 0359 05/09/22 0500 05/12/22 0501  Weight: 57.7 kg 60 kg 59.5 kg   Weight change:  Body mass index is 21.17 kg/m.   Physical Exam: General exam: Young Caucasian female.  Looks  older for her age Skin: No rashes, lesions or ulcers. HEENT: Atraumatic, normocephalic, no obvious bleeding Lungs: Clear to  patient bilaterally CVS: Regular rate and rhythm, no murmur GI/Abd soft, nontender, nondistended, bowel sound present CNS: Alert, awake, oriented x3 Psychiatry: Mood appropriate Extremities: Continues to have 1+ bilateral pedal edema, tender to touch anywhere in both legs (not specific to calf)  Data Review: I have personally reviewed the laboratory data and studies available.  F/u labs ordered Unresulted Labs (From admission, onward)     Start     Ordered   05/12/22 0500  CBC with Differential/Platelet  Daily at 5am,   R     Question:  Specimen collection method  Answer:  Unit=Unit collect   05/11/22 1152   05/05/22 0500  Zinc  Tomorrow morning,   R       Question:  Specimen collection method  Answer:  Unit=Unit collect   05/04/22 1403   05/05/22 0500  Copper, serum  Tomorrow morning,   R       Question:  Specimen collection method  Answer:  Unit=Unit collect   05/04/22 1403            Signed, Lorin Glass, MD [facility] 05/12/2022

## 2022-05-12 NOTE — Progress Notes (Signed)
Mobility Specialist - Progress Note   05/12/22 1129  Mobility  Activity Transferred from bed to chair  Level of Assistance Independent  Assistive Device None  Distance Ambulated (ft) 2 ft  Activity Response Tolerated well  $Mobility charge 1 Mobility    Pt received in bed requesting to sit in chair. Declined further mobility d/t pain and swelling in BLE. Left in chair w/ call bell in reach and all needs met.    Paulla Dolly Mobility Specialist

## 2022-05-13 LAB — HEPATITIS PANEL, ACUTE: Hep A IgM: NEGATIVE — AB

## 2022-05-13 LAB — CBC WITH DIFFERENTIAL/PLATELET
Abs Immature Granulocytes: 0.03 10*3/uL (ref 0.00–0.07)
Basophils Absolute: 0 10*3/uL (ref 0.0–0.1)
Basophils Relative: 0 %
Eosinophils Absolute: 0.1 10*3/uL (ref 0.0–0.5)
Eosinophils Relative: 1 %
HCT: 24.3 % — ABNORMAL LOW (ref 36.0–46.0)
Hemoglobin: 7.9 g/dL — ABNORMAL LOW (ref 12.0–15.0)
Immature Granulocytes: 0 %
Lymphocytes Relative: 24 %
Lymphs Abs: 2.2 10*3/uL (ref 0.7–4.0)
MCH: 33.6 pg (ref 26.0–34.0)
MCHC: 32.5 g/dL (ref 30.0–36.0)
MCV: 103.4 fL — ABNORMAL HIGH (ref 80.0–100.0)
Monocytes Absolute: 0.5 10*3/uL (ref 0.1–1.0)
Monocytes Relative: 5 %
Neutro Abs: 6.2 10*3/uL (ref 1.7–7.7)
Neutrophils Relative %: 70 %
Platelets: 291 10*3/uL (ref 150–400)
RBC: 2.35 MIL/uL — ABNORMAL LOW (ref 3.87–5.11)
RDW: 19.1 % — ABNORMAL HIGH (ref 11.5–15.5)
WBC: 9.1 10*3/uL (ref 4.0–10.5)
nRBC: 0 % (ref 0.0–0.2)

## 2022-05-13 MED ORDER — FUROSEMIDE 10 MG/ML IJ SOLN
40.0000 mg | Freq: Once | INTRAMUSCULAR | Status: AC
  Administered 2022-05-13: 40 mg via INTRAVENOUS
  Filled 2022-05-13: qty 4

## 2022-05-13 NOTE — Progress Notes (Signed)
OT Cancellation Note  Patient Details Name: Jamie Shields MRN: [id number] DOB: 10-04-1980   Cancelled Treatment:    Reason Eval/Treat Not Completed: Pain limiting ability to participate;Fatigue/lethargy limiting ability to participate. Pt declining any participation OOB stating her pain and fatigue are too high. OT will attempt to come back later in the day if schedule allows.    Laverle Hobby, OTR/L, CBIS Acute Rehab Office: [employer] 05/13/2022, 10:42 AM

## 2022-05-13 NOTE — Progress Notes (Addendum)
Physical Therapy Treatment Patient Details Name: Jamie Shields MRN: [id number] DOB: Nov 10, 1980 Today's Date: 05/13/2022   History of Present Illness 41 year old chronically ill, malnourished female,  admitted to 10/6 w/ cc: Severe weakness, nausea and vomiting. Dx hypovolemic shock.    PT Comments    Pt received in supine, agreeable to therapy session with encouragement, reporting LE pain/pressure slightly improved from previous session. Pt agreeable to trial cane for safety with hallway gait trial and pt much steadier with improved activity tolerance using cane, not needing to rely on furniture or railings for support when using cane. Pt still reporting moderate to severe abdominal, back and LE pain and RN notified and entering room to give meds at end of session. Reinforced LE exercises in bed/chair, edema relief technique and benefits of continued mobility throughout the day as able. Pt will benefit from continuing to work with mobility specialists and nursing staff on frequent ambulation in room/hallway multiple times daily. DME updated below as pt reports she is agreeable to use cane at home and would benefit for fall risk prevention, discussed with supervising PT Beth. Pt continues to benefit from PT services to progress toward functional mobility goals.    Recommendations for follow up therapy are one component of a multi-disciplinary discharge planning process, led by the attending physician.  Recommendations may be updated based on patient status, additional functional criteria and insurance authorization.  Follow Up Recommendations  Outpatient PT     Assistance Recommended at Discharge Intermittent Supervision/Assistance  Patient can return home with the following A little help with walking and/or transfers;A little help with bathing/dressing/bathroom;Assistance with cooking/housework;Assist for transportation;Help with stairs or ramp for entrance   Equipment Recommendations  Cane     Recommendations for Other Services       Precautions / Restrictions Precautions Precautions: Fall Restrictions Weight Bearing Restrictions: No     Mobility  Bed Mobility Overal bed mobility: Modified Independent Bed Mobility: Supine to Sit     Supine to sit: Modified independent (Device/Increase time)     General bed mobility comments: use of bed features, HOB elevated by pt prior to attempt; to L EOB    Transfers Overall transfer level: Needs assistance Equipment used: Straight cane Transfers: Sit to/from Stand Sit to Stand: Supervision           General transfer comment: supervision with cues for safe UE placement    Ambulation/Gait Ambulation/Gait assistance: Min guard, Supervision Gait Distance (Feet): 260 Feet Assistive device: Straight cane Gait Pattern/deviations: Step-through pattern, Shuffle, Decreased stride length, Wide base of support, Antalgic Gait velocity: slow     General Gait Details: increased postural sway with gait and pt c/o BLE heaviness but improved from previous session; using cane, pt reports slightly improved comfort/stability and agreeable to utilize upon return home; no overt LOB using cane, pt occasionally switching cane from one hand to another for comfort, denies having a "stronger" side to hold cane with and reports she is ambidexterous at home. trendelenburg-like pattern with c/o BLE heaviness   Stairs Stairs:  (pt defers today)           Wheelchair Mobility    Modified Rankin (Stroke Patients Only)       Balance Overall balance assessment: Needs assistance Sitting-balance support: No upper extremity supported, Feet supported Sitting balance-Leahy Scale: Good     Standing balance support: No upper extremity supported, During functional activity Standing balance-Leahy Scale: Good Standing balance comment: steady with use of cane for dynamic tasks,  increased postural sway but may be her baseline?                             Cognition Arousal/Alertness: Awake/alert Behavior During Therapy: WFL for tasks assessed/performed Overall Cognitive Status: Within Functional Limits for tasks assessed                                 General Comments: calm and cooperative, appreciative of therapist assistance        Exercises Other Exercises Other Exercises: verbal review for supine BLE exercises including hip abduction, heel slides, SLR, ankle pumps and seated LAQ as able, pt reports she has been performing some already in her room.    General Comments General comments (skin integrity, edema, etc.): pt c/o BLE tenderness to touch and not allowing PTA to check for pitting edema but agreeable to elevate BLE on pillow sitting in recliner at end of session      Pertinent Vitals/Pain Pain Assessment Pain Assessment: 0-10 Pain Score: 7  Pain Location: stomach and low back> BLEs Pain Descriptors / Indicators: Tightness, Sore Pain Intervention(s): Limited activity within patient's tolerance, Monitored during session, Repositioned, Patient requesting pain meds-RN notified     PT Goals (current goals can now be found in the care plan section) Acute Rehab PT Goals Patient Stated Goal: Get well, go home PT Goal Formulation: With patient Time For Goal Achievement: 05/21/22* Progress towards PT goals: Progressing toward goals    Frequency    Min 3X/week      PT Plan Equipment recommendations need to be updated       AM-PAC PT "6 Clicks" Mobility   Outcome Measure  Help needed turning from your back to your side while in a flat bed without using bedrails?: None Help needed moving from lying on your back to sitting on the side of a flat bed without using bedrails?: None Help needed moving to and from a bed to a chair (including a wheelchair)?: A Little Help needed standing up from a chair using your arms (e.g., wheelchair or bedside chair)?: A Little Help needed to walk in  hospital room?: A Little Help needed climbing 3-5 steps with a railing? : A Lot 6 Click Score: 19    End of Session Equipment Utilized During Treatment: Gait belt Activity Tolerance: Patient tolerated treatment well Patient left: Other (comment);in chair;with call bell/phone within reach;with nursing/sitter in room (RN giving her pain and anxiety meds) Nurse Communication: Mobility status;Patient requests pain meds PT Visit Diagnosis: Unsteadiness on feet (R26.81);Other abnormalities of gait and mobility (R26.89);Muscle weakness (generalized) (M62.81);Difficulty in walking, not elsewhere classified (R26.2)     Time: 1205-1219 PT Time Calculation (min) (ACUTE ONLY): 14 min  Charges:  $Gait Training: 8-22 mins                     Hazley Dezeeuw P., PTA Acute Rehabilitation Services Secure Chat Preferred 9a-5:30pm Office: [place] 05/13/2022, 1:06 PM *addendum: goal date updated to reflect 14 days per dept policy, PT in agreement after discussion

## 2022-05-13 NOTE — Consult Note (Signed)
[facility] New Face-to-Face Psychiatric Evaluation   Service Date: May 13, 2022 LOS:  LOS: 12 days    Assessment  Jamie Shields is a 41 y.o. female admitted medically for 05/01/2022  3:58 PM for hypovolemic shock secondary to protein calorie malnutrition and likely adrenal insufficiency. She carries the psychiatric diagnoses of depression, alcohol use disorder, anxiety, PTSD and has a past medical history of  epilepsy.Psychiatry was consulted for management of anxiety by Salvadore Dom, NP.   Anxiety mildly improved as discharging in next day or 2. BLE and paresthesia improving. Sleeping and eating continues to improve. Discussed some lab values that indicated malnutrition as patient was not clear on labs that had resulted.   Diagnoses:  Active Hospital problems: Principal Problem:   Hypovolemic shock (HCC) Active Problems:   Severe dehydration   Nausea and vomiting   Disorders of fluid, electrolyte, and acid-base balance   Hyponatremia   AKI (acute kidney injury) (HCC)   Severe protein-calorie malnutrition (HCC)   Seizure disorder (HCC)   Bipolar disease, chronic (HCC)   Normal anion gap metabolic acidosis   DNR (do not resuscitate)   Adrenal insufficiency (HCC)   History of seizures   Loss of weight   LFTs abnormal   Candida esophagitis ([place])     Plan  ## Safety and Observation Level:  - Based on my clinical evaluation, I estimate the patient to be at minimal risk of self harm in the current setting  ##PTSD ##GAD --Continue Remeron 15 mg qhs due to anxiety  -BP 133/77 (on midodrine tid) --Continue hydroxyzine 10 mg tid prn for anxiety --Arranged OP psychiatry and therapy for 11/2 with [facility]  --Encourage p.o. intake and mobilization as tolerated --Will consider adding trazodone if sleep continues to be a problem  ## Medical Decision Making Capacity:  Not formally assessed  ## Further Work-up:  -- per primary team -- most recent EKG on  05/06/22 had QtC of 478 -- -- Pertinent labwork reviewed earlier this admission includes: Cr 1.6>1.53, Folate 2.3, vitamin d 21.36, negative hCG, phos 1.9, mag 2.6, TSH 1.258  ## Disposition:  -- home once medically stable  ##Legal Status Patient is her own guardian  Thank you for this consult request. Recommendations have been communicated to the primary team.  We will sign off at this time.   France Ravens, MD   History  Relevant Aspects of Hospital Course:  Admitted on 05/01/2022 for protein-calorie malnutrition, hypotension.  Patient Report:  Reports improved mood. Denies SI/HI/AVH. Still anxious but excited that she will be going home in next day or two. Discussed importance of nutrition. Discussed physical activity which patient states she will try to do more of when she goes home as she has a dog she takes care of. No further questions or concerns at this time.  Psychiatric History:  Information collected from patient PTSD, GAD, MDD Has not had therapy in the past. Denies psychiatric hospitalization.   Social History:  Tobacco use: denies Alcohol use: denies Drug use: denies  Family History:  The patient's family history includes Cancer in an other family member; Diabetes in an other family member; Hypertension in her mother.  Medical History: Past Medical History:  Diagnosis Date   Anxiety    Bipolar 1 disorder ([place])    Bursitis of hip    Chronic bronchitis    Hip joint pain    Seizures ([place])     Surgical History: Past Surgical History:  Procedure Laterality Date  BIOPSY  05/07/2022   Procedure: BIOPSY;  Surgeon: Yetta Flock, MD;  Location: [facility] ENDOSCOPY;  Service: Gastroenterology;;   CESAREAN SECTION     2002   ESOPHAGOGASTRODUODENOSCOPY (EGD) WITH PROPOFOL N/A 05/07/2022   Procedure: ESOPHAGOGASTRODUODENOSCOPY (EGD) WITH PROPOFOL;  Surgeon: Yetta Flock, MD;  Location: [facility];  Service: Gastroenterology;  Laterality: N/A;   KNEE  ARTHROSCOPY     MOUTH SURGERY  2010    Medications:   Current Facility-Administered Medications:    0.9 %  sodium chloride infusion, 250 mL, Intravenous, Continuous, Chesley Mires, MD, Stopped at 05/05/22 2130   acetaminophen (TYLENOL) tablet 650 mg, 650 mg, Oral, Q6H PRN, Chesley Mires, MD, 650 mg at 05/10/22 [id number]   ascorbic acid (VITAMIN C) tablet 250 mg, 250 mg, Oral, BID, Dahal, Binaya, MD, 250 mg at 05/13/22 [id number]   Chlorhexidine Gluconate Cloth 2 % PADS 6 each, 6 each, Topical, Daily, Dahal, Binaya, MD, 6 each at 05/13/22 0835   cholecalciferol (VITAMIN D3) 25 MCG (1000 UNIT) tablet 1,000 Units, 1,000 Units, Oral, Daily, Chesley Mires, MD, 1,000 Units at 05/13/22 0831   diphenhydrAMINE (BENADRYL) injection 25 mg, 25 mg, Intravenous, Q6H PRN, Halford Chessman, Vineet, MD   docusate sodium (COLACE) capsule 100 mg, 100 mg, Oral, BID PRN, Chesley Mires, MD   [COMPLETED] fluconazole (DIFLUCAN) tablet 400 mg, 400 mg, Oral, Once, 400 mg at 05/07/22 1037 **FOLLOWED BY** fluconazole (DIFLUCAN) tablet 200 mg, 200 mg, Oral, Daily, Sood, Vineet, MD, 200 mg at [id number]   folic acid (FOLVITE) tablet 1 mg, 1 mg, Oral, Daily, Sood, Vineet, MD, 1 mg at 05/13/22 0831   heparin injection 5,000 Units, 5,000 Units, Subcutaneous, Q8H, Sood, Vineet, MD, 5,000 Units at 05/13/22 0512   hydrocortisone sodium succinate (SOLU-CORTEF) 100 MG injection 50 mg, 50 mg, Intravenous, Daily, Florencia Reasons, MD, 50 mg at 05/13/22 [id number]   hydrOXYzine (ATARAX) tablet 10 mg, 10 mg, Oral, TID PRN, France Ravens, MD, 10 mg at 05/13/22 [id number]   levETIRAcetam (KEPPRA) tablet 500 mg, 500 mg, Oral, BID, Halford Chessman, Vineet, MD, 500 mg at 05/13/22 0831   lidocaine (XYLOCAINE) 2 % jelly 1 Application, 1 Application, Topical, Once PRN, Chesley Mires, MD   melatonin tablet 3 mg, 3 mg, Oral, QHS PRN, Chesley Mires, MD, 3 mg at 05/10/22 2126   midodrine (PROAMATINE) tablet 2.5 mg, 2.5 mg, Oral, TID WC, Florencia Reasons, MD, 2.5 mg at 05/13/22 [id number]   mirtazapine (REMERON) tablet  15 mg, 15 mg, Oral, QHS, France Ravens, MD, 15 mg at 05/12/22 2130   morphine (PF) 2 MG/ML injection 2 mg, 2 mg, Intravenous, Q3H PRN, Chesley Mires, MD, 2 mg at 05/13/22 [id number]   multivitamin with minerals tablet 1 tablet, 1 tablet, Oral, Daily, Chesley Mires, MD, 1 tablet at 05/13/22 0831   ondansetron (ZOFRAN) tablet 4 mg, 4 mg, Oral, Q8H PRN, Chesley Mires, MD, 4 mg at 05/09/22 2013   Oral care mouth rinse, 15 mL, Mouth Rinse, PRN, Chesley Mires, MD   pantoprazole (PROTONIX) EC tablet 40 mg, 40 mg, Oral, Daily, Vena Rua, PA-C, 40 mg at 05/13/22 0831   polyethylene glycol (MIRALAX / GLYCOLAX) packet 17 g, 17 g, Oral, Daily PRN, Halford Chessman, Vineet, MD   sodium bicarbonate tablet 1,300 mg, 1,300 mg, Oral, BID, Sood, Vineet, MD, 1,300 mg at 05/13/22 0831   sodium chloride flush (NS) 0.9 % injection 10-40 mL, 10-40 mL, Intracatheter, Q12H, Dahal, Binaya, MD, 10 mL at 05/13/22 0835   sodium chloride flush (NS) 0.9 % injection 10-40 mL,  10-40 mL, Intracatheter, PRN, Terrilee Croak, MD, 10 mL at 05/12/22 0449   vitamin A capsule 10,000 Units, 10,000 Units, Oral, Daily, Dahal, Marlowe Aschoff, MD, 10,000 Units at 05/13/22 0831  Allergies: Allergies  Allergen Reactions   Cucumber Extract Anaphylaxis   Keflex [Cephalexin] Nausea And Vomiting   Macrobid [Nitrofurantoin Monohydrate Macrocrystals] Hives and Swelling   Naproxen Nausea And Vomiting   Penicillins Nausea And Vomiting    Heavy vomitting Did PCN reaction causing immediate rash, facial/tongue/throat swelling, SOB or lightheadedness with hypotension: No swelling, but im not sure if i got a rash as i was red all over already Did PCN reaction causing severe rash involving mucus membranes or skin necrosis: No Did a PCN reaction that required hospitalization Yes went to the ED Has patient had a PCN reaction occurring within the last 10 years: No-childhood allergy If all of the above answers are "NO", then may proceed with   Tramadol Nausea And Vomiting    Watermelon Concentrate Diarrhea   Darvocet [Propoxyphene N-Acetaminophen] Nausea And Vomiting and Other (See Comments)    migraines   Flagyl [Metronidazole Hcl] Hives, Swelling and Rash   Ketorolac Nausea And Vomiting and Other (See Comments)    migraines   Ultram [Tramadol Hcl] Nausea And Vomiting and Other (See Comments)    migraines   Vicodin [Hydrocodone-Acetaminophen] Nausea And Vomiting and Other (See Comments)    migraines       Objective  Vital signs:  Temp:  [98.1 F (36.7 C)-99 F (37.2 C)] 99 F (37.2 C) (10/18 0806) Pulse Rate:  [79-103] 103 (10/18 0806) Resp:  [16-17] 17 (10/18 0806) BP: (117-138)/(85-97) 124/86 (10/18 0806) SpO2:  [98 %-100 %] 98 % (10/18 0806) Weight:  [58.8 kg] 58.8 kg (10/18 0512)  Psychiatric Specialty Exam:  Presentation  General Appearance: Appropriate for Environment; Casual Frail, cachectic caucasian female that appears older than stated age  Eye Contact:Fair  Speech:Clear and Coherent; Normal Rate  Speech Volume:Normal  Handedness:Right   Mood and Affect  Mood:Euthymic  Affect:Appropriate; Congruent   Thought Process  Thought Processes:Coherent; Goal Directed; Linear  Descriptions of Associations:Intact  Orientation:Full (Time, Place and Person)  Thought Content:Logical  History of Schizophrenia/Schizoaffective disorder:No data recorded Duration of Psychotic Symptoms:No data recorded Hallucinations:No data recorded   Ideas of Reference:None  Suicidal Thoughts:No data recorded   Homicidal Thoughts:No data recorded    Sensorium  Memory:Immediate Fair; Recent Fair; Remote Fair  Judgment:Impaired  Insight:Fair   Executive Functions  Concentration:Fair  Attention Span:Fair  [place]  Language:Good   Psychomotor Activity  Psychomotor Activity:No data recorded    Assets  Assets:Communication Skills; Desire for Improvement; Physical Health   Sleep  Sleep:No  data recorded     Physical Exam: Physical Exam Vitals and nursing note reviewed.  Constitutional:      Appearance: Normal appearance. She is normal weight.  HENT:     Head: Normocephalic and atraumatic.  Pulmonary:     Effort: Pulmonary effort is normal.  Neurological:     General: No focal deficit present.     Mental Status: She is oriented to person, place, and time.   Review of Systems  Respiratory:  Negative for shortness of breath.   Cardiovascular:  Negative for chest pain.  Gastrointestinal:  Negative for abdominal pain, constipation, diarrhea, heartburn, nausea and vomiting.  Neurological:  Negative for headaches.   Blood pressure 124/86, pulse (!) 103, temperature 99 F (37.2 C), temperature source Oral, resp. rate 17, height 5\' 6"  (1.676 m),  weight 58.8 kg, SpO2 98 %. Body mass index is 20.92 kg/m.

## 2022-05-13 NOTE — Progress Notes (Signed)
PROGRESS NOTE  Jamie Shields  DOB: November 03, 1980  PCP: Fleet Contras, MD OZH:[id number]  DOA: 05/01/2022  LOS: 12 days  Hospital Day: 13  Brief narrative: Jamie Shields is a 41 y.o. female with PMH significant for history of IV drug abuse currently in remission, seizure disorder on Keppra, bipolar 1 disorder, chronic bronchitis. 10/6, patient presented to the ED with complaint of nausea, vomiting for 4 to 6 weeks and 20 pound weight loss In the ED, her blood pressure was in 80s, labs with lactic acid elevated 2.2, WBC count elevated to 14.1 With the concern of septic shock, patient was started on IV empiric antibiotics, IV fluid, required pressors Admitted to ICU 10/12, EGD showed esophageal candidiasis, started on a course of Diflucan. 10/13, transferred to [facility] service  Subjective: Patient was seen and examined this morning.   Sitting up in bed not in distress no new symptoms.   Continues to have bilateral lower extremity edema.   Assessment and plan: Hypovolemic shock Presented with nausea, vomiting, failure to thrive for 4 to 6 weeks Initially covered with antibiotics for possible septic shock.  No growth in cultures.  Completed a course of empiric antibiotics. Work-up later showed esophageal candidiasis.  Patient apparently was not able to hold any food or drink down because of odynophagia leading to hypovolemic shock. Adequately hydrated.  Currently on midodrine 2.5 mg 3 times daily for blood pressure support.  Continue to monitor hemodynamics. WBC count, lactic acid level normalized. There was also concern of possible adrenal insufficiency but apparently cortisol level ordered cosyntropin stimulation test was not done.  Patient was empirically started on IV hydrocortisone.  Currently being tapered down. CT scan with unremarkable adrenal gland. Recent Labs  Lab 05/06/22 1528 05/06/22 2105 05/08/22 0421 05/09/22 0229 05/11/22 0219 05/12/22 0223 05/13/22 0415  WBC  --   --   10.2 9.9 10.5 9.0 9.1  LATICACIDVEN 2.7* 1.7  --   --   --   --   --    Esophageal candidiasis Presented with nausea, vomiting for 2 weeks with 20 pound weight loss  10/12 EGD showed esophageal candidiasis.  Currently on course of Diflucan. Odynophagia improving.  Oral intake improving. Currently on regular diet.  Continue PPI  Bilateral lower extremity edema Patient has 1+ bilateral pedal edema and is also tender to touch. Albumin running low at 2.5. ultrasound duplex bilateral lower extremities negative for DVT.   1 dose of Lasix 20 mg IV was given yesterday.  Repeat Lasix today.  Encourage compression bandage. Tenderness could be due to peripheral neuropathy from folic acid deficiency. Expect improvement with folate replacement.  AKI Acute metabolic acidosis Presented with elevated creatinine to 2.23, serum bicarb as low as 11. Improved creatinine and bicarb level with IV hydration and sodium bicarb tablet.  We will stop sodium bicarb tablets today Recent Labs    05/03/22 0432 05/04/22 0455 05/05/22 0451 05/06/22 1022 05/07/22 0347 05/08/22 0421 05/09/22 0229 05/10/22 0402 05/11/22 0219 05/12/22 0223  BUN 53* 36* 36* 33* 27* 27* 26* 25* 23* 20  CREATININE 2.00* 1.64* 1.60* 1.53* 1.43* 1.52* 1.64* 1.54* 1.23* 1.05*  CO2 15* 11* 12* 16* 18* 18* 20* 21* 23 27   Hypokalemia/hypomagnesemia/hypophosphatemia Replaced trend as below.  Repeat labs tomorrow Recent Labs  Lab 05/06/22 1528 05/07/22 0347 05/08/22 0421 05/09/22 0229 05/10/22 0402 05/11/22 0219 05/12/22 0223  K  --    < > 4.1 3.8 3.1* 2.8* 3.3*  MG 1.7  --  2.0  --   --  1.4* 1.9  PHOS  --   --  1.5* 1.7* 2.2* 2.9 3.1   < > = values in this interval not displayed.   Elevated LFTs Likely due to shock liver.   Gradually improving.  INR normal.   10/14, HIDA scan did not suggest cholecystitis. Alpha-1 antitrypsin level low at 96 (range 100-188), serum IgG low, serum ceruloplasmin level low.  Discussed with  GI Dr.Stark who responded as ' The A1AT and ceruloplasmin are borderline low which could be nutrition related however they might be clinically significant. Outpatient follow up with Dr. Havery Moros and repeat A1AT, ceruloplasmin in several weeks is reasonable. An elevated IgG would be a supporting test for autoimmune hepatitis so a low level argues against that diagnosis. No additional inpatient LFT evaluation is needed. ' Follow-up with GI as an outpatient  Recent Labs  Lab 05/08/22 0421 05/09/22 0229 05/10/22 0402 05/11/22 0219 05/12/22 0223 05/13/22 0415  AST 179* 194* 102* 81* 60*  --   ALT 169* 206* 157* 150* 122*  --   ALKPHOS 209* 225* 191* 184* 203*  --   BILITOT 0.5 0.3 0.3 0.4 0.2*  --   PROT 4.9* 4.7* 4.5* 4.6* 4.8*  --   ALBUMIN 2.7* 2.6* 2.5* 2.5* 2.6*  --   INR  --  1.1  --   --   --   --   PLT 287 277  --  279 286 291   Chronic macrocytic anemia Folate deficiency No evidence of bleeding.  Ferritin level normal.  Folic acid level low at 2.3.  Folate deficiency could be the cause of macrocytic anemia.  Folate replacement ordered. Recent Labs    05/05/22 0451 05/05/22 0452 05/08/22 0421 05/09/22 0229 05/11/22 0219 05/12/22 0223 05/13/22 0415  HGB  --   --  8.3* 8.2* 7.9* 8.2* 7.9*  MCV  --   --  98.8 98.8 99.6 100.8* 103.4*  VITAMINB12 584  --   --   --   --   --   --   FOLATE  --  2.3*  --   --   --   --   --   FERRITIN  --   --   --  144  --   --   --   TIBC  --   --   --  266  --   --   --   IRON  --   --   --  35  --   --   --   RETICCTPCT  --   --   --  3.7*  --   --   --    Aortic insufficiency 10/8, echocardiogram read as severe aortic insufficiency. Seen by cardiology.  Current clinical picture does not correlate with suspicion of severe AI.Marland Kitchen  Recommended follow-up as an outpatient to repeat echo once acute issues are resolved.   History of seizure disorder Continue Keppra   Severe malnutrition As evidenced by chronic nausea, vomiting, severe muscle  depression, severe fat depletion.  Dietitian consult appreciated. Continue dietary intervention, vitamin supplementation.  Psychiatric disorders Seen by psychiatry: "Doubt dx of bipolar disease in chart (see note - no definite manic episodes, risk-taking behavior largely confined to substance use). Clear PTSD, many risk factors for BPD. Pt seems to benefit from therapeutic conversation/brief psychotherapy interventions" Start mirtazapine, stop buspirone Minimize benzodiazepine use given baseline anxiety Psych recommended to f/u with [facility] for outpatient therapy and medication management.  Goals of care   Code Status: DNR    Mobility: Encourage ambulation  Skin assessment:     Nutritional status:  Body mass index is 20.92 kg/m.  Nutrition Problem: Severe Malnutrition Etiology: chronic illness (chronic N/V) Signs/Symptoms: severe muscle depletion, severe fat depletion     Diet:  Diet Order             Diet regular Room service appropriate? Yes; Fluid consistency: Thin  Diet effective now                   DVT prophylaxis:  heparin injection 5,000 Units Start: 05/01/22 2200   Antimicrobials: None Fluid: None Consultants: None at this time Family Communication: None at bedside  Status is: Inpatient  Continue in-hospital care because: Continues to have bilateral lower extremity edema, more IV Lasix today.  Repeat labs tomorrow. Level of care: Med-Surg   Dispo: The patient is from: Home              Anticipated d/c is to: Home hopefully tomorrow.              Patient currently is not medically stable to d/c.   Difficult to place patient No     Infusions:   sodium chloride Stopped (05/05/22 2130)    Scheduled Meds:  ascorbic acid  250 mg Oral BID   Chlorhexidine Gluconate Cloth  6 each Topical Daily   cholecalciferol  1,000 Units Oral Daily   fluconazole  200 mg Oral Daily   folic acid  1 mg Oral Daily    furosemide  40 mg Intravenous Once   heparin  5,000 Units Subcutaneous Q8H   hydrocortisone sod succinate (SOLU-CORTEF) inj  50 mg Intravenous Daily   levETIRAcetam  500 mg Oral BID   midodrine  2.5 mg Oral TID WC   mirtazapine  15 mg Oral QHS   multivitamin with minerals  1 tablet Oral Daily   pantoprazole  40 mg Oral Daily   sodium chloride flush  10-40 mL Intracatheter Q12H   vitamin A  10,000 Units Oral Daily    PRN meds: acetaminophen, diphenhydrAMINE, docusate sodium, hydrOXYzine, lidocaine, melatonin, morphine injection, ondansetron, mouth rinse, polyethylene glycol, sodium chloride flush   Antimicrobials: Anti-infectives (From admission, onward)    Start     Dose/Rate Route Frequency Ordered Stop   05/08/22 1000  fluconazole (DIFLUCAN) tablet 200 mg       See Hyperspace for full Linked Orders Report.   200 mg Oral Daily 05/07/22 0955 05/21/22 0959   05/07/22 1045  fluconazole (DIFLUCAN) tablet 400 mg       See Hyperspace for full Linked Orders Report.   400 mg Oral  Once 05/07/22 0955 05/07/22 1037   05/03/22 1600  ceFEPIme (MAXIPIME) 2 g in sodium chloride 0.9 % 100 mL IVPB  Status:  Discontinued        2 g 200 mL/hr over 30 Minutes Intravenous Every 24 hours 05/03/22 0822 05/04/22 0811   05/02/22 1800  vancomycin (VANCOREADY) IVPB 750 mg/150 mL        750 mg 150 mL/hr over 60 Minutes Intravenous  Once 05/02/22 1709 05/02/22 1821   05/02/22 1600  ceFEPIme (MAXIPIME) 1 g in sodium chloride 0.9 % 100 mL IVPB  Status:  Discontinued        1 g 200 mL/hr over 30 Minutes Intravenous Every 24 hours 05/01/22 1851 05/03/22 0822   05/01/22 1851  vancomycin variable dose per unstable renal function (pharmacist dosing)  Status:  Discontinued         Does not apply See admin instructions 05/01/22 1851 05/03/22 0754   05/01/22 1615  ceFEPIme (MAXIPIME) 2 g in sodium chloride 0.9 % 100 mL IVPB        2 g 200 mL/hr over 30 Minutes Intravenous  Once 05/01/22 1609 05/01/22 1650    05/01/22 1615  vancomycin (VANCOCIN) IVPB 1000 mg/200 mL premix        1,000 mg 200 mL/hr over 60 Minutes Intravenous  Once 05/01/22 1609 05/01/22 1722       Objective: Vitals:   05/13/22 0542 05/13/22 0806  BP: 138/88 124/86  Pulse: 88 (!) 103  Resp:  17  Temp: 98.3 F (36.8 C) 99 F (37.2 C)  SpO2: 100% 98%    Intake/Output Summary (Last 24 hours) at 05/13/2022 1245 Last data filed at 05/13/2022 0808 Gross per 24 hour  Intake 240 ml  Output 225 ml  Net 15 ml    Filed Weights   05/09/22 0500 05/12/22 0501 05/13/22 0512  Weight: 60 kg 59.5 kg 58.8 kg   Weight change: -0.7 kg Body mass index is 20.92 kg/m.   Physical Exam: General exam: Young Caucasian female.  Not in physical distress Skin: No rashes, lesions or ulcers. HEENT: Atraumatic, normocephalic, no obvious bleeding Lungs: Clear to patient bilaterally CVS: Regular rate and rhythm, no murmur GI/Abd soft, nontender, nondistended, bowel sound present CNS: Alert, awake, oriented x3 Psychiatry: Mood appropriate Extremities: Continues to have 1+ bilateral pedal edema, tender to touch anywhere in both legs (not specific to calf)  Data Review: I have personally reviewed the laboratory data and studies available.  F/u labs ordered Unresulted Labs (From admission, onward)     Start     Ordered   05/14/22 0500  Comprehensive metabolic panel  Tomorrow morning,   R       Question:  Specimen collection method  Answer:  IV Team=IV Team collect   05/13/22 1237   05/12/22 0500  CBC with Differential/Platelet  Daily at 5am,   R     Question:  Specimen collection method  Answer:  Unit=Unit collect   05/11/22 1152            Signed, Lorin Glass, MD [facility] 05/13/2022

## 2022-05-14 LAB — COMPREHENSIVE METABOLIC PANEL
ALT: 108 U/L — ABNORMAL HIGH (ref 0–44)
AST: 60 U/L — ABNORMAL HIGH (ref 15–41)
Albumin: 2.4 g/dL — ABNORMAL LOW (ref 3.5–5.0)
Alkaline Phosphatase: 206 U/L — ABNORMAL HIGH (ref 38–126)
Anion gap: 9 (ref 5–15)
BUN: 18 mg/dL (ref 6–20)
CO2: 29 mmol/L (ref 22–32)
Calcium: 7.9 mg/dL — ABNORMAL LOW (ref 8.9–10.3)
Chloride: 104 mmol/L (ref 98–111)
Creatinine, Ser: 0.78 mg/dL (ref 0.44–1.00)
GFR, Estimated: >60 mL/min (ref >60)
Glucose, Bld: 95 mg/dL (ref 70–99)
Potassium: 3.5 mmol/L (ref 3.5–5.1)
Sodium: 142 mmol/L (ref 135–145)
Total Bilirubin: 0.4 mg/dL (ref 0.3–1.2)
Total Protein: 4.6 g/dL — ABNORMAL LOW (ref 6.5–8.1)

## 2022-05-14 LAB — CBC WITH DIFFERENTIAL/PLATELET
Abs Immature Granulocytes: 0.03 10*3/uL (ref 0.00–0.07)
Basophils Absolute: 0 10*3/uL (ref 0.0–0.1)
Basophils Relative: 0 %
Eosinophils Absolute: 0.1 10*3/uL (ref 0.0–0.5)
Eosinophils Relative: 2 %
HCT: 24.7 % — ABNORMAL LOW (ref 36.0–46.0)
Hemoglobin: 7.8 g/dL — ABNORMAL LOW (ref 12.0–15.0)
Immature Granulocytes: 0 %
Lymphocytes Relative: 30 %
Lymphs Abs: 2 10*3/uL (ref 0.7–4.0)
MCH: 32.9 pg (ref 26.0–34.0)
MCHC: 31.6 g/dL (ref 30.0–36.0)
MCV: 104.2 fL — ABNORMAL HIGH (ref 80.0–100.0)
Monocytes Absolute: 0.4 10*3/uL (ref 0.1–1.0)
Monocytes Relative: 6 %
Neutro Abs: 4.1 10*3/uL (ref 1.7–7.7)
Neutrophils Relative %: 62 %
Platelets: 290 10*3/uL (ref 150–400)
RBC: 2.37 MIL/uL — ABNORMAL LOW (ref 3.87–5.11)
RDW: 18.6 % — ABNORMAL HIGH (ref 11.5–15.5)
WBC: 6.7 10*3/uL (ref 4.0–10.5)
nRBC: 0 % (ref 0.0–0.2)

## 2022-05-14 MED ORDER — HYDROXYZINE HCL 10 MG PO TABS: 10.0000 mg | ORAL_TABLET | Freq: Three times a day (TID) | ORAL | 0 refills | Status: AC | PRN

## 2022-05-14 MED ORDER — VITAMIN D3 25 MCG PO TABS: 1000.0000 [IU] | ORAL_TABLET | Freq: Every day | ORAL | 2 refills | Status: AC

## 2022-05-14 MED ORDER — FUROSEMIDE 10 MG/ML IJ SOLN
40.0000 mg | Freq: Once | INTRAMUSCULAR | Status: AC
  Administered 2022-05-14: 40 mg via INTRAVENOUS
  Filled 2022-05-14: qty 4

## 2022-05-14 MED ORDER — FUROSEMIDE 20 MG PO TABS: 20.0000 mg | ORAL_TABLET | Freq: Every day | ORAL | 0 refills | Status: DC

## 2022-05-14 MED ORDER — PANTOPRAZOLE SODIUM 40 MG PO TBEC: 40.0000 mg | DELAYED_RELEASE_TABLET | Freq: Every day | ORAL | 2 refills | Status: DC

## 2022-05-14 MED ORDER — MULTI-VITAMIN/MINERALS PO TABS: 1.0000 | ORAL_TABLET | Freq: Every day | ORAL | 0 refills | Status: AC

## 2022-05-14 MED ORDER — MIRTAZAPINE 15 MG PO TABS: 15.0000 mg | ORAL_TABLET | Freq: Every day | ORAL | 0 refills | Status: DC

## 2022-05-14 MED ORDER — ASCORBIC ACID 250 MG PO TABS: 250.0000 mg | ORAL_TABLET | Freq: Two times a day (BID) | ORAL | 0 refills | Status: AC

## 2022-05-14 MED ORDER — VITAMIN A 3 MG (10000 UNIT) PO CAPS: 10000.0000 [IU] | ORAL_CAPSULE | Freq: Every day | ORAL | 0 refills | Status: AC

## 2022-05-14 MED ORDER — FOLIC ACID 1 MG PO TABS: 1.0000 mg | ORAL_TABLET | Freq: Every day | ORAL | 0 refills | Status: AC

## 2022-05-14 MED ORDER — FLUCONAZOLE 200 MG PO TABS: 200.0000 mg | ORAL_TABLET | Freq: Every day | ORAL | 0 refills | Status: AC

## 2022-05-14 NOTE — Discharge Summary (Signed)
Physician Discharge Summary  TRANISE FORREST DGU:[id number] DOB: [birth year] DOA: 05/01/2022  PCP: Fleet Contras, MD  Admit date: 05/01/2022 Discharge date: 05/14/2022  Admitted From: Home Discharge disposition: Home with outpatient therapy  Recommendations at discharge:  Continue dietary supplements, vitamins as instructed Lasix for 7 days.  Continue applying compression stockings. Follow-up with GI as an outpatient for repeat endoscopy Follow-up with cardiology as an outpatient.  Brief narrative: Clotiel L Starkes is a 41 y.o. female with PMH significant for history of IV drug abuse currently in remission, seizure disorder on Keppra, bipolar 1 disorder, chronic bronchitis. 10/6, patient presented to the ED with complaint of nausea, vomiting for 4 to 6 weeks and 20 pound weight loss In the ED, her blood pressure was in 80s, labs with lactic acid elevated 2.2, WBC count elevated to 14.1 With the concern of septic shock, patient was started on IV empiric antibiotics, IV fluid, required pressors Admitted to ICU 10/12, EGD showed esophageal candidiasis, started on a course of Diflucan. 10/13, transferred to [facility] service  Subjective: Patient was seen and examined this morning.   Propped up in bed not in distress no new symptoms.   Improving bilateral lower extremity edema.   Assessment and plan: Hypovolemic shock Initially presented with nausea, vomiting, failure to thrive for 4 to 6 weeks She was covered with antibiotics for possible septic shock.  No growth in cultures.  Completed a course of empiric antibiotics. Work-up later showed esophageal candidiasis.  Patient apparently was not able to hold any food or drink down because of odynophagia leading to hypovolemic shock. Sepsis ruled out Adequately hydrated. WBC count, lactic acid level normalized. There was also concern of possible adrenal insufficiency but apparently cortisol level ordered cosyntropin stimulation test was not done.   Patient was empirically started on IV hydrocortisone.  It was gradually tapered down.  CT scan with unremarkable adrenal gland. Recent Labs  Lab 05/09/22 0229 05/11/22 0219 05/12/22 0223 05/13/22 0415 05/14/22 0420  WBC 9.9 10.5 9.0 9.1 6.7   Esophageal candidiasis Presented with nausea, vomiting for 2 weeks with 20 pound weight loss  10/12 EGD showed esophageal candidiasis.  Currently on 2 weeks course of Diflucan to complete in 10/27. Odynophagia improving.  Oral intake much improved. Currently on regular diet.  Continue PPI  Bilateral lower extremity edema Patient has 1+ bilateral pedal edema and is also tender to touch. Albumin running low at 2.5. ultrasound duplex bilateral lower extremities negative for DVT.   Given IV Lasix daily for last 3 days.  Continue oral Lasix for next 1 week at home.  Encourage compression bandage. Tenderness on palpation anywhere in her legs could be due to peripheral neuropathy from folic acid deficiency. Expect improvement with folate replacement.  AKI Acute metabolic acidosis Presented with elevated creatinine to 2.23, serum bicarb as low as 11. Improved creatinine and bicarb level with IV hydration and sodium bicarb tablet.  Sodium bicarb tablets were ultimately stopped.  Creatinine level improved with IV Lasix. Recent Labs    05/04/22 0455 05/05/22 0451 05/06/22 1022 05/07/22 0347 05/08/22 0421 05/09/22 0229 05/10/22 0402 05/11/22 0219 05/12/22 0223 05/14/22 0420  BUN 36* 36* 33* 27* 27* 26* 25* 23* 20 18  CREATININE 1.64* 1.60* 1.53* 1.43* 1.52* 1.64* 1.54* 1.23* 1.05* 0.78  CO2 11* 12* 16* 18* 18* 20* 21* 23 27 29    Hypokalemia/hypomagnesemia/hypophosphatemia Replaced trend as below.  Repeat labs tomorrow Recent Labs  Lab 05/08/22 0421 05/09/22 05/11/22 05/10/22 0402 05/11/22 05/13/22 05/12/22 0223 05/14/22  0420  K 4.1 3.8 3.1* 2.8* 3.3* 3.5  MG 2.0  --   --  1.4* 1.9  --   PHOS 1.5* 1.7* 2.2* 2.9 3.1  --    Elevated  LFTs Likely due to shock liver.   Gradually improving LFTs.  INR normal.   10/14, HIDA scan did not suggest cholecystitis. Alpha-1 antitrypsin level low at 96 (range 100-188), serum IgG low, serum ceruloplasmin level low.  Discussed with GI Dr.Stark who responded as ' The A1AT and ceruloplasmin are borderline low which could be nutrition related however they might be clinically significant. Outpatient follow up with Dr. Havery Moros and repeat A1AT, ceruloplasmin in several weeks is reasonable. An elevated IgG would be a supporting test for autoimmune hepatitis so a low level argues against that diagnosis. No additional inpatient LFT evaluation is needed. ' Follow-up with GI as an outpatient.  Referral given.  Recent Labs  Lab 05/09/22 0229 05/10/22 0402 05/11/22 0219 05/12/22 0223 05/13/22 0415 05/14/22 0420  AST 194* 102* 81* 60*  --  60*  ALT 206* 157* 150* 122*  --  108*  ALKPHOS 225* 191* 184* 203*  --  206*  BILITOT 0.3 0.3 0.4 0.2*  --  0.4  PROT 4.7* 4.5* 4.6* 4.8*  --  4.6*  ALBUMIN 2.6* 2.5* 2.5* 2.6*  --  2.4*  INR 1.1  --   --   --   --   --   PLT 277  --  279 286 291 290   Chronic macrocytic anemia Folate deficiency No evidence of bleeding.  Ferritin level normal.  Folic acid level low at 2.3.  Folate deficiency could be the cause of macrocytic anemia.  Folate replacement ordered. Recent Labs    05/05/22 0451 05/05/22 0452 05/08/22 0421 05/09/22 0229 05/11/22 0219 05/12/22 0223 05/13/22 0415 05/14/22 0420  HGB  --   --    < > 8.2* 7.9* 8.2* 7.9* 7.8*  MCV  --   --    < > 98.8 99.6 100.8* 103.4* 104.2*  VITAMINB12 584  --   --   --   --   --   --   --   FOLATE  --  2.3*  --   --   --   --   --   --   FERRITIN  --   --   --  144  --   --   --   --   TIBC  --   --   --  266  --   --   --   --   IRON  --   --   --  35  --   --   --   --   RETICCTPCT  --   --   --  3.7*  --   --   --   --    < > = values in this interval not displayed.   Aortic  insufficiency 10/8, echocardiogram read as severe aortic insufficiency. Seen by cardiology.  Current clinical picture does not correlate with suspicion of severe AI.Marland Kitchen  Recommended follow-up as an outpatient to repeat echo once acute issues are resolved. Follow-up with cardiology as an outpatient   History of seizure disorder Continue Keppra   Severe malnutrition As evidenced by chronic nausea, vomiting, severe muscle depression, severe fat depletion.  Dietitian consult appreciated. Continue dietary intervention, vitamin supplementation.  Psychiatric disorders Seen by psychiatry: "Doubt dx of bipolar disease in chart (see note -  no definite manic episodes, risk-taking behavior largely confined to substance use). Clear PTSD, many risk factors for BPD. Pt seems to benefit from therapeutic conversation/brief psychotherapy interventions" Started on mirtazapine 15 mg nightly, hydroxyzine 10 mg 3 times daily as needed for anxiety. buspirone was stopped Minimize benzodiazepine use given baseline anxiety Psych recommended to f/u with [facility] for outpatient therapy and medication management.  Wounds:  -    Discharge Exam:   Vitals:   05/13/22 2210 05/14/22 0500 05/14/22 0541 05/14/22 0737  BP: 125/86  132/89 128/86  Pulse: 78  88 96  Resp: 16   17  Temp: 98.3 F (36.8 C)  (!) 97.4 F (36.3 C) 97.7 F (36.5 C)  TempSrc:   Oral Oral  SpO2: 100%  100% 100%  Weight:  62.3 kg    Height:        Body mass index is 22.17 kg/m.  General exam: Young Caucasian female.  Not in physical distress Skin: No rashes, lesions or ulcers. HEENT: Atraumatic, normocephalic, no obvious bleeding Lungs: Clear to patient bilaterally CVS: Regular rate and rhythm, no murmur GI/Abd soft, nontender, nondistended, bowel sound present CNS: Alert, awake, oriented x3 Psychiatry: Mood appropriate Extremities: Improving bilateral bilateral pedal edema, tender to touch anywhere in  both legs (not specific to calf)  Follow ups:    Follow-up Information     Jodelle Red, MD Follow up on 06/08/2022.   Specialty: Cardiology Why: @11am  for hospital follow up Contact information: [address] [place] [address] [phone]         [facility] Follow up.   Specialty: Rehabilitation Contact information: [address] [id number] mc [place] [address] [phone]        [facility] Follow up.   Why: [facility] for outpatient therapy and medication management.        Fleet Contras, MD Follow up.   Specialty: Internal Medicine Contact information: [address] [place] [address] [phone]         Benancio Deeds, MD Follow up.   Specialty: Gastroenterology Contact information: [address] [place] [address] [place] [address] [phone]                 Discharge Instructions:   Discharge Instructions     Ambulatory referral to Gastroenterology   Complete by: As directed    Candida esophagitis identified in the hospital. Needs repeat endoscopy.   What is the reason for referral?: Other   Ambulatory referral to Physical Therapy   Complete by: As directed    Iontophoresis - 4 mg/ml of dexamethasone: No   T.E.N.S. Unit Evaluation and Dispense as Indicated: No       Discharge Medications:   Allergies as of 05/14/2022       Reactions   Cucumber Extract Anaphylaxis   Keflex [cephalexin] Nausea And Vomiting   Macrobid [nitrofurantoin Monohydrate Macrocrystals] Hives, Swelling   Naproxen Nausea And Vomiting   Penicillins Nausea And Vomiting   Heavy vomitting Did PCN reaction causing immediate rash, facial/tongue/throat swelling, SOB or lightheadedness with hypotension: No swelling, but im not sure if i got a rash as i was red all over already Did PCN reaction causing severe  rash involving mucus membranes or skin necrosis: No Did a PCN reaction that required hospitalization Yes went to the ED Has patient had a PCN reaction occurring within the last 10 years: No-childhood allergy If all of the  above answers are "NO", then may proceed with   Tramadol Nausea And Vomiting   Watermelon Concentrate Diarrhea   Darvocet [propoxyphene N-acetaminophen] Nausea And Vomiting, Other (See Comments)   migraines   Flagyl [metronidazole Hcl] Hives, Swelling, Rash   Ketorolac Nausea And Vomiting, Other (See Comments)   migraines   Ultram [tramadol Hcl] Nausea And Vomiting, Other (See Comments)   migraines   Vicodin [hydrocodone-acetaminophen] Nausea And Vomiting, Other (See Comments)   migraines        Medication List     STOP taking these medications    beclomethasone 40 MCG/ACT inhaler Commonly known as: QVAR   busPIRone 10 MG tablet Commonly known as: BUSPAR   busPIRone 5 MG tablet Commonly known as: BUSPAR   ciprofloxacin 250 MG tablet Commonly known as: CIPRO   cyclobenzaprine 10 MG tablet Commonly known as: FLEXERIL   diazepam 5 MG tablet Commonly known as: VALIUM   diclofenac Sodium 1 % Gel Commonly known as: VOLTAREN   gabapentin 100 MG capsule Commonly known as: NEURONTIN   IBU 800 MG tablet Generic drug: ibuprofen   ibuprofen 200 MG tablet Commonly known as: ADVIL   levETIRAcetam 500 MG tablet Commonly known as: KEPPRA   nitrofurantoin (macrocrystal-monohydrate) 100 MG capsule Commonly known as: MACROBID   omeprazole 10 MG capsule Commonly known as: PRILOSEC   ondansetron 4 MG disintegrating tablet Commonly known as: ZOFRAN-ODT   Proventil HFA 108 (90 Base) MCG/ACT inhaler Generic drug: albuterol   traMADol 50 MG tablet Commonly known as: ULTRAM   Vitamin D (Ergocalciferol) 1.25 MG (50000 UNIT) Caps capsule Commonly known as: DRISDOL       TAKE these medications    ascorbic acid 250 MG tablet Commonly known as:  VITAMIN C Take 1 tablet (250 mg total) by mouth 2 (two) times daily.   fluconazole 200 MG tablet Commonly known as: DIFLUCAN Take 1 tablet (200 mg total) by mouth daily for 7 days. Start taking on: May 15, 2022   folic acid 1 MG tablet Commonly known as: FOLVITE Take 1 tablet (1 mg total) by mouth daily. Start taking on: May 15, 2022   furosemide 20 MG tablet Commonly known as: Lasix Take 1 tablet (20 mg total) by mouth daily for 7 days.   hydrOXYzine 10 MG tablet Commonly known as: ATARAX Take 1 tablet (10 mg total) by mouth 3 (three) times daily as needed for anxiety.   mirtazapine 15 MG tablet Commonly known as: REMERON Take 1 tablet (15 mg total) by mouth at bedtime.   multivitamin with minerals tablet Take 1 tablet by mouth daily.   pantoprazole 40 MG tablet Commonly known as: PROTONIX Take 1 tablet (40 mg total) by mouth daily. Start taking on: May 15, 2022   vitamin A 3 MG (10000 UNITS) capsule Take 1 capsule (10,000 Units total) by mouth daily. Start taking on: May 15, 2022   vitamin D3 25 MCG tablet Commonly known as: CHOLECALCIFEROL Take 1 tablet (1,000 Units total) by mouth daily. Start taking on: May 15, 2022               Durable Medical Equipment  (From admission, onward)           Start     Ordered   05/14/22 5681  For home use only DME Cane  Once        05/14/22 2751   05/07/22 1619  For home use only DME Walker  Once       Question  Answer Comment  Patient needs a walker to treat with the following condition Difficulty walking   Patient needs a walker to treat with the following condition Other abnormalities of gait and mobility   Patient needs a walker to treat with the following condition Generalized weakness      05/07/22 1618             The results of significant diagnostics from this hospitalization (including imaging, microbiology, ancillary and laboratory) are listed below for reference.     Procedures and Diagnostic Studies:   CT ABDOMEN PELVIS WO CONTRAST  Result Date: 05/02/2022 CLINICAL DATA:  Nausea and vomiting EXAM: CT ABDOMEN AND PELVIS WITHOUT CONTRAST TECHNIQUE: Multidetector CT imaging of the abdomen and pelvis was performed following the standard protocol without IV contrast. RADIATION DOSE REDUCTION: This exam was performed according to the departmental dose-optimization program which includes automated exposure control, adjustment of the mA and/or kV according to patient size and/or use of iterative reconstruction technique. COMPARISON:  None Available. FINDINGS: Motion artifact and attenuation artifact from arm positioning degrades quality of images. Lower chest: Small left pleural effusion with adjacent atelectasis. Normal heart size no pericardial effusion. Hepatobiliary: Normal liver contour no focal hepatic lesions. No biliary ductal dilation. Distended gallbladder. No gallbladder wall thickening or radiopaque gallstones. Pancreas: Unremarkable. No pancreatic ductal dilatation or surrounding inflammatory changes. Spleen: Normal in size without focal abnormality. Adrenals/Urinary Tract: Adrenal glands are unremarkable. No hydronephrosis bladder is distended but otherwise unremarkable. Stomach/Bowel: Stomach is within normal limits. Appendix is not visualized. No evidence of bowel wall thickening or distention. Mild right greater than left paracolic gutter fat stranding. Vascular/Lymphatic: Aortic atherosclerosis. No enlarged abdominal or pelvic lymph nodes. Reproductive: Uterus and bilateral adnexa are unremarkable. Other: No abdominal wall hernia or abnormality. Diffuse soft tissue anasarca. Small volume free fluid in the pelvis. Musculoskeletal: No acute or significant osseous findings. IMPRESSION: 1. Overall limited exam due to motion artifact and attenuation artifact due to arm positioning. 2. Distended gallbladder but no radiopaque gallstones or gallbladder wall  thickening. Correlate with right upper quadrant pain and if there is clinical concern for acute cholecystitis, consider further assessment with gallbladder ultrasound. 3. Sequela of volume overload including small left pleural effusion, pericolic gutter fat stranding, and anasarca. Electronically Signed   By: Jacob Moores M.D.   On: 05/02/2022 13:19   DG CHEST PORT 1 VIEW  Result Date: 05/02/2022 CLINICAL DATA:  Central line placement EXAM: PORTABLE CHEST 1 VIEW COMPARISON:  05/01/2022 FINDINGS: Lungs are clear.  No pleural effusion or pneumothorax. The heart is normal in size. Right IJ venous catheter terminates at the cavoatrial junction. IMPRESSION: Right IJ venous catheter terminates at the cavoatrial junction. No pneumothorax. Electronically Signed   By: Charline Bills M.D.   On: 05/02/2022 03:22   DG Chest Port 1 View  Result Date: 05/01/2022 CLINICAL DATA:  Sepsis. EXAM: PORTABLE CHEST 1 VIEW COMPARISON:  October 01, 2017. FINDINGS: The heart size and mediastinal contours are within normal limits. Both lungs are clear. Hyperexpansion of the lungs is noted. The visualized skeletal structures are unremarkable. IMPRESSION: No acute cardiopulmonary abnormality seen. Hyperexpansion of the lungs. Electronically Signed   By: Lupita Raider M.D.   On: 05/01/2022 16:31     Labs:   Basic Metabolic Panel: Recent Labs  Lab 05/08/22 0421 05/09/22 0229 05/10/22 0402 05/11/22 0219 05/12/22 0223 05/14/22 0420  NA 139 139 142 142 144 142  K 4.1 3.8 3.1* 2.8* 3.3* 3.5  CL 113* 110  114* 109 110 104  CO2 18* 20* 21* 23 27 29   GLUCOSE 107* 112* 107* 97 93 95  BUN 27* 26* 25* 23* 20 18  CREATININE 1.52* 1.64* 1.54* 1.23* 1.05* 0.78  CALCIUM 7.9* 8.0* 7.9* 7.8* 8.1* 7.9*  MG 2.0  --   --  1.4* 1.9  --   PHOS 1.5* 1.7* 2.2* 2.9 3.1  --    GFR Estimated Creatinine Clearance: 87.5 mL/min (by C-G formula based on SCr of 0.78 mg/dL). Liver Function Tests: Recent Labs  Lab 05/09/22 0229  05/10/22 0402 05/11/22 0219 05/12/22 0223 05/14/22 0420  AST 194* 102* 81* 60* 60*  ALT 206* 157* 150* 122* 108*  ALKPHOS 225* 191* 184* 203* 206*  BILITOT 0.3 0.3 0.4 0.2* 0.4  PROT 4.7* 4.5* 4.6* 4.8* 4.6*  ALBUMIN 2.6* 2.5* 2.5* 2.6* 2.4*   No results for input(s): "LIPASE", "AMYLASE" in the last 168 hours. No results for input(s): "AMMONIA" in the last 168 hours. Coagulation profile Recent Labs  Lab 05/09/22 0229  INR 1.1    CBC: Recent Labs  Lab 05/09/22 0229 05/11/22 0219 05/12/22 0223 05/13/22 0415 05/14/22 0420  WBC 9.9 10.5 9.0 9.1 6.7  NEUTROABS 8.3* 8.2* 7.0 6.2 4.1  HGB 8.2* 7.9* 8.2* 7.9* 7.8*  HCT 24.4* 23.8* 24.6* 24.3* 24.7*  MCV 98.8 99.6 100.8* 103.4* 104.2*  PLT 277 279 286 291 290   Cardiac Enzymes: Recent Labs  Lab 05/09/22 0229  CKTOTAL 126   BNP: Invalid input(s): "POCBNP" CBG: No results for input(s): "GLUCAP" in the last 168 hours. D-Dimer No results for input(s): "DDIMER" in the last 72 hours. Hgb A1c No results for input(s): "HGBA1C" in the last 72 hours. Lipid Profile No results for input(s): "CHOL", "HDL", "LDLCALC", "TRIG", "CHOLHDL", "LDLDIRECT" in the last 72 hours. Thyroid function studies No results for input(s): "TSH", "T4TOTAL", "T3FREE", "THYROIDAB" in the last 72 hours.  Invalid input(s): "FREET3" Anemia work up No results for input(s): "VITAMINB12", "FOLATE", "FERRITIN", "TIBC", "IRON", "RETICCTPCT" in the last 72 hours. Microbiology No results found for this or any previous visit (from the past 240 hour(s)).  Time coordinating discharge: 35 minutes  Signed: Icelyn Navarrete  [facility] 05/14/2022, 1:02 PM

## 2022-05-14 NOTE — Discharge Instructions (Addendum)
Please follow up with [facility]al [facility] for outpatient therapy at [phone]  Your EGD on 10/12 showed esophageal candidiasis.  Currently on course of Diflucan  Recommendations at discharge:  Continue dietary supplements, vitamins as instructed Lasix for 7 days.  Continue applying compression stockings. Follow-up with GI as an outpatient for repeat endoscopy Follow-up with cardiology as an outpatient.

## 2022-05-14 NOTE — TOC Progression Note (Signed)
Transition of Care [facility]) - Progression Note    Patient Details  Name: Jamie Shields MRN: [id number] Date of Birth: 01-29-1981  [facility] CM/SW Contact  Jacalyn Lefevre, Edson Snowball, RN Phone Number: 05/14/2022, 8:12 AM  Clinical Narrative:     Ordered cane through [facility]   Expected Discharge Plan: Home/Self Care Barriers to Discharge: Continued Medical Work up  Expected Discharge Plan and Services Expected Discharge Plan: Home/Self Care   Discharge Planning Services: CM Consult Post Acute Care Choice:  (outpatient rehab) Living arrangements for the past 2 months:  ([place])                 DME Arranged:  (patient declined walker)         HH Arranged: NA           Social Determinants of Health (SDOH) Interventions    Readmission Risk Interventions     No data to display

## 2022-05-15 ENCOUNTER — Ambulatory Visit (HOSPITAL_COMMUNITY): Payer: Medicaid Other

## 2022-05-22 ENCOUNTER — Ambulatory Visit (HOSPITAL_COMMUNITY): Payer: Medicaid Other

## 2022-05-27 DIAGNOSIS — Z419 Encounter for procedure for purposes other than remedying health state, unspecified: Secondary | ICD-10-CM | POA: Diagnosis not present

## 2022-05-28 ENCOUNTER — Ambulatory Visit (HOSPITAL_COMMUNITY): Payer: Medicaid Other | Admitting: Student

## 2022-05-28 ENCOUNTER — Ambulatory Visit (HOSPITAL_COMMUNITY): Payer: Self-pay | Admitting: Mental Health

## 2022-05-29 ENCOUNTER — Ambulatory Visit
Admission: EM | Admit: 2022-05-29 | Discharge: 2022-05-29 | Disposition: A | Discharge status: Home / Self Care | Payer: Medicaid Other

## 2022-05-29 DIAGNOSIS — R000 Tachycardia, unspecified: Secondary | ICD-10-CM | POA: Diagnosis not present

## 2022-05-29 DIAGNOSIS — R0602 Shortness of breath: Secondary | ICD-10-CM

## 2022-05-29 DIAGNOSIS — R2243 Localized swelling, mass and lump, lower limb, bilateral: Secondary | ICD-10-CM

## 2022-05-29 DIAGNOSIS — L03116 Cellulitis of left lower limb: Secondary | ICD-10-CM | POA: Diagnosis not present

## 2022-05-29 HISTORY — DX: Shock, unspecified: R57.9

## 2022-05-29 NOTE — ED Triage Notes (Addendum)
[facility] presents to [facility] with concerns of bilateral lower leg swelling. Pt reports she was recently in the hospital and she is not sure why but she ended up in the [facility]uc and has since had swelling was taking lasix 7 day course after discharge but has finishes. In search of new pcp and has not seen a cardiologist since discharge due to no phone at the moment. Pain is 8/10 when moving. 6/10 when not moving. Elvation does not seem to help. Has been taking tylenol for pain.   Upon chart review pt was admitted for esophageal candisis and hypovolemic shock.  Pt denies iv drug use  Pt arrives with compression bandages in place 2+pitting edema bilateral LE  1+ pedal pulses bilateral  Unable to find pt pulses

## 2022-05-29 NOTE — ED Notes (Signed)
Patient is being discharged from the Urgent Care and sent to the Emergency Department via pov . Per mound, np , patient is in need of higher level of care due to vital signs and symptoms. Patient is aware and verbalizes understanding of plan of care.  Vitals:   05/29/22 1815  BP: (!) 153/94  Pulse: (!) 122  Resp: 18  Temp: (!) 97.3 F (36.3 C)  SpO2: 97%

## 2022-05-29 NOTE — Discharge Instructions (Signed)
Go to the emergency department as soon as you leave urgent care for further evaluation and management. 

## 2022-05-29 NOTE — ED Provider Notes (Signed)
[facility]    CSN: [id number] Arrival date & time: 05/29/22  1736      History   Chief Complaint Chief Complaint  Patient presents with   Leg Pain    HPI Lillyann L Ghazarian is a 41 y.o. female.   Patient presents with bilateral lower leg and feet swelling and pain that has been present for multiple weeks.  She reports that it has seemed to worsen but she does not look at it because she "does not look like to look at it".  Patient states that the swelling has seemed to travel up to the thighs.  She had negative DVT ultrasounds in the hospital.  Denies any associated fever, body aches, chills.  She was recently admitted to the hospital for hypovolemic shock, AKI, esophageal candidiasis amongst other complications.  She was admitted to the ICU and put on vasopressors.  She states that she had the bilateral lower leg swelling when she was discharged and was put on Lasix for a week with no improvement.  Denies chest pain or palpitations but does report some intermittent shortness of breath that has also been present since she was discharged from the hospital.   Leg Pain   Past Medical History:  Diagnosis Date   Anxiety    Bipolar 1 disorder (HCC)    Bursitis of hip    Chronic bronchitis    Hip joint pain    Seizures (HCC)    Shock (HCC)    hypovolemic    Patient Active Problem List   Diagnosis Date Noted   Candida esophagitis (HCC)    LFTs abnormal    Loss of weight 05/06/2022   Normal anion gap metabolic acidosis 05/05/2022   DNR (do not resuscitate) 05/05/2022   Adrenal insufficiency (HCC) 05/05/2022   History of seizures 05/05/2022   Seizure disorder (HCC) 05/04/2022   Bipolar disease, chronic (HCC) 05/04/2022   Hypovolemic shock (HCC) 05/01/2022   Severe dehydration 05/01/2022   Nausea and vomiting 05/01/2022   Disorders of fluid, electrolyte, and acid-base balance 05/01/2022   Hyponatremia 05/01/2022   AKI (acute kidney injury) (HCC) 05/01/2022   Severe  protein-calorie malnutrition (HCC) 05/01/2022   Bursitis of hip     Past Surgical History:  Procedure Laterality Date   BIOPSY  05/07/2022   Procedure: BIOPSY;  Surgeon: Benancio Deeds, MD;  Location: [facility] ENDOSCOPY;  Service: Gastroenterology;;   CESAREAN SECTION     2002   ESOPHAGOGASTRODUODENOSCOPY (EGD) WITH PROPOFOL N/A 05/07/2022   Procedure: ESOPHAGOGASTRODUODENOSCOPY (EGD) WITH PROPOFOL;  Surgeon: Benancio Deeds, MD;  Location: [facility] ENDOSCOPY;  Service: Gastroenterology;  Laterality: N/A;   KNEE ARTHROSCOPY     MOUTH SURGERY  2010    OB History     Gravida  3   Para  3   Term      Preterm      AB      Living         SAB      IAB      Ectopic      Multiple      Live Births               Home Medications    Prior to Admission medications   Medication Sig Start Date End Date Taking? Authorizing Provider  ascorbic acid (VITAMIN C) 250 MG tablet Take 1 tablet (250 mg total) by mouth 2 (two) times daily. 05/14/22 08/12/22  Lorin Glass, MD  cholecalciferol (CHOLECALCIFEROL) 25 MCG  tablet Take 1 tablet (1,000 Units total) by mouth daily. 05/15/22 08/13/22  Terrilee Croak, MD  folic acid (FOLVITE) 1 MG tablet Take 1 tablet (1 mg total) by mouth daily. 05/15/22 08/13/22  Terrilee Croak, MD  furosemide (LASIX) 20 MG tablet Take 1 tablet (20 mg total) by mouth daily for 7 days. Patient not taking: Reported on 05/29/2022 05/14/22 05/21/22  Terrilee Croak, MD  hydrOXYzine (ATARAX) 10 MG tablet Take 1 tablet (10 mg total) by mouth 3 (three) times daily as needed for anxiety. 05/14/22 06/13/22  Terrilee Croak, MD  mirtazapine (REMERON) 15 MG tablet Take 1 tablet (15 mg total) by mouth at bedtime. Patient not taking: Reported on 05/29/2022 05/14/22 06/13/22  Terrilee Croak, MD  Multiple Vitamins-Minerals (MULTIVITAMIN WITH MINERALS) tablet Take 1 tablet by mouth daily. 05/14/22 08/12/22  Terrilee Croak, MD  pantoprazole (PROTONIX) 40 MG tablet Take 1 tablet (40 mg  total) by mouth daily. 05/15/22 08/13/22  Terrilee Croak, MD  vitamin A 3 MG (10000 UNITS) capsule Take 1 capsule (10,000 Units total) by mouth daily. 05/15/22 08/13/22  Terrilee Croak, MD    Family History Family History  Problem Relation Age of Onset   Hypertension Mother    Diabetes Other    Cancer Other     Social History Social History   Tobacco Use   Smoking status: Former    Packs/day: 0.50    Types: Cigarettes   Smokeless tobacco: Never  Vaping Use   Vaping Use: Some days  Substance Use Topics   Alcohol use: Yes    Comment: rarely   Drug use: No     Allergies   Cucumber extract, Keflex [cephalexin], Macrobid [nitrofurantoin monohydrate macrocrystals], Naproxen, Penicillins, Tramadol, Watermelon concentrate, Darvocet [propoxyphene n-acetaminophen], Flagyl [metronidazole hcl], Ketorolac, Ultram [tramadol hcl], and Vicodin [hydrocodone-acetaminophen]   Review of Systems Review of Systems Per HPI  Physical Exam Triage Vital Signs ED Triage Vitals  Enc Vitals Group     BP 05/29/22 1815 (!) 153/94     Pulse Rate 05/29/22 1815 (!) 122     Resp 05/29/22 1815 18     Temp 05/29/22 1815 (!) 97.3 F (36.3 C)     Temp Source 05/29/22 1815 Oral     SpO2 05/29/22 1815 97 %     Weight --      Height --      Head Circumference --      Peak Flow --      Pain Score 05/29/22 1810 8     Pain Loc --      Pain Edu? --      Excl. in [place]? --    No data found.  Updated Vital Signs BP (!) 153/94   Pulse (!) 122   Temp (!) 97.3 F (36.3 C) (Oral)   Resp 18   LMP  (LMP Unknown)   SpO2 97%   Visual Acuity Right Eye Distance:   Left Eye Distance:   Bilateral Distance:    Right Eye Near:   Left Eye Near:    Bilateral Near:     Physical Exam Constitutional:      General: She is not in acute distress.    Appearance: Normal appearance. She is not toxic-appearing or diaphoretic.  HENT:     Head: Normocephalic and atraumatic.  Eyes:     Extraocular Movements:  Extraocular movements intact.     Conjunctiva/sclera: Conjunctivae normal.  Cardiovascular:     Rate and Rhythm: Regular rhythm. Tachycardia present.  Heart sounds: Normal heart sounds.  Pulmonary:     Effort: Pulmonary effort is normal. No respiratory distress.     Breath sounds: Normal breath sounds.  Skin:    Comments: Significant nonpitting edema present to bilateral lower extremities that extends into posterior thighs.  Significant erythema present to bilateral dorsal surface of feet with left foot being most severe.  Also has a scaly appearance/dry skin.  Unable to assess pedal pulses given amount of swelling.  Patient has full ROM of toes.  Neurological:     General: No focal deficit present.     Mental Status: She is alert and oriented to person, place, and time. Mental status is at baseline.  Psychiatric:        Mood and Affect: Mood normal.        Behavior: Behavior normal.        Thought Content: Thought content normal.        Judgment: Judgment normal.      UC Treatments / Results  Labs (all labs ordered are listed, but only abnormal results are displayed) Labs Reviewed - No data to display  EKG   Radiology No results found.  Procedures Procedures (including critical care time)  Medications Ordered in UC Medications - No data to display  Initial Impression / Assessment and Plan / UC Course  I have reviewed the triage vital signs and the nursing notes.  Pertinent labs & imaging results that were available during my care of the patient were reviewed by me and considered in my medical decision making (see chart for details).     Patient appears to have significant cellulitis to lower extremities on exam.  Patient is also tachycardic with heart rate at 122.  Advised patient that it would be best to go to the hospital for further evaluation and management given appearance on physical exam and associated tachycardia to ensure no worrisome or systemic  complications are present especially given patient's recent hospital admission.  Patient was agreeable with plan.  Suggested EKG to patient but she declined as she wished for this to be completed at hospital with full work-up.  Patient had family member here to transport her and wishes self transport. Final Clinical Impressions(s) / UC Diagnoses   Final diagnoses:  Localized swelling, mass, or lump of lower extremity, bilateral  Cellulitis of left lower extremity  Tachycardia  Shortness of breath     Discharge Instructions      Go to the emergency department as soon as you leave urgent care for further evaluation and management.    ED Prescriptions   None    PDMP not reviewed this encounter.   Gustavus Bryant, [place] 05/29/22 1836

## 2022-05-30 ENCOUNTER — Other Ambulatory Visit: Payer: Self-pay

## 2022-05-30 ENCOUNTER — Encounter (HOSPITAL_COMMUNITY): Payer: Self-pay

## 2022-05-30 ENCOUNTER — Emergency Department (HOSPITAL_COMMUNITY): Payer: Medicaid Other

## 2022-05-30 ENCOUNTER — Inpatient Hospital Stay (HOSPITAL_COMMUNITY)
Admission: EM | Admit: 2022-05-30 | Discharge: 2022-06-04 | DRG: 286 | Disposition: A | Discharge status: Home / Self Care | Payer: Medicaid Other | Attending: Internal Medicine | Admitting: Internal Medicine

## 2022-05-30 DIAGNOSIS — L03115 Cellulitis of right lower limb: Secondary | ICD-10-CM | POA: Diagnosis not present

## 2022-05-30 DIAGNOSIS — Z87892 Personal history of anaphylaxis: Secondary | ICD-10-CM

## 2022-05-30 DIAGNOSIS — Z66 Do not resuscitate: Secondary | ICD-10-CM | POA: Diagnosis present

## 2022-05-30 DIAGNOSIS — Z8249 Family history of ischemic heart disease and other diseases of the circulatory system: Secondary | ICD-10-CM

## 2022-05-30 DIAGNOSIS — R0602 Shortness of breath: Secondary | ICD-10-CM | POA: Diagnosis not present

## 2022-05-30 DIAGNOSIS — Z886 Allergy status to analgesic agent status: Secondary | ICD-10-CM

## 2022-05-30 DIAGNOSIS — R54 Age-related physical debility: Secondary | ICD-10-CM | POA: Diagnosis present

## 2022-05-30 DIAGNOSIS — F419 Anxiety disorder, unspecified: Secondary | ICD-10-CM | POA: Diagnosis not present

## 2022-05-30 DIAGNOSIS — I428 Other cardiomyopathies: Secondary | ICD-10-CM | POA: Diagnosis not present

## 2022-05-30 DIAGNOSIS — I3139 Other pericardial effusion (noninflammatory): Secondary | ICD-10-CM | POA: Diagnosis present

## 2022-05-30 DIAGNOSIS — Z87891 Personal history of nicotine dependence: Secondary | ICD-10-CM | POA: Diagnosis not present

## 2022-05-30 DIAGNOSIS — I272 Pulmonary hypertension, unspecified: Secondary | ICD-10-CM | POA: Diagnosis not present

## 2022-05-30 DIAGNOSIS — I371 Nonrheumatic pulmonary valve insufficiency: Secondary | ICD-10-CM | POA: Diagnosis present

## 2022-05-30 DIAGNOSIS — Z91018 Allergy to other foods: Secondary | ICD-10-CM

## 2022-05-30 DIAGNOSIS — I5043 Acute on chronic combined systolic (congestive) and diastolic (congestive) heart failure: Secondary | ICD-10-CM | POA: Diagnosis not present

## 2022-05-30 DIAGNOSIS — I351 Nonrheumatic aortic (valve) insufficiency: Secondary | ICD-10-CM | POA: Diagnosis present

## 2022-05-30 DIAGNOSIS — Z79899 Other long term (current) drug therapy: Secondary | ICD-10-CM

## 2022-05-30 DIAGNOSIS — I429 Cardiomyopathy, unspecified: Secondary | ICD-10-CM | POA: Diagnosis not present

## 2022-05-30 DIAGNOSIS — E876 Hypokalemia: Secondary | ICD-10-CM | POA: Diagnosis not present

## 2022-05-30 DIAGNOSIS — J9601 Acute respiratory failure with hypoxia: Secondary | ICD-10-CM | POA: Diagnosis not present

## 2022-05-30 DIAGNOSIS — J42 Unspecified chronic bronchitis: Secondary | ICD-10-CM | POA: Diagnosis present

## 2022-05-30 DIAGNOSIS — G40909 Epilepsy, unspecified, not intractable, without status epilepticus: Secondary | ICD-10-CM | POA: Diagnosis present

## 2022-05-30 DIAGNOSIS — R000 Tachycardia, unspecified: Secondary | ICD-10-CM | POA: Diagnosis not present

## 2022-05-30 DIAGNOSIS — L03116 Cellulitis of left lower limb: Secondary | ICD-10-CM | POA: Diagnosis not present

## 2022-05-30 DIAGNOSIS — Z880 Allergy status to penicillin: Secondary | ICD-10-CM

## 2022-05-30 DIAGNOSIS — Z881 Allergy status to other antibiotic agents status: Secondary | ICD-10-CM

## 2022-05-30 DIAGNOSIS — Z87898 Personal history of other specified conditions: Secondary | ICD-10-CM | POA: Diagnosis not present

## 2022-05-30 DIAGNOSIS — J90 Pleural effusion, not elsewhere classified: Secondary | ICD-10-CM | POA: Diagnosis not present

## 2022-05-30 DIAGNOSIS — Q2112 Patent foramen ovale: Secondary | ICD-10-CM | POA: Diagnosis not present

## 2022-05-30 DIAGNOSIS — Z888 Allergy status to other drugs, medicaments and biological substances status: Secondary | ICD-10-CM

## 2022-05-30 DIAGNOSIS — F4310 Post-traumatic stress disorder, unspecified: Secondary | ICD-10-CM | POA: Diagnosis present

## 2022-05-30 DIAGNOSIS — F319 Bipolar disorder, unspecified: Secondary | ICD-10-CM | POA: Diagnosis present

## 2022-05-30 DIAGNOSIS — I509 Heart failure, unspecified: Secondary | ICD-10-CM | POA: Diagnosis not present

## 2022-05-30 DIAGNOSIS — E878 Other disorders of electrolyte and fluid balance, not elsewhere classified: Secondary | ICD-10-CM | POA: Diagnosis present

## 2022-05-30 DIAGNOSIS — L0390 Cellulitis, unspecified: Principal | ICD-10-CM

## 2022-05-30 DIAGNOSIS — Z885 Allergy status to narcotic agent status: Secondary | ICD-10-CM

## 2022-05-30 DIAGNOSIS — E43 Unspecified severe protein-calorie malnutrition: Secondary | ICD-10-CM | POA: Diagnosis present

## 2022-05-30 DIAGNOSIS — I083 Combined rheumatic disorders of mitral, aortic and tricuspid valves: Secondary | ICD-10-CM | POA: Diagnosis not present

## 2022-05-30 DIAGNOSIS — I5041 Acute combined systolic (congestive) and diastolic (congestive) heart failure: Secondary | ICD-10-CM | POA: Diagnosis not present

## 2022-05-30 DIAGNOSIS — E861 Hypovolemia: Secondary | ICD-10-CM | POA: Diagnosis not present

## 2022-05-30 DIAGNOSIS — Z681 Body mass index (BMI) 19 or less, adult: Secondary | ICD-10-CM | POA: Diagnosis not present

## 2022-05-30 DIAGNOSIS — K219 Gastro-esophageal reflux disease without esophagitis: Secondary | ICD-10-CM | POA: Diagnosis present

## 2022-05-30 DIAGNOSIS — M7989 Other specified soft tissue disorders: Secondary | ICD-10-CM | POA: Diagnosis not present

## 2022-05-30 LAB — COMPREHENSIVE METABOLIC PANEL
ALT: 95 U/L — ABNORMAL HIGH (ref 0–44)
AST: 34 U/L (ref 15–41)
Albumin: 3 g/dL — ABNORMAL LOW (ref 3.5–5.0)
Alkaline Phosphatase: 146 U/L — ABNORMAL HIGH (ref 38–126)
Anion gap: 10 (ref 5–15)
BUN: 6 mg/dL (ref 6–20)
CO2: 28 mmol/L (ref 22–32)
Calcium: 9.1 mg/dL (ref 8.9–10.3)
Chloride: 104 mmol/L (ref 98–111)
Creatinine, Ser: 0.7 mg/dL (ref 0.44–1.00)
GFR, Estimated: >60 mL/min (ref >60)
Glucose, Bld: 126 mg/dL — ABNORMAL HIGH (ref 70–99)
Potassium: 2.4 mmol/L — CL (ref 3.5–5.1)
Sodium: 142 mmol/L (ref 135–145)
Total Bilirubin: 0.2 mg/dL — ABNORMAL LOW (ref 0.3–1.2)
Total Protein: 6 g/dL — ABNORMAL LOW (ref 6.5–8.1)

## 2022-05-30 LAB — URINALYSIS, ROUTINE W REFLEX MICROSCOPIC
Bacteria, UA: NONE SEEN
Bilirubin Urine: NEGATIVE
Glucose, UA: NEGATIVE mg/dL
Hgb urine dipstick: NEGATIVE
Ketones, ur: NEGATIVE mg/dL
Nitrite: NEGATIVE
Protein, ur: NEGATIVE mg/dL
Specific Gravity, Urine: 1.006 (ref 1.005–1.030)
pH: 7 (ref 5.0–8.0)

## 2022-05-30 LAB — CBC
HCT: 31.8 % — ABNORMAL LOW (ref 36.0–46.0)
Hemoglobin: 10 g/dL — ABNORMAL LOW (ref 12.0–15.0)
MCH: 31.1 pg (ref 26.0–34.0)
MCHC: 31.4 g/dL (ref 30.0–36.0)
MCV: 98.8 fL (ref 80.0–100.0)
Platelets: 565 10*3/uL — ABNORMAL HIGH (ref 150–400)
RBC: 3.22 MIL/uL — ABNORMAL LOW (ref 3.87–5.11)
RDW: 15.4 % (ref 11.5–15.5)
WBC: 7 10*3/uL (ref 4.0–10.5)
nRBC: 0 % (ref 0.0–0.2)

## 2022-05-30 LAB — LACTIC ACID, PLASMA: Lactic Acid, Venous: 1.7 mmol/L (ref 0.5–1.9)

## 2022-05-30 LAB — MAGNESIUM: Magnesium: 1.4 mg/dL — ABNORMAL LOW (ref 1.7–2.4)

## 2022-05-30 LAB — BRAIN NATRIURETIC PEPTIDE: B Natriuretic Peptide: 4465.1 pg/mL — ABNORMAL HIGH (ref 0.0–100.0)

## 2022-05-30 MED ORDER — POTASSIUM CHLORIDE 10 MEQ/100ML IV SOLN
10.0000 meq | INTRAVENOUS | Status: AC
  Administered 2022-05-30 (×4): 10 meq via INTRAVENOUS
  Filled 2022-05-30 (×4): qty 100

## 2022-05-30 MED ORDER — ONDANSETRON HCL 4 MG/2ML IJ SOLN
4.0000 mg | Freq: Once | INTRAMUSCULAR | Status: AC
  Administered 2022-05-30: 4 mg via INTRAVENOUS
  Filled 2022-05-30: qty 2

## 2022-05-30 MED ORDER — ACETAMINOPHEN 325 MG PO TABS
650.0000 mg | ORAL_TABLET | ORAL | Status: DC | PRN
  Administered 2022-05-31 – 2022-06-02 (×5): 650 mg via ORAL
  Filled 2022-05-30 (×6): qty 2

## 2022-05-30 MED ORDER — MAGNESIUM SULFATE 2 GM/50ML IV SOLN
2.0000 g | Freq: Once | INTRAVENOUS | Status: AC
  Administered 2022-05-30: 2 g via INTRAVENOUS
  Filled 2022-05-30: qty 50

## 2022-05-30 MED ORDER — ACETAMINOPHEN 500 MG PO TABS
1000.0000 mg | ORAL_TABLET | Freq: Once | ORAL | Status: AC
  Administered 2022-05-30: 1000 mg via ORAL
  Filled 2022-05-30: qty 2

## 2022-05-30 MED ORDER — MIRTAZAPINE 15 MG PO TABS
15.0000 mg | ORAL_TABLET | Freq: Every day | ORAL | Status: DC
  Administered 2022-05-30 – 2022-06-03 (×5): 15 mg via ORAL
  Filled 2022-05-30 (×5): qty 1

## 2022-05-30 MED ORDER — SODIUM CHLORIDE 0.9 % IV SOLN
1.0000 g | Freq: Once | INTRAVENOUS | Status: AC
  Administered 2022-05-30: 1 g via INTRAVENOUS
  Filled 2022-05-30: qty 10

## 2022-05-30 MED ORDER — ONDANSETRON HCL 4 MG/2ML IJ SOLN
4.0000 mg | Freq: Four times a day (QID) | INTRAMUSCULAR | Status: DC | PRN
  Administered 2022-05-31: 4 mg via INTRAVENOUS
  Filled 2022-05-30: qty 2

## 2022-05-30 MED ORDER — FUROSEMIDE 10 MG/ML IJ SOLN
40.0000 mg | Freq: Two times a day (BID) | INTRAMUSCULAR | Status: DC
  Administered 2022-05-31: 40 mg via INTRAVENOUS
  Filled 2022-05-30: qty 4

## 2022-05-30 MED ORDER — POTASSIUM CHLORIDE CRYS ER 20 MEQ PO TBCR
40.0000 meq | EXTENDED_RELEASE_TABLET | Freq: Once | ORAL | Status: AC
  Administered 2022-05-30: 40 meq via ORAL
  Filled 2022-05-30: qty 2

## 2022-05-30 MED ORDER — MORPHINE SULFATE (PF) 2 MG/ML IV SOLN
2.0000 mg | INTRAVENOUS | Status: DC | PRN
  Administered 2022-05-30 – 2022-06-02 (×10): 2 mg via INTRAVENOUS
  Filled 2022-05-30 (×10): qty 1

## 2022-05-30 NOTE — ED Notes (Signed)
Lab called pot low  triage pa notified pt is a acuity 2 already

## 2022-05-30 NOTE — H&P (Signed)
History and Physical    Patient: Jamie Shields XHB:[id number] DOB: 1980/09/30 DOA: 05/30/2022 DOS: the patient was seen and examined on 05/30/2022 PCP: Patient, No Pcp Per  Patient coming from: Home  Chief Complaint: No chief complaint on file.  HPI: Jamie Shields is a 41 y.o. female with medical history significant of bipolar 1 disorder, chronic bronchitis, seizure disorder, recent hypovolemic shock on May 02, 2022, remote IV drug abuse, who has noticed gradual worsening of lower extremity edema over the last few weeks.  This has become difficult for her to walk and this pain rated at 8 out of 10.  Also some shortness of breath with activities.  She has no prior cardiac history.  Patient went to the urgent care center today where she was evaluated.  She was sent over to the ER for further evaluation.  In the ER she was seen to have 3+ pedal edema.  Also x-ray showing bilateral pulmonary edema.  She also has some electrolyte abnormalities including hypokalemia and hypomagnesemia.  LFTs also elevated with low albumin of 3.0.  Patient suspected to have had acute cardiomyopathy following her hypovolemic shock that required ICU stay with intubation and subsequent massive fluid resuscitation.  Patient has had prior x-ray of of the legs as well as Doppler ultrasound both of which were negative.  Review of Systems: As mentioned in the history of present illness. All other systems reviewed and are negative. Past Medical History:  Diagnosis Date   Anxiety    Bipolar 1 disorder ([place])    Bursitis of hip    Chronic bronchitis    Hip joint pain    Seizures ([place])    Shock ([place])    hypovolemic   Past Surgical History:  Procedure Laterality Date   BIOPSY  05/07/2022   Procedure: BIOPSY;  Surgeon: Yetta Flock, MD;  Location: [facility] ENDOSCOPY;  Service: Gastroenterology;;   CESAREAN SECTION     2002   ESOPHAGOGASTRODUODENOSCOPY (EGD) WITH PROPOFOL N/A 05/07/2022   Procedure:  ESOPHAGOGASTRODUODENOSCOPY (EGD) WITH PROPOFOL;  Surgeon: Yetta Flock, MD;  Location: [facility] ENDOSCOPY;  Service: Gastroenterology;  Laterality: N/A;   KNEE ARTHROSCOPY     MOUTH SURGERY  2010   Social History:  reports that she has quit smoking. Her smoking use included cigarettes. She smoked an average of .5 packs per day. She has never used smokeless tobacco. She reports current alcohol use. She reports that she does not use drugs.  Allergies  Allergen Reactions   Cucumber Extract Anaphylaxis   Keflex [Cephalexin] Nausea And Vomiting   Macrobid [Nitrofurantoin Monohydrate Macrocrystals] Hives and Swelling   Naproxen Nausea And Vomiting   Penicillins Nausea And Vomiting    Heavy vomitting Did PCN reaction causing immediate rash, facial/tongue/throat swelling, SOB or lightheadedness with hypotension: No swelling, but im not sure if i got a rash as i was red all over already Did PCN reaction causing severe rash involving mucus membranes or skin necrosis: No Did a PCN reaction that required hospitalization Yes went to the ED Has patient had a PCN reaction occurring within the last 10 years: No-childhood allergy If all of the above answers are "NO", then may proceed with   Tramadol Nausea And Vomiting   Watermelon Concentrate Diarrhea   Darvocet [Propoxyphene N-Acetaminophen] Nausea And Vomiting and Other (See Comments)    migraines   Flagyl [Metronidazole Hcl] Hives, Swelling and Rash   Ketorolac Nausea And Vomiting and Other (See Comments)    migraines   Ultram [  Tramadol Hcl] Nausea And Vomiting and Other (See Comments)    migraines   Vicodin [Hydrocodone-Acetaminophen] Nausea And Vomiting and Other (See Comments)    migraines    Family History  Problem Relation Age of Onset   Hypertension Mother    Diabetes Other    Cancer Other     Prior to Admission medications   Medication Sig Start Date End Date Taking? Authorizing Provider  ascorbic acid (VITAMIN C) 250 MG tablet  Take 1 tablet (250 mg total) by mouth 2 (two) times daily. 05/14/22 08/12/22 Yes Dahal, Melina Schools, MD  cholecalciferol (CHOLECALCIFEROL) 25 MCG tablet Take 1 tablet (1,000 Units total) by mouth daily. 05/15/22 08/13/22 Yes Dahal, Melina Schools, MD  folic acid (FOLVITE) 1 MG tablet Take 1 tablet (1 mg total) by mouth daily. 05/15/22 08/13/22 Yes Dahal, Melina Schools, MD  hydrOXYzine (ATARAX) 10 MG tablet Take 1 tablet (10 mg total) by mouth 3 (three) times daily as needed for anxiety. 05/14/22 06/13/22 Yes Dahal, Melina Schools, MD  mirtazapine (REMERON) 15 MG tablet Take 1 tablet (15 mg total) by mouth at bedtime. 05/14/22 06/13/22 Yes Dahal, Melina Schools, MD  Multiple Vitamins-Minerals (MULTIVITAMIN WITH MINERALS) tablet Take 1 tablet by mouth daily. 05/14/22 08/12/22 Yes Dahal, Melina Schools, MD  pantoprazole (PROTONIX) 40 MG tablet Take 1 tablet (40 mg total) by mouth daily. 05/15/22 08/13/22 Yes Dahal, Melina Schools, MD  vitamin A 3 MG (10000 UNITS) capsule Take 1 capsule (10,000 Units total) by mouth daily. 05/15/22 08/13/22 Yes Dahal, Melina Schools, MD  furosemide (LASIX) 20 MG tablet Take 1 tablet (20 mg total) by mouth daily for 7 days. Patient not taking: Reported on 05/29/2022 05/14/22 05/21/22  Lorin Glass, MD    Physical Exam: Vitals:   05/30/22 1707 05/30/22 1707 05/30/22 1758 05/30/22 2012  BP:  (!) 144/99 (!) 152/114 (!) 139/103  Pulse:  (!) 110 (!) 112 (!) 109  Resp:  (!) 24 (!) 22 (!) 22  Temp: 98.2 F (36.8 C)  98.2 F (36.8 C)   TempSrc: Oral  Oral   SpO2:  100% 100% 98%   Constitutional: Acutely ill looking, NAD, calm, comfortable Eyes: PERRL, lids and conjunctivae normal ENMT: Mucous membranes are moist. Posterior pharynx clear of any exudate or lesions.Normal dentition.  Neck: normal, supple, no masses, no thyromegaly Respiratory: Coarse breath sound bilaterally with some crackles, bilaterally, no wheezing. Normal respiratory effort. No accessory muscle use.  Cardiovascular: Sinus tachycardia, no murmurs / rubs / gallops.   3+ pedal edema. 2+ pedal pulses. No carotid bruits.  Abdomen: no tenderness, no masses palpated. No hepatosplenomegaly. Bowel sounds positive.  Musculoskeletal: Good range of motion, no joint swelling or tenderness, Skin: no rashes, lesions, ulcers. No induration Neurologic: CN 2-12 grossly intact. Sensation intact, DTR normal. Strength 5/5 in all 4.  Psychiatric: Normal judgment and insight. Alert and oriented x 3. Normal mood  Data Reviewed:  Sodium is 142 potassium 3.4, chloride 104 CO2 28 glucose 126.  Magnesium 1.4 and alkaline phosphatase 146.  Albumin 3.0.  AST 34 with ALT 95 total 46.0.  Lactic acid 1.7.  Hemoglobin is 10.0 with platelets 565 normal white count.  BNP is 4465.  Chest x-ray showed bilateral infiltrates edema versus infection, x-ray of the right foot the left foot showed no acute findings.  EKG showed sinus tachycardia.  Assessment and Plan:  #1 new onset CHF: Point-of-care echo shows depressed EF in the ER.  Patient will be admitted with presumption of new cardiomyopathy following her ICU stay.  Probably causing systolic dysfunction.  We  will diurese her reasonably with potassium supplementation and magnesium supplementation.  Get echocardiogram in the morning.  Cardiology consult after echo.  #2 hypokalemia: Potassium is 2.4.  Will replete with IV potassium.  Replete magnesium as well.  #3 hypomagnesemia: We will replete magnesium and protein supplementation.  #4 bipolar disorder: Patient is only on Remeron from home.  We will continue.  #5 GERD: Continue with PPIs    Advance Care Planning:   Code Status: Prior DNR  Consults: Cardiology consult in the morning  Family Communication: No family at bedside  Severity of Illness: The appropriate patient status for this patient is INPATIENT. Inpatient status is judged to be reasonable and necessary in order to provide the required intensity of service to ensure the patient's safety. The patient's presenting symptoms,  physical exam findings, and initial radiographic and laboratory data in the context of their chronic comorbidities is felt to place them at high risk for further clinical deterioration. Furthermore, it is not anticipated that the patient will be medically stable for discharge from the hospital within 2 midnights of admission.   * I certify that at the point of admission it is my clinical judgment that the patient will require inpatient hospital care spanning beyond 2 midnights from the point of admission due to high intensity of service, high risk for further deterioration and high frequency of surveillance required.*  AuthorLonia Blood, MD 05/30/2022 9:01 PM  For on call review [email or web address].

## 2022-05-30 NOTE — ED Notes (Signed)
Pt assisted with a bedpan 

## 2022-05-30 NOTE — ED Provider Notes (Signed)
Jamie Shields   CSN: [id number] Arrival date & time: 05/30/22  1436     History No chief complaint on file.   HPI Jamie Shields is a 41 y.o. female presenting for bilateral lower extremity swelling and pain.  Her left lower extremity is floridly erythematous.  Both legs are diffusely swollen.  She has an extensive history and was recently admitted to the ICU for sepsis with multiorgan disease and multiple organ failure. Since she was discharged, she feels that she has had diffuse lower extremity swelling, fatigue, shortness of breath that is demonstrated and progressed over the past 24 hours.  She feels that she is not able to get up and walk around because she is too short of breath.  Denies fevers.  Denies nausea vomiting..   Patient's recorded medical, surgical, social, medication list and allergies were reviewed in the Snapshot window as part of the initial history.   Review of Systems   Review of Systems  Constitutional:  Negative for chills and fever.  HENT:  Negative for ear pain and sore throat.   Eyes:  Negative for pain and visual disturbance.  Respiratory:  Positive for shortness of breath. Negative for cough.   Cardiovascular:  Positive for leg swelling. Negative for chest pain and palpitations.  Gastrointestinal:  Negative for abdominal pain and vomiting.  Genitourinary:  Negative for dysuria and hematuria.  Musculoskeletal:  Negative for arthralgias and back pain.  Skin:  Negative for color change and rash.  Neurological:  Negative for seizures and syncope.  All other systems reviewed and are negative.   Physical Exam Updated Vital Signs BP (!) 152/114   Pulse (!) 112   Temp 98.2 F (36.8 C) (Oral)   Resp (!) 22   LMP  (LMP Unknown)   SpO2 100%  Physical Exam Vitals and nursing Shields reviewed.  Constitutional:      General: She is not in acute distress.    Appearance: She is well-developed.  HENT:      Head: Normocephalic and atraumatic.  Eyes:     Conjunctiva/sclera: Conjunctivae normal.  Cardiovascular:     Rate and Rhythm: Normal rate and regular rhythm.     Heart sounds: No murmur heard. Pulmonary:     Effort: No respiratory distress.     Breath sounds: Rales present.  Abdominal:     General: There is no distension.     Palpations: Abdomen is soft.     Tenderness: There is no abdominal tenderness. There is no right CVA tenderness or left CVA tenderness.  Musculoskeletal:        General: No swelling or tenderness. Normal range of motion.     Cervical back: Neck supple.  Skin:    General: Skin is warm and dry.  Neurological:     General: No focal deficit present.     Mental Status: She is alert and oriented to person, place, and time. Mental status is at baseline.     Cranial Nerves: No cranial nerve deficit.      ED Course/ Medical Decision Making/ A&P Clinical Course as of 05/30/22 1850  Sat May 30, 2022  1752 BUN: 6 [CC]  1804 B Natriuretic Peptide(!): 4,465.1 [CC]    Clinical Course User Index [CC] Jamie Sciara, MD    Procedures Procedures   Medications Ordered in ED Medications  potassium chloride 10 mEq in 100 mL IVPB (has no administration in time range)  cefTRIAXone (ROCEPHIN) 1 g in  sodium chloride 0.9 % 100 mL IVPB (1 g Intravenous New Bag/Given 05/30/22 1839)  ondansetron (ZOFRAN) injection 4 mg (has no administration in time range)  magnesium sulfate IVPB 2 g 50 mL (has no administration in time range)  potassium chloride SA (KLOR-CON M) CR tablet 40 mEq (40 mEq Oral Given 05/30/22 1819)  acetaminophen (TYLENOL) tablet 1,000 mg (1,000 mg Oral Given 05/30/22 1819)    Medical Decision Making:    [name] is a 41 y.o. female who presented to the ED today with severe lower extremity swelling, shortness of breath detailed above.     Patient's presentation is complicated by their history of multiple comorbid medical conditions including recent  ICU admission.  Patient placed on continuous vitals and telemetry monitoring while in ED which was reviewed periodically.   Complete initial physical exam performed, notably the patient  was with bilateral pitting edema complicated by overlying erythema and warmth.      Reviewed and confirmed nursing documentation for past medical history, family history, social history.    Initial Assessment:   With the patient's presentation of lower extremity swelling, most likely diagnosis is new diagnosis heart failure with complicating cellulitis of the edematous extremities. Other diagnoses were considered including (but not limited to) nephrotic syndrome, acute liver failure, sepsis, bilateral DVTs. These are considered less likely due to history of present illness and physical exam findings.   This is most consistent with an acute life/limb threatening illness complicated by underlying chronic conditions.  Initial Plan:  Screening labs including CBC and Metabolic panel to evaluate for infectious or metabolic etiology of disease.  Urinalysis with reflex culture ordered to evaluate for UTI or relevant urologic/nephrologic pathology.  XR to evaluate for structural/infectious intrathoracic pathology.  BNP/troponin/EKG to evaluate for cardiac pathology. Objective evaluation as below reviewed with plan for close reassessment  Initial Study Results:   Laboratory  Multiple acute laboratory abnormalities appreciated including hypokalemia, hypomagnesemia,  EKG EKG was reviewed independently. Rate, rhythm, axis, intervals all examined and without medically relevant abnormality. ST segments without concerns for elevations.    Radiology  All images reviewed independently. Agree with radiology report at this time.   DG Chest Portable 1 View  Result Date: 05/30/2022 CLINICAL DATA:  Shortness of breath in a 41 year old female. EXAM: PORTABLE CHEST 1 VIEW COMPARISON:  May 02, 2022. FINDINGS: EKG leads project  over the chest. Trachea is midline. Cardiomediastinal contours and hilar structures are stable and normal. Interval development of bibasilar airspace disease and small bilateral pleural effusions since previous imaging. Subtle increased interstitial markings with basilar predominance. No pneumothorax. On limited assessment no acute skeletal finding. IMPRESSION: Interval development of bibasilar airspace disease and small bilateral pleural effusions since previous imaging. Potential mild interstitial edema. Correlate with any signs of heart failure or volume overload though heart is not substantially enlarged. Electronically Signed   By: Donzetta Kohut M.D.   On: 05/30/2022 18:23   DG Foot Complete Right  Result Date: 05/30/2022 CLINICAL DATA:  Bilateral foot pain/swelling, infection EXAM: RIGHT FOOT COMPLETE - 3+ VIEW COMPARISON:  None Available. FINDINGS: No fracture or dislocation is seen. The joint spaces are preserved. Moderate dorsal soft tissue swelling along the forefoot. No osseous destruction to suggest osteomyelitis. IMPRESSION: Moderate dorsal soft tissue swelling along the forefoot. Otherwise negative. Electronically Signed   By: Charline Bills M.D.   On: 05/30/2022 17:02   DG Foot Complete Left  Result Date: 05/30/2022 CLINICAL DATA:  Bilateral foot pain/swelling, infection EXAM: LEFT FOOT -  COMPLETE 3+ VIEW COMPARISON:  None Available. FINDINGS: No fracture or dislocation is seen. The joint spaces are preserved. Severe dorsal soft tissue swelling along the midfoot. No osseous destruction to suggest osteomyelitis. IMPRESSION: Severe dorsal soft tissue swelling along the midfoot. Otherwise negative. Electronically Signed   By: Charline Bills M.D.   On: 05/30/2022 17:01     Final Assessment and Plan:   Patient's history of present illness and physical exam findings are most consistent with cellulitis.  Given severely elevated BNP and finding of lower extremity edema, patient's going to  require extensive medical evaluation.  Patient will need diuresis, unfortunately she has severe hypokalemia and hypomagnesemia at this time.  We will replace electrolytes prior to initiating diuresis and admit patient to medicine for ongoing care management.  Case discussed with hospitalist.   Disposition:   Based on the above findings, I believe this patient is stable for admission.    Patient/family educated about specific findings on our evaluation and explained exact reasons for admission.  Patient/family educated about clinical situation and time was allowed to answer questions.   Admission team communicated with and agreed with need for admission. Patient admitted. Patient ready to move at this time.     Emergency Department Medication Summary:   Medications  potassium chloride 10 mEq in 100 mL IVPB (has no administration in time range)  cefTRIAXone (ROCEPHIN) 1 g in sodium chloride 0.9 % 100 mL IVPB (1 g Intravenous New Bag/Given 05/30/22 1839)  ondansetron (ZOFRAN) injection 4 mg (has no administration in time range)  magnesium sulfate IVPB 2 g 50 mL (has no administration in time range)  potassium chloride SA (KLOR-CON M) CR tablet 40 mEq (40 mEq Oral Given 05/30/22 1819)  acetaminophen (TYLENOL) tablet 1,000 mg (1,000 mg Oral Given 05/30/22 1819)         Clinical Impression: No diagnosis found.   Admit   Final Clinical Impression(s) / ED Diagnoses Final diagnoses:  None    Rx / DC Orders ED Discharge Orders     None         Glyn Ade, MD 05/30/22 2351

## 2022-05-30 NOTE — ED Provider Triage Note (Signed)
Emergency Medicine Provider Triage Evaluation Note  Jamie Shields , a 41 y.o. female  was evaluated in triage.  Pt complains of bilateral leg swelling, redness, concern for infection, patient was recently just admitted to the hospital after hypovolemic shock, candidal esophagitis, and was admitted for several days, reports that she had a lower extremity swelling and was discharged on Lasix, has developed significant pain of bilateral lower extremities with redness, concern for infection, she was seen in urgent care yesterday and was found to be tachycardic and so encouraged to report to the emergency department, patient reports that she wanted to eat dinner and so she left and went home at that time but is back today for that evaluation.  She denies any chest pain, sensation of heart racing, nausea, vomiting.  Review of Systems  Positive: Leg swelling, pain, redness, tachycardia Negative: Chest pain, shob  Physical Exam  BP (!) 153/107 (BP Location: Right Arm)   Pulse (!) 116   Temp 98.2 F (36.8 C) (Oral)   Resp 17   LMP  (LMP Unknown)   SpO2 100%  Gen:   Awake, no distress   Resp:  Normal effort  MSK:   Moves extremities without difficulty  Other:  No swelling bilateral lower extremities with significant redness at the tops of the feet, left greater than right, concern for localized cellulitis, she is tachycardic with regular rhythm  Medical Decision Making  Medically screening exam initiated at 3:58 PM.  Appropriate orders placed.  Murielle L Kazee was informed that the remainder of the evaluation will be completed by another provider, this initial triage assessment does not replace that evaluation, and the importance of remaining in the ED until their evaluation is complete.  Workup initiated   Anselmo Pickler, [place] 05/30/22 1558

## 2022-05-30 NOTE — ED Triage Notes (Signed)
Patient here with ongoing lower extremity pain with swelling, redness and pain. Patient states recent admission to hospital with ongoing swelling.

## 2022-05-30 NOTE — ED Notes (Signed)
Pt placed on 2L [place]. Sats decrease to 88% on RA, pt reports no SOB or difficulty breathing, pulse ox assessment on different locations, confirmed pt 88% on RA, pt speaks in complete sentences. Sats 93% on 2L [place], afebrile, HR 117, will notified provider

## 2022-05-30 NOTE — ED Notes (Signed)
Jamie Reach, MD notified of pt placed on 2L [place] and HR 117, Dr. Jonelle Sidle verbalized to continue lasix as order, vitals are to be expected.

## 2022-05-31 ENCOUNTER — Inpatient Hospital Stay (HOSPITAL_COMMUNITY): Payer: Medicaid Other

## 2022-05-31 ENCOUNTER — Encounter (HOSPITAL_COMMUNITY): Payer: Self-pay | Admitting: Internal Medicine

## 2022-05-31 DIAGNOSIS — Z87898 Personal history of other specified conditions: Secondary | ICD-10-CM | POA: Diagnosis not present

## 2022-05-31 DIAGNOSIS — Z66 Do not resuscitate: Secondary | ICD-10-CM

## 2022-05-31 DIAGNOSIS — F319 Bipolar disorder, unspecified: Secondary | ICD-10-CM | POA: Diagnosis not present

## 2022-05-31 DIAGNOSIS — I351 Nonrheumatic aortic (valve) insufficiency: Secondary | ICD-10-CM

## 2022-05-31 DIAGNOSIS — E876 Hypokalemia: Secondary | ICD-10-CM | POA: Diagnosis not present

## 2022-05-31 DIAGNOSIS — E43 Unspecified severe protein-calorie malnutrition: Secondary | ICD-10-CM

## 2022-05-31 DIAGNOSIS — I5041 Acute combined systolic (congestive) and diastolic (congestive) heart failure: Secondary | ICD-10-CM | POA: Diagnosis not present

## 2022-05-31 LAB — ECHOCARDIOGRAM LIMITED
Area-P 1/2: 4.15 cm2
P 1/2 time: 300 msec
S' Lateral: 3.69 cm
Weight: 1851.86 oz

## 2022-05-31 LAB — BASIC METABOLIC PANEL
Anion gap: 9 (ref 5–15)
BUN: 6 mg/dL (ref 6–20)
CO2: 25 mmol/L (ref 22–32)
Calcium: 8.1 mg/dL — ABNORMAL LOW (ref 8.9–10.3)
Chloride: 102 mmol/L (ref 98–111)
Creatinine, Ser: 0.67 mg/dL (ref 0.44–1.00)
GFR, Estimated: >60 mL/min (ref >60)
Glucose, Bld: 126 mg/dL — ABNORMAL HIGH (ref 70–99)
Potassium: 2.7 mmol/L — CL (ref 3.5–5.1)
Sodium: 136 mmol/L (ref 135–145)

## 2022-05-31 LAB — CBC
HCT: 32.5 % — ABNORMAL LOW (ref 36.0–46.0)
Hemoglobin: 9.9 g/dL — ABNORMAL LOW (ref 12.0–15.0)
MCH: 31 pg (ref 26.0–34.0)
MCHC: 30.5 g/dL (ref 30.0–36.0)
MCV: 101.9 fL — ABNORMAL HIGH (ref 80.0–100.0)
Platelets: 601 10*3/uL — ABNORMAL HIGH (ref 150–400)
RBC: 3.19 MIL/uL — ABNORMAL LOW (ref 3.87–5.11)
RDW: 15.5 % (ref 11.5–15.5)
WBC: 8.3 10*3/uL (ref 4.0–10.5)
nRBC: 0 % (ref 0.0–0.2)

## 2022-05-31 MED ORDER — THIAMINE HCL 100 MG/ML IJ SOLN
250.0000 mg | Freq: Two times a day (BID) | INTRAVENOUS | Status: DC
  Administered 2022-05-31 – 2022-06-03 (×6): 250 mg via INTRAVENOUS
  Filled 2022-05-31 (×9): qty 2.5

## 2022-05-31 MED ORDER — MAGNESIUM SULFATE 4 GM/100ML IV SOLN
4.0000 g | Freq: Once | INTRAVENOUS | Status: AC
  Administered 2022-05-31: 4 g via INTRAVENOUS
  Filled 2022-05-31: qty 100

## 2022-05-31 MED ORDER — ADULT MULTIVITAMIN W/MINERALS CH
1.0000 | ORAL_TABLET | Freq: Every day | ORAL | Status: DC
  Administered 2022-05-31 – 2022-06-04 (×5): 1 via ORAL
  Filled 2022-05-31 (×5): qty 1

## 2022-05-31 MED ORDER — FOLIC ACID 1 MG PO TABS
1.0000 mg | ORAL_TABLET | Freq: Every day | ORAL | Status: DC
  Administered 2022-05-31 – 2022-06-04 (×5): 1 mg via ORAL
  Filled 2022-05-31 (×5): qty 1

## 2022-05-31 MED ORDER — SPIRONOLACTONE 12.5 MG HALF TABLET
12.5000 mg | ORAL_TABLET | Freq: Every day | ORAL | Status: DC
  Administered 2022-05-31 – 2022-06-01 (×2): 12.5 mg via ORAL
  Filled 2022-05-31 (×2): qty 1

## 2022-05-31 MED ORDER — SODIUM CHLORIDE 0.9 % IV SOLN: 250.0000 mL | INTRAVENOUS | Status: DC | PRN

## 2022-05-31 MED ORDER — VITAMIN C 500 MG PO TABS
500.0000 mg | ORAL_TABLET | Freq: Every day | ORAL | Status: DC
  Administered 2022-05-31 – 2022-06-04 (×5): 500 mg via ORAL
  Filled 2022-05-31 (×5): qty 1

## 2022-05-31 MED ORDER — LORAZEPAM 0.5 MG PO TABS
0.5000 mg | ORAL_TABLET | ORAL | Status: DC | PRN
  Administered 2022-05-31 – 2022-06-04 (×8): 0.5 mg via ORAL
  Filled 2022-05-31 (×8): qty 1

## 2022-05-31 MED ORDER — PANTOPRAZOLE SODIUM 40 MG PO TBEC
40.0000 mg | DELAYED_RELEASE_TABLET | Freq: Every day | ORAL | Status: DC
  Administered 2022-05-31 – 2022-06-04 (×5): 40 mg via ORAL
  Filled 2022-05-31 (×5): qty 1

## 2022-05-31 MED ORDER — CARVEDILOL 3.125 MG PO TABS
3.1250 mg | ORAL_TABLET | Freq: Two times a day (BID) | ORAL | Status: DC
  Administered 2022-05-31: 3.125 mg via ORAL
  Filled 2022-05-31: qty 1

## 2022-05-31 MED ORDER — SODIUM CHLORIDE 0.9% FLUSH
3.0000 mL | Freq: Two times a day (BID) | INTRAVENOUS | Status: DC
  Administered 2022-05-31 – 2022-06-01 (×2): 3 mL via INTRAVENOUS

## 2022-05-31 MED ORDER — POTASSIUM CHLORIDE CRYS ER 20 MEQ PO TBCR
40.0000 meq | EXTENDED_RELEASE_TABLET | ORAL | Status: AC
  Administered 2022-05-31 (×2): 40 meq via ORAL
  Filled 2022-05-31 (×2): qty 2

## 2022-05-31 MED ORDER — FUROSEMIDE 10 MG/ML IJ SOLN
60.0000 mg | Freq: Two times a day (BID) | INTRAMUSCULAR | Status: DC
  Administered 2022-05-31 – 2022-06-01 (×2): 60 mg via INTRAVENOUS
  Filled 2022-05-31 (×2): qty 6

## 2022-05-31 MED ORDER — SODIUM CHLORIDE 0.9% FLUSH: 3.0000 mL | INTRAVENOUS | Status: DC | PRN

## 2022-05-31 MED ORDER — LEVETIRACETAM 500 MG PO TABS
500.0000 mg | ORAL_TABLET | Freq: Two times a day (BID) | ORAL | Status: DC
  Administered 2022-05-31 – 2022-06-04 (×9): 500 mg via ORAL
  Filled 2022-05-31 (×9): qty 1

## 2022-05-31 MED ORDER — HYDROXYZINE HCL 10 MG PO TABS
10.0000 mg | ORAL_TABLET | Freq: Three times a day (TID) | ORAL | Status: DC | PRN
  Administered 2022-06-01 – 2022-06-02 (×4): 10 mg via ORAL
  Filled 2022-05-31 (×4): qty 1

## 2022-05-31 MED ORDER — ENOXAPARIN SODIUM 40 MG/0.4ML IJ SOSY
40.0000 mg | PREFILLED_SYRINGE | Freq: Every day | INTRAMUSCULAR | Status: DC
  Administered 2022-05-31: 40 mg via SUBCUTANEOUS
  Filled 2022-05-31: qty 0.4

## 2022-05-31 MED ORDER — VITAMIN D 25 MCG (1000 UNIT) PO TABS
1000.0000 [IU] | ORAL_TABLET | Freq: Every day | ORAL | Status: DC
  Administered 2022-05-31 – 2022-06-04 (×5): 1000 [IU] via ORAL
  Filled 2022-05-31 (×5): qty 1

## 2022-05-31 MED ORDER — VITAMIN A 3 MG (10000 UNIT) PO CAPS
10000.0000 [IU] | ORAL_CAPSULE | Freq: Every day | ORAL | Status: DC
  Administered 2022-05-31 – 2022-06-04 (×4): 10000 [IU] via ORAL
  Filled 2022-05-31 (×5): qty 1

## 2022-05-31 MED ORDER — MAGNESIUM OXIDE -MG SUPPLEMENT 400 (240 MG) MG PO TABS
400.0000 mg | ORAL_TABLET | Freq: Every day | ORAL | Status: DC
  Administered 2022-05-31: 400 mg via ORAL
  Filled 2022-05-31: qty 1

## 2022-05-31 MED ORDER — THIAMINE HCL 100 MG/ML IJ SOLN: 250.0000 mg | Freq: Two times a day (BID) | INTRAVENOUS | Status: DC

## 2022-05-31 NOTE — Assessment & Plan Note (Signed)
Continue vitamin supplementation vitamin C, vitamin A and folic acid Will add high dose thiamine, possible wet Beriberi. Check thiamine level.

## 2022-05-31 NOTE — Assessment & Plan Note (Signed)
Continue with as needed lorazepam and hydroxyzine On scheduled mirtazapine,

## 2022-05-31 NOTE — Assessment & Plan Note (Signed)
Resume Keppra 500 mg bid per her home regimen No active seizures.

## 2022-05-31 NOTE — Progress Notes (Signed)
  Echocardiogram 2D Echocardiogram limited has been performed.  [name] M 05/31/2022, 1:50 PM

## 2022-05-31 NOTE — Progress Notes (Signed)
Progress Note   Patient: KRYSTALLE PILKINGTON ZOX:[id number] DOB: 22-Sep-1980 DOA: 05/30/2022     1 DOS: the patient was seen and examined on 05/31/2022   Brief hospital course: Mrs. Noguchi was admitted to the hospital with the working diagnosis of decompensated heart failure.   41 yo female with the past medica history of bipolar, substance abuse, chronic bronchitis, seizures who presented with dyspnea.  Recent hospitalization 10/06 to 05/14/22 for hypovolemic shock treated with volume resuscitation, vasopressors, midodrine and stress dose systemic corticosteroids.  Reported worsening lower extremity for few weeks, associated with dyspnea on exertion. Patient was evaluated at a urgent care clinic and referred to the ED for further evaluation, On her initial physical examination her blood pressure was 144/99, HR 104, RR 24 and 02 saturation 98%, lungs with coarse breath sounds with rales bilaterally, heart with S1 and S2 present and rhythmic, abdomen with no distention, positive lower extremity edema.  Na 142, K 2,4 CL 104 bicarbonate 28, glucose 126 bun 6 cr 0,70 Mg 1,4  AST 34, ALT 95,  BNP 4,465 Wbc 7,0 hgb 10,0 plt 565   Chest radiograph with no cardiomegaly, bilateral hilar vascular congestion with bilateral small pleural effusion, cephalization of the vasculature.  EKG 109 bpn, right axis deviation, normal intervals, sinus rhythm with poor R R wave progression with no significant ST segment or T wave changes. Positive LVH.   Patient was placed on furosemide for diuresis.       Assessment and Plan: * Acute combined systolic and diastolic CHF, NYHA class 1 (HCC) Echocardiogram from 05/03/22 with preserved LV systolic function, EF 70 to 75%, hyperdynamic RV with preserved systolic function, RVSP 81,1 mmHg, possible aortic insufficiency.  Old records personally reviewed Cardiology evaluation on 10/10, concluded possible mild to moderate AI and recommended follow up echocardiogram as outpatient  after recovering of acute illness.   Urine output is documented at 914 cc Systolic blood pressure is 120 to 130 mmHg.   Will increase dose of furosemide to 60 mg IV q12 Add spironolactone to augment diuresis, no SGLT 2 inh due to recent urinary tract infection.  Follow up on limited echocardiogram, assess aortic valve disease.  Add Unna boots.   Hypokalemia Hypomagnesemia  Renal function with serum cr at 0,67, K is 2,7 and serum bicarbonate at 25 Mag 1,4.  Plan to continue K correction with Kcl 80 meq today in 2 divided doses and add 4 g mag sulfate Continue diuresis and follow up renal function and electrolytes in am.   History of seizures Resume Keppra 500 mg bid per her home regimen No active seizures.   Bipolar disease, chronic ([place]) Continue with as needed lorazepam and hydroxyzine On scheduled mirtazapine,   Severe protein-calorie malnutrition (HCC) Continue vitamin supplementation vitamin C, vitamin A and folic acid Will add high dose thiamine, possible wet Beriberi. Check thiamine level.         Subjective: Patient continue to have dyspnea and lower extremity edema, no chest pain, no diarrhea.   Physical Exam: Vitals:   05/31/22 0620 05/31/22 0621 05/31/22 0800 05/31/22 1200  BP: 134/89  (!) 127/95 (!) 127/92  Pulse: (!) 112  (!) 109 99  Resp: 20     Temp: 98 F (36.7 C)     TempSrc: Oral  Oral   SpO2:  92% 100% 95%  Weight:       Neurology awake and alert ENT with mild pallor Cardiovascular with S1 and S2 present and rhythmic with no gallops or  rubs, no murmurs Respiratory with no wheezing, positive rales at bases with no rhonchi Abdomen with no distention  Positive lower extremity edema pitting +++  Data Reviewed:    Family Communication: no family at the bedside   Disposition: Status is: Inpatient Remains inpatient appropriate because: heart failure   Planned Discharge Destination: Home    Author: Coralie Keens, MD 05/31/2022  1:08 PM  For on call review [email or web address].

## 2022-05-31 NOTE — Assessment & Plan Note (Addendum)
Echocardiogram with reduced LV systolic function 30 to 32%, with global hypokinesis. Mild reduction in RV systolic function, mild enlarged RV cavity, RVSP 42.2 mmHg, small pericardial effusion, aortic valve with moderate aortic regurgitation,   Urine output 3,850 ml over last 24 hrs Systolic blood pressure 549 to 114 mmHg.   Continue diuresis with furosemide, spironolactone and empagliflozin.  Added digoxin.  Plan for cardiac catheterization and TEE for further cardiac work up.   Acute hypoxemic respiratory failure due to acute cardiogenic pulmonary edema Oxygenation has improved with diuresis  Currently oxygenation is 100% on room air (respiratory failure has resolved).

## 2022-05-31 NOTE — Assessment & Plan Note (Signed)
Hypomagnesemia  Volume status has improved but not back to baseline. Renal function with serum cr at 0,86, K is 4,0 and serum bicarbonate at 30. Mag 1,5.   Plan to continue diuresis, add 40 Klc and 4 g mag sulfate. Follow up renal function in am.

## 2022-05-31 NOTE — Hospital Course (Addendum)
Mrs. Jamie Shields was admitted to the hospital with the working diagnosis of decompensated heart failure.   41 yo female with the past medica history of bipolar, substance abuse, chronic bronchitis, seizures who presented with dyspnea.  Recent hospitalization 10/06 to 05/14/22 for hypovolemic shock treated with volume resuscitation, vasopressors, midodrine and stress dose systemic corticosteroids.  Reported worsening lower extremity for few weeks, associated with dyspnea on exertion. Patient was evaluated at a urgent care clinic and referred to the ED for further evaluation, On her initial physical examination her blood pressure was 144/99, HR 104, RR 24 and 02 saturation 98%, lungs with coarse breath sounds with rales bilaterally, heart with S1 and S2 present and rhythmic, abdomen with no distention, positive lower extremity edema.  Na 142, K 2,4 CL 104 bicarbonate 28, glucose 126 bun 6 cr 0,70 Mg 1,4  AST 34, ALT 95,  BNP 4,465 Wbc 7,0 hgb 10,0 plt 565  Urine analysis with SG 1,006, negative for protein, negative for leukocytes.   Chest radiograph with no cardiomegaly, bilateral hilar vascular congestion with bilateral small pleural effusion, cephalization of the vasculature.  EKG 109 bpn, right axis deviation, normal intervals, sinus rhythm with poor R R wave progression with no significant ST segment or T wave changes. Positive LVH.   Patient was placed on furosemide for diuresis.   11/06 continue with hypervolemia. Echocardiogram with significant reduction in LV systolic function. Will continue aggressive diuresis, patient likely will need cardiac catheterization as part of the workup. Will consult cardiology.  11/07 patient for right and left heart catheterization and TEE.

## 2022-06-01 ENCOUNTER — Encounter (HOSPITAL_COMMUNITY): Payer: Self-pay | Admitting: Internal Medicine

## 2022-06-01 DIAGNOSIS — Z87898 Personal history of other specified conditions: Secondary | ICD-10-CM | POA: Diagnosis not present

## 2022-06-01 DIAGNOSIS — I5041 Acute combined systolic (congestive) and diastolic (congestive) heart failure: Secondary | ICD-10-CM | POA: Diagnosis not present

## 2022-06-01 DIAGNOSIS — F319 Bipolar disorder, unspecified: Secondary | ICD-10-CM | POA: Diagnosis not present

## 2022-06-01 DIAGNOSIS — E876 Hypokalemia: Secondary | ICD-10-CM | POA: Diagnosis not present

## 2022-06-01 LAB — BASIC METABOLIC PANEL
Anion gap: 9 (ref 5–15)
BUN: 10 mg/dL (ref 6–20)
CO2: 25 mmol/L (ref 22–32)
Calcium: 8.4 mg/dL — ABNORMAL LOW (ref 8.9–10.3)
Chloride: 104 mmol/L (ref 98–111)
Creatinine, Ser: 1.13 mg/dL — ABNORMAL HIGH (ref 0.44–1.00)
GFR, Estimated: >60 mL/min (ref >60)
Glucose, Bld: 110 mg/dL — ABNORMAL HIGH (ref 70–99)
Potassium: 3.4 mmol/L — ABNORMAL LOW (ref 3.5–5.1)
Sodium: 138 mmol/L (ref 135–145)

## 2022-06-01 LAB — MAGNESIUM: Magnesium: 2.2 mg/dL (ref 1.7–2.4)

## 2022-06-01 MED ORDER — SODIUM CHLORIDE 0.9% FLUSH: 10.0000 mL | INTRAVENOUS | Status: DC | PRN

## 2022-06-01 MED ORDER — CHLORHEXIDINE GLUCONATE CLOTH 2 % EX PADS
6.0000 | MEDICATED_PAD | Freq: Every day | CUTANEOUS | Status: DC
  Administered 2022-06-01 – 2022-06-02 (×2): 6 via TOPICAL

## 2022-06-01 MED ORDER — SODIUM CHLORIDE 0.9% FLUSH
10.0000 mL | Freq: Two times a day (BID) | INTRAVENOUS | Status: DC
  Administered 2022-06-01 – 2022-06-03 (×4): 10 mL

## 2022-06-01 MED ORDER — SODIUM CHLORIDE 0.9 % IV SOLN: INTRAVENOUS | Status: DC

## 2022-06-01 MED ORDER — ASPIRIN 81 MG PO CHEW
81.0000 mg | CHEWABLE_TABLET | ORAL | Status: AC
  Administered 2022-06-02: 81 mg via ORAL
  Filled 2022-06-01: qty 1

## 2022-06-01 MED ORDER — SODIUM CHLORIDE 0.9% FLUSH
3.0000 mL | Freq: Two times a day (BID) | INTRAVENOUS | Status: DC
  Administered 2022-06-01: 3 mL via INTRAVENOUS

## 2022-06-01 MED ORDER — FUROSEMIDE 10 MG/ML IJ SOLN
80.0000 mg | Freq: Two times a day (BID) | INTRAMUSCULAR | Status: DC
  Administered 2022-06-01: 80 mg via INTRAVENOUS
  Filled 2022-06-01: qty 8

## 2022-06-01 MED ORDER — SODIUM CHLORIDE 0.9 % IV SOLN: 250.0000 mL | INTRAVENOUS | Status: DC | PRN

## 2022-06-01 MED ORDER — POTASSIUM CHLORIDE CRYS ER 20 MEQ PO TBCR
40.0000 meq | EXTENDED_RELEASE_TABLET | ORAL | Status: DC
  Administered 2022-06-01: 40 meq via ORAL
  Filled 2022-06-01: qty 2

## 2022-06-01 MED ORDER — SODIUM CHLORIDE 0.9% FLUSH: 3.0000 mL | INTRAVENOUS | Status: DC | PRN

## 2022-06-01 MED ORDER — SPIRONOLACTONE 25 MG PO TABS
25.0000 mg | ORAL_TABLET | Freq: Every day | ORAL | Status: DC
  Administered 2022-06-02 – 2022-06-04 (×3): 25 mg via ORAL
  Filled 2022-06-01 (×3): qty 1

## 2022-06-01 MED ORDER — POTASSIUM CHLORIDE CRYS ER 20 MEQ PO TBCR
40.0000 meq | EXTENDED_RELEASE_TABLET | Freq: Once | ORAL | Status: AC
  Administered 2022-06-01: 40 meq via ORAL
  Filled 2022-06-01: qty 2

## 2022-06-01 NOTE — Progress Notes (Signed)
PT Cancellation Note  Patient Details Name: Jamie Shields MRN: [id number] DOB: 1980/12/12   Cancelled Treatment:    Reason Eval/Treat Not Completed: Other (comment) (pt received lunch and request defer mobility until after eating)   Reo Portela B Porshea Janowski 06/01/2022, 12:28 PM Bayard Males, [place] Office: [phone]

## 2022-06-01 NOTE — Progress Notes (Signed)
Orthopedic Tech Progress Note Patient Details:  Jamie Shields 07-Nov-1980 [id number]  Ortho Devices Type of Ortho Device: Haematologist Ortho Device/Splint Location: BLE Ortho Device/Splint Interventions: Ordered, Application, Adjustment   Post Interventions Patient Tolerated: Well Instructions Provided: Care of device  [name] 06/01/2022, 5:19 PM

## 2022-06-01 NOTE — Evaluation (Signed)
Physical Therapy Evaluation Patient Details Name: Jamie Shields MRN: [id number] DOB: 1981-02-24 Today's Date: 06/01/2022  History of Present Illness  41 yo female admitted 11/3 with bil LE edema, DOE and decompensated CHF. PMHx:malnourished, bipolar, substance abuse, bronchitis, Sz  Clinical Impression  Pt pleasant and reports not being able to significantly walk at home since last admission due to increasing pain and edema bil LE. Pt has been moving around by placing hands on bil sides of hallway and hopping at home (camper) as RW will not fit in home. Pt states feet are numb at rest but painful in standing. Discussed with pt need for proper footwear to protect feet with edema and decreased sensation as well as walking program as able. Discussed heart healthy cooking and limited sodium with pt verbalizing understanding. Pt with decreased activity tolerance and mobility with encouragement to walk to bathroom and progress as tolerance allows. Pt will benefit from acute therapy to maximize mobility and safety.      Recommendations for follow up therapy are one component of a multi-disciplinary discharge planning process, led by the attending physician.  Recommendations may be updated based on patient status, additional functional criteria and insurance authorization.  Follow Up Recommendations Outpatient PT      Assistance Recommended at Discharge Intermittent Supervision/Assistance  Patient can return home with the following  A little help with walking and/or transfers;A little help with bathing/dressing/bathroom;Assistance with cooking/housework;Assist for transportation;Help with stairs or ramp for entrance    Equipment Recommendations Rolling walker (2 wheels) (pt declined ordering at this time as it will not fit in camper)  Recommendations for Other Services       Functional Status Assessment Patient has had a recent decline in their functional status and/or demonstrates limited ability  to make significant improvements in function in a reasonable and predictable amount of time     Precautions / Restrictions Precautions Precautions: Fall Precaution Comments: bil LE edema, decreased sensation      Mobility  Bed Mobility Overal bed mobility: Modified Independent Bed Mobility: Supine to Sit, Sit to Supine     Supine to sit: HOB elevated Sit to supine: HOB elevated   General bed mobility comments: HOB 40 degrees with pt able to transfer to and from bed without assist    Transfers Overall transfer level: Needs assistance Equipment used: Rolling walker (2 wheels) Transfers: Sit to/from Stand Sit to Stand: Supervision           General transfer comment: supervision for safety with RW present    Ambulation/Gait Ambulation/Gait assistance: Min guard Gait Distance (Feet): 24 Feet Assistive device: Rolling walker (2 wheels) Gait Pattern/deviations: Step-through pattern, Decreased stride length, Antalgic   Gait velocity interpretation: <1.31 ft/sec, indicative of household ambulator   General Gait Details: cues for sequence with pt reliant on RW to offweight bil LE with increased pain LLE>RLE, limited distance due to pain  Stairs            Wheelchair Mobility    Modified Rankin (Stroke Patients Only)       Balance Overall balance assessment: Mild deficits observed, not formally tested   Sitting balance-Leahy Scale: Good     Standing balance support: Bilateral upper extremity supported Standing balance-Leahy Scale: Poor Standing balance comment: bil UE support on RW due to pain                             Pertinent Vitals/Pain Pain Assessment Pain  Score: 7  Pain Location: bil LE Pain Descriptors / Indicators: Tightness, Sore Pain Intervention(s): Limited activity within patient's tolerance, Monitored during session, Patient requesting pain meds-RN notified    Home Living Family/patient expects to be discharged to:: Private  residence Living Arrangements: Spouse/significant other Available Help at Discharge: Family;Available 24 hours/day Type of Home: Other(Comment) (camper) Home Access: Stairs to enter   Entrance Stairs-Number of Steps: 2-3   Home Layout: One level Home Equipment: Grab bars - tub/shower;Shower seat - built in;Cane - single point Additional Comments: lives in a camper, works at a [employer]. Rw will not fit in camper but cane will    Prior Function Prior Level of Function : Independent/Modified Independent;Working/employed                     Hand Dominance        Extremity/Trunk Assessment   Upper Extremity Assessment Upper Extremity Assessment: Overall WFL for tasks assessed    Lower Extremity Assessment Lower Extremity Assessment: Overall WFL for tasks assessed (bil LE edema limiting ROM and pain)    Cervical / Trunk Assessment Cervical / Trunk Assessment: Normal  Communication   Communication: No difficulties  Cognition Arousal/Alertness: Awake/alert Behavior During Therapy: WFL for tasks assessed/performed Overall Cognitive Status: Within Functional Limits for tasks assessed                                          General Comments      Exercises     Assessment/Plan    PT Assessment Patient needs continued PT services  PT Problem List Decreased mobility;Decreased activity tolerance;Decreased knowledge of use of DME;Pain       PT Treatment Interventions DME instruction;Gait training;Stair training;Functional mobility training;Therapeutic activities;Therapeutic exercise;Balance training;Neuromuscular re-education;Patient/family education    PT Goals (Current goals can be found in the Care Plan section)  Acute Rehab PT Goals Patient Stated Goal: Get well, go home and back to work PT Goal Formulation: With patient/family Time For Goal Achievement: 06/15/22 Potential to Achieve Goals: Good    Frequency Min 2X/week      Co-evaluation               AM-PAC PT "6 Clicks" Mobility  Outcome Measure Help needed turning from your back to your side while in a flat bed without using bedrails?: None Help needed moving from lying on your back to sitting on the side of a flat bed without using bedrails?: None Help needed moving to and from a bed to a chair (including a wheelchair)?: None Help needed standing up from a chair using your arms (e.g., wheelchair or bedside chair)?: A Little Help needed to walk in hospital room?: A Little Help needed climbing 3-5 steps with a railing? : A Little 6 Click Score: 21    End of Session   Activity Tolerance: Patient limited by pain Patient left: in bed;with call bell/phone within reach;with family/visitor present Nurse Communication: Mobility status PT Visit Diagnosis: Other abnormalities of gait and mobility (R26.89);Pain Pain - Right/Left: Left Pain - part of body: Ankle and joints of foot    Time: 4696-2952 PT Time Calculation (min) (ACUTE ONLY): 27 min   Charges:   PT Evaluation $PT Eval Moderate Complexity: 1 Mod          Kevin, PT Acute Rehabilitation Services Office: [phone]   Sandy Salaam Dameian Crisman 06/01/2022, 1:31 PM

## 2022-06-01 NOTE — Progress Notes (Signed)
Heart Failure Navigator Progress Note  Assessed for Heart & Vascular TOC clinic readiness.  Patient does not meet criteria due to Advanced Heart Failure Team Consulted.Earnestine Leys, BSN, RN Heart Failure Transport planner Only

## 2022-06-01 NOTE — Consult Note (Addendum)
Advanced Heart Failure Team Consult Note   Primary Physician: Patient, No Pcp Per PCP-Cardiologist:  None HF cardiologist: Golden Circle, MD  Reason for Consultation: acute combined systolic and diastolic CHF  HPI:    Jamie Shields is seen today for evaluation of acute combined systolic and diastolic CHF at the request of Dr. Ella Jubilee, internal medicine.   Ms. Roberson is a 41 y.o. female with PMH of substance abuse, chronic bronchitis, hypovolemic shock, protein calorie malnutrition, seizures, bipolar disorder, and substance abuse. She presented to the ED with complaints of dyspnea.   She was recently hospitalized 10/06-10/19 for hypovolemic shock requiring IVF, pressors, midodrine and stress dose corticosteroids.   For the last couple of weeks she has had worsening BLE edema and DOE. Was discharged with some swelling in BLE but has progressively worsened and started to hurt. In the ED she was tachycardic, slightly hypertensive (144/99), BNP 4465, K 2.4, Cr. .7. CXR: bilateral hilar vascular congestion with bilateral small pleural effusion. EKG right axis deviation, NSR 109bpm , LVH. Diuresed with IV lasix.   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 After d/c she has had some SOB but it hasn't been too bad, her fiance is the one that noticed it and asked her to come to ED. Denies any history or active use of drugs. Occasionally vapes, quit smoking cigarettes 20'. Used to drink 1 pack of Budlight a day up until 6 months ago, has since quit. Strong cardiac family history. Dad has had MI, mom with MI and HTN, maternal grandfather had an aneurysm and multiple MIs, and paternal grandfather had CABGx3 and multiple MIs. No history in siblings or children. Sleeps on a couple of pillows. BLE edema has progressively gotten worst and is now painful and erythematous. Does admit to high volume intake, drinking ~12+ cups of anything a day.  Complaint with all meds post d/c.  Denies CP and currently no SOB.  Echo  05/31/22: EF 30-35%, LV with global hypokinesis, RV mildly enlarged, small to mod pericardial effusion, mild MR, mild to mod TR, Mod AV regurgitation. Pulmonic valve regurgitation Mod.  05/03/22: EF 70-75%, no MR, mild TR, suspicious for severe aortic insufficiency. Severe aortic regurgitation    Home Medications Prior to Admission medications   Medication Sig Start Date End Date Taking? Authorizing Provider  ascorbic acid (VITAMIN C) 250 MG tablet Take 1 tablet (250 mg total) by mouth 2 (two) times daily. 05/14/22 08/12/22 Yes Dahal, Melina Schools, MD  cholecalciferol (CHOLECALCIFEROL) 25 MCG tablet Take 1 tablet (1,000 Units total) by mouth daily. 05/15/22 08/13/22 Yes Dahal, Melina Schools, MD  folic acid (FOLVITE) 1 MG tablet Take 1 tablet (1 mg total) by mouth daily. 05/15/22 08/13/22 Yes Dahal, Melina Schools, MD  hydrOXYzine (ATARAX) 10 MG tablet Take 1 tablet (10 mg total) by mouth 3 (three) times daily as needed for anxiety. 05/14/22 06/13/22 Yes Dahal, Melina Schools, MD  mirtazapine (REMERON) 15 MG tablet Take 1 tablet (15 mg total) by mouth at bedtime. 05/14/22 06/13/22 Yes Dahal, Melina Schools, MD  Multiple Vitamins-Minerals (MULTIVITAMIN WITH MINERALS) tablet Take 1 tablet by mouth daily. 05/14/22 08/12/22 Yes Dahal, Melina Schools, MD  pantoprazole (PROTONIX) 40 MG tablet Take 1 tablet (40 mg total) by mouth daily. 05/15/22 08/13/22 Yes Dahal, Melina Schools, MD  vitamin A 3 MG (10000 UNITS) capsule Take 1 capsule (10,000 Units total) by mouth daily. 05/15/22 08/13/22 Yes Dahal, Melina Schools, MD  furosemide (LASIX) 20 MG tablet Take  1 tablet (20 mg total) by mouth daily for 7 days. Patient not taking: Reported on 05/29/2022 05/14/22 05/21/22  Lorin Glass, MD    Past Medical History: Past Medical History:  Diagnosis Date   Anxiety    Bipolar 1 disorder (HCC)    Bursitis of hip    Chronic bronchitis    Hip joint pain    Seizures (HCC)    Shock (HCC)     hypovolemic    Past Surgical History: Past Surgical History:  Procedure Laterality Date   BIOPSY  05/07/2022   Procedure: BIOPSY;  Surgeon: Benancio Deeds, MD;  Location: [facility] ENDOSCOPY;  Service: Gastroenterology;;   CESAREAN SECTION     2002   ESOPHAGOGASTRODUODENOSCOPY (EGD) WITH PROPOFOL N/A 05/07/2022   Procedure: ESOPHAGOGASTRODUODENOSCOPY (EGD) WITH PROPOFOL;  Surgeon: Benancio Deeds, MD;  Location: [facility] ENDOSCOPY;  Service: Gastroenterology;  Laterality: N/A;   KNEE ARTHROSCOPY     MOUTH SURGERY  2010    Family History: Family History  Problem Relation Age of Onset   Heart attack Mother    Hypertension Mother    Heart attack Father    Heart attack Maternal Grandfather    Heart attack Paternal Grandfather    Diabetes Other    Cancer Other     Social History: Social History   Socioeconomic History   Marital status: Legally Separated    Spouse name: Not on file   Number of children: Not on file   Years of education: Not on file   Highest education level: Not on file  Occupational History   Not on file  Tobacco Use   Smoking status: Former    Packs/day: 0.50    Types: Cigarettes   Smokeless tobacco: Never  Vaping Use   Vaping Use: Some days  Substance and Sexual Activity   Alcohol use: Yes    Comment: rarely   Drug use: No   Sexual activity: Never    Birth control/protection: None  Other Topics Concern   Not on file  Social History Narrative   Not on file   Social Determinants of Health   Financial Resource Strain: Not on file  Food Insecurity: Not on file  Transportation Needs: Not on file  Physical Activity: Not on file  Stress: Not on file  Social Connections: Not on file    Allergies:  Allergies  Allergen Reactions   Cucumber Extract Anaphylaxis   Keflex [Cephalexin] Nausea And Vomiting   Macrobid [Nitrofurantoin Monohydrate Macrocrystals] Hives and Swelling   Naproxen Nausea And Vomiting   Penicillins Nausea And Vomiting     Heavy vomitting Did PCN reaction causing immediate rash, facial/tongue/throat swelling, SOB or lightheadedness with hypotension: No swelling, but im not sure if i got a rash as i was red all over already Did PCN reaction causing severe rash involving mucus membranes or skin necrosis: No Did a PCN reaction that required hospitalization Yes went to the ED Has patient had a PCN reaction occurring within the last 10 years: No-childhood allergy If all of the above answers are "NO", then may proceed with   Tramadol Nausea And Vomiting   Watermelon Concentrate Diarrhea   Darvocet [Propoxyphene N-Acetaminophen] Nausea And Vomiting and Other (See Comments)    migraines   Flagyl [Metronidazole Hcl] Hives, Swelling and Rash   Ketorolac Nausea And Vomiting and Other (See Comments)    migraines   Ultram [Tramadol Hcl] Nausea And Vomiting and Other (See Comments)    migraines   Vicodin [Hydrocodone-Acetaminophen] Nausea  And Vomiting and Other (See Comments)    migraines    Objective:    Vital Signs:   Temp:  [97.1 F (36.2 C)-98.4 F (36.9 C)] 97.6 F (36.4 C) (11/06 1111) Pulse Rate:  [99-106] 99 (11/06 1111) Resp:  [18-20] 18 (11/06 1111) BP: (109-122)/(80-94) 120/87 (11/06 1111) SpO2:  [97 %-100 %] 100 % (11/06 1111) Weight:  [52.3 kg] 52.3 kg (11/06 0100) Last BM Date : 05/30/22  Weight change: Filed Weights   05/31/22 0234 06/01/22 0100  Weight: 52.5 kg 52.3 kg    Intake/Output:   Intake/Output Summary (Last 24 hours) at 06/01/2022 1219 Last data filed at 06/01/2022 1200 Gross per 24 hour  Intake 1293.51 ml  Output 4900 ml  Net -3606.49 ml    Physical Exam   General:  cachectic appearing.  No respiratory difficulty HEENT: normal Neck: supple. JVD ~12 cm. Carotids 2+ bilat; no bruits. No lymphadenopathy or thyromegaly appreciated. Cor: PMI nondisplaced. Regular rate & rhythm. No rubs, gallops or murmurs. Lungs: clear Abdomen: soft, nontender, nondistended. No  hepatosplenomegaly. No bruits or masses. Good bowel sounds. Extremities: no cyanosis, clubbing, rash, +4 BLE edema to thigh. BLE erythematous , warm to tough. Scabs to BLE.  Neuro: alert & oriented x 3, cranial nerves grossly intact. moves all 4 extremities w/o difficulty. Affect pleasant.   Telemetry   NSR 90s (Personally reviewed)    EKG    05/30/22: ST rightward axis 109 bpm  Labs   Basic Metabolic Panel: Recent Labs  Lab 05/30/22 1607 05/31/22 0603 06/01/22 0030  NA 142 136 138  K 2.4* 2.7* 3.4*  CL 104 102 104  CO2 28 25 25   GLUCOSE 126* 126* 110*  BUN 6 6 10   CREATININE 0.70 0.67 1.13*  CALCIUM 9.1 8.1* 8.4*  MG 1.4*  --  2.2    Liver Function Tests: Recent Labs  Lab 05/30/22 1607  AST 34  ALT 95*  ALKPHOS 146*  BILITOT 0.2*  PROT 6.0*  ALBUMIN 3.0*   No results for input(s): "LIPASE", "AMYLASE" in the last 168 hours. No results for input(s): "AMMONIA" in the last 168 hours.  CBC: Recent Labs  Lab 05/30/22 1607 05/31/22 0603  WBC 7.0 8.3  HGB 10.0* 9.9*  HCT 31.8* 32.5*  MCV 98.8 101.9*  PLT 565* 601*    Cardiac Enzymes: No results for input(s): "CKTOTAL", "CKMB", "CKMBINDEX", "TROPONINI" in the last 168 hours.  BNP: BNP (last 3 results) Recent Labs    05/30/22 1607  BNP 4,465.1*    ProBNP (last 3 results) No results for input(s): "PROBNP" in the last 8760 hours.   CBG: No results for input(s): "GLUCAP" in the last 168 hours.  Coagulation Studies: No results for input(s): "LABPROT", "INR" in the last 72 hours.   Imaging   ECHOCARDIOGRAM LIMITED  Result Date: 05/31/2022    ECHOCARDIOGRAM LIMITED REPORT   Patient Name:   SULMA LAMARRE Date of Exam: 05/31/2022 Medical Rec #:  [id number]      Height:       66.0 in Accession #:    [id number]     Weight:       115.7 lb Date of Birth:  [birth year]       BSA:          1.585 m Patient Age:    41 years       BP:           127/92 mmHg Patient Gender: F  HR:           99 bpm. Exam  Location:  Inpatient Procedure: 3D Echo, Limited Color Doppler, Color Doppler and Limited Echo Indications:     Aortic valve disorder I35.9  History:         Patient has prior history of Echocardiogram examinations, most                  recent 05/03/2022. CHF. GERD. IV drug use.  Sonographer:     Leta Jungling RDCS Referring Phys:  [id number] York Ram ARRIEN Diagnosing Phys: Epifanio Lesches MD  Sonographer Comments: Limited imaging due to patient compliance. IMPRESSIONS  1. Left ventricular ejection fraction, by estimation, is 30 to 35%. The left ventricle has moderately decreased function. The left ventricle demonstrates global hypokinesis. Left ventricular diastolic parameters are indeterminate.  2. Right ventricular systolic function is mildly reduced. The right ventricular size is mildly enlarged. There is mildly elevated pulmonary artery systolic pressure. The estimated right ventricular systolic pressure is 42.2 mmHg.  3. A small to moderate pericardial effusion is present.  4. The mitral valve is normal in structure. Mild mitral valve regurgitation. No evidence of mitral stenosis.  5. Tricuspid valve regurgitation is mild to moderate.  6. The aortic valve is tricuspid. Aortic valve regurgitation is moderate. No aortic stenosis is present.  7. Pulmonic valve regurgitation is moderate.  8. The inferior vena cava is dilated in size with <50% respiratory variability, suggesting right atrial pressure of 15 mmHg. Conclusion(s)/Recommendation(s): Consider cardiac MRI to work-up cardiomyopathy and quantify valvular disease. FINDINGS  Left Ventricle: Left ventricular ejection fraction, by estimation, is 30 to 35%. The left ventricle has moderately decreased function. The left ventricle demonstrates global hypokinesis. The left ventricular internal cavity size was normal in size. There is no left ventricular hypertrophy. Left ventricular diastolic parameters are indeterminate. Right Ventricle: The right  ventricular size is mildly enlarged. Right ventricular systolic function is mildly reduced. There is mildly elevated pulmonary artery systolic pressure. The tricuspid regurgitant velocity is 2.61 m/s, and with an assumed right atrial pressure of 15 mmHg, the estimated right ventricular systolic pressure is 42.2 mmHg. Pericardium: A small pericardial effusion is present. Mitral Valve: The mitral valve is normal in structure. Mild mitral valve regurgitation. No evidence of mitral valve stenosis. Tricuspid Valve: The tricuspid valve is normal in structure. Tricuspid valve regurgitation is mild to moderate. No evidence of tricuspid stenosis. Aortic Valve: The aortic valve is tricuspid. Aortic valve regurgitation is moderate. Aortic regurgitation PHT measures 300 msec. No aortic stenosis is present. Pulmonic Valve: The pulmonic valve was grossly normal. Pulmonic valve regurgitation is moderate. No evidence of pulmonic stenosis. Aorta: The aortic root and ascending aorta are structurally normal, with no evidence of dilitation. Venous: The inferior vena cava is dilated in size with less than 50% respiratory variability, suggesting right atrial pressure of 15 mmHg. Additional Comments: Spectral Doppler performed. Color Doppler performed.  LEFT VENTRICLE PLAX 2D LVIDd:         4.25 cm   Diastology LVIDs:         3.69 cm   LV e' medial:    7.50 cm/s LV PW:         0.86 cm   LV E/e' medial:  11.0 LV IVS:        0.67 cm   LV e' lateral:   6.94 cm/s LVOT diam:     1.90 cm   LV E/e' lateral: 11.9 LV SV:  37 LV SV Index:   23 LVOT Area:     2.84 cm                           3D Volume EF:                          3D EF:        32 %                          LV EDV:       124 ml                          LV ESV:       84 ml                          LV SV:        40 ml AORTIC VALVE LVOT Vmax:   79.50 cm/s LVOT Vmean:  55.800 cm/s LVOT VTI:    0.129 m AI PHT:      300 msec  AORTA Ao Root diam: 3.10 cm Ao Asc diam:  3.00 cm  MITRAL VALVE               TRICUSPID VALVE MV Area (PHT): 4.15 cm    TR Peak grad:   27.2 mmHg MV Decel Time: 183 msec    TR Vmax:        261.00 cm/s MV E velocity: 82.85 cm/s                            SHUNTS                            Systemic VTI:  0.13 m                            Systemic Diam: 1.90 cm Epifanio Lesches MD Electronically signed by Epifanio Lesches MD Signature Date/Time: 05/31/2022/2:27:41 PM    Final (Updated)      Medications:     Current Medications:  ascorbic acid  500 mg Oral Daily   Chlorhexidine Gluconate Cloth  6 each Topical Daily   vitamin D3  1,000 Units Oral Daily   folic acid  1 mg Oral Daily   furosemide  80 mg Intravenous BID   levETIRAcetam  500 mg Oral BID   mirtazapine  15 mg Oral QHS   multivitamin with minerals  1 tablet Oral Daily   pantoprazole  40 mg Oral Daily   sodium chloride flush  10-40 mL Intracatheter Q12H   sodium chloride flush  3 mL Intravenous Q12H   [START ON 06/02/2022] spironolactone  25 mg Oral Daily   vitamin A  10,000 Units Oral Daily    Infusions:  sodium chloride     thiamine (VITAMIN B1) injection 250 mg (06/01/22 1610)      Patient Profile   Ms. Sprunger is a 41 y.o. female with PMH of substance abuse, chronic bronchitis, hypovolemic shock, protein calorie malnutrition, seizures, bipolar disorder, and substance abuse. AHF team asked to see for acute combined systolic and diastolic CHF.   Assessment/Plan  Acute combined systolic and diastolic CHF -  05/03/22 EF 70-75%, 05/31/22: EF 30-35%, LV with global hypokinesis, RV mildly enlarged, small to mod pericardial effusion, mild MR, mild to mod TR, Mod AV regurgitation. Pulmonic valve regurgitation Mod.  - Presented NYHA II-III, +4 BLE edema, slightly SOB, DOE BNP >4K - Suspect she was overly rehydrated during last admission and her high volume intake.  - volume slightly elevated on exam on exam, continue 80 IV lasix BID  - increase spiro 12.5>25 daily - BB stopped  with acute exacerbation - Will start Jardiance tomorrow post cath, UTI was 4 weeks ago, not recurrent  - Can consider Entresto, BP stable for now.  - Place UNNA boots - Plan [facility] tomorrow - Continue fluid restriction, education provided d/t high intake - Strict I&O, daily weights Aortic insufficiency - has previously been worked up for this as AV regurgitation was severe in echo 10/8. Mod AV regurgitation on yesterdays echo - Consider TEE for further assessment, plan for Wednesday Hypokalemia - Increase spiro, repleat K. Last 2.7 - Mg 1.4, replaced - BMET pending  Chronic bronchitis - stable on RA Seizures - Mgmt per primary, on keppra  6. BLE swelling - Hx of RLE cellulitis in past.  - 10/17 Bilat [place]: No evidence of DVT seen in the lower extremities, bilaterally. No evidence of popliteal cyst, bilaterally.  - BLE +4 edema, pain, erythematous and warm to touch - Will try UNNA boots and diuresis for now 7. Protein calorie malnutrition - per primary  Length of Stay: [place] AGACNP-BC  06/01/2022, 12:19 PM  Advanced Heart Failure Team Pager [phone] (M-F; 7a - 5p)  Please contact [facility] for night-coverage after hours (4p -7a ) and weekends on [email or web address]   Patient seen with NP, agree with the above note.   History as reviewed above.   Paitent had an admission in 10/23 with nausea/vomiting for 4-6 weeks prior and 20 lbs weight loss.  She was hypotensive/hypovolemic and got significant IV fluid infused.  Echo in 10/23 showed hyperdynamic LV with EF 70-75%, normal RV, and possible severe AI (not well-visualized).    She came to the hospital this time with severe swelling in her lower legs and some dyspnea.  Echo was done this admission, showing EF 30-35%, mild RV dysfunction, small-moderate pericardial effusion, probably moderate AI, and dilated IVC.  She has been getting IV Lasix.   She has a history of prior IV drug abuse though she denies.  Heavy ETOH in past  but quit > 6 months ago.   She has a strong FH of CAD (multiple family members with MIs).   General: NAD, thin/frail Neck: JVP 10-12 cm with HJR, no thyromegaly or thyroid nodule.  Lungs: Clear to auscultation bilaterally with normal respiratory effort. CV: Nondisplaced PMI.  Heart regular S1/S2, no S3/S4, 1/6 SEM RUSB.  2+ edema to knees.  No carotid bruit.  Normal pedal pulses.  Abdomen: Soft, nontender, no hepatosplenomegaly, no distention.  Skin: Intact without lesions or rashes.  Neurologic: Alert and oriented x 3.  Psych: Normal affect. Extremities: No clubbing or cyanosis.  HEENT: Normal.   1. Acute systolic CHF: Echo this admission with EF 30-35%, mild RV dysfunction, small-moderate pericardial effusion, probably moderate AI, and dilated IVC. This is surprising given prior echo in 10/23 showed EF 70-75%, normal RV, and possible severe AI (not well-visualized).  BNP is markedly elevated. No chest pain but has strong FH of CAD.  Think we will need to rule out CAD with coronary angiography.  Would also consider thiamine deficiency/Beriberi syndrome with malnutrition.  Some form of stress/Takotsubo-type cardiomyopathy is also possible.  Will need cMRI if cath is negative.  She had a lot of IVF at last admission for presumed hypovolemic shock.  She remains volume overloaded on exam.  - Place unna boots.  - Lasix 80 mg IV bid and replace K.  - Increase spironolactone to 25 mg daily.  - Continue tiration of GDMT this admission.  - Will plan [facility] tomorrow to assess filling pressures and look for coronary disease. Discussed risks/benefits and she agrees to procedure.  If she does not have CAD, will need cMRI.  - Continue thiamine supplementation.  2. Aortic insufficiency: ?Severe on 10/23 echo, looks more in moderate range this admission.  - Think we should evaluate the aortic valve for abnormalities via TEE.  Will plan after cath.  3. Psych: Anxiety, ?PTSD, ?bipolar disorder.  Suspect this  plays a role in her malnutrition.  4. Malnutrition: Per primary.  5. ?Adrenal insufficiency: Did not have full workup of this last admission but not on steroids and probably not adrenally insufficient.  6. Prior heavy ETOH: Says she quit > 6 months ago.  7. Prior IVDU: Listed in chart but she denies.   Marca Ancona 06/01/2022 4:08 PM

## 2022-06-01 NOTE — Progress Notes (Signed)

## 2022-06-01 NOTE — Progress Notes (Signed)
Progress Note   Patient: Jamie Shields:[id number] DOB: Apr 06, 1981 DOA: 05/30/2022     2 DOS: the patient was seen and examined on 06/01/2022   Brief hospital course: Mrs. Kemp was admitted to the hospital with the working diagnosis of decompensated heart failure.   41 yo female with the past medica history of bipolar, substance abuse, chronic bronchitis, seizures who presented with dyspnea.  Recent hospitalization 10/06 to 05/14/22 for hypovolemic shock treated with volume resuscitation, vasopressors, midodrine and stress dose systemic corticosteroids.  Reported worsening lower extremity for few weeks, associated with dyspnea on exertion. Patient was evaluated at a urgent care clinic and referred to the ED for further evaluation, On her initial physical examination her blood pressure was 144/99, HR 104, RR 24 and 02 saturation 98%, lungs with coarse breath sounds with rales bilaterally, heart with S1 and S2 present and rhythmic, abdomen with no distention, positive lower extremity edema.  Na 142, K 2,4 CL 104 bicarbonate 28, glucose 126 bun 6 cr 0,70 Mg 1,4  AST 34, ALT 95,  BNP 4,465 Wbc 7,0 hgb 10,0 plt 565  Urine analysis with SG 1,006, negative for protein, negative for leukocytes.   Chest radiograph with no cardiomegaly, bilateral hilar vascular congestion with bilateral small pleural effusion, cephalization of the vasculature.  EKG 109 bpn, right axis deviation, normal intervals, sinus rhythm with poor R R wave progression with no significant ST segment or T wave changes. Positive LVH.   Patient was placed on furosemide for diuresis.   11/06 continue with hypervolemia. Echocardiogram with significant reduction in LV systolic function. Will continue aggressive diuresis, patient likely will need cardiac catheterization as part of the workup. Will consult cardiology.   Assessment and Plan: * Acute combined systolic and diastolic CHF, NYHA class 1 (HCC) Echocardiogram with reduced  LV systolic function 30 to 53%, with global hypokinesis. Mild reduction in RV systolic function, mild enlarged RV cavity, RVSP 42.2 mmHg, small pericardial effusion, aortic valve with moderate aortic regurgitation,   Continue to have edema at her lower extremities and signs of volume overload. Urine output documented at 6,144 ml Systolic blood pressure is 107 to 122 mmHg.   Diuresis with furosemide, increase dose to 80 mg IV q12 hrs and spironolactone to augment diuresis. No SGLT 2 inh due to recent urinary tract infection.  Unna boots.  Considering drop in her LV systolic function cardiology has been consulted for further work up, she may need cardiac catheterization.   Hypokalemia Hypomagnesemia  Follow up on renal function and electrolytes today, and correct electrolytes as indicated. Keep K at 4 and mg at 2.   History of seizures Resume Keppra 500 mg bid per her home regimen No active seizures.   Bipolar disease, chronic ([place]) Continue with as needed lorazepam and hydroxyzine On scheduled mirtazapine,   Severe protein-calorie malnutrition (HCC) Continue vitamin supplementation vitamin C, vitamin A and folic acid On high dose thiamine, possible wet Beriberi. Follow up on thiamine level.         Subjective: Patient with persistent edema and dyspnea, no chest pain   Physical Exam: Vitals:   06/01/22 0053 06/01/22 0100 06/01/22 0628 06/01/22 0736  BP: 111/88  (!) 118/94 (!) 122/91  Pulse: (!) 102   (!) 106  Resp: 20  20 18   Temp: 98.4 F (36.9 C)  (!) 97.5 F (36.4 C) 98 F (36.7 C)  TempSrc: Oral  Oral Oral  SpO2:    97%  Weight:  52.3 kg  Neurology awake and alert ENT with mild pallor Cardiovascular with S1 and S2 present and rhythmic, positive S3 gallop and systolic murmur at the apex Positive moderate JVD Positive lower extremity edema +++ pitting  Respiratory with diffuse rales bilaterally with no wheezing Abdomen with no distention   Data  Reviewed:    Family Communication: no family at the bedside   Disposition: Status is: Inpatient Remains inpatient appropriate because: heart failure   Planned Discharge Destination: Home      Author: Coralie Keens, MD 06/01/2022 11:03 AM  For on call review [email or web address].

## 2022-06-02 ENCOUNTER — Other Ambulatory Visit (HOSPITAL_COMMUNITY): Payer: Self-pay

## 2022-06-02 ENCOUNTER — Telehealth (HOSPITAL_COMMUNITY): Payer: Self-pay | Admitting: Pharmacy Technician

## 2022-06-02 ENCOUNTER — Encounter (HOSPITAL_COMMUNITY)
Admission: EM | Disposition: A | Discharge status: Home / Self Care | Payer: Self-pay | Source: Home / Self Care | Attending: Internal Medicine

## 2022-06-02 DIAGNOSIS — I5041 Acute combined systolic (congestive) and diastolic (congestive) heart failure: Secondary | ICD-10-CM | POA: Diagnosis not present

## 2022-06-02 DIAGNOSIS — I429 Cardiomyopathy, unspecified: Secondary | ICD-10-CM

## 2022-06-02 DIAGNOSIS — E876 Hypokalemia: Secondary | ICD-10-CM | POA: Diagnosis not present

## 2022-06-02 DIAGNOSIS — Z87898 Personal history of other specified conditions: Secondary | ICD-10-CM | POA: Diagnosis not present

## 2022-06-02 DIAGNOSIS — F319 Bipolar disorder, unspecified: Secondary | ICD-10-CM | POA: Diagnosis not present

## 2022-06-02 HISTORY — PX: RIGHT/LEFT HEART CATH AND CORONARY ANGIOGRAPHY: CATH118266

## 2022-06-02 LAB — POCT I-STAT EG7
Acid-Base Excess: 7 mmol/L — ABNORMAL HIGH (ref 0.0–2.0)
Bicarbonate: 32.5 mmol/L — ABNORMAL HIGH (ref 20.0–28.0)
Calcium, Ion: 1.22 mmol/L (ref 1.15–1.40)
HCT: 28 % — ABNORMAL LOW (ref 36.0–46.0)
Hemoglobin: 9.5 g/dL — ABNORMAL LOW (ref 12.0–15.0)
O2 Saturation: 59 %
Potassium: 3.7 mmol/L (ref 3.5–5.1)
Sodium: 140 mmol/L (ref 135–145)
TCO2: 34 mmol/L — ABNORMAL HIGH (ref 22–32)
pCO2, Ven: 47.3 mmHg (ref 44–60)
pH, Ven: 7.445 — ABNORMAL HIGH (ref 7.25–7.43)
pO2, Ven: 30 mmHg — CL (ref 32–45)

## 2022-06-02 LAB — PREGNANCY, URINE: Preg Test, Ur: NEGATIVE

## 2022-06-02 LAB — BASIC METABOLIC PANEL
Anion gap: 8 (ref 5–15)
BUN: 16 mg/dL (ref 6–20)
CO2: 30 mmol/L (ref 22–32)
Calcium: 8.6 mg/dL — ABNORMAL LOW (ref 8.9–10.3)
Chloride: 102 mmol/L (ref 98–111)
Creatinine, Ser: 0.86 mg/dL (ref 0.44–1.00)
GFR, Estimated: >60 mL/min (ref >60)
Glucose, Bld: 104 mg/dL — ABNORMAL HIGH (ref 70–99)
Potassium: 4 mmol/L (ref 3.5–5.1)
Sodium: 140 mmol/L (ref 135–145)

## 2022-06-02 LAB — MAGNESIUM: Magnesium: 1.5 mg/dL — ABNORMAL LOW (ref 1.7–2.4)

## 2022-06-02 SURGERY — RIGHT/LEFT HEART CATH AND CORONARY ANGIOGRAPHY
Anesthesia: LOCAL

## 2022-06-02 MED ORDER — MIDAZOLAM HCL 2 MG/2ML IJ SOLN
INTRAMUSCULAR | Status: DC | PRN
  Administered 2022-06-02: .5 mg via INTRAVENOUS
  Administered 2022-06-02: 1 mg via INTRAVENOUS
  Administered 2022-06-02: .5 mg via INTRAVENOUS

## 2022-06-02 MED ORDER — ACETAMINOPHEN 325 MG PO TABS: 650.0000 mg | ORAL_TABLET | ORAL | Status: DC | PRN

## 2022-06-02 MED ORDER — SODIUM CHLORIDE 0.9 % IV SOLN: 250.0000 mL | INTRAVENOUS | Status: DC | PRN

## 2022-06-02 MED ORDER — FUROSEMIDE 10 MG/ML IJ SOLN: 80.0000 mg | Freq: Two times a day (BID) | INTRAMUSCULAR | Status: DC

## 2022-06-02 MED ORDER — SODIUM CHLORIDE 0.9% FLUSH: 3.0000 mL | Freq: Two times a day (BID) | INTRAVENOUS | Status: DC

## 2022-06-02 MED ORDER — HEPARIN (PORCINE) IN NACL 1000-0.9 UT/500ML-% IV SOLN
INTRAVENOUS | Status: AC
  Filled 2022-06-02: qty 1000

## 2022-06-02 MED ORDER — LIDOCAINE HCL (PF) 1 % IJ SOLN
INTRAMUSCULAR | Status: DC | PRN
  Administered 2022-06-02 (×2): 2 mL via INTRADERMAL

## 2022-06-02 MED ORDER — HEPARIN SODIUM (PORCINE) 1000 UNIT/ML IJ SOLN
INTRAMUSCULAR | Status: DC | PRN
  Administered 2022-06-02: 2500 [IU] via INTRAVENOUS

## 2022-06-02 MED ORDER — HYDRALAZINE HCL 20 MG/ML IJ SOLN: 10.0000 mg | INTRAMUSCULAR | Status: AC | PRN

## 2022-06-02 MED ORDER — SODIUM CHLORIDE 0.9% FLUSH: 3.0000 mL | INTRAVENOUS | Status: DC | PRN

## 2022-06-02 MED ORDER — ONDANSETRON HCL 4 MG/2ML IJ SOLN: 4.0000 mg | Freq: Four times a day (QID) | INTRAMUSCULAR | Status: DC | PRN

## 2022-06-02 MED ORDER — MORPHINE SULFATE (PF) 2 MG/ML IV SOLN
2.0000 mg | Freq: Four times a day (QID) | INTRAVENOUS | Status: DC | PRN
  Administered 2022-06-02 – 2022-06-04 (×5): 2 mg via INTRAVENOUS
  Filled 2022-06-02 (×6): qty 1

## 2022-06-02 MED ORDER — HEPARIN SODIUM (PORCINE) 1000 UNIT/ML IJ SOLN
INTRAMUSCULAR | Status: AC
  Filled 2022-06-02: qty 10

## 2022-06-02 MED ORDER — FENTANYL CITRATE (PF) 100 MCG/2ML IJ SOLN
INTRAMUSCULAR | Status: AC
  Filled 2022-06-02: qty 2

## 2022-06-02 MED ORDER — SODIUM CHLORIDE 0.9 % IV SOLN: INTRAVENOUS | Status: DC

## 2022-06-02 MED ORDER — IOHEXOL 350 MG/ML SOLN
INTRAVENOUS | Status: DC | PRN
  Administered 2022-06-02: 30 mg

## 2022-06-02 MED ORDER — DIGOXIN 125 MCG PO TABS
0.1250 mg | ORAL_TABLET | Freq: Every day | ORAL | Status: DC
  Administered 2022-06-02 – 2022-06-04 (×3): 0.125 mg via ORAL
  Filled 2022-06-02 (×3): qty 1

## 2022-06-02 MED ORDER — LABETALOL HCL 5 MG/ML IV SOLN: 10.0000 mg | INTRAVENOUS | Status: AC | PRN

## 2022-06-02 MED ORDER — POTASSIUM CHLORIDE CRYS ER 20 MEQ PO TBCR
40.0000 meq | EXTENDED_RELEASE_TABLET | Freq: Once | ORAL | Status: AC
  Administered 2022-06-02: 40 meq via ORAL

## 2022-06-02 MED ORDER — DIPHENHYDRAMINE HCL 25 MG PO CAPS
25.0000 mg | ORAL_CAPSULE | ORAL | Status: DC | PRN
  Filled 2022-06-02: qty 1

## 2022-06-02 MED ORDER — DAPAGLIFLOZIN PROPANEDIOL 10 MG PO TABS
10.0000 mg | ORAL_TABLET | Freq: Every day | ORAL | Status: DC
  Administered 2022-06-02 – 2022-06-04 (×3): 10 mg via ORAL
  Filled 2022-06-02 (×3): qty 1

## 2022-06-02 MED ORDER — VERAPAMIL HCL 2.5 MG/ML IV SOLN
INTRAVENOUS | Status: DC | PRN
  Administered 2022-06-02: 10 mL via INTRA_ARTERIAL

## 2022-06-02 MED ORDER — FENTANYL CITRATE (PF) 100 MCG/2ML IJ SOLN
INTRAMUSCULAR | Status: DC | PRN
  Administered 2022-06-02: 25 ug via INTRAVENOUS
  Administered 2022-06-02: 12.5 ug via INTRAVENOUS
  Administered 2022-06-02: 25 ug via INTRAVENOUS

## 2022-06-02 MED ORDER — HEPARIN (PORCINE) IN NACL 1000-0.9 UT/500ML-% IV SOLN
INTRAVENOUS | Status: DC | PRN
  Administered 2022-06-02 (×2): 500 mL

## 2022-06-02 MED ORDER — MIDAZOLAM HCL 2 MG/2ML IJ SOLN
INTRAMUSCULAR | Status: AC
  Filled 2022-06-02: qty 2

## 2022-06-02 MED ORDER — MAGNESIUM SULFATE 4 GM/100ML IV SOLN
4.0000 g | Freq: Once | INTRAVENOUS | Status: AC
  Administered 2022-06-02: 4 g via INTRAVENOUS
  Filled 2022-06-02: qty 100

## 2022-06-02 MED ORDER — FUROSEMIDE 10 MG/ML IJ SOLN
80.0000 mg | Freq: Once | INTRAMUSCULAR | Status: AC
  Administered 2022-06-02: 80 mg via INTRAVENOUS
  Filled 2022-06-02: qty 8

## 2022-06-02 MED ORDER — ENOXAPARIN SODIUM 40 MG/0.4ML IJ SOSY
40.0000 mg | PREFILLED_SYRINGE | INTRAMUSCULAR | Status: DC
  Administered 2022-06-03 – 2022-06-04 (×2): 40 mg via SUBCUTANEOUS
  Filled 2022-06-02 (×2): qty 0.4

## 2022-06-02 MED ORDER — LIDOCAINE HCL (PF) 1 % IJ SOLN
INTRAMUSCULAR | Status: AC
  Filled 2022-06-02: qty 30

## 2022-06-02 MED ORDER — FUROSEMIDE 10 MG/ML IJ SOLN
80.0000 mg | Freq: Two times a day (BID) | INTRAMUSCULAR | Status: DC
  Administered 2022-06-02: 80 mg via INTRAVENOUS
  Filled 2022-06-02 (×2): qty 8

## 2022-06-02 SURGICAL SUPPLY — 18 items
BAND CMPR LRG ZPHR (HEMOSTASIS) ×1
BAND ZEPHYR COMPRESS 30 LONG (HEMOSTASIS) IMPLANT
CATH INFINITI 5 FR JL3.5 (CATHETERS) IMPLANT
CATH INFINITI 5 FR MPA2 (CATHETERS) IMPLANT
CATH INFINITI JR4 5F (CATHETERS) IMPLANT
CATH SWAN GANZ 7F STRAIGHT (CATHETERS) IMPLANT
GLIDESHEATH SLEND SS 6F .021 (SHEATH) IMPLANT
GLIDESHEATH SLENDER 7FR .021G (SHEATH) IMPLANT
GUIDEWIRE .025 260CM (WIRE) IMPLANT
GUIDEWIRE INQWIRE 1.5J.035X260 (WIRE) IMPLANT
INQWIRE 1.5J .035X260CM (WIRE) ×1
KIT HEART LEFT (KITS) ×2 IMPLANT
PACK CARDIAC CATHETERIZATION (CUSTOM PROCEDURE TRAY) ×2 IMPLANT
SHEATH GLIDE SLENDER 4/5FR (SHEATH) IMPLANT
SHEATH PROBE COVER 6X72 (BAG) IMPLANT
TRANSDUCER W/STOPCOCK (MISCELLANEOUS) ×2 IMPLANT
WIRE HI TORQ VERSACORE-J 145CM (WIRE) IMPLANT
WIRE MICROINTRODUCER 60CM (WIRE) IMPLANT

## 2022-06-02 NOTE — Telephone Encounter (Signed)
Patient Advocate Encounter  Prior Authorization for Jardiance 10MG  tablets has been approved.    PA# [id number] Effective dates: 06/02/2022 through 06/02/2023  Patients co-pay is $0.00.     [name], [place] Patient Advocate Specialist [place] Patient Advocate Team Direct Number: [phone]  Fax: [phone]

## 2022-06-02 NOTE — H&P (View-Only) (Signed)
Advanced Heart Failure Rounding Note  PCP-Cardiologist: None   Subjective:    Yesterday diuresed with IV lasix. Brisk diuresis noted.   Plan cath today.   Able to walk 24 feet with PT. Complained of leg discomfort. Denies SOB.   Objective:   Weight Range: 49.7 kg Body mass index is 17.68 kg/m.   Vital Signs:   Temp:  [97.6 F (36.4 C)-98.5 F (36.9 C)] 98.5 F (36.9 C) (11/07 0324) Pulse Rate:  [99-110] 110 (11/06 1800) Resp:  [18] 18 (11/07 0324) BP: (114-122)/(80-91) 114/88 (11/07 0324) SpO2:  [93 %-100 %] 93 % (11/07 0324) Weight:  [49.7 kg] 49.7 kg (11/07 0332) Last BM Date : 06/01/22  Weight change: Filed Weights   05/31/22 0234 06/01/22 0100 06/02/22 0332  Weight: 52.5 kg 52.3 kg 49.7 kg    Intake/Output:   Intake/Output Summary (Last 24 hours) at 06/02/2022 0715 Last data filed at 06/01/2022 2327 Gross per 24 hour  Intake 730 ml  Output 3850 ml  Net -3120 ml      Physical Exam    General:  No resp difficulty HEENT: Normal Neck: Supple. JVP 6-7 . Carotids 2+ bilat; no bruits. No lymphadenopathy or thyromegaly appreciated. Cor: PMI nondisplaced. Regular rate & rhythm. No rubs, gallops or murmurs. Lungs: Clear Abdomen: Soft, nontender, nondistended. No hepatosplenomegaly. No bruits or masses. Good bowel sounds. Extremities: No cyanosis, clubbing, rash, edema. R and LLE unna boots Neuro: Alert & orientedx3, cranial nerves grossly intact. moves all 4 extremities w/o difficulty. Affect pleasant   Telemetry   ST 100s   EKG    N/A  Labs    CBC Recent Labs    05/30/22 1607 05/31/22 0603  WBC 7.0 8.3  HGB 10.0* 9.9*  HCT 31.8* 32.5*  MCV 98.8 101.9*  PLT 565* [id number]*   Basic Metabolic Panel Recent Labs    06/01/22 0030 06/02/22 0053  NA 138 140  K 3.4* 4.0  CL 104 102  CO2 25 30  GLUCOSE 110* 104*  BUN 10 16  CREATININE 1.13* 0.86  CALCIUM 8.4* 8.6*  MG 2.2 1.5*   Liver Function Tests Recent Labs    05/30/22 1607  AST 34   ALT 95*  ALKPHOS 146*  BILITOT 0.2*  PROT 6.0*  ALBUMIN 3.0*   No results for input(s): "LIPASE", "AMYLASE" in the last 72 hours. Cardiac Enzymes No results for input(s): "CKTOTAL", "CKMB", "CKMBINDEX", "TROPONINI" in the last 72 hours.  BNP: BNP (last 3 results) Recent Labs    05/30/22 1607  BNP 4,465.1*    ProBNP (last 3 results) No results for input(s): "PROBNP" in the last 8760 hours.   D-Dimer No results for input(s): "DDIMER" in the last 72 hours. Hemoglobin A1C No results for input(s): "HGBA1C" in the last 72 hours. Fasting Lipid Panel No results for input(s): "CHOL", "HDL", "LDLCALC", "TRIG", "CHOLHDL", "LDLDIRECT" in the last 72 hours. Thyroid Function Tests No results for input(s): "TSH", "T4TOTAL", "T3FREE", "THYROIDAB" in the last 72 hours.  Invalid input(s): "FREET3"  Other results:   Imaging    No results found.   Medications:     Scheduled Medications:  ascorbic acid  500 mg Oral Daily   Chlorhexidine Gluconate Cloth  6 each Topical Daily   vitamin D3  1,000 Units Oral Daily   folic acid  1 mg Oral Daily   furosemide  80 mg Intravenous BID   levETIRAcetam  500 mg Oral BID   mirtazapine  15 mg Oral QHS  multivitamin with minerals  1 tablet Oral Daily   pantoprazole  40 mg Oral Daily   sodium chloride flush  10-40 mL Intracatheter Q12H   sodium chloride flush  3 mL Intravenous Q12H   sodium chloride flush  3 mL Intravenous Q12H   spironolactone  25 mg Oral Daily   vitamin A  10,000 Units Oral Daily    Infusions:  sodium chloride     sodium chloride     sodium chloride 10 mL/hr at 06/02/22 [id number]   thiamine (VITAMIN B1) injection 250 mg (06/01/22 2227)    PRN Medications: sodium chloride, sodium chloride, acetaminophen, hydrOXYzine, LORazepam, morphine injection, ondansetron (ZOFRAN) IV, sodium chloride flush, sodium chloride flush, sodium chloride flush    Patient Profile   Ms. Reddoch is a 41 y.o. female with PMH of substance  abuse, chronic bronchitis, hypovolemic shock, protein calorie malnutrition, seizures, bipolar disorder, and substance abuse. AHF team asked to see for acute combined systolic and diastolic CHF.    Assessment/Plan   1. Acute systolic CHF: Echo this admission with EF 30-35%, mild RV dysfunction, small-moderate pericardial effusion, probably moderate AI, and dilated IVC. This is surprising given prior echo in 10/23 showed EF 70-75%, normal RV, and possible severe AI (not well-visualized).  BNP is markedly elevated. No chest pain but has strong FH of CAD.  Think we will need to rule out CAD with coronary angiography.  Would also consider thiamine deficiency/Beriberi syndrome with malnutrition.  Some form of stress/Takotsubo-type cardiomyopathy is also possible.  Will need cMRI if cath is negative.  She had a lot of IVF at last admission for presumed hypovolemic shock. - Volume status improved. Give one more dose of IV lasix. Reassess after cath.  Replace Mag.  - Add 0.125 digoxin daily.  - Continue spironolactone to 25 mg daily.  - Add farxiga after cath.  - Continue titration of GDMT this admission.  - Plan LHC/RHC today to assess filling pressures and look for coronary disease. Discussed risks/benefits and she agrees to procedure.  If she does not have CAD, will need cMRI.  - Continue thiamine supplementation.  2. Aortic insufficiency: ?Severe on 10/23 echo, looks more in moderate range this admission.  - Think we should evaluate the aortic valve for abnormalities via TEE.  Will plan after cath.  3. Psych: Anxiety, ?PTSD, ?bipolar disorder.  Suspect this plays a role in her malnutrition.  4. Protein Malnutrition: Per primary.  5. ?Adrenal insufficiency: Did not have full workup of this last admission but not on steroids and probably not adrenally insufficient.  6. Prior heavy ETOH: Says she quit > 6 months ago.  7. Prior IVDU: Listed in chart but she denies.   Replace Mag  PT following.  Recommending Outpatient PT. Mobilize.    Length of Stay: 3  Amy Clegg, NP  06/02/2022, 7:15 AM  Advanced Heart Failure Team Pager [phone] (M-F; 7a - 5p)  Please contact [facility] for night-coverage after hours (5p -7a ) and weekends on [email or web address]  Patient seen with NP, agree with the above note.    She diuresed well, I/Os net negative 2880 yesterday with weight down.  Breathing improved.   General: Thin/frail Neck: JVP 8 cm, no thyromegaly or thyroid nodule.  Lungs: Clear to auscultation bilaterally with normal respiratory effort. CV: Nondisplaced PMI.  Heart regular S1/S2, no S3/S4, 1/6 SEM RUSB.  1+ ankle edema.    Abdomen: Soft, nontender, no hepatosplenomegaly, no distention.  Skin: Intact without lesions or rashes.  Neurologic:  Alert and oriented x 3.  Psych: Normal affect. Extremities: No clubbing or cyanosis.  HEENT: Normal.   Give 1 more dose of IV Lasix today, likely to po diuretic tomorrow.  Will start Farxiga 10 mg daily and digoxin 0.125 today.  Continue spironolactone.   Plan today for RHC/LHC to assess for coronary disease and check filling pressures. We discussed risks/benefits and she agrees to procedure. cMRI if no significant CAD.   Will need TEE to assess aortic valve, hopefully can do tomorrow.   Loralie Champagne 06/02/2022 8:36 AM

## 2022-06-02 NOTE — Evaluation (Signed)
Occupational Therapy Evaluation Patient Details Name: Jamie Shields MRN: [id number] DOB: 05/23/1981 Today's Date: 06/02/2022   History of Present Illness 41 yo female admitted 11/3 with bil LE edema, DOE and decompensated CHF. PMHx:malnourished, bipolar, substance abuse, bronchitis, Sz   Clinical Impression   Jamie Shields was evaluated s/p the above admission list, she is typically indep at baseline and works at a [employer]. Upon evaluation pt had functional limitations due to bilat LE pain, generalized weakness and decreased activity tolerance. Overall she required up to min G or ADLs and short distance mobility. She will benefit from OT acutely to progress towards indep baseline. Recommend d/c to home without follow up needed.      Recommendations for follow up therapy are one component of a multi-disciplinary discharge planning process, led by the attending physician.  Recommendations may be updated based on patient status, additional functional criteria and insurance authorization.   Follow Up Recommendations  No OT follow up    Assistance Recommended at Discharge Intermittent Supervision/Assistance  Patient can return home with the following Assistance with cooking/housework;Assist for transportation;A little help with bathing/dressing/bathroom;A little help with walking and/or transfers    Functional Status Assessment  Patient has had a recent decline in their functional status and demonstrates the ability to make significant improvements in function in a reasonable and predictable amount of time.  Equipment Recommendations  None recommended by OT       Precautions / Restrictions Precautions Precautions: Fall Precaution Comments: bil LE edema, decreased sensation Restrictions Weight Bearing Restrictions: No      Mobility Bed Mobility Overal bed mobility: Modified Independent                  Transfers Overall transfer level: Needs assistance Equipment used: Rolling  walker (2 wheels) Transfers: Sit to/from Stand Sit to Stand: Supervision           General transfer comment: slow and deliberate due to pain      Balance Overall balance assessment: Needs assistance Sitting-balance support: No upper extremity supported, Feet supported Sitting balance-Leahy Scale: Good     Standing balance support: Bilateral upper extremity supported Standing balance-Leahy Scale: Poor                             ADL either performed or assessed with clinical judgement   ADL Overall ADL's : Needs assistance/impaired Eating/Feeding: NPO   Grooming: Min guard;Standing   Upper Body Bathing: Set up;Sitting   Lower Body Bathing: Min guard;Sit to/from stand   Upper Body Dressing : Set up;Sitting   Lower Body Dressing: Min guard;Sit to/from stand   Toilet Transfer: Min guard;Ambulation   Toileting- Clothing Manipulation and Hygiene: Supervision/safety;Sitting/lateral lean       Functional mobility during ADLs: Min guard General ADL Comments: limited by BLE pain and poor tolerance     Vision Baseline Vision/History: 0 No visual deficits Vision Assessment?: No apparent visual deficits            Pertinent Vitals/Pain Pain Assessment Pain Assessment: Faces Faces Pain Scale: Hurts even more Pain Location: bil LE Pain Descriptors / Indicators: Tightness, Sore Pain Intervention(s): Limited activity within patient's tolerance, Monitored during session     Hand Dominance Right   Extremity/Trunk Assessment Upper Extremity Assessment Upper Extremity Assessment: Overall WFL for tasks assessed   Lower Extremity Assessment Lower Extremity Assessment: Defer to PT evaluation   Cervical / Trunk Assessment Cervical / Trunk Assessment: Normal  Communication Communication Communication: No difficulties   Cognition Arousal/Alertness: Awake/alert Behavior During Therapy: WFL for tasks assessed/performed Overall Cognitive Status: Within  Functional Limits for tasks assessed                                 General Comments: frustrated with NPO status, otherwise WFL     General Comments  VSS on RA. BLEs wrapped, painful. pt with 675mL output this session            Home Living Family/patient expects to be discharged to:: Private residence Living Arrangements: Spouse/significant other Available Help at Discharge: Family;Available 24 hours/day Type of Home: Other(Comment) Home Access: Stairs to enter Entrance Stairs-Number of Steps: 2-3 Entrance Stairs-Rails: Right Home Layout: One level     Bathroom Shower/Tub: Tub/shower unit         Home Equipment: Grab bars - tub/shower;Shower seat - built in;Cane - single point   Additional Comments: lives in a camper, works at a [employer]. Rw will not fit in camper but cane will      Prior Functioning/Environment Prior Level of Function : Independent/Modified Independent;Working/employed             Mobility Comments: No issues with mobility prior to becoming unwell          OT Problem List: Decreased strength;Decreased range of motion;Decreased activity tolerance;Impaired balance (sitting and/or standing);Pain;Decreased safety awareness;Decreased knowledge of use of DME or AE;Decreased knowledge of precautions      OT Treatment/Interventions: Self-care/ADL training;Therapeutic exercise;DME and/or AE instruction;Therapeutic activities;Patient/family education;Balance training    OT Goals(Current goals can be found in the care plan section) Acute Rehab OT Goals Patient Stated Goal: less pain OT Goal Formulation: With patient Time For Goal Achievement: 06/16/22 Potential to Achieve Goals: Good ADL Goals Pt Will Perform Lower Body Dressing: Independently;sit to/from stand Pt Will Transfer to Toilet: Independently;ambulating Additional ADL Goal #1: Pt will demonstrated increased activity tolerance to complete at least 10 minutes of OOB  functional activity without sitting rest break  OT Frequency: Min 2X/week       AM-PAC OT "6 Clicks" Daily Activity     Outcome Measure Help from another person eating meals?: None Help from another person taking care of personal grooming?: A Little Help from another person toileting, which includes using toliet, bedpan, or urinal?: A Little Help from another person bathing (including washing, rinsing, drying)?: A Little Help from another person to put on and taking off regular upper body clothing?: None Help from another person to put on and taking off regular lower body clothing?: A Little 6 Click Score: 20   End of Session Equipment Utilized During Treatment:  (BSC)  Activity Tolerance: Patient tolerated treatment well Patient left: in bed;with call bell/phone within reach  OT Visit Diagnosis: Muscle weakness (generalized) (M62.81);Other abnormalities of gait and mobility (R26.89);Unsteadiness on feet (R26.81);Pain                Time: 1031-1056 OT Time Calculation (min): 25 min Charges:  OT General Charges $OT Visit: 1 Visit OT Evaluation $OT Eval Moderate Complexity: 1 Mod OT Treatments $Self Care/Home Management : 8-22 mins   Elliot Cousin 06/02/2022, 10:59 AM

## 2022-06-02 NOTE — TOC Benefit Eligibility Note (Signed)
Patient Teacher, English as a foreign language completed.    The patient is currently admitted and upon discharge could be taking Jardiance 10 mg.  Prior Authorization Required.   The patient is insured through [employer] [place] [employer]     [name], [place] Patient Advocate Specialist [place] Patient Advocate Team Direct Number: [phone]  Fax: [phone]

## 2022-06-02 NOTE — Telephone Encounter (Signed)
Patient Advocate Encounter   Received notification that prior authorization for Jardiance 10MG  tablets is required.   PA submitted on 06/02/2022 Key [id number] Status is pending       [name], [place] Patient Advocate Specialist [place] Patient Advocate Team Direct Number: [phone]  Fax: [phone]

## 2022-06-02 NOTE — Progress Notes (Addendum)
  Advanced Heart Failure Rounding Note  PCP-Cardiologist: None   Subjective:    Yesterday diuresed with IV lasix. Brisk diuresis noted.   Plan cath today.   Able to walk 24 feet with PT. Complained of leg discomfort. Denies SOB.   Objective:   Weight Range: 49.7 kg Body mass index is 17.68 kg/m.   Vital Signs:   Temp:  [97.6 F (36.4 C)-98.5 F (36.9 C)] 98.5 F (36.9 C) (11/07 0324) Pulse Rate:  [99-110] 110 (11/06 1800) Resp:  [18] 18 (11/07 0324) BP: (114-122)/(80-91) 114/88 (11/07 0324) SpO2:  [93 %-100 %] 93 % (11/07 0324) Weight:  [49.7 kg] 49.7 kg (11/07 0332) Last BM Date : 06/01/22  Weight change: Filed Weights   05/31/22 0234 06/01/22 0100 06/02/22 0332  Weight: 52.5 kg 52.3 kg 49.7 kg    Intake/Output:   Intake/Output Summary (Last 24 hours) at 06/02/2022 0715 Last data filed at 06/01/2022 2327 Gross per 24 hour  Intake 730 ml  Output 3850 ml  Net -3120 ml      Physical Exam    General:  No resp difficulty HEENT: Normal Neck: Supple. JVP 6-7 . Carotids 2+ bilat; no bruits. No lymphadenopathy or thyromegaly appreciated. Cor: PMI nondisplaced. Regular rate & rhythm. No rubs, gallops or murmurs. Lungs: Clear Abdomen: Soft, nontender, nondistended. No hepatosplenomegaly. No bruits or masses. Good bowel sounds. Extremities: No cyanosis, clubbing, rash, edema. R and LLE unna boots Neuro: Alert & orientedx3, cranial nerves grossly intact. moves all 4 extremities w/o difficulty. Affect pleasant   Telemetry   ST 100s   EKG    N/A  Labs    CBC Recent Labs    05/30/22 1607 05/31/22 0603  WBC 7.0 8.3  HGB 10.0* 9.9*  HCT 31.8* 32.5*  MCV 98.8 101.9*  PLT 565* 601*   Basic Metabolic Panel Recent Labs    06/01/22 0030 06/02/22 0053  NA 138 140  K 3.4* 4.0  CL 104 102  CO2 25 30  GLUCOSE 110* 104*  BUN 10 16  CREATININE 1.13* 0.86  CALCIUM 8.4* 8.6*  MG 2.2 1.5*   Liver Function Tests Recent Labs    05/30/22 1607  AST 34   ALT 95*  ALKPHOS 146*  BILITOT 0.2*  PROT 6.0*  ALBUMIN 3.0*   No results for input(s): "LIPASE", "AMYLASE" in the last 72 hours. Cardiac Enzymes No results for input(s): "CKTOTAL", "CKMB", "CKMBINDEX", "TROPONINI" in the last 72 hours.  BNP: BNP (last 3 results) Recent Labs    05/30/22 1607  BNP 4,465.1*    ProBNP (last 3 results) No results for input(s): "PROBNP" in the last 8760 hours.   D-Dimer No results for input(s): "DDIMER" in the last 72 hours. Hemoglobin A1C No results for input(s): "HGBA1C" in the last 72 hours. Fasting Lipid Panel No results for input(s): "CHOL", "HDL", "LDLCALC", "TRIG", "CHOLHDL", "LDLDIRECT" in the last 72 hours. Thyroid Function Tests No results for input(s): "TSH", "T4TOTAL", "T3FREE", "THYROIDAB" in the last 72 hours.  Invalid input(s): "FREET3"  Other results:   Imaging    No results found.   Medications:     Scheduled Medications:  ascorbic acid  500 mg Oral Daily   Chlorhexidine Gluconate Cloth  6 each Topical Daily   vitamin D3  1,000 Units Oral Daily   folic acid  1 mg Oral Daily   furosemide  80 mg Intravenous BID   levETIRAcetam  500 mg Oral BID   mirtazapine  15 mg Oral QHS     multivitamin with minerals  1 tablet Oral Daily   pantoprazole  40 mg Oral Daily   sodium chloride flush  10-40 mL Intracatheter Q12H   sodium chloride flush  3 mL Intravenous Q12H   sodium chloride flush  3 mL Intravenous Q12H   spironolactone  25 mg Oral Daily   vitamin A  10,000 Units Oral Daily    Infusions:  sodium chloride     sodium chloride     sodium chloride 10 mL/hr at 06/02/22 0611   thiamine (VITAMIN B1) injection 250 mg (06/01/22 2227)    PRN Medications: sodium chloride, sodium chloride, acetaminophen, hydrOXYzine, LORazepam, morphine injection, ondansetron (ZOFRAN) IV, sodium chloride flush, sodium chloride flush, sodium chloride flush    Patient Profile   Ms. Schlesinger is a 41 y.o. female with PMH of substance  abuse, chronic bronchitis, hypovolemic shock, protein calorie malnutrition, seizures, bipolar disorder, and substance abuse. AHF team asked to see for acute combined systolic and diastolic CHF.    Assessment/Plan   1. Acute systolic CHF: Echo this admission with EF 30-35%, mild RV dysfunction, small-moderate pericardial effusion, probably moderate AI, and dilated IVC. This is surprising given prior echo in 10/23 showed EF 70-75%, normal RV, and possible severe AI (not well-visualized).  BNP is markedly elevated. No chest pain but has strong FH of CAD.  Think we will need to rule out CAD with coronary angiography.  Would also consider thiamine deficiency/Beriberi syndrome with malnutrition.  Some form of stress/Takotsubo-type cardiomyopathy is also possible.  Will need cMRI if cath is negative.  She had a lot of IVF at last admission for presumed hypovolemic shock. - Volume status improved. Give one more dose of IV lasix. Reassess after cath.  Replace Mag.  - Add 0.125 digoxin daily.  - Continue spironolactone to 25 mg daily.  - Add farxiga after cath.  - Continue titration of GDMT this admission.  - Plan LHC/RHC today to assess filling pressures and look for coronary disease. Discussed risks/benefits and she agrees to procedure.  If she does not have CAD, will need cMRI.  - Continue thiamine supplementation.  2. Aortic insufficiency: ?Severe on 10/23 echo, looks more in moderate range this admission.  - Think we should evaluate the aortic valve for abnormalities via TEE.  Will plan after cath.  3. Psych: Anxiety, ?PTSD, ?bipolar disorder.  Suspect this plays a role in her malnutrition.  4. Protein Malnutrition: Per primary.  5. ?Adrenal insufficiency: Did not have full workup of this last admission but not on steroids and probably not adrenally insufficient.  6. Prior heavy ETOH: Says she quit > 6 months ago.  7. Prior IVDU: Listed in chart but she denies.   Replace Mag  PT following.  Recommending Outpatient PT. Mobilize.    Length of Stay: 3  Amy Clegg, NP  06/02/2022, 7:15 AM  Advanced Heart Failure Team Pager [phone] (M-F; 7a - 5p)  Please contact [facility] for night-coverage after hours (5p -7a ) and weekends on [email or web address]  Patient seen with NP, agree with the above note.    She diuresed well, I/Os net negative 2880 yesterday with weight down.  Breathing improved.   General: Thin/frail Neck: JVP 8 cm, no thyromegaly or thyroid nodule.  Lungs: Clear to auscultation bilaterally with normal respiratory effort. CV: Nondisplaced PMI.  Heart regular S1/S2, no S3/S4, 1/6 SEM RUSB.  1+ ankle edema.    Abdomen: Soft, nontender, no hepatosplenomegaly, no distention.  Skin: Intact without lesions or rashes.  Neurologic:   Alert and oriented x 3.  Psych: Normal affect. Extremities: No clubbing or cyanosis.  HEENT: Normal.   Give 1 more dose of IV Lasix today, likely to po diuretic tomorrow.  Will start Farxiga 10 mg daily and digoxin 0.125 today.  Continue spironolactone.   Plan today for RHC/LHC to assess for coronary disease and check filling pressures. We discussed risks/benefits and she agrees to procedure. cMRI if no significant CAD.   Will need TEE to assess aortic valve, hopefully can do tomorrow.   Loralie Champagne 06/02/2022 8:36 AM

## 2022-06-02 NOTE — Telephone Encounter (Signed)
Pharmacy Patient Advocate Encounter  Insurance verification completed.    The patient is insured through [employer] [place] [employer]   The patient is currently admitted and ran test claims for the following: Jardiance.  Copays and coinsurance results were relayed to Inpatient clinical team.

## 2022-06-02 NOTE — Progress Notes (Addendum)
Progress Note   Patient: Jamie Shields XNA:[id number] DOB: [birth year] DOA: 05/30/2022     3 DOS: the patient was seen and examined on 06/02/2022   Brief hospital course: [name] was admitted to the hospital with the working diagnosis of decompensated heart failure.   41 yo female with the past medica history of bipolar, substance abuse, chronic bronchitis, seizures who presented with dyspnea.  Recent hospitalization 10/06 to 05/14/22 for hypovolemic shock treated with volume resuscitation, vasopressors, midodrine and stress dose systemic corticosteroids.  Reported worsening lower extremity for few weeks, associated with dyspnea on exertion. Patient was evaluated at a urgent care clinic and referred to the ED for further evaluation, On her initial physical examination her blood pressure was 144/99, HR 104, RR 24 and 02 saturation 98%, lungs with coarse breath sounds with rales bilaterally, heart with S1 and S2 present and rhythmic, abdomen with no distention, positive lower extremity edema.  Na 142, K 2,4 CL 104 bicarbonate 28, glucose 126 bun 6 cr 0,70 Mg 1,4  AST 34, ALT 95,  BNP 4,465 Wbc 7,0 hgb 10,0 plt 565  Urine analysis with SG 1,006, negative for protein, negative for leukocytes.   Chest radiograph with no cardiomegaly, bilateral hilar vascular congestion with bilateral small pleural effusion, cephalization of the vasculature.  EKG 109 bpn, right axis deviation, normal intervals, sinus rhythm with poor R R wave progression with no significant ST segment or T wave changes. Positive LVH.   Patient was placed on furosemide for diuresis.   11/06 continue with hypervolemia. Echocardiogram with significant reduction in LV systolic function. Will continue aggressive diuresis, patient likely will need cardiac catheterization as part of the workup. Will consult cardiology.  11/07 patient for right and left heart catheterization and TEE.    Assessment and Plan: * Acute combined systolic  and diastolic CHF, NYHA class 1 (HCC) Echocardiogram with reduced LV systolic function 30 to 35%, with global hypokinesis. Mild reduction in RV systolic function, mild enlarged RV cavity, RVSP 42.2 mmHg, small pericardial effusion, aortic valve with moderate aortic regurgitation,   Urine output 3,850 ml over last 24 hrs Systolic blood pressure 103 to 114 mmHg.   Continue diuresis with furosemide, spironolactone and empagliflozin.  Added digoxin.  Plan for cardiac catheterization and TEE for further cardiac work up.   Acute hypoxemic respiratory failure due to acute cardiogenic pulmonary edema Oxygenation has improved with diuresis  Currently oxygenation is 100% on room air (respiratory failure has resolved).   Hypokalemia Hypomagnesemia  Volume status has improved but not back to baseline. Renal function with serum cr at 0,86, K is 4,0 and serum bicarbonate at 30. Mag 1,5.   Plan to continue diuresis, add 40 Klc and 4 g mag sulfate. Follow up renal function in am.   History of seizures Resume Keppra 500 mg bid per her home regimen No active seizures.   Bipolar disease, chronic (HCC) Continue with as needed lorazepam and hydroxyzine On scheduled mirtazapine,   Severe protein-calorie malnutrition (HCC) Continue vitamin supplementation vitamin C, vitamin A and folic acid On high dose thiamine, possible wet Beriberi. Follow up on thiamine level.         Subjective: Patient with improved dyspnea but not back to baseline, positive lower extremity pain,  Physical Exam: Vitals:   06/01/22 2210 06/02/22 0324 06/02/22 0332 06/02/22 0741  BP: 116/80 114/88  103/70  Pulse:    99  Resp: 18 18  18   Temp: 98.3 F (36.8 C) 98.5 F (36.9 C)  98.3 F (36.8 C)  TempSrc: Oral   Oral  SpO2:  93%  93%  Weight:   49.7 kg    Neurology awake and alert ENT with mild pallor Cardiovascular with S1 and S2 present and rhythmic with no gallops or rubs, positive systolic murmur at the  right sternal border Respiratory with no rales or rhonchi, no wheezing Abdomen with no distention  Positive lower extremity edema ++ unna boots in place. Data Reviewed:    Family Communication: no family at the bedside, yesterday I spoke with her mother in law with her permission and all questions were addressed.   Disposition: Status is: Inpatient Remains inpatient appropriate because: heart failure   Planned Discharge Destination: Home      Author: Tawni Millers, MD 06/02/2022 10:47 AM  For on call review [email or web address].

## 2022-06-02 NOTE — Interval H&P Note (Signed)
History and Physical Interval Note:  06/02/2022 2:26 PM  Jamie Shields  has presented today for surgery, with the diagnosis of heart failure.  The various methods of treatment have been discussed with the patient and family. After consideration of risks, benefits and other options for treatment, the patient has consented to  Procedure(s): RIGHT/LEFT HEART CATH AND CORONARY ANGIOGRAPHY (N/A) as a surgical intervention.  The patient's history has been reviewed, patient examined, no change in status, stable for surgery.  I have reviewed the patient's chart and labs.  Questions were answered to the patient's satisfaction.     Brysan Mcevoy [employer]

## 2022-06-03 ENCOUNTER — Inpatient Hospital Stay (HOSPITAL_COMMUNITY): Payer: Medicaid Other

## 2022-06-03 ENCOUNTER — Encounter (HOSPITAL_COMMUNITY)
Admission: EM | Disposition: A | Discharge status: Home / Self Care | Payer: Self-pay | Source: Home / Self Care | Attending: Internal Medicine

## 2022-06-03 ENCOUNTER — Inpatient Hospital Stay (HOSPITAL_COMMUNITY): Payer: Medicaid Other | Admitting: Certified Registered Nurse Anesthetist

## 2022-06-03 ENCOUNTER — Encounter (HOSPITAL_COMMUNITY): Payer: Self-pay | Admitting: Cardiology

## 2022-06-03 DIAGNOSIS — I509 Heart failure, unspecified: Secondary | ICD-10-CM

## 2022-06-03 DIAGNOSIS — I351 Nonrheumatic aortic (valve) insufficiency: Secondary | ICD-10-CM

## 2022-06-03 DIAGNOSIS — I3139 Other pericardial effusion (noninflammatory): Secondary | ICD-10-CM

## 2022-06-03 DIAGNOSIS — J90 Pleural effusion, not elsewhere classified: Secondary | ICD-10-CM

## 2022-06-03 DIAGNOSIS — Z87891 Personal history of nicotine dependence: Secondary | ICD-10-CM

## 2022-06-03 DIAGNOSIS — I083 Combined rheumatic disorders of mitral, aortic and tricuspid valves: Secondary | ICD-10-CM

## 2022-06-03 DIAGNOSIS — Q2112 Patent foramen ovale: Secondary | ICD-10-CM

## 2022-06-03 HISTORY — PX: TEE WITHOUT CARDIOVERSION: SHX5443

## 2022-06-03 HISTORY — PX: BUBBLE STUDY: SHX6837

## 2022-06-03 LAB — POCT I-STAT EG7
Acid-Base Excess: 7 mmol/L — ABNORMAL HIGH (ref 0.0–2.0)
Bicarbonate: 32.5 mmol/L — ABNORMAL HIGH (ref 20.0–28.0)
Calcium, Ion: 1.21 mmol/L (ref 1.15–1.40)
HCT: 28 % — ABNORMAL LOW (ref 36.0–46.0)
Hemoglobin: 9.5 g/dL — ABNORMAL LOW (ref 12.0–15.0)
O2 Saturation: 61 %
Potassium: 3.7 mmol/L (ref 3.5–5.1)
Sodium: 140 mmol/L (ref 135–145)
TCO2: 34 mmol/L — ABNORMAL HIGH (ref 22–32)
pCO2, Ven: 47.5 mmHg (ref 44–60)
pH, Ven: 7.443 — ABNORMAL HIGH (ref 7.25–7.43)
pO2, Ven: 31 mmHg — CL (ref 32–45)

## 2022-06-03 LAB — BASIC METABOLIC PANEL
Anion gap: 10 (ref 5–15)
BUN: 12 mg/dL (ref 6–20)
CO2: 31 mmol/L (ref 22–32)
Calcium: 8.6 mg/dL — ABNORMAL LOW (ref 8.9–10.3)
Chloride: 98 mmol/L (ref 98–111)
Creatinine, Ser: 0.79 mg/dL (ref 0.44–1.00)
GFR, Estimated: >60 mL/min (ref >60)
Glucose, Bld: 107 mg/dL — ABNORMAL HIGH (ref 70–99)
Potassium: 3.3 mmol/L — ABNORMAL LOW (ref 3.5–5.1)
Sodium: 139 mmol/L (ref 135–145)

## 2022-06-03 LAB — MAGNESIUM: Magnesium: 1.9 mg/dL (ref 1.7–2.4)

## 2022-06-03 SURGERY — ECHOCARDIOGRAM, TRANSESOPHAGEAL
Anesthesia: Monitor Anesthesia Care

## 2022-06-03 MED ORDER — LIDOCAINE 2% (20 MG/ML) 5 ML SYRINGE
INTRAMUSCULAR | Status: DC | PRN
  Administered 2022-06-03 (×2): 20 mg via INTRAVENOUS

## 2022-06-03 MED ORDER — PROPOFOL 10 MG/ML IV BOLUS
INTRAVENOUS | Status: DC | PRN
  Administered 2022-06-03: 5 mg via INTRAVENOUS
  Administered 2022-06-03 (×2): 10 mg via INTRAVENOUS

## 2022-06-03 MED ORDER — PROPOFOL 500 MG/50ML IV EMUL
INTRAVENOUS | Status: DC | PRN
  Administered 2022-06-03: 150 ug/kg/min via INTRAVENOUS

## 2022-06-03 MED ORDER — ACETAMINOPHEN 500 MG PO TABS
1000.0000 mg | ORAL_TABLET | Freq: Once | ORAL | Status: AC
  Administered 2022-06-03: 1000 mg via ORAL

## 2022-06-03 MED ORDER — ACETAMINOPHEN 500 MG PO TABS
ORAL_TABLET | ORAL | Status: AC
  Filled 2022-06-03: qty 2

## 2022-06-03 MED ORDER — MAGNESIUM SULFATE 2 GM/50ML IV SOLN
2.0000 g | Freq: Once | INTRAVENOUS | Status: AC
  Administered 2022-06-03: 2 g via INTRAVENOUS
  Filled 2022-06-03: qty 50

## 2022-06-03 MED ORDER — POTASSIUM CHLORIDE CRYS ER 20 MEQ PO TBCR
40.0000 meq | EXTENDED_RELEASE_TABLET | Freq: Once | ORAL | Status: AC
  Administered 2022-06-03: 40 meq via ORAL
  Filled 2022-06-03: qty 2

## 2022-06-03 MED ORDER — FUROSEMIDE 20 MG PO TABS
20.0000 mg | ORAL_TABLET | Freq: Every day | ORAL | Status: DC
  Administered 2022-06-03 – 2022-06-04 (×2): 20 mg via ORAL
  Filled 2022-06-03 (×2): qty 1

## 2022-06-03 MED ORDER — SACUBITRIL-VALSARTAN 24-26 MG PO TABS
1.0000 | ORAL_TABLET | Freq: Two times a day (BID) | ORAL | Status: DC
  Administered 2022-06-03 – 2022-06-04 (×3): 1 via ORAL
  Filled 2022-06-03 (×3): qty 1

## 2022-06-03 MED ORDER — POTASSIUM CHLORIDE 20 MEQ PO PACK
40.0000 meq | PACK | Freq: Once | ORAL | Status: DC
  Filled 2022-06-03: qty 2

## 2022-06-03 MED ORDER — LORAZEPAM 2 MG/ML IJ SOLN: 1.0000 mg | Freq: Every day | INTRAMUSCULAR | Status: DC | PRN

## 2022-06-03 MED ORDER — GADOBUTROL 1 MMOL/ML IV SOLN
7.0000 mL | Freq: Once | INTRAVENOUS | Status: AC | PRN
  Administered 2022-06-03: 7 mL via INTRAVENOUS

## 2022-06-03 NOTE — Transfer of Care (Signed)
Immediate Anesthesia Transfer of Care Note  Patient: Jamie Shields  Procedure(s) Performed: TRANSESOPHAGEAL ECHOCARDIOGRAM (TEE) BUBBLE STUDY  Patient Location: PACU  Anesthesia Type:MAC  Level of Consciousness: awake and patient cooperative  Airway & Oxygen Therapy: Patient Spontanous Breathing and Patient connected to nasal cannula oxygen  Post-op Assessment: Report given to RN and Post -op Vital signs reviewed and stable  Post vital signs: Reviewed and stable  Last Vitals:  Vitals Value Taken Time  BP    Temp    Pulse 104 06/03/22 1013  Resp 17 06/03/22 1013  SpO2 93 % 06/03/22 1013  Vitals shown include unvalidated device data.  Last Pain:  Vitals:   06/03/22 0855  TempSrc:   PainSc: 7       Patients Stated Pain Goal: 0 (58/09/98 3382)  Complications: No notable events documented.

## 2022-06-03 NOTE — CV Procedure (Addendum)
Procedure: TEE  Sedation: Per anesthesiology  Indication: Aortic insufficiency  Findings: Please see echo section for full report.  Small pericardial effusion.  Normal LV size and wall thickness.  EF 30% with diffuse hypokinesis but relative preservation of apical function.  Normal RV size with mildly decreased systolic function.  Mildly dilated left atrium with no LA appendage thrombus. Normal right atrium.  Small PFO noted by color doppler but bubble study was negative.  Trivial tricuspid regurgitation, peak RV-RA gradient 21 mmHg.  Trivial mitral regurgitation. The aortic valve was trileaflet, there was mild-moderate central aortic insufficiency and no aortic stenosis.  The aortic root and ascending aorta were normal in caliber. IVC normal caliber.   Impression: AI is only mild-moderate.   Jamie Shields 06/03/2022 10:08 AM

## 2022-06-03 NOTE — Anesthesia Postprocedure Evaluation (Signed)
Anesthesia Post Note  Patient: Jamie Shields  Procedure(s) Performed: TRANSESOPHAGEAL ECHOCARDIOGRAM (TEE) BUBBLE STUDY     Patient location during evaluation: PACU Anesthesia Type: MAC Level of consciousness: awake and alert Pain management: pain level controlled Vital Signs Assessment: post-procedure vital signs reviewed and stable Respiratory status: spontaneous breathing, nonlabored ventilation, respiratory function stable and patient connected to nasal cannula oxygen Cardiovascular status: stable and blood pressure returned to baseline Postop Assessment: no apparent nausea or vomiting Anesthetic complications: no   No notable events documented.  Last Vitals:  Vitals:   06/03/22 1025 06/03/22 1112  BP: 115/77 120/81  Pulse: (!) 101 99  Resp: 19 18  Temp: 36.7 C (!) 36.4 C  SpO2: 98% 100%    Last Pain:  Vitals:   06/03/22 1112  TempSrc: Oral  PainSc:                  March Rummage Kristyn Obyrne

## 2022-06-03 NOTE — Anesthesia Procedure Notes (Signed)
Procedure Name: MAC Date/Time: 06/03/2022 9:42 AM  Performed by: Janene Harvey, CRNAPre-anesthesia Checklist: Patient identified, Emergency Drugs available, Suction available and Patient being monitored Patient Re-evaluated:Patient Re-evaluated prior to induction Oxygen Delivery Method: Nasal cannula Placement Confirmation: positive ETCO2 Dental Injury: Teeth and Oropharynx as per pre-operative assessment

## 2022-06-03 NOTE — Progress Notes (Signed)
PROGRESS NOTE    Jamie Shields  EUM:[id number] DOB: 26-Nov-1980 DOA: 05/30/2022 PCP: Patient, No Pcp Per   Jamie Shields is a 41 y.o. female with PMH of bipolar disorder, substance abuse, chronic bronchitis, hypovolemic shock, protein calorie malnutrition, seizures, bipolar disorder, and substance abuse.  -Recent hospitalization 10/6-10/19 with hypovolemic shock treated with fluid resuscitation, vasopressors, midodrine and stress dose steroids  -Admitted with dyspnea on exertion, from acute combined systolic and diastolic CHF.   EF down to 30-35%  Subjective: Going down for TEE, breathing improved  Assessment and Plan:  Acute combined systolic and diastolic CHF Aortic insufficiency Echo with EF 30 to 35%,Mild reduction in RV systolic function,small pericardial effusion, aortic valve with moderate aortic regurgitation,  -She is 5.2 L negative -RHC/LHC with mildly elevated filling pressures, no significant CAD -Heart failure team following, transition to oral Lasix, digoxin, Farxiga, Entresto and Aldactone -Continue thiamine, follow-up level -TEE noted mild to moderate AI -Cardiac MRI ordered  Hypokalemia Hypomagnesemia -Replace  History of seizures Resume Keppra 500 mg bid per her home regimen  Bipolar disease, chronic (HCC) Continue with as needed lorazepam and hydroxyzine On scheduled mirtazapine,   Severe protein-calorie malnutrition (HCC) Continue vitamin supplementation vitamin C, vitamin A and folic acid On high dose thiamine, possible wet Beriberi. Follow up on thiamine level.  -Dietitian consult  DVT prophylaxis: Lovenox Code Status: Full code Family Communication: None present Disposition Plan:   Consultants: CHF team   Procedures: TEE 11/8, AI is only mild to moderate  Antimicrobials:    Objective: Vitals:   06/03/22 1010 06/03/22 1015 06/03/22 1025 06/03/22 1112  BP: 109/80 104/89 115/77 120/81  Pulse: (!) 103 (!) 103 (!) 101 99  Resp: 18 15 19 18    Temp: 98.1 F (36.7 C)  98.1 F (36.7 C) (!) 97.5 F (36.4 C)  TempSrc:    Oral  SpO2: 93% 94% 98% 100%  Weight:        Intake/Output Summary (Last 24 hours) at 06/03/2022 1116 Last data filed at 06/03/2022 1005 Gross per 24 hour  Intake 530 ml  Output 1700 ml  Net -1170 ml   Filed Weights   06/01/22 0100 06/02/22 0332 06/03/22 0513  Weight: 52.3 kg 49.7 kg 45.9 kg    Examination:  General exam: Thinly built female sitting up in bed, AAOx3 HEENT: Mildly elevated JVD CVS: S1-S2, regular rhythm Abdomen: Soft, nontender, bowel sounds present Extremities: trace edema, UNNA boots Psychiatry:  Mood & affect appropriate.     Data Reviewed:   CBC: Recent Labs  Lab 05/30/22 1607 05/31/22 0603 06/02/22 1453  WBC 7.0 8.3  --   HGB 10.0* 9.9* 9.5*  9.5*  HCT 31.8* 32.5* 28.0*  28.0*  MCV 98.8 101.9*  --   PLT 565* 601*  --    Basic Metabolic Panel: Recent Labs  Lab 05/30/22 1607 05/31/22 0603 06/01/22 0030 06/02/22 0053 06/02/22 1453 06/03/22 0045  NA 142 136 138 140 140  140 139  K 2.4* 2.7* 3.4* 4.0 3.7  3.7 3.3*  CL 104 102 104 102  --  98  CO2 28 25 25 30   --  31  GLUCOSE 126* 126* 110* 104*  --  107*  BUN 6 6 10 16   --  12  CREATININE 0.70 0.67 1.13* 0.86  --  0.79  CALCIUM 9.1 8.1* 8.4* 8.6*  --  8.6*  MG 1.4*  --  2.2 1.5*  --  1.9   GFR: Estimated Creatinine Clearance: 67.7 mL/min (by  C-G formula based on SCr of 0.79 mg/dL). Liver Function Tests: Recent Labs  Lab 05/30/22 1607  AST 34  ALT 95*  ALKPHOS 146*  BILITOT 0.2*  PROT 6.0*  ALBUMIN 3.0*   No results for input(s): "LIPASE", "AMYLASE" in the last 168 hours. No results for input(s): "AMMONIA" in the last 168 hours. Coagulation Profile: No results for input(s): "INR", "PROTIME" in the last 168 hours. Cardiac Enzymes: No results for input(s): "CKTOTAL", "CKMB", "CKMBINDEX", "TROPONINI" in the last 168 hours. BNP (last 3 results) No results for input(s): "PROBNP" in the last 8760  hours. HbA1C: No results for input(s): "HGBA1C" in the last 72 hours. CBG: No results for input(s): "GLUCAP" in the last 168 hours. Lipid Profile: No results for input(s): "CHOL", "HDL", "LDLCALC", "TRIG", "CHOLHDL", "LDLDIRECT" in the last 72 hours. Thyroid Function Tests: No results for input(s): "TSH", "T4TOTAL", "FREET4", "T3FREE", "THYROIDAB" in the last 72 hours. Anemia Panel: No results for input(s): "VITAMINB12", "FOLATE", "FERRITIN", "TIBC", "IRON", "RETICCTPCT" in the last 72 hours. Urine analysis:    Component Value Date/Time   COLORURINE STRAW (A) 05/30/2022 1558   APPEARANCEUR CLEAR 05/30/2022 1558   LABSPEC 1.006 05/30/2022 1558   PHURINE 7.0 05/30/2022 1558   GLUCOSEU NEGATIVE 05/30/2022 1558   HGBUR NEGATIVE 05/30/2022 1558   BILIRUBINUR NEGATIVE 05/30/2022 1558   KETONESUR NEGATIVE 05/30/2022 1558   PROTEINUR NEGATIVE 05/30/2022 1558   UROBILINOGEN 1.0 04/28/2015 1210   NITRITE NEGATIVE 05/30/2022 1558   LEUKOCYTESUR SMALL (A) 05/30/2022 1558   Sepsis Labs: @LABRCNTIP (procalcitonin:4,lacticidven:4)  ) Recent Results (from the past 240 hour(s))  Blood culture (routine x 2)     Status: None (Preliminary result)   Collection Time: 05/30/22  4:09 PM   Specimen: BLOOD  Result Value Ref Range Status   Specimen Description BLOOD SITE NOT SPECIFIED  Final   Special Requests   Final    BOTTLES DRAWN AEROBIC AND ANAEROBIC Blood Culture adequate volume   Culture   Final    NO GROWTH 3 DAYS Performed at [facility], [address], [place], [place]    Report Status PENDING  Incomplete  Blood culture (routine x 2)     Status: None (Preliminary result)   Collection Time: 05/31/22  6:03 AM   Specimen: BLOOD  Result Value Ref Range Status   Specimen Description BLOOD RIGHT ANTECUBITAL  Final   Special Requests   Final    BOTTLES DRAWN AEROBIC AND ANAEROBIC Blood Culture results may not be optimal due to an inadequate volume of blood received in  culture bottles   Culture   Final    NO GROWTH 2 DAYS Performed at [facility], [address], [place], [place]    Report Status PENDING  Incomplete     Radiology Studies: CARDIAC CATHETERIZATION  Result Date: 06/02/2022 1. Normal coronaries, no significant CAD. 2. Mildly elevated left and right heart filling pressures. 3. Mild primarily pulmonary venous hypertension. 4. Preserved cardiac output.     Scheduled Meds:  ascorbic acid  500 mg Oral Daily   Chlorhexidine Gluconate Cloth  6 each Topical Daily   vitamin D3  1,000 Units Oral Daily   dapagliflozin propanediol  10 mg Oral Daily   digoxin  0.125 mg Oral Daily   enoxaparin (LOVENOX) injection  40 mg Subcutaneous Q24H   folic acid  1 mg Oral Daily   furosemide  20 mg Oral Daily   levETIRAcetam  500 mg Oral BID   mirtazapine  15 mg Oral QHS   multivitamin with minerals  1 tablet Oral Daily   pantoprazole  40 mg Oral Daily   potassium chloride  40 mEq Oral Once   sacubitril-valsartan  1 tablet Oral BID   sodium chloride flush  10-40 mL Intracatheter Q12H   sodium chloride flush  3 mL Intravenous Q12H   sodium chloride flush  3 mL Intravenous Q12H   sodium chloride flush  3 mL Intravenous Q12H   spironolactone  25 mg Oral Daily   vitamin A  10,000 Units Oral Daily   Continuous Infusions:  sodium chloride     sodium chloride     magnesium sulfate bolus IVPB 2 g (06/03/22 1048)   thiamine (VITAMIN B1) injection 250 mg (06/02/22 2139)     LOS: 4 days    Time spent:    Zannie Cove, MD [facility]   06/03/2022, 11:16 AM

## 2022-06-03 NOTE — Interval H&P Note (Signed)
History and Physical Interval Note:  06/03/2022 9:45 AM  Jamie Shields  has presented today for surgery, with the diagnosis of aortic insufficency.  The various methods of treatment have been discussed with the patient and family. After consideration of risks, benefits and other options for treatment, the patient has consented to  Procedure(s): TRANSESOPHAGEAL ECHOCARDIOGRAM (TEE) (N/A) as a surgical intervention.  The patient's history has been reviewed, patient examined, no change in status, stable for surgery.  I have reviewed the patient's chart and labs.  Questions were answered to the patient's satisfaction.     Joseph Johns [employer]

## 2022-06-03 NOTE — Progress Notes (Signed)
Patient ID: Jamie Shields, female   DOB: [birth year], 41 y.o.   MRN: [id number]     Advanced Heart Failure Rounding Note  PCP-Cardiologist: None   Subjective:    No complaints today, breathing improved.   TEE today: EF 30%, mild RV dysfunction, trileaflet aortic valve with mild-moderate central AI and no AS.   RHC/LHC:  Coronary Findings  Diagnostic Dominance: Right Left Main  Vessel was injected. Vessel is normal in caliber. Vessel is angiographically normal.    Left Anterior Descending  Vessel was injected. Vessel is normal in caliber. Vessel is angiographically normal.    Left Circumflex  Vessel was injected. Vessel is normal in caliber. Vessel is angiographically normal.    Right Coronary Artery  Vessel was injected. Vessel is normal in caliber. Vessel is angiographically normal.    Intervention   No interventions have been documented.   Right Heart  Right Heart Pressures RHC Procedural Findings: Hemodynamics (mmHg) RA mean 13 RV 44/16 PA 42/23,mean 33 PCWP mean 19 LV 124/31 AO 121/81  Oxygen saturations: PA 60% AO 98%  Cardiac Output (Fick) 4.04  Cardiac Index (Fick) 2.6 PVR 3.4 WU    Objective:   Weight Range: 45.9 kg Body mass index is 16.33 kg/m.   Vital Signs:   Temp:  [97.5 F (36.4 C)-98.3 F (36.8 C)] 98.3 F (36.8 C) (11/08 0513) Pulse Rate:  [96-109] 97 (11/08 0855) Resp:  [16-20] 20 (11/08 0855) BP: (111-126)/(73-96) 116/86 (11/08 0855) SpO2:  [91 %-100 %] 97 % (11/08 0855) Weight:  [45.9 kg] 45.9 kg (11/08 0513) Last BM Date : 06/02/22  Weight change: Filed Weights   06/01/22 0100 06/02/22 0332 06/03/22 0513  Weight: 52.3 kg 49.7 kg 45.9 kg    Intake/Output:   Intake/Output Summary (Last 24 hours) at 06/03/2022 1009 Last data filed at 06/03/2022 1005 Gross per 24 hour  Intake 530 ml  Output 1700 ml  Net -1170 ml      Physical Exam    General: NAD Neck: JVP 8 cm, no thyromegaly or thyroid nodule.  Lungs: Clear to  auscultation bilaterally with normal respiratory effort. CV: Nondisplaced PMI.  Heart regular S1/S2, no S3/S4, 1/6 SEM RUSB.  No peripheral edema.   Abdomen: Soft, nontender, no hepatosplenomegaly, no distention.  Skin: Intact without lesions or rashes.  Neurologic: Alert and oriented x 3.  Psych: Normal affect. Extremities: No clubbing or cyanosis.  HEENT: Normal.    Telemetry   NSR 90s (personally reviewed).   EKG    N/A  Labs    CBC Recent Labs    06/02/22 1453  HGB 9.5*  HCT 28.0*   Basic Metabolic Panel Recent Labs    93/81/01 0053 06/02/22 1453 06/03/22 0045  NA 140 140 139  K 4.0 3.7 3.3*  CL 102  --  98  CO2 30  --  31  GLUCOSE 104*  --  107*  BUN 16  --  12  CREATININE 0.86  --  0.79  CALCIUM 8.6*  --  8.6*  MG 1.5*  --  1.9   Liver Function Tests No results for input(s): "AST", "ALT", "ALKPHOS", "BILITOT", "PROT", "ALBUMIN" in the last 72 hours.  No results for input(s): "LIPASE", "AMYLASE" in the last 72 hours. Cardiac Enzymes No results for input(s): "CKTOTAL", "CKMB", "CKMBINDEX", "TROPONINI" in the last 72 hours.  BNP: BNP (last 3 results) Recent Labs    05/30/22 1607  BNP 4,465.1*    ProBNP (last 3 results) No results for input(s): "  PROBNP" in the last 8760 hours.   D-Dimer No results for input(s): "DDIMER" in the last 72 hours. Hemoglobin A1C No results for input(s): "HGBA1C" in the last 72 hours. Fasting Lipid Panel No results for input(s): "CHOL", "HDL", "LDLCALC", "TRIG", "CHOLHDL", "LDLDIRECT" in the last 72 hours. Thyroid Function Tests No results for input(s): "TSH", "T4TOTAL", "T3FREE", "THYROIDAB" in the last 72 hours.  Invalid input(s): "FREET3"  Other results:   Imaging    CARDIAC CATHETERIZATION  Result Date: 06/02/2022 1. Normal coronaries, no significant CAD. 2. Mildly elevated left and right heart filling pressures. 3. Mild primarily pulmonary venous hypertension. 4. Preserved cardiac output.      Medications:     Scheduled Medications:  [MAR Hold] ascorbic acid  500 mg Oral Daily   [MAR Hold] Chlorhexidine Gluconate Cloth  6 each Topical Daily   [MAR Hold] vitamin D3  1,000 Units Oral Daily   [MAR Hold] dapagliflozin propanediol  10 mg Oral Daily   [MAR Hold] digoxin  0.125 mg Oral Daily   [MAR Hold] enoxaparin (LOVENOX) injection  40 mg Subcutaneous Q24H   [MAR Hold] folic acid  1 mg Oral Daily   furosemide  20 mg Oral Daily   [MAR Hold] levETIRAcetam  500 mg Oral BID   [MAR Hold] mirtazapine  15 mg Oral QHS   [MAR Hold] multivitamin with minerals  1 tablet Oral Daily   [MAR Hold] pantoprazole  40 mg Oral Daily   sacubitril-valsartan  1 tablet Oral BID   [MAR Hold] sodium chloride flush  10-40 mL Intracatheter Q12H   [MAR Hold] sodium chloride flush  3 mL Intravenous Q12H   [MAR Hold] sodium chloride flush  3 mL Intravenous Q12H   [MAR Hold] sodium chloride flush  3 mL Intravenous Q12H   [MAR Hold] spironolactone  25 mg Oral Daily   [MAR Hold] vitamin A  10,000 Units Oral Daily    Infusions:  [MAR Hold] sodium chloride     sodium chloride 20 mL/hr at 06/03/22 0900   [MAR Hold] sodium chloride     [MAR Hold] thiamine (VITAMIN B1) injection 250 mg (06/02/22 2139)    PRN Medications: [MAR Hold] sodium chloride, [MAR Hold] sodium chloride, [MAR Hold] acetaminophen, [MAR Hold] diphenhydrAMINE, [MAR Hold] hydrOXYzine, [MAR Hold] LORazepam, [MAR Hold]  morphine injection, [MAR Hold] ondansetron (ZOFRAN) IV, [MAR Hold] sodium chloride flush, [MAR Hold] sodium chloride flush, [MAR Hold] sodium chloride flush    Patient Profile   Jamie Shields is a 41 y.o. female with PMH of substance abuse, chronic bronchitis, hypovolemic shock, protein calorie malnutrition, seizures, bipolar disorder, and substance abuse. AHF team asked to see for acute combined systolic and diastolic CHF.    Assessment/Plan   1. Acute systolic CHF: Nonischemic cardiomyopathy.  Echo this admission with  EF 30-35%, mild RV dysfunction, small-moderate pericardial effusion, probably moderate AI, and dilated IVC. This is surprising given prior echo in 10/23 showed EF 70-75%, normal RV, and possible severe AI (not well-visualized).  BNP was markedly elevated. LHC/RHC yesterday showed mildly elevated filling pressures, preserved cardiac output, and no significant coronary disease.  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  Some form of stress/Takotsubo-type cardiomyopathy is also possible.  Volume status looks good on exam today.  SBP 100s-110s.  - Transition to Lasix 20 mg daily.  - Can continue 0.125 digoxin daily.  - Continue spironolactone to 25 mg daily.  - Continue Farxiga 10 mg daily.  - Add Entresto 24/26 bid, watch BP.  - Cardiac MRI  ordered.   - Continue thiamine supplementation.  2. Aortic insufficiency: ?Severe on 10/23 echo.  TEE today showed mild-moderate AI with trileaflet valve.  - Will check regurgitant fraction by MRI.   3. Psych: Anxiety, ?PTSD, ?bipolar disorder.  Suspect this plays a role in her malnutrition.  4. Protein Malnutrition: Per primary.  5. ?Adrenal insufficiency: Did not have full workup of this last admission but not on steroids and probably not adrenally insufficient.  6. Prior heavy ETOH: Says she quit > 6 months ago.  7. Prior IVDU: Listed in chart but she denies.  8. Replace K and Mg.   Marca Ancona 06/03/2022 10:09 AM

## 2022-06-03 NOTE — Anesthesia Preprocedure Evaluation (Addendum)
Anesthesia Evaluation  Patient identified by MRN, date of birth, ID band Patient awake    Reviewed: Allergy & Precautions, NPO status , Patient's Chart, lab work & pertinent test results  Airway Mallampati: I  TM Distance: >3 FB     Dental  (+) Edentulous Upper, Edentulous Lower   Pulmonary former smoker   Pulmonary exam normal        Cardiovascular +CHF  + Valvular Problems/Murmurs AI  Rhythm:Regular Rate:Normal  ECHO 11/23:  1. Left ventricular ejection fraction, by estimation, is 30 to 35%. The  left ventricle has moderately decreased function. The left ventricle  demonstrates global hypokinesis. Left ventricular diastolic parameters are  indeterminate.   2. Right ventricular systolic function is mildly reduced. The right  ventricular size is mildly enlarged. There is mildly elevated pulmonary  artery systolic pressure. The estimated right ventricular systolic  pressure is 53.6 mmHg.   3. A small to moderate pericardial effusion is present.   4. The mitral valve is normal in structure. Mild mitral valve  regurgitation. No evidence of mitral stenosis.   5. Tricuspid valve regurgitation is mild to moderate.   6. The aortic valve is tricuspid. Aortic valve regurgitation is moderate.  No aortic stenosis is present.   7. Pulmonic valve regurgitation is moderate.   8. The inferior vena cava is dilated in size with <50% respiratory  variability, suggesting right atrial pressure of 15 mmHg.   Conclusion(s)/Recommendation(s): Consider cardiac MRI to work-up  cardiomyopathy and quantify valvular disease.     Neuro/Psych Seizures -,   Anxiety  Bipolar Disorder      GI/Hepatic Neg liver ROS,GERD  Medicated,,  Endo/Other  negative endocrine ROS    Renal/GU   negative genitourinary   Musculoskeletal negative musculoskeletal ROS (+)    Abdominal Normal abdominal exam  (+)   Peds  Hematology negative hematology ROS (+)    Anesthesia Other Findings   Reproductive/Obstetrics                             Anesthesia Physical Anesthesia Plan  ASA: 3  Anesthesia Plan: MAC   Post-op Pain Management:    Induction:   PONV Risk Score and Plan: 2 and Treatment may vary due to age or medical condition  Airway Management Planned:   Additional Equipment: None  Intra-op Plan:   Post-operative Plan:   Informed Consent: I have reviewed the patients History and Physical, chart, labs and discussed the procedure including the risks, benefits and alternatives for the proposed anesthesia with the patient or authorized representative who has indicated his/her understanding and acceptance.     Dental advisory given  Plan Discussed with: CRNA  Anesthesia Plan Comments:        Anesthesia Quick Evaluation

## 2022-06-03 NOTE — TOC Initial Note (Addendum)
Transition of Care [facility]) - Initial/Assessment Note    Patient Details  Name: Jamie Shields MRN: [id number] Date of Birth: April 22, 1981  [facility]) CM/SW Contact:    Elliot Cousin, RN Phone Number: [phone] 06/03/2022, 10:32 AM  Clinical Narrative:     Appt arranged with [facility] on 06/17/2022. Appt added to appt for follow up.               HF TOC CM spoke to pt at bedside. States her SO assist with transportation. States she does not have a PCP. Will arrange PCP appt. Pt reports she has a scale at home. Will continue to follow for dc needs.   Expected Discharge Plan: Home/Self Care Barriers to Discharge: Continued Medical Work up   Patient Goals and CMS Choice Patient states their goals for this hospitalization and ongoing recovery are:: wants to be able to care for herself      Expected Discharge Plan and Services Expected Discharge Plan: Home/Self Care   Discharge Planning Services: CM Consult                                          Prior Living Arrangements/Services   Lives with:: Significant Other   Do you feel safe going back to the place where you live?: Yes               Activities of Daily Living      Permission Sought/Granted Permission sought to share information with : Case Manager, Family Supports Permission granted to share information with : Yes, Verbal Permission Granted  Share Information with NAME: Davina Poke     Permission granted to share info w Relationship: SO  Permission granted to share info w Contact Information: [phone]  Emotional Assessment Appearance:: Appears stated age Attitude/Demeanor/Rapport: Engaged Affect (typically observed): Accepting Orientation: : Oriented to Self, Oriented to Place, Oriented to  Time, Oriented to Situation   Psych Involvement: No (comment)  Admission diagnosis:  Acute combined systolic and diastolic CHF, NYHA class 1 (HCC) [I50.41] Patient  Active Problem List   Diagnosis Date Noted   Hypomagnesemia 05/30/2022   Acute combined systolic and diastolic CHF, NYHA class 1 (HCC) 05/30/2022   Candida esophagitis (HCC)    LFTs abnormal    Loss of weight 05/06/2022   Normal anion gap metabolic acidosis 05/05/2022   DNR (do not resuscitate) 05/05/2022   Adrenal insufficiency (HCC) 05/05/2022   History of seizures 05/05/2022   Seizure disorder (HCC) 05/04/2022   Bipolar disease, chronic (HCC) 05/04/2022   Hypovolemic shock (HCC) 05/01/2022   Severe dehydration 05/01/2022   Nausea and vomiting 05/01/2022   Disorders of fluid, electrolyte, and acid-base balance 05/01/2022   Hyponatremia 05/01/2022   Hypokalemia 05/01/2022   AKI (acute kidney injury) (HCC) 05/01/2022   Severe protein-calorie malnutrition (HCC) 05/01/2022   Bursitis of hip    PCP:  Patient, No Pcp Per Pharmacy:   [id number] Pura Spice, [place] - [address] AT [facility] OF [address] [place] [address] Phone: [phone] Fax: [phone]  [facility] #[id number] Ginette Otto, [place] - [address] AT [facility] OF [facility] [address] [place] [address] Phone: [phone] Fax: [phone]  [facility] [place], [place] - [address]  [place]ot[address] Sh[address] [place] [address] Phone: [phone] Fax: [phone]  [facility] [address] [place] [address] Phone: [phone] Fax: [phone]     Social Determinants of Health (SDOH) Interventions    Readmission Risk Interventions     No data to display

## 2022-06-04 ENCOUNTER — Other Ambulatory Visit (HOSPITAL_COMMUNITY): Payer: Self-pay

## 2022-06-04 ENCOUNTER — Telehealth (HOSPITAL_COMMUNITY): Payer: Self-pay | Admitting: Pharmacy Technician

## 2022-06-04 LAB — COMPREHENSIVE METABOLIC PANEL
ALT: 47 U/L — ABNORMAL HIGH (ref 0–44)
AST: 43 U/L — ABNORMAL HIGH (ref 15–41)
Albumin: 2.8 g/dL — ABNORMAL LOW (ref 3.5–5.0)
Alkaline Phosphatase: 136 U/L — ABNORMAL HIGH (ref 38–126)
Anion gap: 10 (ref 5–15)
BUN: 15 mg/dL (ref 6–20)
CO2: 28 mmol/L (ref 22–32)
Calcium: 8.9 mg/dL (ref 8.9–10.3)
Chloride: 101 mmol/L (ref 98–111)
Creatinine, Ser: 1.01 mg/dL — ABNORMAL HIGH (ref 0.44–1.00)
GFR, Estimated: >60 mL/min (ref >60)
Glucose, Bld: 115 mg/dL — ABNORMAL HIGH (ref 70–99)
Potassium: 4 mmol/L (ref 3.5–5.1)
Sodium: 139 mmol/L (ref 135–145)
Total Bilirubin: 0.2 mg/dL — ABNORMAL LOW (ref 0.3–1.2)
Total Protein: 5.4 g/dL — ABNORMAL LOW (ref 6.5–8.1)

## 2022-06-04 LAB — CULTURE, BLOOD (ROUTINE X 2)
Culture: NO GROWTH
Special Requests: ADEQUATE

## 2022-06-04 LAB — VITAMIN B1: Vitamin B1 (Thiamine): 223.4 nmol/L — ABNORMAL HIGH (ref 66.5–200.0)

## 2022-06-04 MED ORDER — CARVEDILOL 3.125 MG PO TABS
3.1250 mg | ORAL_TABLET | Freq: Two times a day (BID) | ORAL | 0 refills | Status: DC
  Filled 2022-06-04: qty 30, 15d supply, fill #0

## 2022-06-04 MED ORDER — SACUBITRIL-VALSARTAN 24-26 MG PO TABS
1.0000 | ORAL_TABLET | Freq: Two times a day (BID) | ORAL | 0 refills | Status: DC
  Filled 2022-06-04: qty 60, 30d supply, fill #0

## 2022-06-04 MED ORDER — THIAMINE HCL 100 MG PO TABS
100.0000 mg | ORAL_TABLET | Freq: Every day | ORAL | 1 refills | Status: DC
  Filled 2022-06-04: qty 30, 30d supply, fill #0

## 2022-06-04 MED ORDER — SPIRONOLACTONE 25 MG PO TABS
25.0000 mg | ORAL_TABLET | Freq: Every day | ORAL | 0 refills | Status: DC
  Filled 2022-06-04: qty 30, 30d supply, fill #0

## 2022-06-04 MED ORDER — FUROSEMIDE 20 MG PO TABS
20.0000 mg | ORAL_TABLET | Freq: Every day | ORAL | 1 refills | Status: DC
  Filled 2022-06-04: qty 30, 30d supply, fill #0

## 2022-06-04 MED ORDER — THIAMINE MONONITRATE 100 MG PO TABS
250.0000 mg | ORAL_TABLET | Freq: Once | ORAL | Status: AC
  Administered 2022-06-04: 250 mg via ORAL
  Filled 2022-06-04: qty 3

## 2022-06-04 MED ORDER — DAPAGLIFLOZIN PROPANEDIOL 10 MG PO TABS
10.0000 mg | ORAL_TABLET | Freq: Every day | ORAL | 0 refills | Status: DC
  Filled 2022-06-04: qty 30, 30d supply, fill #0

## 2022-06-04 MED ORDER — DIGOXIN 125 MCG PO TABS
0.1250 mg | ORAL_TABLET | Freq: Every day | ORAL | 0 refills | Status: DC
  Filled 2022-06-04: qty 30, 30d supply, fill #0

## 2022-06-04 MED ORDER — OXYCODONE HCL 5 MG PO TABS
5.0000 mg | ORAL_TABLET | Freq: Once | ORAL | Status: AC
  Administered 2022-06-04: 5 mg via ORAL
  Filled 2022-06-04: qty 1

## 2022-06-04 MED ORDER — CARVEDILOL 3.125 MG PO TABS
3.1250 mg | ORAL_TABLET | Freq: Two times a day (BID) | ORAL | Status: DC
  Administered 2022-06-04: 3.125 mg via ORAL
  Filled 2022-06-04: qty 1

## 2022-06-04 MED ORDER — LEVETIRACETAM 500 MG PO TABS
500.0000 mg | ORAL_TABLET | Freq: Two times a day (BID) | ORAL | 0 refills | Status: DC
  Filled 2022-06-04: qty 60, 30d supply, fill #0

## 2022-06-04 NOTE — Telephone Encounter (Signed)
Patient Advocate Encounter  Prior Authorization for Entresto 24-26MG  tablets has been approved.    PA# [id number] Key: [id number] Effective dates: 06/04/2022 through 06/04/2023      Roland Earl, CPhT Pharmacy Patient Advocate Specialist [facility] Patient Advocate Team Direct Number: [phone]  Fax: [phone]

## 2022-06-04 NOTE — Progress Notes (Signed)
   06/04/22 1100  Mobility  Activity Ambulated with assistance in hallway;Ambulated independently in hallway  Level of Assistance Independent  Assistive Device None  Distance Ambulated (ft) 22 ft  Activity Response Tolerated well  Mobility Referral Yes  $Mobility charge 1 Mobility   Mobility Specialist Progress Note  Received pt in bed having no complaints and agreeable to mobility. Pt was asymptomatic throughout ambulation and returned to room w/o fault. Left in bed w/ call bell in reach and all needs met.  Lucious Groves Mobility Specialist

## 2022-06-04 NOTE — Progress Notes (Signed)
Pt in bed sitting up, pt received HF book education which includes daily weights, recording and reporting abnormal symptoms +weight gain, taking all meds, limiting na in diet, fluid intake restriction, and importance of exercise. When walking in hallway pt reports she is limited to claudication in BLE. Pt was given ex guidelines (intermittent walking) and educated on importance of CRPII. Pt also received additional materials on low sodium food. Pt will be referred to [facility].   Faustino Congress, MS EP 06/04/2022 10:18 AM

## 2022-06-04 NOTE — Discharge Summary (Signed)
Physician Discharge Summary  NASIA CANNAN DGU:[id number] DOB: [birth year] DOA: 05/30/2022  PCP: Patient, No Pcp Per  Admit date: 05/30/2022 Discharge date: 06/04/2022  Time spent: 35 minutes  Recommendations for Outpatient Follow-up:  CHF clinic in 2 weeks, follow-up cardiac MRI Follow-up vitamin B1 level-pending at discharge BMP in 1 week   Discharge Diagnoses:  Principal Problem:   Acute combined systolic and diastolic CHF, NYHA class 1 (HCC) Aortic insufficiency   Hypokalemia   History of seizures   Bipolar disease, chronic (HCC)   Severe protein-calorie malnutrition (HCC)   DNR (do not resuscitate)   Discharge Condition: Improved  Diet recommendation: Low-sodium, heart healthy  Filed Weights   06/02/22 0332 06/03/22 0513 06/04/22 0530  Weight: 49.7 kg 45.9 kg 45.8 kg    History of present illness:  Ms. Brownley is a 41 y.o. female with PMH of bipolar disorder, substance abuse, chronic bronchitis, hypovolemic shock, protein calorie malnutrition, seizures, bipolar disorder, and substance abuse.  -Recent hospitalization 10/6-10/19 with hypovolemic shock treated with fluid resuscitation, vasopressors, midodrine and stress dose steroids  -Admitted with dyspnea on exertion, from acute combined systolic and diastolic CHF.   EF down to 30-35%  Hospital Course:   Acute combined systolic and diastolic CHF Aortic insufficiency Echo with EF 30 to 35%,Mild reduction in RV systolic function,small pericardial effusion, aortic valve with moderate aortic regurgitation,  -Diuresed with IV Lasix she is 6 L negative -RHC/LHC with mildly elevated filling pressures, no significant CAD -Heart failure team following, transitioned to oral Lasix, digoxin, Farxiga, Entresto and Aldactone -Continue thiamine, follow-up level-pending at discharge -TEE noted mild to moderate AI -Cardiac MRI completed, results pending at the time of discharge -Meds sent to [facility], heart failure clinic follow-up  arranged   Hypokalemia Hypomagnesemia -Replaced   History of seizures Resume Keppra 500 mg bid per her home regimen   Bipolar disease, chronic (HCC) Continue with as needed lorazepam and hydroxyzine On scheduled mirtazapine,    Severe protein-calorie malnutrition (HCC) Continue vitamin supplementation vitamin C, vitamin A and folic acid On high dose thiamine, possible wet Beriberi. Follow up on thiamine level.  -Dietitian consulted  Discharge Exam: Vitals:   06/04/22 0530 06/04/22 0758  BP: 119/78 106/76  Pulse: (!) 103 99  Resp: 17 18  Temp: 98.4 F (36.9 C) 98.4 F (36.9 C)  SpO2: 95% 100%   General exam: Thinly built female sitting up in bed, AAOx3 HEENT: no JVD CVS: S1-S2, regular rhythm Abdomen: Soft, nontender, bowel sounds present Extremities: 1 + edema -feet only Psychiatry:  Mood & affect appropriate.    Discharge Instructions   Discharge Instructions     Diet - low sodium heart healthy   Complete by: As directed    Increase activity slowly   Complete by: As directed       Allergies as of 06/04/2022       Reactions   Cucumber Extract Anaphylaxis   Keflex [cephalexin] Nausea And Vomiting   Macrobid [nitrofurantoin Monohydrate Macrocrystals] Hives, Swelling   Naproxen Nausea And Vomiting   Penicillins Nausea And Vomiting   Heavy vomitting Did PCN reaction causing immediate rash, facial/tongue/throat swelling, SOB or lightheadedness with hypotension: No swelling, but im not sure if i got a rash as i was red all over already Did PCN reaction causing severe rash involving mucus membranes or skin necrosis: No Did a PCN reaction that required hospitalization Yes went to the ED Has patient had a PCN reaction occurring within the last 10 years: No-childhood allergy If  all of the above answers are "NO", then may proceed with   Tramadol Nausea And Vomiting   Watermelon Concentrate Diarrhea   Darvocet [propoxyphene N-acetaminophen] Nausea And Vomiting,  Other (See Comments)   migraines   Flagyl [metronidazole Hcl] Hives, Swelling, Rash   Ketorolac Nausea And Vomiting, Other (See Comments)   migraines   Ultram [tramadol Hcl] Nausea And Vomiting, Other (See Comments)   migraines   Vicodin [hydrocodone-acetaminophen] Nausea And Vomiting, Other (See Comments)   migraines        Medication List     TAKE these medications    ascorbic acid 250 MG tablet Commonly known as: VITAMIN C Take 1 tablet (250 mg total) by mouth 2 (two) times daily.   carvedilol 3.125 MG tablet Commonly known as: COREG Take 1 tablet (3.125 mg total) by mouth 2 (two) times daily with a meal.   dapagliflozin propanediol 10 MG Tabs tablet Commonly known as: FARXIGA Take 1 tablet (10 mg total) by mouth daily. Start taking on: June 05, 2022   digoxin 0.125 MG tablet Commonly known as: LANOXIN Take 1 tablet (0.125 mg total) by mouth daily. Start taking on: June 05, 2022   folic acid 1 MG tablet Commonly known as: FOLVITE Take 1 tablet (1 mg total) by mouth daily.   furosemide 20 MG tablet Commonly known as: Lasix Take 1 tablet (20 mg total) by mouth daily.   hydrOXYzine 10 MG tablet Commonly known as: ATARAX Take 1 tablet (10 mg total) by mouth 3 (three) times daily as needed for anxiety.   levETIRAcetam 500 MG tablet Commonly known as: KEPPRA Take 1 tablet (500 mg total) by mouth 2 (two) times daily.   mirtazapine 15 MG tablet Commonly known as: REMERON Take 1 tablet (15 mg total) by mouth at bedtime.   multivitamin with minerals tablet Take 1 tablet by mouth daily.   pantoprazole 40 MG tablet Commonly known as: PROTONIX Take 1 tablet (40 mg total) by mouth daily.   sacubitril-valsartan 24-26 MG Commonly known as: ENTRESTO Take 1 tablet by mouth 2 (two) times daily.   spironolactone 25 MG tablet Commonly known as: ALDACTONE Take 1 tablet (25 mg total) by mouth daily. Start taking on: June 05, 2022   Thiamine Mononitrate  250 MG Tabs Take 100 mg by mouth daily.   vitamin A 3 MG (10000 UNITS) capsule Take 1 capsule (10,000 Units total) by mouth daily.   vitamin D3 25 MCG tablet Commonly known as: CHOLECALCIFEROL Take 1 tablet (1,000 Units total) by mouth daily.       Allergies  Allergen Reactions   Cucumber Extract Anaphylaxis   Keflex [Cephalexin] Nausea And Vomiting   Macrobid [Nitrofurantoin Monohydrate Macrocrystals] Hives and Swelling   Naproxen Nausea And Vomiting   Penicillins Nausea And Vomiting    Heavy vomitting Did PCN reaction causing immediate rash, facial/tongue/throat swelling, SOB or lightheadedness with hypotension: No swelling, but im not sure if i got a rash as i was red all over already Did PCN reaction causing severe rash involving mucus membranes or skin necrosis: No Did a PCN reaction that required hospitalization Yes went to the ED Has patient had a PCN reaction occurring within the last 10 years: No-childhood allergy If all of the above answers are "NO", then may proceed with   Tramadol Nausea And Vomiting   Watermelon Concentrate Diarrhea   Darvocet [Propoxyphene N-Acetaminophen] Nausea And Vomiting and Other (See Comments)    migraines   Flagyl [Metronidazole Hcl] Hives, Swelling and  Rash   Ketorolac Nausea And Vomiting and Other (See Comments)    migraines   Ultram [Tramadol Hcl] Nausea And Vomiting and Other (See Comments)    migraines   Vicodin [Hydrocodone-Acetaminophen] Nausea And Vomiting and Other (See Comments)    migraines    Follow-up Information     [facility]. Go on 06/17/2022.   Why: @8 :00am Contact information: [address] [place] [address] [phone]        [facility] Follow up on 06/11/2022.   Specialty: Cardiology Why: at 8:30 Contact information: [address] [id number] Wilhe[place] [place]  [address] [phone]                 The results of significant diagnostics from this hospitalization (including imaging, microbiology, ancillary and laboratory) are listed below for reference.    Significant Diagnostic Studies: CARDIAC CATHETERIZATION  Result Date: 06/02/2022 1. Normal coronaries, no significant CAD. 2. Mildly elevated left and right heart filling pressures. 3. Mild primarily pulmonary venous hypertension. 4. Preserved cardiac output.   ECHOCARDIOGRAM LIMITED  Result Date: 05/31/2022    ECHOCARDIOGRAM LIMITED REPORT   Patient Name:   MEZTLI NARINE Date of Exam: 05/31/2022 Medical Rec #:  [id number]      Height:       66.0 in Accession #:    [id number]     Weight:       115.7 lb Date of Birth:  Dec 14, 1980       BSA:          1.585 m Patient Age:    41 years       BP:           127/92 mmHg Patient Gender: F              HR:           99 bpm. Exam Location:  Inpatient Procedure: 3D Echo, Limited Color Doppler, Color Doppler and Limited Echo Indications:     Aortic valve disorder I35.9  History:         Patient has prior history of Echocardiogram examinations, most                  recent 05/03/2022. CHF. GERD. IV drug use.  Sonographer:     Leta Jungling RDCS Referring Phys:  [id number] York Ram ARRIEN Diagnosing Phys: Epifanio Lesches MD  Sonographer Comments: Limited imaging due to patient compliance. IMPRESSIONS  1. Left ventricular ejection fraction, by estimation, is 30 to 35%. The left ventricle has moderately decreased function. The left ventricle demonstrates global hypokinesis. Left ventricular diastolic parameters are indeterminate.  2. Right ventricular systolic function is mildly reduced. The right ventricular size is mildly enlarged. There is mildly elevated pulmonary artery systolic pressure. The estimated right ventricular systolic pressure is 42.2 mmHg.  3. A small to moderate pericardial effusion is present.  4. The mitral valve is normal in structure. Mild  mitral valve regurgitation. No evidence of mitral stenosis.  5. Tricuspid valve regurgitation is mild to moderate.  6. The aortic valve is tricuspid. Aortic valve regurgitation is moderate. No aortic stenosis is present.  7. Pulmonic valve regurgitation is moderate.  8. The inferior vena cava is dilated in size with <50% respiratory variability, suggesting right atrial pressure of 15 mmHg. Conclusion(s)/Recommendation(s): Consider cardiac MRI to work-up cardiomyopathy and quantify valvular disease. FINDINGS  Left Ventricle: Left ventricular ejection fraction, by estimation,  is 30 to 35%. The left ventricle has moderately decreased function. The left ventricle demonstrates global hypokinesis. The left ventricular internal cavity size was normal in size. There is no left ventricular hypertrophy. Left ventricular diastolic parameters are indeterminate. Right Ventricle: The right ventricular size is mildly enlarged. Right ventricular systolic function is mildly reduced. There is mildly elevated pulmonary artery systolic pressure. The tricuspid regurgitant velocity is 2.61 m/s, and with an assumed right atrial pressure of 15 mmHg, the estimated right ventricular systolic pressure is 42.2 mmHg. Pericardium: A small pericardial effusion is present. Mitral Valve: The mitral valve is normal in structure. Mild mitral valve regurgitation. No evidence of mitral valve stenosis. Tricuspid Valve: The tricuspid valve is normal in structure. Tricuspid valve regurgitation is mild to moderate. No evidence of tricuspid stenosis. Aortic Valve: The aortic valve is tricuspid. Aortic valve regurgitation is moderate. Aortic regurgitation PHT measures 300 msec. No aortic stenosis is present. Pulmonic Valve: The pulmonic valve was grossly normal. Pulmonic valve regurgitation is moderate. No evidence of pulmonic stenosis. Aorta: The aortic root and ascending aorta are structurally normal, with no evidence of dilitation. Venous: The inferior  vena cava is dilated in size with less than 50% respiratory variability, suggesting right atrial pressure of 15 mmHg. Additional Comments: Spectral Doppler performed. Color Doppler performed.  LEFT VENTRICLE PLAX 2D LVIDd:         4.25 cm   Diastology LVIDs:         3.69 cm   LV e' medial:    7.50 cm/s LV PW:         0.86 cm   LV E/e' medial:  11.0 LV IVS:        0.67 cm   LV e' lateral:   6.94 cm/s LVOT diam:     1.90 cm   LV E/e' lateral: 11.9 LV SV:         37 LV SV Index:   23 LVOT Area:     2.84 cm                           3D Volume EF:                          3D EF:        32 %                          LV EDV:       124 ml                          LV ESV:       84 ml                          LV SV:        40 ml AORTIC VALVE LVOT Vmax:   79.50 cm/s LVOT Vmean:  55.800 cm/s LVOT VTI:    0.129 m AI PHT:      300 msec  AORTA Ao Root diam: 3.10 cm Ao Asc diam:  3.00 cm MITRAL VALVE               TRICUSPID VALVE MV Area (PHT): 4.15 cm    TR Peak grad:   27.2 mmHg MV Decel Time: 183 msec    TR Vmax:  261.00 cm/s MV E velocity: 82.85 cm/s                            SHUNTS                            Systemic VTI:  0.13 m                            Systemic Diam: 1.90 cm Epifanio Lesches MD Electronically signed by Epifanio Lesches MD Signature Date/Time: 05/31/2022/2:27:41 PM    Final (Updated)    DG Chest Portable 1 View  Result Date: 05/30/2022 CLINICAL DATA:  Shortness of breath in a 41 year old female. EXAM: PORTABLE CHEST 1 VIEW COMPARISON:  May 02, 2022. FINDINGS: EKG leads project over the chest. Trachea is midline. Cardiomediastinal contours and hilar structures are stable and normal. Interval development of bibasilar airspace disease and small bilateral pleural effusions since previous imaging. Subtle increased interstitial markings with basilar predominance. No pneumothorax. On limited assessment no acute skeletal finding. IMPRESSION: Interval development of bibasilar airspace disease  and small bilateral pleural effusions since previous imaging. Potential mild interstitial edema. Correlate with any signs of heart failure or volume overload though heart is not substantially enlarged. Electronically Signed   By: Donzetta Kohut M.D.   On: 05/30/2022 18:23   DG Foot Complete Right  Result Date: 05/30/2022 CLINICAL DATA:  Bilateral foot pain/swelling, infection EXAM: RIGHT FOOT COMPLETE - 3+ VIEW COMPARISON:  None Available. FINDINGS: No fracture or dislocation is seen. The joint spaces are preserved. Moderate dorsal soft tissue swelling along the forefoot. No osseous destruction to suggest osteomyelitis. IMPRESSION: Moderate dorsal soft tissue swelling along the forefoot. Otherwise negative. Electronically Signed   By: Charline Bills M.D.   On: 05/30/2022 17:02   DG Foot Complete Left  Result Date: 05/30/2022 CLINICAL DATA:  Bilateral foot pain/swelling, infection EXAM: LEFT FOOT - COMPLETE 3+ VIEW COMPARISON:  None Available. FINDINGS: No fracture or dislocation is seen. The joint spaces are preserved. Severe dorsal soft tissue swelling along the midfoot. No osseous destruction to suggest osteomyelitis. IMPRESSION: Severe dorsal soft tissue swelling along the midfoot. Otherwise negative. Electronically Signed   By: Charline Bills M.D.   On: 05/30/2022 17:01   VAS [place] LOWER EXTREMITY VENOUS (DVT)  Result Date: 05/12/2022  Lower Venous DVT Study Patient Name:  AMIT MELOY  Date of Exam:   05/12/2022 Medical Rec #: [id number]       Accession #:    [id number] Date of Birth: [birth year]        Patient Gender: F Patient Age:   9 years Exam Location:  [facility] Procedure:      VAS [place] LOWER EXTREMITY VENOUS (DVT) Referring Phys: Lorin Glass --------------------------------------------------------------------------------  Indications: Swelling, and Edema.  Comparison Study: no prior Performing Technologist: [place] Ponder RVS  Examination Guidelines: A complete evaluation  includes B-mode imaging, spectral Doppler, color Doppler, and power Doppler as needed of all accessible portions of each vessel. Bilateral testing is considered an integral part of a complete examination. Limited examinations for reoccurring indications may be performed as noted. The reflux portion of the exam is performed with the patient in reverse Trendelenburg.  +---------+---------------+---------+-----------+----------+--------------+ RIGHT    CompressibilityPhasicitySpontaneityPropertiesThrombus Aging +---------+---------------+---------+-----------+----------+--------------+ CFV      Full           Yes  Yes                                 +---------+---------------+---------+-----------+----------+--------------+ SFJ      Full                                                        +---------+---------------+---------+-----------+----------+--------------+ FV Prox  Full                                                        +---------+---------------+---------+-----------+----------+--------------+ FV Mid   Full                                                        +---------+---------------+---------+-----------+----------+--------------+ FV DistalFull           Yes      Yes                                 +---------+---------------+---------+-----------+----------+--------------+ PFV      Full                                                        +---------+---------------+---------+-----------+----------+--------------+ POP      Full           Yes      Yes                                 +---------+---------------+---------+-----------+----------+--------------+ PTV      Full           Yes      Yes                                 +---------+---------------+---------+-----------+----------+--------------+ PERO     Full           Yes      Yes                                  +---------+---------------+---------+-----------+----------+--------------+   +---------+---------------+---------+-----------+----------+--------------+ LEFT     CompressibilityPhasicitySpontaneityPropertiesThrombus Aging +---------+---------------+---------+-----------+----------+--------------+ CFV      Full           Yes      Yes                                 +---------+---------------+---------+-----------+----------+--------------+ SFJ      Full                                                        +---------+---------------+---------+-----------+----------+--------------+  FV Prox  Full                                                        +---------+---------------+---------+-----------+----------+--------------+ FV Mid   Full                                                        +---------+---------------+---------+-----------+----------+--------------+ FV DistalFull                                                        +---------+---------------+---------+-----------+----------+--------------+ PFV      Full                                                        +---------+---------------+---------+-----------+----------+--------------+ POP      Full           Yes      Yes                                 +---------+---------------+---------+-----------+----------+--------------+ PTV      Full           Yes      Yes                                 +---------+---------------+---------+-----------+----------+--------------+ PERO     Full           Yes      Yes                                 +---------+---------------+---------+-----------+----------+--------------+     Summary: BILATERAL: - No evidence of deep vein thrombosis seen in the lower extremities, bilaterally. -No evidence of popliteal cyst, bilaterally.   *See table(s) above for measurements and observations. Electronically signed by Lemar Livings MD on 05/12/2022 at 3:26:41  PM.    Final    NM Hepatobiliary Liver Func  Result Date: 05/09/2022 CLINICAL DATA:  Right upper quadrant pain EXAM: NUCLEAR MEDICINE HEPATOBILIARY IMAGING TECHNIQUE: Sequential images of the abdomen were obtained out to 60 minutes following intravenous administration of radiopharmaceutical. RADIOPHARMACEUTICALS:  5.5 mCi Tc-77m  Choletec IV COMPARISON:  CT 05/02/2022, ultrasound 05/07/2022 FINDINGS: Prompt uptake and biliary excretion of activity by the liver is seen. Gallbladder activity is visualized, consistent with patency of cystic duct. Biliary activity passes into small bowel, consistent with patent common bile duct. IMPRESSION: No scintigraphic evidence of acute cholecystitis. Electronically Signed   By: Duanne Guess D.O.   On: 05/09/2022 12:19   US Abdomen Limited RUQ (LIVER/GB)  Result Date: 05/07/2022 CLINICAL DATA:  [id number] Elevated liver enzymes [id number] EXAM: ULTRASOUND ABDOMEN LIMITED RIGHT UPPER QUADRANT COMPARISON:  CT abdomen pelvis 05/02/2022 FINDINGS: Gallbladder: No gallstones. Gallbladder wall thickening and pericholecystic fluid. No sonographic Murphy sign noted by sonographer. Common bile duct: Diameter: 5 mm. Liver: No focal lesion identified. Within normal limits in parenchymal echogenicity. Portal vein is patent on color Doppler imaging with normal direction of blood flow towards the liver. Other: At least small volume simple free fluid ascites. At least trace volume right pleural effusion. IMPRESSION: 1. Gallbladder wall thickening and pericholecystic fluid. Correlate clinically for acute acalculous cholecystitis. 2. At least small volume simple free fluid ascites. 3. At least trace volume right pleural effusion. Electronically Signed   By: Tish Frederickson M.D.   On: 05/07/2022 20:24    Microbiology: Recent Results (from the past 240 hour(s))  Blood culture (routine x 2)     Status: None (Preliminary result)   Collection Time: 05/30/22  4:09 PM   Specimen: BLOOD  Result  Value Ref Range Status   Specimen Description BLOOD SITE NOT SPECIFIED  Final   Special Requests   Final    BOTTLES DRAWN AEROBIC AND ANAEROBIC Blood Culture adequate volume   Culture   Final    NO GROWTH 4 DAYS Performed at [facility], [address] [place], [place] [address]    Report Status PENDING  Incomplete  Blood culture (routine x 2)     Status: None (Preliminary result)   Collection Time: 05/31/22  6:03 AM   Specimen: BLOOD  Result Value Ref Range Status   Specimen Description BLOOD RIGHT ANTECUBITAL  Final   Special Requests   Final    BOTTLES DRAWN AEROBIC AND ANAEROBIC Blood Culture results may not be optimal due to an inadequate volume of blood received in culture bottles   Culture   Final    NO GROWTH 3 DAYS Performed at [facility], [address], [place], [place] [address]    Report Status PENDING  Incomplete     Labs: Basic Metabolic Panel: Recent Labs  Lab 05/30/22 1607 05/31/22 0603 06/01/22 0030 06/02/22 0053 06/02/22 1453 06/03/22 0045 06/04/22 0059  NA 142 136 138 140 140  140 139 139  K 2.4* 2.7* 3.4* 4.0 3.7  3.7 3.3* 4.0  CL 104 102 104 102  --  98 101  CO2 28 25 25 30   --  31 28  GLUCOSE 126* 126* 110* 104*  --  107* 115*  BUN 6 6 10 16   --  12 15  CREATININE 0.70 0.67 1.13* 0.86  --  0.79 1.01*  CALCIUM 9.1 8.1* 8.4* 8.6*  --  8.6* 8.9  MG 1.4*  --  2.2 1.5*  --  1.9  --    Liver Function Tests: Recent Labs  Lab 05/30/22 1607 06/04/22 0059  AST 34 43*  ALT 95* 47*  ALKPHOS 146* 136*  BILITOT 0.2* 0.2*  PROT 6.0* 5.4*  ALBUMIN 3.0* 2.8*   No results for input(s): "LIPASE", "AMYLASE" in the last 168 hours. No results for input(s): "AMMONIA" in the last 168 hours. CBC: Recent Labs  Lab 05/30/22 1607 05/31/22 0603 06/02/22 1453  WBC 7.0 8.3  --   HGB 10.0* 9.9* 9.5*  9.5*  HCT 31.8* 32.5* 28.0*  28.0*  MCV 98.8 101.9*  --   PLT 565* 601*  --    Cardiac Enzymes: No results for input(s): "CKTOTAL",  "CKMB", "CKMBINDEX", "TROPONINI" in the last 168 hours. BNP: BNP (last 3 results) Recent Labs    05/30/22 1607  BNP 4,465.1*  ProBNP (last 3 results) No results for input(s): "PROBNP" in the last 8760 hours.  CBG: No results for input(s): "GLUCAP" in the last 168 hours.     Signed:  Zannie Cove MD.  [facility] 06/04/2022, 9:28 AM

## 2022-06-04 NOTE — Progress Notes (Signed)
Pt has orders to be discharged. Discharge instructions given and pt has no additional questions at this time. Medication regimen reviewed and pt educated. Pt verbalized understanding and has no additional questions. Telemetry box removed. IV removed and site in good condition. Pt stable and waiting for transportation, scale and TOC meds.

## 2022-06-04 NOTE — Progress Notes (Addendum)
Patient ID: Jamie Shields, female   DOB: Dec 06, 1980, 41 y.o.   MRN: [id number]     Advanced Heart Failure Rounding Note  PCP-Cardiologist: None   Subjective:    Complaining of pain around saline lock on R upper arm and R and LLE pain. Asking for pain medications.   TEE : EF 30%, mild RV dysfunction, trileaflet aortic valve with mild-moderate central AI and no AS.   RHC/LHC:  Coronary Findings  Diagnostic Dominance: Right Left Main  Vessel was injected. Vessel is normal in caliber. Vessel is angiographically normal.    Left Anterior Descending  Vessel was injected. Vessel is normal in caliber. Vessel is angiographically normal.    Left Circumflex  Vessel was injected. Vessel is normal in caliber. Vessel is angiographically normal.    Right Coronary Artery  Vessel was injected. Vessel is normal in caliber. Vessel is angiographically normal.    Intervention   No interventions have been documented.   Right Heart  Right Heart Pressures RHC Procedural Findings: Hemodynamics (mmHg) RA mean 13 RV 44/16 PA 42/23,mean 33 PCWP mean 19 LV 124/31 AO 121/81  Oxygen saturations: PA 60% AO 98%  Cardiac Output (Fick) 4.04  Cardiac Index (Fick) 2.6 PVR 3.4 WU    Objective:   Weight Range: 45.8 kg Body mass index is 16.29 kg/m.   Vital Signs:   Temp:  [97.5 F (36.4 C)-98.5 F (36.9 C)] 98.4 F (36.9 C) (11/09 0530) Pulse Rate:  [97-109] 103 (11/09 0530) Resp:  [15-20] 17 (11/09 0530) BP: (102-120)/(67-89) 119/78 (11/09 0530) SpO2:  [93 %-100 %] 95 % (11/09 0530) Weight:  [45.8 kg] 45.8 kg (11/09 0530) Last BM Date : 06/02/22  Weight change: Filed Weights   06/02/22 0332 06/03/22 0513 06/04/22 0530  Weight: 49.7 kg 45.9 kg 45.8 kg    Intake/Output:   Intake/Output Summary (Last 24 hours) at 06/04/2022 0658 Last data filed at 06/04/2022 0534 Gross per 24 hour  Intake 800 ml  Output 2050 ml  Net -1250 ml      Physical Exam    General:  No resp  difficulty HEENT: normal Neck: supple. no JVD. Carotids 2+ bilat; no bruits. No lymphadenopathy or thryomegaly appreciated. Cor: PMI nondisplaced. Regular rate & rhythm. No rubs, gallops or murmurs. Lungs: clear Abdomen: soft, nontender, nondistended. No hepatosplenomegaly. No bruits or masses. Good bowel sounds. Extremities: no cyanosis, clubbing, rash, edema. RUE pain at saline lock. LUE mid line. R and LLE unna boots.  Neuro: alert & orientedx3, cranial nerves grossly intact. moves all 4 extremities w/o difficulty. Affect pleasant   Telemetry  SR -St 90-100s   EKG    N/A  Labs    CBC Recent Labs    06/02/22 1453  HGB 9.5*  9.5*  HCT 28.0*  28.0*   Basic Metabolic Panel Recent Labs    50/93/26 0053 06/02/22 1453 06/03/22 0045 06/04/22 0059  NA 140   < > 139 139  K 4.0   < > 3.3* 4.0  CL 102  --  98 101  CO2 30  --  31 28  GLUCOSE 104*  --  107* 115*  BUN 16  --  12 15  CREATININE 0.86  --  0.79 1.01*  CALCIUM 8.6*  --  8.6* 8.9  MG 1.5*  --  1.9  --    < > = values in this interval not displayed.   Liver Function Tests Recent Labs    06/04/22 0059  AST 43*  ALT  47*  ALKPHOS 136*  BILITOT 0.2*  PROT 5.4*  ALBUMIN 2.8*    No results for input(s): "LIPASE", "AMYLASE" in the last 72 hours. Cardiac Enzymes No results for input(s): "CKTOTAL", "CKMB", "CKMBINDEX", "TROPONINI" in the last 72 hours.  BNP: BNP (last 3 results) Recent Labs    05/30/22 1607  BNP 4,465.1*    ProBNP (last 3 results) No results for input(s): "PROBNP" in the last 8760 hours.   D-Dimer No results for input(s): "DDIMER" in the last 72 hours. Hemoglobin A1C No results for input(s): "HGBA1C" in the last 72 hours. Fasting Lipid Panel No results for input(s): "CHOL", "HDL", "LDLCALC", "TRIG", "CHOLHDL", "LDLDIRECT" in the last 72 hours. Thyroid Function Tests No results for input(s): "TSH", "T4TOTAL", "T3FREE", "THYROIDAB" in the last 72 hours.  Invalid input(s):  "FREET3"  Other results:   Imaging    No results found.   Medications:     Scheduled Medications:  ascorbic acid  500 mg Oral Daily   Chlorhexidine Gluconate Cloth  6 each Topical Daily   vitamin D3  1,000 Units Oral Daily   dapagliflozin propanediol  10 mg Oral Daily   digoxin  0.125 mg Oral Daily   enoxaparin (LOVENOX) injection  40 mg Subcutaneous Q24H   folic acid  1 mg Oral Daily   furosemide  20 mg Oral Daily   levETIRAcetam  500 mg Oral BID   mirtazapine  15 mg Oral QHS   multivitamin with minerals  1 tablet Oral Daily   pantoprazole  40 mg Oral Daily   sacubitril-valsartan  1 tablet Oral BID   sodium chloride flush  10-40 mL Intracatheter Q12H   sodium chloride flush  3 mL Intravenous Q12H   sodium chloride flush  3 mL Intravenous Q12H   sodium chloride flush  3 mL Intravenous Q12H   spironolactone  25 mg Oral Daily   vitamin A  10,000 Units Oral Daily    Infusions:  sodium chloride     sodium chloride     thiamine (VITAMIN B1) injection 250 mg (06/03/22 2246)    PRN Medications: sodium chloride, sodium chloride, acetaminophen, diphenhydrAMINE, hydrOXYzine, LORazepam, LORazepam, morphine injection, ondansetron (ZOFRAN) IV, sodium chloride flush, sodium chloride flush, sodium chloride flush    Patient Profile   Ms. Pelphrey is a 40 y.o. female with PMH of substance abuse, chronic bronchitis, hypovolemic shock, protein calorie malnutrition, seizures, bipolar disorder, and substance abuse. AHF team asked to see for acute combined systolic and diastolic CHF.    Assessment/Plan   1. Acute systolic CHF: Nonischemic cardiomyopathy.  Echo this admission with EF 30-35%, mild RV dysfunction, small-moderate pericardial effusion, probably moderate AI, and dilated IVC. This is surprising given prior echo in 10/23 showed EF 70-75%, normal RV, and possible severe AI (not well-visualized).  BNP was markedly elevated. LHC/RHC yesterday showed mildly elevated filling  pressures, preserved cardiac output, and no significant coronary disease.  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  Some form of stress/Takotsubo-type cardiomyopathy is also possible.    - Volume status stable. Continue Lasix 20 mg daily.  - Add coreg 3.125 mg twice a day.  - Continue 0.125 digoxin daily.  - Continue spironolactone to 25 mg daily.  - Continue Farxiga 10 mg daily.  - Continue Entresto 24/26 bid, watch BP.  - Cardiac MRI ordered.   - Continue thiamine supplementation.  - Renal function stable.  - Remove unna boots. Place ted hose.  2. Aortic insufficiency: ?Severe on 10/23 echo.  TEE  showed mild-moderate AI with trileaflet valve.  - Will check regurgitant fraction by MRI.  Results pending.  3. Psych: Anxiety, ?PTSD, ?bipolar disorder.  Suspect this plays a role in her malnutrition.  4. Protein Malnutrition: Per primary.  5. ?Adrenal insufficiency: Did not have full workup of this last admission but not on steroids and probably not adrenally insufficient.  6. Prior heavy ETOH: Says she quit > 6 months ago.  7. Prior IVDU: Listed in chart but she denies.  8. Replace K and Mg.   OK for d/c . F/U on the chart -Lasix 20 mg daily.  -Coreg 3.125 mg twice a day.  - Digoxin 0.125 digoxin daily.  - Spironolactone to 25 mg daily.  - Farxiga 10 mg daily.  - Entresto 24/26 bid  Amy Clegg NP-C  06/04/2022 6:58 AM  Patient seen with NP, agree with the above note.   Stable today, creatinine 1.01.  SBP 100s-110s.  Breathing improved.   General: NAD, thin.  Neck: No JVD, no thyromegaly or thyroid nodule.  Lungs: Clear to auscultation bilaterally with normal respiratory effort. CV: Nondisplaced PMI.  Heart regular S1/S2, no S3/S4, no murmur.  1+ ankle edema.  No carotid bruit.   Abdomen: Soft, nontender, no hepatosplenomegaly, no distention.  Skin: Intact without lesions or rashes.  Neurologic: Alert and oriented x 3.  Psych: Normal  affect. Extremities: No clubbing or cyanosis.  HEENT: Normal.   Nonischemic cardiomyopathy, ?stress cardiomyopathy. Weight down total of about 15 lbs, looks euvolemic.  - She can go home today on the medication regimen noted above.  - Cardiac MRI done but not sent to reading station.  Will try to get this pushed over and will take a look.  - Close followup in CHF clinic.   Marca Ancona 06/04/2022 9:57 AM

## 2022-06-04 NOTE — TOC CM/SW Note (Signed)
HF TOC CM provided pt with brochure for [facility] for appt on 11/22 at 830 am. Provided pt with scale, medication holder bag, and pill planner. Isidoro Donning RN3 CCM, Heart Failure TOC CM [phone] Exa Bomba RN3 CCM, Heart Failure TOC CM [phone]

## 2022-06-04 NOTE — Telephone Encounter (Signed)
Patient Advocate Encounter  Prior Authorization for Farxiga 10MG  tablets has been approved.    PA# Key: [id number] Effective dates: 06/04/2022 through 06/04/2023       13/02/2023, CPhT Pharmacy Patient Advocate Specialist [facility] Patient Advocate Team Direct Number: [phone]  Fax: [phone]

## 2022-06-05 ENCOUNTER — Encounter (HOSPITAL_COMMUNITY): Payer: Self-pay | Admitting: Cardiology

## 2022-06-05 LAB — CULTURE, BLOOD (ROUTINE X 2): Culture: NO GROWTH

## 2022-06-08 ENCOUNTER — Ambulatory Visit (HOSPITAL_BASED_OUTPATIENT_CLINIC_OR_DEPARTMENT_OTHER): Payer: Medicaid Other | Admitting: Cardiology

## 2022-06-11 ENCOUNTER — Encounter (HOSPITAL_COMMUNITY): Payer: Medicaid Other

## 2022-06-16 ENCOUNTER — Telehealth (HOSPITAL_COMMUNITY): Payer: Self-pay

## 2022-06-16 NOTE — Telephone Encounter (Signed)
Pt returned phone call and stated that she is not interested in the cardiac rehab program do to transportation. Closed referral.

## 2022-06-17 ENCOUNTER — Inpatient Hospital Stay: Payer: Medicaid Other | Admitting: Family Medicine

## 2022-06-19 ENCOUNTER — Other Ambulatory Visit (HOSPITAL_COMMUNITY): Payer: Self-pay

## 2022-06-26 ENCOUNTER — Encounter: Payer: Self-pay | Admitting: Nurse Practitioner

## 2022-06-26 ENCOUNTER — Ambulatory Visit (INDEPENDENT_AMBULATORY_CARE_PROVIDER_SITE_OTHER): Payer: Medicaid Other | Admitting: Nurse Practitioner

## 2022-06-26 VITALS — BP 106/62 | Temp 97.8°F | Ht 66.0 in | Wt 92.0 lb

## 2022-06-26 DIAGNOSIS — I5041 Acute combined systolic (congestive) and diastolic (congestive) heart failure: Secondary | ICD-10-CM

## 2022-06-26 DIAGNOSIS — Z419 Encounter for procedure for purposes other than remedying health state, unspecified: Secondary | ICD-10-CM | POA: Diagnosis not present

## 2022-06-26 MED ORDER — MIRTAZAPINE 15 MG PO TABS: 15.0000 mg | ORAL_TABLET | Freq: Every day | ORAL | 0 refills | Status: DC

## 2022-06-26 MED ORDER — CARVEDILOL 3.125 MG PO TABS: 3.1250 mg | ORAL_TABLET | Freq: Two times a day (BID) | ORAL | 0 refills | Status: DC

## 2022-06-26 MED ORDER — DIGOXIN 125 MCG PO TABS: 0.1250 mg | ORAL_TABLET | Freq: Every day | ORAL | 0 refills | Status: DC

## 2022-06-26 MED ORDER — SPIRONOLACTONE 25 MG PO TABS: 25.0000 mg | ORAL_TABLET | Freq: Every day | ORAL | 0 refills | Status: DC

## 2022-06-26 MED ORDER — THIAMINE HCL 100 MG PO TABS: 100.0000 mg | ORAL_TABLET | Freq: Every day | ORAL | 1 refills | Status: DC

## 2022-06-26 MED ORDER — FUROSEMIDE 20 MG PO TABS: 20.0000 mg | ORAL_TABLET | Freq: Every day | ORAL | 1 refills | Status: DC

## 2022-06-26 NOTE — Progress Notes (Signed)
@Patient  ID: , female    DOB: Dec 10, 1980, 41 y.o.   MRN: [id number]  Chief Complaint  Patient presents with   hospital f/u    Pt stated--swollen legs -but doing much better. Pt denied pain.    Referring provider: No ref. provider found   HPI  Patient presents today for hospital follow-up.  She was admitted on 05/30/2022.  She states that she is doing much better since discharge.  She is scheduled for CHF clinic next Friday.  She does need refills on her medications. Denies f/c/s, n/v/d, hemoptysis, PND, leg swelling Denies chest pain or edema       Allergies  Allergen Reactions   Cucumber Extract Anaphylaxis   Keflex [Cephalexin] Nausea And Vomiting   Macrobid [Nitrofurantoin Monohydrate Macrocrystals] Hives and Swelling   Naproxen Nausea And Vomiting   Penicillins Nausea And Vomiting    Heavy vomitting Did PCN reaction causing immediate rash, facial/tongue/throat swelling, SOB or lightheadedness with hypotension: No swelling, but im not sure if i got a rash as i was red all over already Did PCN reaction causing severe rash involving mucus membranes or skin necrosis: No Did a PCN reaction that required hospitalization Yes went to the ED Has patient had a PCN reaction occurring within the last 10 years: No-childhood allergy If all of the above answers are "NO", then may proceed with   Tramadol Nausea And Vomiting   Watermelon Concentrate Diarrhea   Darvocet [Propoxyphene N-Acetaminophen] Nausea And Vomiting and Other (See Comments)    migraines   Flagyl [Metronidazole Hcl] Hives, Swelling and Rash   Ketorolac Nausea And Vomiting and Other (See Comments)    migraines   Ultram [Tramadol Hcl] Nausea And Vomiting and Other (See Comments)    migraines   Vicodin [Hydrocodone-Acetaminophen] Nausea And Vomiting and Other (See Comments)    migraines     There is no immunization history on file for this patient.  Past Medical History:  Diagnosis Date   Anxiety     Bipolar 1 disorder (HCC)    Bursitis of hip    Chronic bronchitis    Hip joint pain    Seizures (HCC)    Shock (HCC)    hypovolemic    Tobacco History: Social History   Tobacco Use  Smoking Status Former   Packs/day: 0.50   Types: Cigarettes  Smokeless Tobacco Never  Tobacco Comments   Former smoker 07/03/22   Counseling given: Not Answered Tobacco comments: Former smoker 07/03/22   Outpatient Encounter Medications as of 06/26/2022  Medication Sig   ascorbic acid (VITAMIN C) 250 MG tablet Take 1 tablet (250 mg total) by mouth 2 (two) times daily.   cholecalciferol (CHOLECALCIFEROL) 25 MCG tablet Take 1 tablet (1,000 Units total) by mouth daily.   folic acid (FOLVITE) 1 MG tablet Take 1 tablet (1 mg total) by mouth daily.   levETIRAcetam (KEPPRA) 500 MG tablet Take 1 tablet (500 mg total) by mouth 2 (two) times daily.   Multiple Vitamins-Minerals (MULTIVITAMIN WITH MINERALS) tablet Take 1 tablet by mouth daily.   pantoprazole (PROTONIX) 40 MG tablet Take 1 tablet (40 mg total) by mouth daily.   vitamin A 3 MG (10000 UNITS) capsule Take 1 capsule (10,000 Units total) by mouth daily.   [DISCONTINUED] carvedilol (COREG) 3.125 MG tablet Take 1 tablet (3.125 mg total) by mouth 2 (two) times daily with a meal.   [DISCONTINUED] dapagliflozin propanediol (FARXIGA) 10 MG TABS tablet Take 1 tablet (10 mg total) by mouth  daily.   [DISCONTINUED] digoxin (LANOXIN) 0.125 MG tablet Take 1 tablet (0.125 mg total) by mouth daily.   [DISCONTINUED] furosemide (LASIX) 20 MG tablet Take 1 tablet (20 mg total) by mouth daily.   [DISCONTINUED] sacubitril-valsartan (ENTRESTO) 24-26 MG Take 1 tablet by mouth 2 (two) times daily.   [DISCONTINUED] spironolactone (ALDACTONE) 25 MG tablet Take 1 tablet (25 mg total) by mouth daily.   [DISCONTINUED] thiamine (VITAMIN B1) 100 MG tablet Take 1 tablet (100 mg total) by mouth daily.   mirtazapine (REMERON) 15 MG tablet Take 1 tablet (15 mg total) by mouth at  bedtime.   thiamine (VITAMIN B1) 100 MG tablet Take 1 tablet (100 mg total) by mouth daily.   [DISCONTINUED] carvedilol (COREG) 3.125 MG tablet Take 1 tablet (3.125 mg total) by mouth 2 (two) times daily with a meal.   [DISCONTINUED] digoxin (LANOXIN) 0.125 MG tablet Take 1 tablet (0.125 mg total) by mouth daily.   [DISCONTINUED] furosemide (LASIX) 20 MG tablet Take 1 tablet (20 mg total) by mouth daily.   [DISCONTINUED] mirtazapine (REMERON) 15 MG tablet Take 1 tablet (15 mg total) by mouth at bedtime.   [DISCONTINUED] spironolactone (ALDACTONE) 25 MG tablet Take 1 tablet (25 mg total) by mouth daily.   No facility-administered encounter medications on file as of 06/26/2022.     Review of Systems  Review of Systems  Constitutional: Negative.   HENT: Negative.    Cardiovascular: Negative.   Gastrointestinal: Negative.   Allergic/Immunologic: Negative.   Neurological: Negative.   Psychiatric/Behavioral: Negative.         Physical Exam  BP 106/62   Temp 97.8 F (36.6 C)   Ht 5\' 6"  (1.676 m)   Wt 92 lb (41.7 kg)   LMP  (LMP Unknown)   SpO2 98%   BMI 14.85 kg/m   Wt Readings from Last 5 Encounters:  07/03/22 97 lb 3.2 oz (44.1 kg)  06/26/22 92 lb (41.7 kg)  06/04/22 100 lb 14.4 oz (45.8 kg)  05/14/22 137 lb 5.6 oz (62.3 kg)  05/18/16 100 lb (45.4 kg)     Physical Exam Vitals and nursing note reviewed.  Constitutional:      General: She is not in acute distress.    Appearance: She is well-developed.  Cardiovascular:     Rate and Rhythm: Normal rate and regular rhythm.  Pulmonary:     Effort: Pulmonary effort is normal.     Breath sounds: Normal breath sounds.  Neurological:     Mental Status: She is alert and oriented to person, place, and time.      Lab Results:  CBC    Component Value Date/Time   WBC 7.3 06/26/2022 1438   WBC 8.3 05/31/2022 0603   RBC 3.94 06/26/2022 1438   RBC 3.19 (L) 05/31/2022 0603   HGB 12.3 06/26/2022 1438   HCT 37.1  06/26/2022 1438   PLT 306 06/26/2022 1438   MCV 94 06/26/2022 1438   MCH 31.2 06/26/2022 1438   MCH 31.0 05/31/2022 0603   MCHC 33.2 06/26/2022 1438   MCHC 30.5 05/31/2022 0603   RDW 14.3 06/26/2022 1438   LYMPHSABS 2.0 05/14/2022 0420   MONOABS 0.4 05/14/2022 0420   EOSABS 0.1 05/14/2022 0420   BASOSABS 0.0 05/14/2022 0420    BMET    Component Value Date/Time   NA 137 07/03/2022 1529   NA 139 06/26/2022 1438   K 3.9 07/03/2022 1529   CL 106 07/03/2022 1529   CO2 23 07/03/2022 1529  GLUCOSE 83 07/03/2022 1529   BUN 7 07/03/2022 1529   BUN 6 06/26/2022 1438   CREATININE 0.70 07/03/2022 1529   CALCIUM 9.7 07/03/2022 1529   GFRNONAA >60 07/03/2022 1529   GFRAA >60 04/28/2015 1240    BNP    Component Value Date/Time   BNP 87.5 07/03/2022 1529    ProBNP No results found for: "PROBNP"  Imaging: No results found.   Assessment & Plan:   Acute combined systolic and diastolic CHF, NYHA class 1 (HCC) - CBC - Comprehensive metabolic panel - Ambulatory referral to Psychiatry - carvedilol (COREG) 3.125 MG tablet; Take 1 tablet (3.125 mg total) by mouth 2 (two) times daily with a meal.  Dispense: 30 tablet; Refill: 0 - mirtazapine (REMERON) 15 MG tablet; Take 1 tablet (15 mg total) by mouth at bedtime.  Dispense: 30 tablet; Refill: 0 - spironolactone (ALDACTONE) 25 MG tablet; Take 1 tablet (25 mg total) by mouth daily.  Dispense: 30 tablet; Refill: 0 - furosemide (LASIX) 20 MG tablet; Take 1 tablet (20 mg total) by mouth daily.  Dispense: 30 tablet; Refill: 1 - thiamine (VITAMIN B1) 100 MG tablet; Take 1 tablet (100 mg total) by mouth daily.  Dispense: 30 tablet; Refill: 1 - digoxin (LANOXIN) 0.125 MG tablet; Take 1 tablet (0.125 mg total) by mouth daily.  Dispense: 30 tablet; Refill: 0   Follow up:  Follow up in 3 months or sooner     Ivonne Andrew, NP 07/13/2022

## 2022-06-26 NOTE — Patient Instructions (Addendum)
1. Acute combined systolic and diastolic CHF, NYHA class 1 (HCC)  - CBC - Comprehensive metabolic panel - Ambulatory referral to Psychiatry - carvedilol (COREG) 3.125 MG tablet; Take 1 tablet (3.125 mg total) by mouth 2 (two) times daily with a meal.  Dispense: 30 tablet; Refill: 0 - mirtazapine (REMERON) 15 MG tablet; Take 1 tablet (15 mg total) by mouth at bedtime.  Dispense: 30 tablet; Refill: 0 - spironolactone (ALDACTONE) 25 MG tablet; Take 1 tablet (25 mg total) by mouth daily.  Dispense: 30 tablet; Refill: 0 - furosemide (LASIX) 20 MG tablet; Take 1 tablet (20 mg total) by mouth daily.  Dispense: 30 tablet; Refill: 1 - thiamine (VITAMIN B1) 100 MG tablet; Take 1 tablet (100 mg total) by mouth daily.  Dispense: 30 tablet; Refill: 1 - digoxin (LANOXIN) 0.125 MG tablet; Take 1 tablet (0.125 mg total) by mouth daily.  Dispense: 30 tablet; Refill: 0   Follow up:  Follow up in 3 months or sooner

## 2022-06-27 LAB — COMPREHENSIVE METABOLIC PANEL
ALT: 22 IU/L (ref 0–32)
AST: 40 IU/L (ref 0–40)
Albumin/Globulin Ratio: 2 (ref 1.2–2.2)
Albumin: 4.9 g/dL (ref 3.9–4.9)
Alkaline Phosphatase: 87 IU/L (ref 44–121)
BUN/Creatinine Ratio: 9 (ref 9–23)
BUN: 6 mg/dL (ref 6–24)
Bilirubin Total: 0.3 mg/dL (ref 0.0–1.2)
CO2: 21 mmol/L (ref 20–29)
Calcium: 9.9 mg/dL (ref 8.7–10.2)
Chloride: 100 mmol/L (ref 96–106)
Creatinine, Ser: 0.69 mg/dL (ref 0.57–1.00)
Globulin, Total: 2.4 g/dL (ref 1.5–4.5)
Glucose: 89 mg/dL (ref 70–99)
Potassium: 3.8 mmol/L (ref 3.5–5.2)
Sodium: 139 mmol/L (ref 134–144)
Total Protein: 7.3 g/dL (ref 6.0–8.5)
eGFR: 112 mL/min/{1.73_m2} (ref >59)

## 2022-06-27 LAB — CBC
Hematocrit: 37.1 % (ref 34.0–46.6)
Hemoglobin: 12.3 g/dL (ref 11.1–15.9)
MCH: 31.2 pg (ref 26.6–33.0)
MCHC: 33.2 g/dL (ref 31.5–35.7)
MCV: 94 fL (ref 79–97)
Platelets: 306 10*3/uL (ref 150–450)
RBC: 3.94 x10E6/uL (ref 3.77–5.28)
RDW: 14.3 % (ref 11.7–15.4)
WBC: 7.3 10*3/uL (ref 3.4–10.8)

## 2022-06-29 ENCOUNTER — Other Ambulatory Visit (HOSPITAL_COMMUNITY): Payer: Self-pay

## 2022-06-29 NOTE — Progress Notes (Signed)
ADVANCED HF CLINIC CONSULT NOTE  Primary Care: Mickle Mallory, NP HF Cardiologist: Dr. Shirlee Latch  HPI: Jamie Shields is a 41 y.o. female with PMH of substance abuse, chronic bronchitis, hypovolemic shock, protein calorie malnutrition, seizures, bipolar disorder, and substance abuse and new diagnosis of systolic heart fialure.    She was admitted 10/23 for hypovolemic shock requiring IVF, pressors, midodrine and stress dose corticosteroids.    Admitted 11/23 with acute combined systolic and diastolic heart failure. Echo showed EF 30-35%, mild RV dysfunction, small-moderate pericardial effusion, probably moderate AI, and dilated IVC. Echo 1 month prior showed EF 70-75%, normal RV and possible severe AI. Diuresed with IV lasix, underwent L/RHC showing mildly elevated filling pressures, preserved CO and no significant CAD. Felt thiamine deficiency/Beriberi syndrome or a form of stress/Takotsubo CM possible. GDMT titrated. Underwent TEE to better assess AI, which showed mild to moderate AI. Cardiac MRI showed LVEF 38%, RVEF 41%, no LGE, ECV 38%. Discharged home, weight 100 lbs at discharge (down 15 lbs total during hospitalization)  Today she returns for post hospital HF follow up. Overall feeling fine. Occasional positional lightheadedness, no falls. She is not short of breath with walking on flat ground, playing with her dogs or grocery shopping. Denies palpitations, CP,,edema, or PND/Orthopnea. Appetite ok. No fever or chills. Weight at home 101 pounds. Taking all medications. Denies any history or active use of drugs. Occasionally vapes, quit smoking cigarettes 19'. Used to drink 1 pack of Budlight a day up until 6 months ago, has since quit. Strong cardiac family history. Dad has had MI, mom with MI and HTN, maternal grandfather had an aneurysm and multiple MIs, and paternal grandfather had CABGx3 and multiple MIs. No history in siblings or children.  ECG (personally reviewed): NSR 93 bpm  Labs  (12/23): K 3.8, creatinine 0.69  Cardiac Studies - TEE (11/23) : EF 30%, mild RV dysfunction, trileaflet aortic valve with mild-moderate central AI and no AS.   - R/LHC (11/23): no significant CAD, RA mean 13, PA 42/23 mean 33, PCWP mean 19, CO/CI (Fick) 4.04/2.6, PVR 3.4 WU  - Echo (11/23): EF 30-35%, LV with global hypokinesis, RV mildly enlarged, small to mod pericardial effusion, mild MR, mild to mod TR, Mod AV regurgitation. Pulmonic valve regurgitation Mod.   - Echo (10/23): EF 70-75%, no MR, mild TR, suspicious for severe aortic insufficiency. Severe aortic regurgitation   Review of Systems: [y] = yes, [ ]  = no   General: Weight gain [ ] ; Weight loss [ ] ; Anorexia [place].Etienne ]; Fatigue [ ] ; Fever [ ] ; Chills [ ] ; Weakness [ ]   Cardiac: Chest pain/pressure [ ] ; Resting SOB [ ] ; Exertional SOB [ ] ; Orthopnea [ ] ; Pedal Edema [ ] ; Palpitations [ ] ; Syncope [ ] ; Presyncope [ ] ; Paroxysmal nocturnal dyspnea[ ]   Pulmonary: Cough [ ] ; Wheezing[ ] ; Hemoptysis[ ] ; Sputum [ ] ; Snoring [ ]   GI: Vomiting[ ] ; Dysphagia[ ] ; Melena[ ] ; Hematochezia [ ] ; Heartburn[ ] ; Abdominal pain [ ] ; Constipation [ ] ; Diarrhea [ ] ; BRBPR [ ]   GU: Hematuria[ ] ; Dysuria [ ] ; Nocturia[ ]   Vascular: Pain in legs with walking [ ] ; Pain in feet with lying flat [ ] ; Non-healing sores [ ] ; Stroke [ ] ; TIA [ ] ; Slurred speech [ ] ;  Neuro: Headaches[ ] ; Vertigo[ ] ; Seizures[ ] ; Paresthesias[ ] ;Blurred vision [ ] ; Diplopia [ ] ; Vision changes [ ]   Ortho/Skin: Arthritis [ ] ; Joint pain [ ] ; Muscle pain [ ] ; Joint swelling [ ] ; Back Pain [ ] ;  Rash [ ]   Psych: Depression[y ]; Anxiety[y ]  Heme: Bleeding problems [ ] ; Clotting disorders [ ] ; Anemia [ ]   Endocrine: Diabetes [ ] ; Thyroid dysfunction[ ]   Past Medical History:  Diagnosis Date   Anxiety    Bipolar 1 disorder (HCC)    Bursitis of hip    Chronic bronchitis    Hip joint pain    Seizures (HCC)    Shock (HCC)    hypovolemic   Current Outpatient Medications   Medication Sig Dispense Refill   ascorbic acid (VITAMIN C) 250 MG tablet Take 1 tablet (250 mg total) by mouth 2 (two) times daily. 180 tablet 0   carvedilol (COREG) 3.125 MG tablet Take 1 tablet (3.125 mg total) by mouth 2 (two) times daily with a meal. 30 tablet 0   cholecalciferol (CHOLECALCIFEROL) 25 MCG tablet Take 1 tablet (1,000 Units total) by mouth daily. 30 tablet 2   dapagliflozin propanediol (FARXIGA) 10 MG TABS tablet Take 1 tablet (10 mg total) by mouth daily. 30 tablet 0   digoxin (LANOXIN) 0.125 MG tablet Take 1 tablet (0.125 mg total) by mouth daily. 30 tablet 0   folic acid (FOLVITE) 1 MG tablet Take 1 tablet (1 mg total) by mouth daily. 90 tablet 0   furosemide (LASIX) 20 MG tablet Take 1 tablet (20 mg total) by mouth daily. 30 tablet 1   levETIRAcetam (KEPPRA) 500 MG tablet Take 1 tablet (500 mg total) by mouth 2 (two) times daily. 60 tablet 0   mirtazapine (REMERON) 15 MG tablet Take 1 tablet (15 mg total) by mouth at bedtime. 30 tablet 0   Multiple Vitamins-Minerals (MULTIVITAMIN WITH MINERALS) tablet Take 1 tablet by mouth daily. 90 tablet 0   pantoprazole (PROTONIX) 40 MG tablet Take 1 tablet (40 mg total) by mouth daily. 30 tablet 2   sacubitril-valsartan (ENTRESTO) 24-26 MG Take 1 tablet by mouth 2 (two) times daily. 60 tablet 0   spironolactone (ALDACTONE) 25 MG tablet Take 1 tablet (25 mg total) by mouth daily. 30 tablet 0   thiamine (VITAMIN B1) 100 MG tablet Take 1 tablet (100 mg total) by mouth daily. 30 tablet 1   vitamin A 3 MG (10000 UNITS) capsule Take 1 capsule (10,000 Units total) by mouth daily. 90 capsule 0   No current facility-administered medications for this encounter.   Allergies  Allergen Reactions   Cucumber Extract Anaphylaxis   Keflex [Cephalexin] Nausea And Vomiting   Macrobid [Nitrofurantoin Monohydrate Macrocrystals] Hives and Swelling   Naproxen Nausea And Vomiting   Penicillins Nausea And Vomiting    Heavy vomitting Did PCN reaction  causing immediate rash, facial/tongue/throat swelling, SOB or lightheadedness with hypotension: No swelling, but im not sure if i got a rash as i was red all over already Did PCN reaction causing severe rash involving mucus membranes or skin necrosis: No Did a PCN reaction that required hospitalization Yes went to the ED Has patient had a PCN reaction occurring within the last 10 years: No-childhood allergy If all of the above answers are "NO", then may proceed with   Tramadol Nausea And Vomiting   Watermelon Concentrate Diarrhea   Darvocet [Propoxyphene N-Acetaminophen] Nausea And Vomiting and Other (See Comments)    migraines   Flagyl [Metronidazole Hcl] Hives, Swelling and Rash   Ketorolac Nausea And Vomiting and Other (See Comments)    migraines   Ultram [Tramadol Hcl] Nausea And Vomiting and Other (See Comments)    migraines   Vicodin [Hydrocodone-Acetaminophen] Nausea And  Vomiting and Other (See Comments)    migraines   Social History   Socioeconomic History   Marital status: Legally Separated    Spouse name: Not on file   Number of children: Not on file   Years of education: Not on file   Highest education level: Not on file  Occupational History   Not on file  Tobacco Use   Smoking status: Former    Packs/day: 0.50    Types: Cigarettes   Smokeless tobacco: Never   Tobacco comments:    Former smoker 07/03/22  Vaping Use   Vaping Use: Some days  Substance and Sexual Activity   Alcohol use: Yes    Comment: rarely   Drug use: No   Sexual activity: Never    Birth control/protection: None  Other Topics Concern   Not on file  Social History Narrative   Not on file   Social Determinants of Health   Financial Resource Strain: Low Risk  (06/03/2022)   Overall Financial Resource Strain (CARDIA)    Difficulty of Paying Living Expenses: Not very hard  Food Insecurity: No Food Insecurity (06/03/2022)   Hunger Vital Sign    Worried About Running Out of Food in the Last  Year: Never true    Ran Out of Food in the Last Year: Never true  Transportation Needs: No Transportation Needs (06/03/2022)   PRAPARE - Administrator, Civil Service (Medical): No    Lack of Transportation (Non-Medical): No  Physical Activity: Not on file  Stress: Not on file  Social Connections: Not on file  Intimate Partner Violence: Not At Risk (06/03/2022)   Humiliation, Afraid, Rape, and Kick questionnaire    Fear of Current or Ex-Partner: No    Emotionally Abused: No    Physically Abused: No    Sexually Abused: No   Family History  Problem Relation Age of Onset   Heart attack Mother    Hypertension Mother    Heart attack Father    Heart attack Maternal Grandfather    Heart attack Paternal Grandfather    Diabetes Other    Cancer Other    BP 108/68   Pulse 98   Ht 5\' 6"  (1.676 m)   Wt 44.1 kg (97 lb 3.2 oz)   SpO2 95%   BMI 15.69 kg/m   Wt Readings from Last 3 Encounters:  07/03/22 44.1 kg (97 lb 3.2 oz)  06/26/22 41.7 kg (92 lb)  06/04/22 45.8 kg (100 lb 14.4 oz)   PHYSICAL EXAM: General:  NAD. No resp difficulty, cachectic HEENT: edentulous Neck: Supple. No JVD. Carotids 2+ bilat; no bruits. No lymphadenopathy or thryomegaly appreciated. Cor: PMI nondisplaced. Regular rate & rhythm. No rubs, gallops or murmurs. Lungs: Clear Abdomen: Soft, nontender, nondistended. No hepatosplenomegaly. No bruits or masses. Good bowel sounds. Extremities: No cyanosis, clubbing, rash, edema Neuro: Alert & oriented x 3, cranial nerves grossly intact. Moves all 4 extremities w/o difficulty. Affect pleasant.  ASSESSMENT & PLAN: 1. Chronic systolic CHF: Nonischemic cardiomyopathy.  Echo this admission with EF 30-35%, mild RV dysfunction, small-moderate pericardial effusion, probably moderate AI, and dilated IVC. This is surprising given prior echo in 10/23 showed EF 70-75%, normal RV, and possible severe AI (not well-visualized).  BNP was markedly elevated. LHC/RHC showed  mildly elevated filling pressures, preserved cardiac output, and no significant coronary disease.  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  Some form of stress/Takotsubo-type cardiomyopathy is also possible. cMRI 11/23 showed LVEF 38%, RVEF 41%,  no LGE, elevated ECV. GDMT limited by orthostatic symptoms. NYHA I-II, volume status stable.  - With dizziness, change Lasix to 20 mg PRN.  - Continue Coreg 3.125 mg bid. - Continue digoxin 0.125 mg daily. Check dig level today. - Continue spironolactone 25 mg daily. BMET and BNP today - Continue Farxiga 10 mg daily. No GU symptoms. - Continue Entresto 24/26 mg bid. - Continue thiamine supplementation.  - She declines cardiac rehab. - Repeat echo when GDMT optimized. 2. Aortic insufficiency: ?Severe on 10/23 echo.  TEE  showed mild-moderate AI with trileaflet valve.  - Moderate AI visually by cardiac MRI, but mild by regurgitant fraction.  3. Psych: Anxiety, ?PTSD, ?bipolar disorder.  Suspect this plays a role in her malnutrition.  - PCP has referred her to psychiatry. 4. Protein Malnutrition: Body mass index is 15.69 kg/m. - She says she has a good appetite. She is on mirtazapine. 5. ?Adrenal insufficiency: Did not have full workup of this last admission but not on steroids and probably not adrenally insufficient.  6. Prior heavy ETOH: Says she quit > 6 months ago.  7. Prior IVDU: Listed in chart but she denies.    Follow up in 3-4 weeks with APP and 3 months with Dr. Shirlee Latch + echo.  Prince Rome, FNP-BC 07/03/22

## 2022-07-03 ENCOUNTER — Ambulatory Visit (HOSPITAL_COMMUNITY)
Admission: RE | Admit: 2022-07-03 | Discharge: 2022-07-03 | Disposition: A | Discharge status: Home / Self Care | Payer: Medicaid Other | Source: Ambulatory Visit | Attending: Family Medicine | Admitting: Family Medicine

## 2022-07-03 ENCOUNTER — Encounter (HOSPITAL_COMMUNITY): Payer: Self-pay

## 2022-07-03 VITALS — BP 108/68 | HR 98 | Ht 66.0 in | Wt 97.2 lb

## 2022-07-03 DIAGNOSIS — R42 Dizziness and giddiness: Secondary | ICD-10-CM | POA: Diagnosis not present

## 2022-07-03 DIAGNOSIS — I351 Nonrheumatic aortic (valve) insufficiency: Secondary | ICD-10-CM

## 2022-07-03 DIAGNOSIS — F1011 Alcohol abuse, in remission: Secondary | ICD-10-CM | POA: Diagnosis not present

## 2022-07-03 DIAGNOSIS — I3139 Other pericardial effusion (noninflammatory): Secondary | ICD-10-CM | POA: Diagnosis not present

## 2022-07-03 DIAGNOSIS — E2740 Unspecified adrenocortical insufficiency: Secondary | ICD-10-CM | POA: Insufficient documentation

## 2022-07-03 DIAGNOSIS — Z79899 Other long term (current) drug therapy: Secondary | ICD-10-CM | POA: Diagnosis not present

## 2022-07-03 DIAGNOSIS — F419 Anxiety disorder, unspecified: Secondary | ICD-10-CM | POA: Diagnosis not present

## 2022-07-03 DIAGNOSIS — I428 Other cardiomyopathies: Secondary | ICD-10-CM | POA: Diagnosis not present

## 2022-07-03 DIAGNOSIS — I5022 Chronic systolic (congestive) heart failure: Secondary | ICD-10-CM | POA: Diagnosis not present

## 2022-07-03 DIAGNOSIS — F319 Bipolar disorder, unspecified: Secondary | ICD-10-CM | POA: Insufficient documentation

## 2022-07-03 DIAGNOSIS — I110 Hypertensive heart disease with heart failure: Secondary | ICD-10-CM | POA: Insufficient documentation

## 2022-07-03 DIAGNOSIS — Z681 Body mass index (BMI) 19 or less, adult: Secondary | ICD-10-CM | POA: Diagnosis not present

## 2022-07-03 DIAGNOSIS — E46 Unspecified protein-calorie malnutrition: Secondary | ICD-10-CM | POA: Insufficient documentation

## 2022-07-03 LAB — BASIC METABOLIC PANEL
Anion gap: 8 (ref 5–15)
BUN: 7 mg/dL (ref 6–20)
CO2: 23 mmol/L (ref 22–32)
Calcium: 9.7 mg/dL (ref 8.9–10.3)
Chloride: 106 mmol/L (ref 98–111)
Creatinine, Ser: 0.7 mg/dL (ref 0.44–1.00)
GFR, Estimated: >60 mL/min (ref >60)
Glucose, Bld: 83 mg/dL (ref 70–99)
Potassium: 3.9 mmol/L (ref 3.5–5.1)
Sodium: 137 mmol/L (ref 135–145)

## 2022-07-03 LAB — DIGOXIN LEVEL: Digoxin Level: 0.6 ng/mL — ABNORMAL LOW (ref 0.8–2.0)

## 2022-07-03 LAB — BRAIN NATRIURETIC PEPTIDE: B Natriuretic Peptide: 87.5 pg/mL (ref 0.0–100.0)

## 2022-07-03 MED ORDER — DAPAGLIFLOZIN PROPANEDIOL 10 MG PO TABS: 10.0000 mg | ORAL_TABLET | Freq: Every day | ORAL | 6 refills | Status: DC

## 2022-07-03 MED ORDER — SPIRONOLACTONE 25 MG PO TABS: 25.0000 mg | ORAL_TABLET | Freq: Every day | ORAL | 6 refills | Status: DC

## 2022-07-03 MED ORDER — CARVEDILOL 3.125 MG PO TABS: 3.1250 mg | ORAL_TABLET | Freq: Two times a day (BID) | ORAL | 6 refills | Status: DC

## 2022-07-03 MED ORDER — SACUBITRIL-VALSARTAN 24-26 MG PO TABS: 1.0000 | ORAL_TABLET | Freq: Two times a day (BID) | ORAL | 6 refills | Status: DC

## 2022-07-03 MED ORDER — FUROSEMIDE 20 MG PO TABS: 20.0000 mg | ORAL_TABLET | ORAL | 6 refills | Status: DC | PRN

## 2022-07-03 MED ORDER — DIGOXIN 125 MCG PO TABS: 0.1250 mg | ORAL_TABLET | Freq: Every day | ORAL | 6 refills | Status: DC

## 2022-07-03 NOTE — Patient Instructions (Signed)
CHANGE Lasix 20 mg one tab as needed  Labs today We will only contact you if something comes back abnormal or we need to make some changes. Otherwise no news is good news!  Your physician recommends that you schedule a follow-up appointment in: 3-4 weeks  in the [facility] and in 3-4 months with Dr Birdie Riddle  Your physician has requested that you have an echocardiogram. Echocardiography is a painless test that uses sound waves to create images of your heart. It provides your doctor with information about the size and shape of your heart and how well your heart's chambers and valves are working. This procedure takes approximately one hour. There are no restrictions for this procedure. Please do NOT wear cologne, perfume, aftershave, or lotions (deodorant is allowed). Please arrive 15 minutes prior to your appointment time.  Do the following things EVERYDAY: Weigh yourself in the morning before breakfast. Write it down and keep it in a log. Take your medicines as prescribed Eat low salt foods--Limit salt (sodium) to 2000 mg per day.  Stay as active as you can everyday Limit all fluids for the day to less than 2 liters  At the [facility], you and your health needs are our priority. As part of our continuing mission to provide you with exceptional heart care, we have created designated Provider Care Teams. These Care Teams include your primary Cardiologist (physician) and Advanced Practice Providers (APPs- Physician Assistants and Nurse Practitioners) who all work together to provide you with the care you need, when you need it.   You may see any of the following providers on your designated Care Team at your next follow up: Dr Arvilla Meres Dr Marca Ancona Dr. Marcos Eke, NP Robbie Lis, [place] [facility] [place], [place] Brynda Peon, NP Karle Plumber, PharmD   Please be sure to bring in all your medications  bottles to every appointment.

## 2022-07-03 NOTE — Addendum Note (Signed)
Encounter addended by: Jacklynn Ganong, FNP on: 07/03/2022 4:40 PM  Actions taken: Demographics modified

## 2022-07-13 ENCOUNTER — Encounter: Payer: Self-pay | Admitting: Nurse Practitioner

## 2022-07-13 NOTE — Assessment & Plan Note (Signed)
-   CBC - Comprehensive metabolic panel - Ambulatory referral to Psychiatry - carvedilol (COREG) 3.125 MG tablet; Take 1 tablet (3.125 mg total) by mouth 2 (two) times daily with a meal.  Dispense: 30 tablet; Refill: 0 - mirtazapine (REMERON) 15 MG tablet; Take 1 tablet (15 mg total) by mouth at bedtime.  Dispense: 30 tablet; Refill: 0 - spironolactone (ALDACTONE) 25 MG tablet; Take 1 tablet (25 mg total) by mouth daily.  Dispense: 30 tablet; Refill: 0 - furosemide (LASIX) 20 MG tablet; Take 1 tablet (20 mg total) by mouth daily.  Dispense: 30 tablet; Refill: 1 - thiamine (VITAMIN B1) 100 MG tablet; Take 1 tablet (100 mg total) by mouth daily.  Dispense: 30 tablet; Refill: 1 - digoxin (LANOXIN) 0.125 MG tablet; Take 1 tablet (0.125 mg total) by mouth daily.  Dispense: 30 tablet; Refill: 0   Follow up:  Follow up in 3 months or sooner

## 2022-07-16 ENCOUNTER — Ambulatory Visit (HOSPITAL_COMMUNITY): Payer: Medicaid Other | Admitting: Clinical

## 2022-07-23 NOTE — Telephone Encounter (Signed)
Please schedule appointment.  Thanks

## 2022-07-27 DIAGNOSIS — Z419 Encounter for procedure for purposes other than remedying health state, unspecified: Secondary | ICD-10-CM | POA: Diagnosis not present

## 2022-07-31 ENCOUNTER — Ambulatory Visit (HOSPITAL_COMMUNITY)
Admission: RE | Admit: 2022-07-31 | Discharge: 2022-07-31 | Disposition: A | Discharge status: Home / Self Care | Payer: Medicaid Other | Source: Ambulatory Visit | Attending: Family Medicine | Admitting: Family Medicine

## 2022-07-31 ENCOUNTER — Encounter (HOSPITAL_COMMUNITY): Payer: Self-pay

## 2022-07-31 ENCOUNTER — Other Ambulatory Visit: Payer: Self-pay | Admitting: Nurse Practitioner

## 2022-07-31 VITALS — BP 120/70 | HR 74 | Wt 100.0 lb

## 2022-07-31 DIAGNOSIS — I428 Other cardiomyopathies: Secondary | ICD-10-CM | POA: Diagnosis not present

## 2022-07-31 DIAGNOSIS — I351 Nonrheumatic aortic (valve) insufficiency: Secondary | ICD-10-CM

## 2022-07-31 DIAGNOSIS — Z79899 Other long term (current) drug therapy: Secondary | ICD-10-CM | POA: Insufficient documentation

## 2022-07-31 DIAGNOSIS — I3139 Other pericardial effusion (noninflammatory): Secondary | ICD-10-CM | POA: Insufficient documentation

## 2022-07-31 DIAGNOSIS — I5041 Acute combined systolic (congestive) and diastolic (congestive) heart failure: Secondary | ICD-10-CM

## 2022-07-31 DIAGNOSIS — F1011 Alcohol abuse, in remission: Secondary | ICD-10-CM

## 2022-07-31 DIAGNOSIS — E46 Unspecified protein-calorie malnutrition: Secondary | ICD-10-CM | POA: Insufficient documentation

## 2022-07-31 DIAGNOSIS — Z7984 Long term (current) use of oral hypoglycemic drugs: Secondary | ICD-10-CM | POA: Diagnosis not present

## 2022-07-31 DIAGNOSIS — I5022 Chronic systolic (congestive) heart failure: Secondary | ICD-10-CM | POA: Diagnosis not present

## 2022-07-31 DIAGNOSIS — Z681 Body mass index (BMI) 19 or less, adult: Secondary | ICD-10-CM | POA: Insufficient documentation

## 2022-07-31 DIAGNOSIS — F419 Anxiety disorder, unspecified: Secondary | ICD-10-CM | POA: Insufficient documentation

## 2022-07-31 MED ORDER — CARVEDILOL 6.25 MG PO TABS: 6.2500 mg | ORAL_TABLET | Freq: Two times a day (BID) | ORAL | 3 refills | Status: DC

## 2022-07-31 NOTE — Patient Instructions (Addendum)
Thank you for coming in today  [place] were done today, if any labs are abnormal the clinic will call you No news is good news  INCREASE Coreg to 6.25 mg 1 tablet twice daily   Your physician recommends that you schedule a follow-up appointment in:  Keep follow up with Dr. Aundra Dubin    Do the following things EVERYDAY: Weigh yourself in the morning before breakfast. Write it down and keep it in a log. Take your medicines as prescribed Eat low salt foods--Limit salt (sodium) to 2000 mg per day.  Stay as active as you can everyday Limit all fluids for the day to less than 2 liters At the [facility], you and your health needs are our priority. As part of our continuing mission to provide you with exceptional heart care, we have created designated Provider Care Teams. These Care Teams include your primary Cardiologist (physician) and Advanced Practice Providers (APPs- Physician Assistants and Nurse Practitioners) who all work together to provide you with the care you need, when you need it.   You may see any of the following providers on your designated Care Team at your next follow up: Dr Glori Bickers Dr Loralie Champagne Dr. Roxana Hires, NP Lyda Jester, [place] [facility] [place], [place] Forestine Na, NP Audry Riles, PharmD   Please be sure to bring in all your medications bottles to every appointment.   If you have any questions or concerns before your next appointment please send [place] a message through [place] or call our office at [phone].    TO LEAVE A MESSAGE FOR THE NURSE SELECT OPTION 2, PLEASE LEAVE A MESSAGE INCLUDING: YOUR NAME DATE OF BIRTH CALL BACK NUMBER REASON FOR CALL**this is important as we prioritize the call backs  YOU WILL RECEIVE A CALL BACK THE SAME DAY AS LONG AS YOU CALL BEFORE 4:00 PM

## 2022-07-31 NOTE — Progress Notes (Signed)
ADVANCED HF CLINIC NOTE  Primary Care: Perrin Maltese, NP HF Cardiologist: Dr. Aundra Dubin  HPI: Jamie Shields is a 42 y.o.Mar[place] Kitchen female with PMH of substance abuse, chronic bronchitis, hypovolemic shock, protein calorie malnutrition, seizures, bipolar disorder, and substance abuse and new diagnosis of systolic heart fialure.    She was admitted 10/23 for hypovolemic shock requiring IVF, pressors, midodrine and stress dose corticosteroids.    Admitted 11/23 with acute combined systolic and diastolic heart failure. Echo showed EF 30-35%, mild RV dysfunction, small-moderate pericardial effusion, probably moderate AI, and dilated IVC. Echo 1 month prior showed EF 70-75%, normal RV and possible severe AI. Diuresed with IV lasix, underwent L/RHC showing mildly elevated filling pressures, preserved CO and no significant CAD. Felt thiamine deficiency/Beriberi syndrome or a form of stress/Takotsubo CM possible. GDMT titrated. Underwent TEE to better assess AI, which showed mild to moderate AI. Cardiac MRI showed LVEF 38%, RVEF 41%, no LGE, ECV 38%. Discharged home, weight 100 lbs at discharge (down 15 lbs total during hospitalization).  Today she returns for HF follow up. Overall feeling fine. 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 She does heavy lifting and has no shortness of breath at work. Denies palpitations, abnormal bleeding, CP, dizziness, edema, or PND/Orthopnea. Appetite ok. No fever or chills. Weight at home 100 pounds. Taking all medications. Took lasix last week for leg swelling after being on her feet for awhile at work. No longer smokes cigarettes, but vapes. No longer drinks ETOH (previously drank heavily), no drugs.   Strong cardiac family history. Dad has had MI, mom with MI and HTN, maternal grandfather had an aneurysm and multiple MIs, and paternal grandfather had CABGx3 and multiple MIs. No history in siblings or children.  ECG (personally reviewed): none ordered  today.  Labs (12/23): K 3.8, creatinine 0.69  Cardiac Studies - TEE (11/23) : EF 30%, mild RV dysfunction, trileaflet aortic valve with mild-moderate central AI and no AS.   - R/LHC (11/23): no significant CAD, RA mean 13, PA 42/23 mean 33, PCWP mean 19, CO/CI (Fick) 4.04/2.6, PVR 3.4 WU  - Echo (11/23): EF 30-35%, LV with global hypokinesis, RV mildly enlarged, small to mod pericardial effusion, mild MR, mild to mod TR, Mod AV regurgitation. Pulmonic valve regurgitation Mod.   - Echo (10/23): EF 70-75%, no MR, mild TR, suspicious for severe aortic insufficiency. Severe aortic regurgitation   Past Medical History:  Diagnosis Date   Anxiety    Bipolar 1 disorder (HCC)    Bursitis of hip    Chronic bronchitis    Hip joint pain    Seizures (HCC)    Shock (HCC)    hypovolemic   Current Outpatient Medications  Medication Sig Dispense Refill   ascorbic acid (VITAMIN C) 250 MG tablet Take 1 tablet (250 mg total) by mouth 2 (two) times daily. 180 tablet 0   carvedilol (COREG) 3.125 MG tablet Take 1 tablet (3.125 mg total) by mouth 2 (two) times daily with a meal. 60 tablet 6   cholecalciferol (CHOLECALCIFEROL) 25 MCG tablet Take 1 tablet (1,000 Units total) by mouth daily. 30 tablet 2   dapagliflozin propanediol (FARXIGA) 10 MG TABS tablet Take 1 tablet (10 mg total) by mouth daily. 30 tablet 6   digoxin (LANOXIN) 0.125 MG tablet Take 1 tablet (0.125 mg total) by mouth daily. 60 tablet 6   folic acid (FOLVITE) 1 MG tablet Take 1 tablet (1 mg total) by mouth daily. 90 tablet 0  furosemide (LASIX) 20 MG tablet Take 1 tablet (20 mg total) by mouth as needed. 30 tablet 6   levETIRAcetam (KEPPRA) 500 MG tablet Take 1 tablet (500 mg total) by mouth 2 (two) times daily. 60 tablet 0   Multiple Vitamins-Minerals (MULTIVITAMIN WITH MINERALS) tablet Take 1 tablet by mouth daily. 90 tablet 0   pantoprazole (PROTONIX) 40 MG tablet Take 1 tablet (40 mg total) by mouth daily. 30 tablet 2    sacubitril-valsartan (ENTRESTO) 24-26 MG Take 1 tablet by mouth 2 (two) times daily. 60 tablet 6   spironolactone (ALDACTONE) 25 MG tablet Take 1 tablet (25 mg total) by mouth daily. 30 tablet 6   thiamine (VITAMIN B1) 100 MG tablet Take 1 tablet (100 mg total) by mouth daily. 30 tablet 1   vitamin A 3 MG (10000 UNITS) capsule Take 1 capsule (10,000 Units total) by mouth daily. 90 capsule 0   mirtazapine (REMERON) 15 MG tablet Take 1 tablet (15 mg total) by mouth at bedtime. 30 tablet 0   No current facility-administered medications for this encounter.   Allergies  Allergen Reactions   Cucumber Extract Anaphylaxis   Keflex [Cephalexin] Nausea And Vomiting   Macrobid [Nitrofurantoin Monohydrate Macrocrystals] Hives and Swelling   Naproxen Nausea And Vomiting   Penicillins Nausea And Vomiting    Heavy vomitting Did PCN reaction causing immediate rash, facial/tongue/throat swelling, SOB or lightheadedness with hypotension: No swelling, but im not sure if i got a rash as i was red all over already Did PCN reaction causing severe rash involving mucus membranes or skin necrosis: No Did a PCN reaction that required hospitalization Yes went to the ED Has patient had a PCN reaction occurring within the last 10 years: No-childhood allergy If all of the above answers are "NO", then may proceed with   Tramadol Nausea And Vomiting   Watermelon Concentrate Diarrhea   Darvocet [Propoxyphene N-Acetaminophen] Nausea And Vomiting and Other (See Comments)    migraines   Flagyl [Metronidazole Hcl] Hives, Swelling and Rash   Ketorolac Nausea And Vomiting and Other (See Comments)    migraines   Ultram [Tramadol Hcl] Nausea And Vomiting and Other (See Comments)    migraines   Vicodin [Hydrocodone-Acetaminophen] Nausea And Vomiting and Other (See Comments)    migraines   Social History   Socioeconomic History   Marital status: Legally Separated    Spouse name: Not on file   Number of children: Not on  file   Years of education: Not on file   Highest education level: Not on file  Occupational History   Not on file  Tobacco Use   Smoking status: Former    Packs/day: 0.50    Types: Cigarettes   Smokeless tobacco: Never   Tobacco comments:    Former smoker 07/03/22  Vaping Use   Vaping Use: Some days  Substance and Sexual Activity   Alcohol use: Yes    Comment: rarely   Drug use: No   Sexual activity: Never    Birth control/protection: None  Other Topics Concern   Not on file  Social History Narrative   Not on file   Social Determinants of Health   Financial Resource Strain: Low Risk  (06/03/2022)   Overall Financial Resource Strain (CARDIA)    Difficulty of Paying Living Expenses: Not very hard  Food Insecurity: No Food Insecurity (06/03/2022)   Hunger Vital Sign    Worried About Running Out of Food in the Last Year: Never true  Ran Out of Food in the Last Year: Never true  Transportation Needs: No Transportation Needs (06/03/2022)   PRAPARE - Administrator, Civil Service (Medical): No    Lack of Transportation (Non-Medical): No  Physical Activity: Not on file  Stress: Not on file  Social Connections: Not on file  Intimate Partner Violence: Not At Risk (06/03/2022)   Humiliation, Afraid, Rape, and Kick questionnaire    Fear of Current or Ex-Partner: No    Emotionally Abused: No    Physically Abused: No    Sexually Abused: No   Family History  Problem Relation Age of Onset   Heart attack Mother    Hypertension Mother    Heart attack Father    Heart attack Maternal Grandfather    Heart attack Paternal Grandfather    Diabetes Other    Cancer Other    BP 120/70   Pulse 74   Wt 45.4 kg (100 lb)   SpO2 100%   BMI 16.14 kg/m   Wt Readings from Last 3 Encounters:  07/31/22 45.4 kg (100 lb)  07/03/22 44.1 kg (97 lb 3.2 oz)  06/26/22 41.7 kg (92 lb)   PHYSICAL EXAM: General:  NAD. No resp difficulty, cachectic. HEENT: Normal Neck: Supple. No  JVD. Carotids 2+ bilat; no bruits. No lymphadenopathy or thryomegaly appreciated. Cor: PMI nondisplaced. Regular rate & rhythm. No rubs, gallops or murmurs. Clear S2 Lungs: Clear Abdomen: Soft, nontender, nondistended. No hepatosplenomegaly. No bruits or masses. Good bowel sounds. Extremities: No cyanosis, clubbing, rash, edema Neuro: Alert & oriented x 3, cranial nerves grossly intact. Moves all 4 extremities w/o difficulty. Affect pleasant.  ASSESSMENT & PLAN: 1. Chronic systolic CHF: Nonischemic cardiomyopathy.  Echo this admission with EF 30-35%, mild RV dysfunction, small-moderate pericardial effusion, probably moderate AI, and dilated IVC. This is surprising given prior echo in 10/23 showed EF 70-75%, normal RV, and possible severe AI (not well-visualized).  BNP was markedly elevated. LHC/RHC showed mildly elevated filling pressures, preserved cardiac output, and no significant coronary disease.  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  Some form of stress/Takotsubo-type cardiomyopathy is also possible. cMRI 11/23 showed LVEF 38%, RVEF 41%, no LGE, elevated ECV.  NYHA I, volume status stable.  - Increase Coreg to 6.26 mg bid. - Continue Lasix 20 mg PRN.  - Continue digoxin 0.125 mg daily. Dig level 0.6 07/03/22 - Continue spironolactone 25 mg daily. BMET today. - Continue Farxiga 10 mg daily. No GU symptoms. - Continue Entresto 24/26 mg bid. - Continue thiamine supplementation.  - She declines cardiac rehab. - Repeat echo next visit. 2. Aortic insufficiency: ?Severe on 10/23 echo.  TEE  showed mild-moderate AI with trileaflet valve.  - Moderate AI visually by cardiac MRI, but mild by regurgitant fraction.  3. Psych: Anxiety, ?PTSD, ?bipolar disorder.  Suspect this plays a role in her malnutrition.  - PCP has referred her to psychiatry. 4. Protein Malnutrition: Body mass index is 16.14 kg/m. - She says she has a good appetite. She is on mirtazapine. 5.  ?Adrenal insufficiency: Did not have full workup of this last admission but not on steroids and probably not adrenally insufficient.  6. Prior heavy ETOH: She has quit > 6 months. 7. Prior IVDU: Listed in chart but she denies.    Follow up in 2 months with Dr. Shirlee Latch + echo.  Jamie Rome, FNP-BC 07/31/22

## 2022-08-11 ENCOUNTER — Telehealth: Payer: Medicaid Other | Admitting: Physician Assistant

## 2022-08-11 DIAGNOSIS — R3989 Other symptoms and signs involving the genitourinary system: Secondary | ICD-10-CM | POA: Diagnosis not present

## 2022-08-11 MED ORDER — SULFAMETHOXAZOLE-TRIMETHOPRIM 800-160 MG PO TABS: 1.0000 | ORAL_TABLET | Freq: Two times a day (BID) | ORAL | 0 refills | Status: DC

## 2022-08-11 NOTE — Progress Notes (Signed)
I have spent 5 minutes in review of e-visit questionnaire, review and updating patient chart, medical decision making and response to patient.   Pepe [place] Janita Camberos, PA-C    

## 2022-08-11 NOTE — Progress Notes (Signed)
E-Visit for Urinary Problems  We are sorry that you are not feeling well.  Here is how we plan to help!  Based on what you shared with me it looks like you most likely have a simple urinary tract infection.  A UTI (Urinary Tract Infection) is a bacterial infection of the bladder.  Most cases of urinary tract infections are simple to treat but a key part of your care is to encourage you to drink plenty of fluids and watch your symptoms carefully.  I have prescribed Bactrim DS One tablet twice a day for 5 days.  Your symptoms should gradually improve. Call us if the burning in your urine worsens, you develop worsening fever, back pain or pelvic pain or if your symptoms do not resolve after completing the antibiotic.  Urinary tract infections can be prevented by drinking plenty of water to keep your body hydrated.  Also be sure when you wipe, wipe from front to back and don't hold it in!  If possible, empty your bladder every 4 hours.  HOME CARE Drink plenty of fluids Compete the full course of the antibiotics even if the symptoms resolve Remember, when you need to go.go. Holding in your urine can increase the likelihood of getting a UTI! GET HELP RIGHT AWAY IF: You cannot urinate You get a high fever Worsening back pain occurs You see blood in your urine You feel sick to your stomach or throw up You feel like you are going to pass out  MAKE SURE YOU  Understand these instructions. Will watch your condition. Will get help right away if you are not doing well or get worse.   Thank you for choosing an e-visit.  Your e-visit answers were reviewed by a board certified advanced clinical practitioner to complete your personal care plan. Depending upon the condition, your plan could have included both over the counter or prescription medications.  Please review your pharmacy choice. Make sure the pharmacy is open so you can pick up prescription now. If there is a problem, you may contact  your provider through [employer] and have the prescription routed to another pharmacy.  Your safety is important to [place]. If you have drug allergies check your prescription carefully.   For the next 24 hours you can use MyChart to ask questions about today's visit, request a non-urgent call back, or ask for a work or school excuse. You will get an email in the next two days asking about your experience. I hope that your e-visit has been valuable and will speed your recovery.

## 2022-08-27 DIAGNOSIS — Z419 Encounter for procedure for purposes other than remedying health state, unspecified: Secondary | ICD-10-CM | POA: Diagnosis not present

## 2022-08-31 ENCOUNTER — Telehealth (HOSPITAL_COMMUNITY): Payer: Self-pay | Admitting: *Deleted

## 2022-08-31 NOTE — Telephone Encounter (Signed)
Echo auth request faxed to [facility]   

## 2022-09-04 ENCOUNTER — Ambulatory Visit (INDEPENDENT_AMBULATORY_CARE_PROVIDER_SITE_OTHER): Payer: Medicaid Other | Admitting: Clinical

## 2022-09-04 ENCOUNTER — Encounter (HOSPITAL_COMMUNITY): Payer: Self-pay

## 2022-09-04 DIAGNOSIS — F331 Major depressive disorder, recurrent, moderate: Secondary | ICD-10-CM | POA: Diagnosis not present
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
## 2022-09-25 ENCOUNTER — Encounter (HOSPITAL_COMMUNITY): Payer: Self-pay | Admitting: Cardiology

## 2022-09-25 ENCOUNTER — Ambulatory Visit (HOSPITAL_COMMUNITY)
Admission: RE | Admit: 2022-09-25 | Discharge: 2022-09-25 | Disposition: A | Discharge status: Home / Self Care | Payer: Medicaid Other | Source: Ambulatory Visit | Attending: Family Medicine | Admitting: Family Medicine

## 2022-09-25 ENCOUNTER — Encounter: Payer: Self-pay | Admitting: Nurse Practitioner

## 2022-09-25 ENCOUNTER — Ambulatory Visit (HOSPITAL_BASED_OUTPATIENT_CLINIC_OR_DEPARTMENT_OTHER)
Admission: RE | Admit: 2022-09-25 | Discharge: 2022-09-25 | Disposition: A | Discharge status: Home / Self Care | Payer: Medicaid Other | Source: Ambulatory Visit | Attending: Cardiology | Admitting: Cardiology

## 2022-09-25 ENCOUNTER — Ambulatory Visit (INDEPENDENT_AMBULATORY_CARE_PROVIDER_SITE_OTHER): Payer: Medicaid Other | Admitting: Nurse Practitioner

## 2022-09-25 ENCOUNTER — Other Ambulatory Visit: Payer: Self-pay | Admitting: Nurse Practitioner

## 2022-09-25 VITALS — BP 108/60 | HR 50 | Wt 97.4 lb

## 2022-09-25 VITALS — BP 85/49 | HR 63 | Temp 97.2°F | Ht 66.0 in | Wt 97.6 lb

## 2022-09-25 DIAGNOSIS — I351 Nonrheumatic aortic (valve) insufficiency: Secondary | ICD-10-CM

## 2022-09-25 DIAGNOSIS — Z79899 Other long term (current) drug therapy: Secondary | ICD-10-CM | POA: Insufficient documentation

## 2022-09-25 DIAGNOSIS — I5022 Chronic systolic (congestive) heart failure: Secondary | ICD-10-CM | POA: Insufficient documentation

## 2022-09-25 DIAGNOSIS — Z8249 Family history of ischemic heart disease and other diseases of the circulatory system: Secondary | ICD-10-CM | POA: Diagnosis not present

## 2022-09-25 DIAGNOSIS — F319 Bipolar disorder, unspecified: Secondary | ICD-10-CM | POA: Diagnosis not present

## 2022-09-25 DIAGNOSIS — H9313 Tinnitus, bilateral: Secondary | ICD-10-CM

## 2022-09-25 DIAGNOSIS — F1011 Alcohol abuse, in remission: Secondary | ICD-10-CM | POA: Insufficient documentation

## 2022-09-25 DIAGNOSIS — R413 Other amnesia: Secondary | ICD-10-CM | POA: Diagnosis not present

## 2022-09-25 DIAGNOSIS — Z681 Body mass index (BMI) 19 or less, adult: Secondary | ICD-10-CM | POA: Insufficient documentation

## 2022-09-25 DIAGNOSIS — G40909 Epilepsy, unspecified, not intractable, without status epilepticus: Secondary | ICD-10-CM

## 2022-09-25 DIAGNOSIS — E46 Unspecified protein-calorie malnutrition: Secondary | ICD-10-CM | POA: Insufficient documentation

## 2022-09-25 DIAGNOSIS — R001 Bradycardia, unspecified: Secondary | ICD-10-CM | POA: Insufficient documentation

## 2022-09-25 DIAGNOSIS — Z419 Encounter for procedure for purposes other than remedying health state, unspecified: Secondary | ICD-10-CM | POA: Diagnosis not present

## 2022-09-25 DIAGNOSIS — I428 Other cardiomyopathies: Secondary | ICD-10-CM | POA: Diagnosis not present

## 2022-09-25 LAB — BASIC METABOLIC PANEL
Anion gap: 7 (ref 5–15)
BUN: 5 mg/dL — ABNORMAL LOW (ref 6–20)
CO2: 22 mmol/L (ref 22–32)
Calcium: 8.9 mg/dL (ref 8.9–10.3)
Chloride: 109 mmol/L (ref 98–111)
Creatinine, Ser: 0.8 mg/dL (ref 0.44–1.00)
GFR, Estimated: >60 mL/min (ref >60)
Glucose, Bld: 84 mg/dL (ref 70–99)
Potassium: 4.1 mmol/L (ref 3.5–5.1)
Sodium: 138 mmol/L (ref 135–145)

## 2022-09-25 LAB — ECHOCARDIOGRAM COMPLETE
Area-P 1/2: 3.2 cm2
Calc EF: 59.9 %
P 1/2 time: 545 msec
S' Lateral: 2.7 cm
Single Plane A2C EF: 61.7 %
Single Plane A4C EF: 55.1 %

## 2022-09-25 MED ORDER — FLUTICASONE PROPIONATE 50 MCG/ACT NA SUSP: 2.0000 | Freq: Every day | NASAL | 6 refills | Status: DC

## 2022-09-25 NOTE — Progress Notes (Signed)
  Echocardiogram 2D Echocardiogram has been performed.  Jamie Shields 09/25/2022, 10:46 AM

## 2022-09-25 NOTE — Patient Instructions (Signed)
STOP Digoxin  Labs done today, your results will be available in MyChart, we will contact you for abnormal readings.  Repeat blood work in 3 months.  Your physician recommends that you schedule a follow-up appointment in: 6 months (September) ** please call the office in July to arrange your follow up appointment. **  If you have any questions or concerns before your next appointment please send [place] a message through [place] or call our office at [phone].    TO LEAVE A MESSAGE FOR THE NURSE SELECT OPTION 2, PLEASE LEAVE A MESSAGE INCLUDING: YOUR NAME DATE OF BIRTH CALL BACK NUMBER REASON FOR CALL**this is important as we prioritize the call backs  YOU WILL RECEIVE A CALL BACK THE SAME DAY AS LONG AS YOU CALL BEFORE 4:00 PM  At the [facility], you and your health needs are our priority. As part of our continuing mission to provide you with exceptional heart care, we have created designated Provider Care Teams. These Care Teams include your primary Cardiologist (physician) and Advanced Practice Providers (APPs- Physician Assistants and Nurse Practitioners) who all work together to provide you with the care you need, when you need it.   You may see any of the following providers on your designated Care Team at your next follow up: Dr Glori Bickers Dr Loralie Champagne Dr. Roxana Hires, NP Lyda Jester, [place] [facility] [place], [place] Forestine Na, NP Audry Riles, PharmD   Please be sure to bring in all your medications bottles to every appointment.    Thank you for choosing [facility]

## 2022-09-25 NOTE — Assessment & Plan Note (Signed)
-   Ambulatory referral to Neurology   2. Memory loss  Referral to neurology  3. Tinnitus of both ears  - fluticasone (FLONASE) 50 MCG/ACT nasal spray; Place 2 sprays into both nostrils daily.  Dispense: 16 g; Refill: 6   Follow up:  Follow up in 6 months

## 2022-09-25 NOTE — Patient Instructions (Addendum)
1. Seizure disorder [facility])  - Ambulatory referral to Neurology   2. Memory loss  Referral to neurology  3. Tinnitus of both ears  - fluticasone (FLONASE) 50 MCG/ACT nasal spray; Place 2 sprays into both nostrils daily.  Dispense: 16 g; Refill: 6   Follow up:  Follow up in 6 months

## 2022-09-25 NOTE — Progress Notes (Signed)
$'@Patient'k$  ID: Jamie Shields, female    DOB: 04/23/1981, 42 y.o.   MRN: [id number]  Chief Complaint  Patient presents with   Follow-up    Referring provider: No ref. provider found   HPI  Jamie Shields is a 42 y.o. female with PMH of bipolar disorder, substance abuse, chronic bronchitis, hypovolemic shock, protein calorie malnutrition, seizures, bipolar disorder, and substance abuse.     Patient presents today for follow-up visit.  Overall she has been doing well since her last visit here.  She did have a follow-up with cardiology today including echo.  Cardiology told her that her echo had improved since her last check when she was hospitalized in November.  Patient is requesting a referral back to neurology.  She is established with [facility] neurology and would like to go back to see them.  We discussed that she can probably just call them to make her appointment.  Patient does have memory issues and does have a history of seizures. Denies f/c/s, n/v/d, hemoptysis, PND, leg swelling Denies chest pain or edema    Allergies  Allergen Reactions   Cucumber Extract Anaphylaxis   Keflex [Cephalexin] Nausea And Vomiting   Macrobid [Nitrofurantoin Monohydrate Macrocrystals] Hives and Swelling   Naproxen Nausea And Vomiting   Penicillins Nausea And Vomiting    Heavy vomitting Did PCN reaction causing immediate rash, facial/tongue/throat swelling, SOB or lightheadedness with hypotension: No swelling, but im not sure if i got a rash as i was red all over already Did PCN reaction causing severe rash involving mucus membranes or skin necrosis: No Did a PCN reaction that required hospitalization Yes went to the ED Has patient had a PCN reaction occurring within the last 10 years: No-childhood allergy If all of the above answers are "NO", then may proceed with   Tramadol Nausea And Vomiting   Watermelon Concentrate Diarrhea   Darvocet [Propoxyphene N-Acetaminophen] Nausea And Vomiting and Other (See  Comments)    migraines   Flagyl [Metronidazole Hcl] Hives, Swelling and Rash   Ketorolac Nausea And Vomiting and Other (See Comments)    migraines   Ultram [Tramadol Hcl] Nausea And Vomiting and Other (See Comments)    migraines   Vicodin [Hydrocodone-Acetaminophen] Nausea And Vomiting and Other (See Comments)    migraines     There is no immunization history on file for this patient.  Past Medical History:  Diagnosis Date   Anxiety    Bipolar 1 disorder ([place])    Bursitis of hip    Chronic bronchitis    Hip joint pain    Seizures ([place])    Shock ([place])    hypovolemic    Tobacco History: Social History   Tobacco Use  Smoking Status Former   Packs/day: 0.50   Types: Cigarettes  Smokeless Tobacco Never  Tobacco Comments   Former smoker 07/03/22   Counseling given: Not Answered Tobacco comments: Former smoker 07/03/22   Outpatient Encounter Medications as of 09/25/2022  Medication Sig   carvedilol (COREG) 6.25 MG tablet Take 1 tablet (6.25 mg total) by mouth 2 (two) times daily.   dapagliflozin propanediol (FARXIGA) 10 MG TABS tablet Take 1 tablet (10 mg total) by mouth daily.   fluticasone (FLONASE) 50 MCG/ACT nasal spray Place 2 sprays into both nostrils daily.   furosemide (LASIX) 20 MG tablet Take 1 tablet (20 mg total) by mouth as needed.   levETIRAcetam (KEPPRA) 500 MG tablet Take 1 tablet (500 mg total) by mouth 2 (two) times daily.  mirtazapine (REMERON) 15 MG tablet Take 1 tablet (15 mg total) by mouth at bedtime.   pantoprazole (PROTONIX) 40 MG tablet Take 1 tablet (40 mg total) by mouth daily.   sacubitril-valsartan (ENTRESTO) 24-26 MG Take 1 tablet by mouth 2 (two) times daily.   spironolactone (ALDACTONE) 25 MG tablet Take 1 tablet (25 mg total) by mouth daily.   thiamine (VITAMIN B1) 100 MG tablet Take 1 tablet (100 mg total) by mouth daily.   No facility-administered encounter medications on file as of 09/25/2022.     Review of Systems  Review of  Systems  Constitutional: Negative.   HENT: Negative.    Cardiovascular: Negative.   Gastrointestinal: Negative.   Allergic/Immunologic: Negative.   Neurological: Negative.   Psychiatric/Behavioral: Negative.         Physical Exam  BP (!) 85/49   Pulse 63   Temp (!) 97.2 F (36.2 C)   Ht '5\' 6"'$  (1.676 m)   Wt 97 lb 9.6 oz (44.3 kg)   SpO2 100%   BMI 15.75 kg/m   Wt Readings from Last 5 Encounters:  09/25/22 97 lb 9.6 oz (44.3 kg)  09/25/22 97 lb 6.4 oz (44.2 kg)  07/31/22 100 lb (45.4 kg)  07/03/22 97 lb 3.2 oz (44.1 kg)  06/26/22 92 lb (41.7 kg)     Physical Exam Vitals and nursing note reviewed.  Constitutional:      General: She is not in acute distress.    Appearance: She is well-developed.  Cardiovascular:     Rate and Rhythm: Normal rate and regular rhythm.  Pulmonary:     Effort: Pulmonary effort is normal.     Breath sounds: Normal breath sounds.  Neurological:     Mental Status: She is alert and oriented to person, place, and time.      Lab Results:  CBC    Component Value Date/Time   WBC 7.3 06/26/2022 1438   WBC 8.3 05/31/2022 0603   RBC 3.94 06/26/2022 1438   RBC 3.19 (L) 05/31/2022 0603   HGB 12.3 06/26/2022 1438   HCT 37.1 06/26/2022 1438   PLT 306 06/26/2022 1438   MCV 94 06/26/2022 1438   MCH 31.2 06/26/2022 1438   MCH 31.0 05/31/2022 0603   MCHC 33.2 06/26/2022 1438   MCHC 30.5 05/31/2022 0603   RDW 14.3 06/26/2022 1438   LYMPHSABS 2.0 05/14/2022 0420   MONOABS 0.4 05/14/2022 0420   EOSABS 0.1 05/14/2022 0420   BASOSABS 0.0 05/14/2022 0420    BMET    Component Value Date/Time   NA 138 09/25/2022 1122   NA 139 06/26/2022 1438   K 4.1 09/25/2022 1122   CL 109 09/25/2022 1122   CO2 22 09/25/2022 1122   GLUCOSE 84 09/25/2022 1122   BUN 5 (L) 09/25/2022 1122   BUN 6 06/26/2022 1438   CREATININE 0.80 09/25/2022 1122   CALCIUM 8.9 09/25/2022 1122   GFRNONAA >60 09/25/2022 1122   GFRAA >60 04/28/2015 1240    BNP     Component Value Date/Time   BNP 87.5 07/03/2022 1529    ProBNP No results found for: "PROBNP"  Imaging: ECHOCARDIOGRAM COMPLETE  Result Date: 09/25/2022    ECHOCARDIOGRAM REPORT   Patient Name:   Jamie Shields Date of Exam: 09/25/2022 Medical Rec #:  [id number]      Height:       66.0 in Accession #:    [id number]     Weight:       100.0 lb  Date of Birth:  1981-03-08       BSA:          1.490 m Patient Age:    42 years       BP:           93/63 mmHg Patient Gender: F              HR:           49 bpm. Exam Location:  Outpatient Procedure: 2D Echo, Cardiac Doppler and Color Doppler Indications:    congestive heart failure  History:        Patient has prior history of Echocardiogram examinations, most                 recent 06/03/2022. Risk Factors:history of substance abuse.  Sonographer:    Johny Chess RDCS Referring Phys: [phone] JESSICA M MILF[place]  Sonographer Comments: Image acquisition challenging due to respiratory motion. IMPRESSIONS  1. Left ventricular ejection fraction, by estimation, is 60 to 65%. Left ventricular ejection fraction by 2D MOD biplane is 59.9 %. The left ventricle has normal function. The left ventricle has no regional wall motion abnormalities. Left ventricular diastolic parameters were normal.  2. Right ventricular systolic function is normal. The right ventricular size is normal. Tricuspid regurgitation signal is inadequate for assessing PA pressure.  3. The mitral valve is grossly normal. Trivial mitral valve regurgitation.  4. The aortic valve is tricuspid. Aortic valve regurgitation is mild.  5. The inferior vena cava is normal in size with greater than 50% respiratory variability, suggesting right atrial pressure of 3 mmHg. Comparison(s): Changes from prior study are noted. 06/03/2022: LVEF 30%, global HK, mild to moderate AI. FINDINGS  Left Ventricle: Left ventricular ejection fraction, by estimation, is 60 to 65%. Left ventricular ejection fraction by 2D MOD biplane is 59.9  %. The left ventricle has normal function. The left ventricle has no regional wall motion abnormalities. The left ventricular internal cavity size was normal in size. There is no left ventricular hypertrophy. Left ventricular diastolic parameters were normal. Right Ventricle: The right ventricular size is normal. No increase in right ventricular wall thickness. Right ventricular systolic function is normal. Tricuspid regurgitation signal is inadequate for assessing PA pressure. Left Atrium: Left atrial size was normal in size. Right Atrium: Right atrial size was normal in size. Pericardium: There is no evidence of pericardial effusion. Mitral Valve: The mitral valve is grossly normal. Trivial mitral valve regurgitation. Tricuspid Valve: The tricuspid valve is grossly normal. Tricuspid valve regurgitation is trivial. Aortic Valve: The aortic valve is tricuspid. Aortic valve regurgitation is mild. Aortic regurgitation PHT measures 545 msec. Pulmonic Valve: The pulmonic valve was normal in structure. Pulmonic valve regurgitation is not visualized. Aorta: The aortic root and ascending aorta are structurally normal, with no evidence of dilitation. Venous: The inferior vena cava is normal in size with greater than 50% respiratory variability, suggesting right atrial pressure of 3 mmHg. IAS/Shunts: No atrial level shunt detected by color flow Doppler.  LEFT VENTRICLE PLAX 2D                        Biplane EF (MOD) LVIDd:         4.20 cm         LV Biplane EF:   Left LVIDs:         2.70 cm  ventricular LV PW:         0.70 cm                          ejection LV IVS:        0.60 cm                          fraction by LVOT diam:     1.90 cm                          2D MOD LV SV:         67                               biplane is LV SV Index:   45                               59.9 %. LVOT Area:     2.84 cm                                Diastology                                LV e' medial:    9.46  cm/s LV Volumes (MOD)               LV E/e' medial:  10.8 LV vol d, MOD    66.4 ml       LV e' lateral:   11.40 cm/s A2C:                           LV E/e' lateral: 8.9 LV vol d, MOD    48.3 ml A4C: LV vol s, MOD    25.4 ml A2C: LV vol s, MOD    21.7 ml A4C: LV SV MOD A2C:   41.0 ml LV SV MOD A4C:   48.3 ml LV SV MOD BP:    35.2 ml RIGHT VENTRICLE             IVC RV Basal diam:  2.20 cm     IVC diam: 1.70 cm RV S prime:     12.60 cm/s LEFT ATRIUM             Index        RIGHT ATRIUM          Index LA diam:        2.60 cm 1.75 cm/m   RA Area:     8.38 cm LA Vol (A2C):   22.2 ml 14.90 ml/m  RA Volume:   15.40 ml 10.34 ml/m LA Vol (A4C):   30.0 ml 20.14 ml/m LA Biplane Vol: 26.1 ml 17.52 ml/m  AORTIC VALVE LVOT Vmax:   109.00 cm/s LVOT Vmean:  67.700 cm/s LVOT VTI:    0.238 m AI PHT:      545 msec  AORTA Ao Root diam: 3.00 cm Ao Asc diam:  2.60 cm MITRAL VALVE MV Area (PHT): 3.20 cm     SHUNTS MV Decel Time: 237 msec     Systemic VTI:  0.24  m MV E velocity: 102.00 cm/s  Systemic Diam: 1.90 cm MV A velocity: 35.30 cm/s MV E/A ratio:  2.89 Lyman Bishop MD Electronically signed by Lyman Bishop MD Signature Date/Time: 09/25/2022/11:04:28 AM    Final      Assessment & Plan:   Seizure disorder [facility]) - Ambulatory referral to Neurology   2. Memory loss  Referral to neurology  3. Tinnitus of both ears  - fluticasone (FLONASE) 50 MCG/ACT nasal spray; Place 2 sprays into both nostrils daily.  Dispense: 16 g; Refill: 6   Follow up:  Follow up in 6 months     Fenton Foy, NP 09/25/2022

## 2022-09-27 NOTE — Progress Notes (Signed)
ADVANCED HF CLINIC NOTE  Primary Care: Jamie Foy, NP HF Cardiologist: Dr. Aundra Shields  Jamie Shields is a 42 y.o.[place] female with PMH of substance abuse, chronic bronchitis, hypovolemic shock, protein calorie malnutrition, seizures, bipolar disorder, and diagnosis of systolic heart failure in 10/23.    She was admitted 10/23 for hypovolemic shock requiring IVF, pressors, midodrine and stress dose corticosteroids.  Echo in 10/23 showed EF 70-75%, no MR, mild TR, suspicious for severe aortic insufficiency. ?Severe aortic regurgitation.    Admitted again 11/23 with acute combined systolic and diastolic heart failure. Echo showed EF 30-35%, mild RV dysfunction, small-moderate pericardial effusion, probably moderate AI, and dilated IVC. This was significantly different from the 10/23 study. Diuresed with IV lasix, underwent L/RHC showing mildly elevated filling pressures, preserved CO and no significant CAD. Felt thiamine deficiency/Beriberi syndrome or a form of stress/Takotsubo cardiomyopathy possible. GDMT titrated. Underwent TEE to better assess AI, which showed mild to moderate AI. Cardiac MRI showed LVEF 38%, RVEF 41%, no LGE, ECV 38%. Discharged home, weight 100 lbs at discharge (down 15 lbs total during hospitalization).  Strong cardiac family history. Dad has had MI, mom with MI and HTN, maternal grandfather had an aneurysm and multiple MIs, and paternal grandfather had CABG x 3 and multiple MIs. No history in siblings or children.  Echo was done today and reviewed, EF up to 55-60% with normal RV and mild AI.   She returns for followup of CHF.  She is doing well.  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  No significant exertional dyspnea, fatigue, or chest pain.  No lightheadedness.  She occasionally uses Lasix when her ankles swell. Overall, feels good.  Not drinking ETOH or using drugs.   ECG (personally reviewed): NSR, right axis deviation, poor RWP  Labs (12/23): K 3.8, creatinine 0.69, digoxin  0.6, BNP 87.5  PMH: 1. Bipolar disorder 2. Malnutrition 3. H/o ETOH abuse: Has quit.  4. ?H/o drug abuse: Listed in chart but patient denies.  5. Chronic systolic CHF: Nonischemic cardiomyopathy.  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 - Echo (10/23): EF 70-75%, no MR, mild TR, suspicious for severe aortic insufficiency. ?Severe aortic regurgitation - TEE (11/23) : EF 30%, mild RV dysfunction, trileaflet aortic valve with mild-moderate central AI and no AS.  - R/LHC (11/23): no significant CAD, RA mean 13, PA 42/23 mean 33, PCWP mean 19, CO/CI (Fick) 4.04/2.6, PVR 3.4 WU - Echo (11/23): EF 30-35%, LV with global hypokinesis, RV mildly enlarged, small to mod pericardial effusion, mild MR, mild to mod TR, Mod AV regurgitation. Pulmonic valve regurgitation moderate.  - Cardiac MRI (11/23): Moderate pericardial effusion, EF 38%, RV EF 41%, moderate AI visually, no myocardial LGE, ECV 38%.  - Echo (2/24): EF 55-60%, RV normal, IVC normal, mild AI.  6. Aortic insufficiency: Mild on 2/24 echo, appeared worse on prior studies.    ROS: All systems reviewed and negative except as per HPI.     Current Outpatient Medications  Medication Sig Dispense Refill   carvedilol (COREG) 6.25 MG tablet Take 1 tablet (6.25 mg total) by mouth 2 (two) times daily. 180 tablet 3   dapagliflozin propanediol (FARXIGA) 10 MG TABS tablet Take 1 tablet (10 mg total) by mouth daily. 30 tablet 6   furosemide (LASIX) 20 MG tablet Take 1 tablet (20 mg total) by mouth as needed. 30 tablet 6   levETIRAcetam (KEPPRA) 500 MG tablet Take 1 tablet (500 mg total) by mouth 2 (two) times daily. 60 tablet 0  mirtazapine (REMERON) 15 MG tablet Take 1 tablet (15 mg total) by mouth at bedtime. 30 tablet 0   pantoprazole (PROTONIX) 40 MG tablet Take 1 tablet (40 mg total) by mouth daily. 30 tablet 2   sacubitril-valsartan (ENTRESTO) 24-26 MG Take 1 tablet by mouth 2 (two) times daily. 60 tablet 6   spironolactone (ALDACTONE) 25  MG tablet Take 1 tablet (25 mg total) by mouth daily. 30 tablet 6   thiamine (VITAMIN B1) 100 MG tablet Take 1 tablet (100 mg total) by mouth daily. 30 tablet 1   fluticasone (FLONASE) 50 MCG/ACT nasal spray Place 2 sprays into both nostrils daily. 16 g 6   No current facility-administered medications for this encounter.   Allergies  Allergen Reactions   Cucumber Extract Anaphylaxis   Keflex [Cephalexin] Nausea And Vomiting   Macrobid [Nitrofurantoin Monohydrate Macrocrystals] Hives and Swelling   Naproxen Nausea And Vomiting   Penicillins Nausea And Vomiting    Heavy vomitting Did PCN reaction causing immediate rash, facial/tongue/throat swelling, SOB or lightheadedness with hypotension: No swelling, but im not sure if i got a rash as i was red all over already Did PCN reaction causing severe rash involving mucus membranes or skin necrosis: No Did a PCN reaction that required hospitalization Yes went to the ED Has patient had a PCN reaction occurring within the last 10 years: No-childhood allergy If all of the above answers are "NO", then may proceed with   Tramadol Nausea And Vomiting   Watermelon Concentrate Diarrhea   Darvocet [Propoxyphene N-Acetaminophen] Nausea And Vomiting and Other (See Comments)    migraines   Flagyl [Metronidazole Hcl] Hives, Swelling and Rash   Ketorolac Nausea And Vomiting and Other (See Comments)    migraines   Ultram [Tramadol Hcl] Nausea And Vomiting and Other (See Comments)    migraines   Vicodin [Hydrocodone-Acetaminophen] Nausea And Vomiting and Other (See Comments)    migraines   Social History   Socioeconomic History   Marital status: Legally Separated    Spouse name: Not on file   Number of children: Not on file   Years of education: Not on file   Highest education level: Not on file  Occupational History   Not on file  Tobacco Use   Smoking status: Former    Packs/day: 0.50    Types: Cigarettes   Smokeless tobacco: Never   Tobacco  comments:    Former smoker 07/03/22  Vaping Use   Vaping Use: Some days  Substance and Sexual Activity   Alcohol use: Yes    Comment: rarely   Drug use: No   Sexual activity: Never    Birth control/protection: None  Other Topics Concern   Not on file  Social History Narrative   Not on file   Social Determinants of Health   Financial Resource Strain: Low Risk  (06/03/2022)   Overall Financial Resource Strain (CARDIA)    Difficulty of Paying Living Expenses: Not very hard  Food Insecurity: No Food Insecurity (06/03/2022)   Hunger Vital Sign    Worried About Running Out of Food in the Last Year: Never true    Ran Out of Food in the Last Year: Never true  Transportation Needs: No Transportation Needs (06/03/2022)   PRAPARE - Hydrologist (Medical): No    Lack of Transportation (Non-Medical): No  Physical Activity: Not on file  Stress: Not on file  Social Connections: Not on file  Intimate Partner Violence: Not At Risk (  06/03/2022)   Humiliation, Afraid, Rape, and Kick questionnaire    Fear of Current or Ex-Partner: No    Emotionally Abused: No    Physically Abused: No    Sexually Abused: No   Family History  Problem Relation Age of Onset   Heart attack Mother    Hypertension Mother    Heart attack Father    Heart attack Maternal Grandfather    Heart attack Paternal Grandfather    Diabetes Other    Cancer Other    BP 108/60   Pulse (!) 50   Wt 44.2 kg (97 lb 6.4 oz)   SpO2 100%   BMI 15.72 kg/m   Wt Readings from Last 3 Encounters:  09/25/22 44.2 kg (97 lb 6.4 oz)  09/25/22 44.3 kg (97 lb 9.6 oz)  07/31/22 45.4 kg (100 lb)   PHYSICAL EXAM: General: NAD, thin Neck: No JVD, no thyromegaly or thyroid nodule.  Lungs: Clear to auscultation bilaterally with normal respiratory effort. CV: Nondisplaced PMI.  Heart regular S1/S2, no S3/S4, no murmur.  No peripheral edema.  No carotid bruit.  Normal pedal pulses.  Abdomen: Soft, nontender, no  hepatosplenomegaly, no distention.  Skin: Intact without lesions or rashes.  Neurologic: Alert and oriented x 3.  Psych: Normal affect. Extremities: No clubbing or cyanosis.  HEENT: Normal.   ASSESSMENT & PLAN: 1. Chronic systolic CHF: Nonischemic cardiomyopathy.  Echo in 11/23 with EF 30-35%, mild RV dysfunction, small-moderate pericardial effusion, probably moderate AI, and dilated IVC. This is surprising given prior echo in 10/23 showed EF 70-75%, normal RV, and possible severe AI (not well-visualized).  BNP was markedly elevated. LHC/RHC showed mildly elevated filling pressures, preserved cardiac output, and no significant coronary disease.  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 cMRI 11/23 showed LVEF 38%, RVEF 41%, no LGE, elevated ECV.  Repeat echo was done today with EF up to 55-60% with mild AI.  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  NYHA class I symptoms, not volume overloaded on exam. Tolerating all meds without lightheadedness.  - Continue Coreg 6.26 mg bid. - She does not need Lasix.  - Stop digoxin.  - Continue spironolactone 25 mg daily. BMET today. - Continue Farxiga 10 mg daily. No GU symptoms. - Continue Entresto 24/26 mg bid. - Continue thiamine supplementation.  - Would repeat echo in 1 year.  If EF remains normal, think we can cut back on her other meds.  2. Aortic insufficiency: ?Severe on 10/23 echo.  TEE  showed mild-moderate AI with trileaflet valve. Echo today with only mild AI.  3. Psych: Anxiety, ?PTSD, ?bipolar disorder.  Suspect this plays a role in her malnutrition.  - Should have psychiatry followup.  4. Protein Malnutrition: Body mass index is 15.72 kg/m. - She says she has a good appetite. She is on mirtazapine. 5. ?Adrenal insufficiency: Did not have full workup of this last admission but not on steroids and probably not adrenally  insufficient.  6. Prior heavy ETOH: She has quit > 6 months. 7. Prior IVDU: Listed in chart but she denies.    BMET 3 months, followup 6 months APP.  Repeat echo at 1 year.    Jamie Shields 09/27/22

## 2022-10-01 ENCOUNTER — Ambulatory Visit (INDEPENDENT_AMBULATORY_CARE_PROVIDER_SITE_OTHER): Payer: Medicaid Other | Admitting: Clinical

## 2022-10-01 DIAGNOSIS — F331 Major depressive disorder, recurrent, moderate: Secondary | ICD-10-CM

## 2022-10-04 NOTE — Progress Notes (Signed)
   THERAPIST PROGRESS NOTE Virtual Visit via Video Note  I connected with [place] on 10/01/2022 at  4:00 PM EST by a video enabled telemedicine application and verified that I am speaking with the correct person using two identifiers.  Location: Patient: home Provider: office   I discussed the limitations of evaluation and management by telemedicine and the availability of in person appointments. The patient expressed understanding and agreed to proceed.   Follow Up Instructions: I discussed the assessment and treatment plan with the patient. The patient was provided an opportunity to ask questions and all were answered. The patient agreed with the plan and demonstrated an understanding of the instructions.   The patient was advised to call back or seek an in-person evaluation if the symptoms worsen or if the condition fails to improve as anticipated.    Session Time: 20 minutes  Participation Level: Active  Behavioral Response: CasualAlertEuthymic  Type of Therapy: Individual Therapy  Treatment Goals addressed: client will score less than a 10 on the PHQ9  ProgressTowards Goals: Progressing  Interventions: CBT and Supportive  Summary:  Marshayla L Antosh is a 42 y.o. female who presents for the scheduled appointment oriented times five, appropriately dressed, and friendly. Client denied hallucinations and delusions. Client reported on today she is doing pretty well. Client reported she recently had an appointment with her heart doctor. Client reported she was told her heart has returned to a healthy state and they are spacing out her check ups. Client reported they also reduced her medications. Client reported she has been having symptoms of anxiety but is unsure of what is causing it. Client reported nothing bad has triggered the feelings particularly. Client reported she is happy she saw her son recently. Client reported she has been upset about not having a good memory.  Client reported her long term memory does not work well and asked if seizures can affect memory.  Evidence of progress towards goal:  client reported 1 positive which is improvement with her health.   Suicidal/Homicidal: Nowithout intent/plan  Therapist Response:  Therapist began the appointment asking the client how she has been doing since she was last seen. Therapist used CBT to engage using active listening and positive emotional support.  Therapist used CBT to ask the client about her mood and contributing stressors. Therapist used CBT to engage and reinforce the client speak with her medical doctor about her memory. Therapist used CBT ask the client to identify her progress with frequency of use with coping skills with continued practice in her daily activity.    Therapist assigned the client homework to practice self care.   Plan: Return again in 3 weeks.  Diagnosis: mdd, recurrent episode, moderate with anxious distress  Collaboration of Care: Patient refused AEB none requested by the client.  Patient/Guardian was advised Release of Information must be obtained prior to any record release in order to collaborate their care with an outside provider. Patient/Guardian was advised if they have not already done so to contact the registration department to sign all necessary forms in order for [place] to release information regarding their care.   Consent: Patient/Guardian gives verbal consent for treatment and assignment of benefits for services provided during this visit. Patient/Guardian expressed understanding and agreed to proceed.   [place], LCSW 10/01/2022

## 2022-10-08 ENCOUNTER — Ambulatory Visit (INDEPENDENT_AMBULATORY_CARE_PROVIDER_SITE_OTHER): Payer: Medicaid Other | Admitting: Physician Assistant

## 2022-10-08 ENCOUNTER — Encounter (HOSPITAL_COMMUNITY): Payer: Self-pay | Admitting: Physician Assistant

## 2022-10-08 DIAGNOSIS — F331 Major depressive disorder, recurrent, moderate: Secondary | ICD-10-CM | POA: Insufficient documentation

## 2022-10-08 DIAGNOSIS — F411 Generalized anxiety disorder: Secondary | ICD-10-CM | POA: Diagnosis not present

## 2022-10-08 MED ORDER — MIRTAZAPINE 15 MG PO TABS: 15.0000 mg | ORAL_TABLET | Freq: Every day | ORAL | 1 refills | Status: DC

## 2022-10-08 MED ORDER — HYDROXYZINE HCL 10 MG PO TABS: 10.0000 mg | ORAL_TABLET | Freq: Three times a day (TID) | ORAL | 1 refills | Status: DC | PRN

## 2022-10-08 MED ORDER — BUSPIRONE HCL 15 MG PO TABS: 30.0000 mg | ORAL_TABLET | Freq: Two times a day (BID) | ORAL | 1 refills | Status: DC
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
## 2022-10-26 DIAGNOSIS — Z419 Encounter for procedure for purposes other than remedying health state, unspecified: Secondary | ICD-10-CM | POA: Diagnosis not present

## 2022-11-06 ENCOUNTER — Telehealth: Payer: Self-pay

## 2022-11-06 NOTE — Telephone Encounter (Signed)
..  Patient declines further follow up and engagement by the Managed Medicaid Team. Appropriate care team members and provider have been notified via electronic communication. The Managed Medicaid Team is available to follow up with the patient after provider conversation with the patient regarding recommendation for engagement and subsequent re-referral to the Managed Medicaid Team.     Bryanda Mikel Care Guide  Medicaid Managed  [place]  [phone]  

## 2022-11-19 ENCOUNTER — Telehealth (INDEPENDENT_AMBULATORY_CARE_PROVIDER_SITE_OTHER): Payer: Medicaid Other | Admitting: Physician Assistant

## 2022-11-19 ENCOUNTER — Encounter (HOSPITAL_COMMUNITY): Payer: Self-pay | Admitting: Physician Assistant

## 2022-11-19 DIAGNOSIS — F331 Major depressive disorder, recurrent, moderate: Secondary | ICD-10-CM | POA: Diagnosis not present

## 2022-11-19 DIAGNOSIS — F411 Generalized anxiety disorder: Secondary | ICD-10-CM

## 2022-11-19 MED ORDER — MIRTAZAPINE 15 MG PO TABS: 15.0000 mg | ORAL_TABLET | Freq: Every day | ORAL | 1 refills | Status: DC

## 2022-11-19 MED ORDER — BUSPIRONE HCL 10 MG PO TABS: 10.0000 mg | ORAL_TABLET | Freq: Two times a day (BID) | ORAL | 1 refills | Status: DC

## 2022-11-19 NOTE — Progress Notes (Signed)
BH MD/PA/NP OP Progress Note  Virtual Visit via Video Note  I connected with Jamie Shields on 11/21/22 at  2:30 PM EDT by a video enabled telemedicine application and verified that I am speaking with the correct person using two identifiers.  Location: Patient: Home Provider: Clinic   I discussed the limitations of evaluation and management by telemedicine and the availability of in person appointments. The patient expressed understanding and agreed to proceed.  Follow Up Instructions:   I discussed the assessment and treatment plan with the patient. The patient was provided an opportunity to ask questions and all were answered. The patient agreed with the plan and demonstrated an understanding of the instructions.   The patient was advised to call back or seek an in-person evaluation if the symptoms worsen or if the condition fails to improve as anticipated.  I provided 21 minutes of non-face-to-face time during this encounter.  Meta Hatchet, PA    11/19/2022 5:11 PM JERILYN GILLASPIE  MRN:  [id number]  Chief Complaint:  Chief Complaint  Patient presents with   Follow-up   HPI:   Jamie Shields is a 42 year old, Caucasian female with a past psychiatric history significant for major depressive disorder with anxious distress, and generalized anxiety disorder who presents to [facility] via virtual video visit for follow-up and medication management.  Patient is currently being managed on the following psychiatric medications:  Mirtazapine 15 mg at bedtime Hydroxyzine 10 mg 3 times daily as needed BuSpar 15 mg 2 times daily  Patient reports that her anxiety has gotten worse since her last encounter.  In addition to worsening anxiety, patient also endorses worsening of her anger.  Patient reports that she will get angry over the smallest of things.  Patient is not sure which medications are helping in which medications or not.  Per  patient's roommate, he states that when her BuSpar was increased, he noticed changes in her mood.  Patient rates her anxiety as 6 out of 10.  Patient endorses stressors related to her children.  Patient denies overt depressive symptoms but states that she has been tired.  Patient does not believe her hydroxyzine has been helpful.  A GAD-7 screen was performed with the patient scoring a 20.  Patient is alert and oriented x 4, calm, cooperative, and fully engaged in conversation during the encounter.  Patient endorses fatigue and an hunger and describes her mood as irritable.  Patient denies suicidal or homicidal ideations.  She further denies auditory or visual hallucinations and does not appear to be responding to internal/external stimuli.  Patient reports that she has been receiving roughly 5 hours of sleep per night; however, her patient states that she has been receiving roughly 8 hours of sleep per night.  Patient endorses good appetite and eats on average 2 meals per day along with a snack every 15 minutes.  Patient endorses alcohol consumption on occasion.  Patient denies tobacco use but does endorse engaging in vaping.  Patient denies illicit drug use.  Visit Diagnosis:    ICD-10-CM   1. Major depressive disorder, recurrent episode, moderate with anxious distress  F33.1     2. Generalized anxiety disorder  F41.1       Past Psychiatric History:  Patient reports that she has been diagnosed with the following psychiatric illnesses: bipolar disorder, panic attack, anxiety, and depression. Patient is currently diagnosed with major depressive disorder and generalized anxiety disorder   Patient denies a  past history of hospitalization due to mental health   Patient denies past history of suicide attempts  Patient denies a past history of homicide  Past Medical History:  Past Medical History:  Diagnosis Date   Anxiety    Bipolar 1 disorder    Bursitis of hip    Chronic bronchitis    Hip  joint pain    Seizures    Shock    hypovolemic    Past Surgical History:  Procedure Laterality Date   BIOPSY  05/07/2022   Procedure: BIOPSY;  Surgeon: Benancio Deeds, MD;  Location: [facility] ENDOSCOPY;  Service: Gastroenterology;;   Thressa Sheller STUDY  06/03/2022   Procedure: BUBBLE STUDY;  Surgeon: Laurey Morale, MD;  Location: [facility] ENDOSCOPY;  Service: Cardiovascular;;   CESAREAN SECTION     2002   ESOPHAGOGASTRODUODENOSCOPY (EGD) WITH PROPOFOL N/A 05/07/2022   Procedure: ESOPHAGOGASTRODUODENOSCOPY (EGD) WITH PROPOFOL;  Surgeon: Benancio Deeds, MD;  Location: [facility] ENDOSCOPY;  Service: Gastroenterology;  Laterality: N/A;   KNEE ARTHROSCOPY     MOUTH SURGERY  2010   RIGHT/LEFT HEART CATH AND CORONARY ANGIOGRAPHY N/A 06/02/2022   Procedure: RIGHT/LEFT HEART CATH AND CORONARY ANGIOGRAPHY;  Surgeon: Laurey Morale, MD;  Location: [facility] INVASIVE CV LAB;  Service: Cardiovascular;  Laterality: N/A;   TEE WITHOUT CARDIOVERSION N/A 06/03/2022   Procedure: TRANSESOPHAGEAL ECHOCARDIOGRAM (TEE);  Surgeon: Laurey Morale, MD;  Location: [facility] ENDOSCOPY;  Service: Cardiovascular;  Laterality: N/A;    Family Psychiatric History:  Father - depression Mother - depression Grandmother (paternal) - depression Brother - panic attacks  Family History:  Family History  Problem Relation Age of Onset   Heart attack Mother    Hypertension Mother    Heart attack Father    Heart attack Maternal Grandfather    Heart attack Paternal Grandfather    Diabetes Other    Cancer Other     Social History:  Social History   Socioeconomic History   Marital status: Legally Separated    Spouse name: Not on file   Number of children: Not on file   Years of education: Not on file   Highest education level: Not on file  Occupational History   Not on file  Tobacco Use   Smoking status: Former    Packs/day: .5    Types: Cigarettes   Smokeless tobacco: Never   Tobacco comments:    Former smoker 07/03/22   Vaping Use   Vaping Use: Some days  Substance and Sexual Activity   Alcohol use: Yes    Comment: rarely   Drug use: No   Sexual activity: Never    Birth control/protection: None  Other Topics Concern   Not on file  Social History Narrative   Not on file   Social Determinants of Health   Financial Resource Strain: Low Risk  (06/03/2022)   Overall Financial Resource Strain (CARDIA)    Difficulty of Paying Living Expenses: Not very hard  Food Insecurity: No Food Insecurity (06/03/2022)   Hunger Vital Sign    Worried About Running Out of Food in the Last Year: Never true    Ran Out of Food in the Last Year: Never true  Transportation Needs: No Transportation Needs (06/03/2022)   PRAPARE - Administrator, Civil Service (Medical): No    Lack of Transportation (Non-Medical): No  Physical Activity: Not on file  Stress: Not on file  Social Connections: Not on file    Allergies:  Allergies  Allergen Reactions  Cucumber Extract Anaphylaxis   Keflex [Cephalexin] Nausea And Vomiting   Macrobid [Nitrofurantoin Monohydrate Macrocrystals] Hives and Swelling   Naproxen Nausea And Vomiting   Penicillins Nausea And Vomiting    Heavy vomitting Did PCN reaction causing immediate rash, facial/tongue/throat swelling, SOB or lightheadedness with hypotension: No swelling, but im not sure if i got a rash as i was red all over already Did PCN reaction causing severe rash involving mucus membranes or skin necrosis: No Did a PCN reaction that required hospitalization Yes went to the ED Has patient had a PCN reaction occurring within the last 10 years: No-childhood allergy If all of the above answers are "NO", then may proceed with   Tramadol Nausea And Vomiting   Watermelon Concentrate Diarrhea   Darvocet [Propoxyphene N-Acetaminophen] Nausea And Vomiting and Other (See Comments)    migraines   Flagyl [Metronidazole Hcl] Hives, Swelling and Rash   Ketorolac Nausea And Vomiting and  Other (See Comments)    migraines   Ultram [Tramadol Hcl] Nausea And Vomiting and Other (See Comments)    migraines   Vicodin [Hydrocodone-Acetaminophen] Nausea And Vomiting and Other (See Comments)    migraines    Metabolic Disorder Labs: No results found for: "HGBA1C", "MPG" No results found for: "PROLACTIN" No results found for: "CHOL", "TRIG", "HDL", "CHOLHDL", "VLDL", "LDLCALC" Lab Results  Component Value Date   TSH 1.258 05/01/2022    Therapeutic Level Labs: No results found for: "LITHIUM" No results found for: "VALPROATE" No results found for: "CBMZ"  Current Medications: Current Outpatient Medications  Medication Sig Dispense Refill   busPIRone (BUSPAR) 15 MG tablet Take 2 tablets (30 mg total) by mouth 2 (two) times daily. 60 tablet 1   carvedilol (COREG) 6.25 MG tablet Take 1 tablet (6.25 mg total) by mouth 2 (two) times daily. 180 tablet 3   dapagliflozin propanediol (FARXIGA) 10 MG TABS tablet Take 1 tablet (10 mg total) by mouth daily. 30 tablet 6   fluticasone (FLONASE) 50 MCG/ACT nasal spray Place 2 sprays into both nostrils daily. 16 g 6   furosemide (LASIX) 20 MG tablet TAKE 1 TABLET (20 MG) BY MOUTH DAILY 30 tablet 1   hydrOXYzine (ATARAX) 10 MG tablet Take 1 tablet (10 mg total) by mouth 3 (three) times daily as needed. 75 tablet 1   levETIRAcetam (KEPPRA) 500 MG tablet Take 1 tablet (500 mg total) by mouth 2 (two) times daily. 60 tablet 0   mirtazapine (REMERON) 15 MG tablet Take 1 tablet (15 mg total) by mouth at bedtime. 30 tablet 1   pantoprazole (PROTONIX) 40 MG tablet Take 1 tablet (40 mg total) by mouth daily. 30 tablet 2   sacubitril-valsartan (ENTRESTO) 24-26 MG Take 1 tablet by mouth 2 (two) times daily. 60 tablet 6   spironolactone (ALDACTONE) 25 MG tablet Take 1 tablet (25 mg total) by mouth daily. 30 tablet 6   thiamine (VITAMIN B1) 100 MG tablet Take 1 tablet (100 mg total) by mouth daily. 30 tablet 1   No current facility-administered  medications for this visit.     Musculoskeletal: Strength & Muscle Tone: within normal limits Gait & Station: normal Patient leans: N/A  Psychiatric Specialty Exam: Review of Systems  Psychiatric/Behavioral:  Positive for agitation and sleep disturbance. Negative for decreased concentration, dysphoric mood, hallucinations, self-injury and suicidal ideas. The patient is nervous/anxious. The patient is not hyperactive.     There were no vitals taken for this visit.There is no height or weight on file to calculate BMI.  General Appearance: Casual  Eye Contact:  Good  Speech:  Clear and Coherent and Normal Rate  Volume:  Normal  Mood:  Anxious and Irritable  Affect:  Congruent  Thought Process:  Coherent and Descriptions of Associations: Intact  Orientation:  Full (Time, Place, and Person)  Thought Content: WDL   Suicidal Thoughts:  No  Homicidal Thoughts:  No  Memory:  Immediate;   Good Recent;   Good Remote;   Good  Judgement:  Good  Insight:  Good  Psychomotor Activity:  Normal  Concentration:  Concentration: Good and Attention Span: Good  Recall:  Good  Fund of Knowledge: Good  Language: Good  Akathisia:  No  Handed:  Ambidextrous  AIMS (if indicated): not done  Assets:  Communication Skills Desire for Improvement Housing Physical Health Social Support Transportation Vocational/Educational  ADL's:  Intact  Cognition: WNL  Sleep:  Fair   Screenings: GAD-7    Flowsheet Row Video Visit from 11/19/2022 in [facility] Office Visit from 10/08/2022 in [facility] Counselor from 09/04/2022 in [facility]  Total GAD-7 Score 20 18 20       PHQ2-9    Flowsheet Row Video Visit from 11/19/2022 in [facility] Office Visit from 10/08/2022 in [facility] Office Visit from 09/25/2022 in [facility] Counselor from 09/04/2022  in [facility]  PHQ-2 Total Score 0 3 1 2   PHQ-9 Total Score -- 15 -- 14      Flowsheet Row Video Visit from 11/19/2022 in [facility] Office Visit from 10/08/2022 in [facility] Counselor from 09/04/2022 in [facility]  C-SSRS RISK CATEGORY No Risk No Risk No Risk        Assessment and Plan:   Jamie Shields is a 42 year old, Caucasian female with a past psychiatric history significant for major depressive disorder with anxious distress, and generalized anxiety disorder who presents to [facility] via virtual video visit for follow-up and medication management.  Patient reports that her anxiety and anger have worsened since the last encounter.  Patient states that she is easily triggered by the smallest of things.  Per patient's roommate, patient mood changed significantly whenever her BuSpar was stopped.  Patient continues to endorse anxiety but denies any overt depressive symptoms.  Patient reports that she has been on the variety of medications and is unsure of what is been helpful.  She reports that she has been on Klonopin in the past and states that it made her angry.  She reports that she experienced hallucinations when taking sertraline.  Patient has also taking gabapentin but is unsure how the medication effected her.  Patient reports that the only medications that seem effective in managing her symptoms have been Valium and Xanax.  Provider informed patient that both medications would not be prescribed to her due to being controlled substances.  Patient vocalized understanding.   Provider recommended decreasing patient's buspirone dosage back to 10 mg 2 times daily for the management of her anxiety.  Provider recommended patient do Genesight Testing determine what medications are compatible with her based off her genetics.  Patient was  agreeable to suggestion.  Provider to contact patient on when she can have the test performed on her.  In the meantime, patient to continue taking medications as prescribed.  Patient's medications to be e-prescribed  to pharmacy of choice.  Collaboration of Care: Collaboration of Care: Medication Management AEB provider managing patient's psychiatric medications, Psychiatrist AEB patient being seen by mental health provider at this facility, and Referral or follow-up with counselor/therapist AEB patient being seen by a licensed clinical social worker at this facility  Patient/Guardian was advised Release of Information must be obtained prior to any record release in order to collaborate their care with an outside provider. Patient/Guardian was advised if they have not already done so to contact the registration department to sign all necessary forms in order for [place] to release information regarding their care.   Consent: Patient/Guardian gives verbal consent for treatment and assignment of benefits for services provided during this visit. Patient/Guardian expressed understanding and agreed to proceed.   1. Major depressive disorder, recurrent episode, moderate with anxious distress  - mirtazapine (REMERON) 15 MG tablet; Take 1 tablet (15 mg total) by mouth at bedtime.  Dispense: 30 tablet; Refill: 1  2. Generalized anxiety disorder  - busPIRone (BUSPAR) 10 MG tablet; Take 1 tablet (10 mg total) by mouth 2 (two) times daily.  Dispense: 60 tablet; Refill: 1  Patient to follow-up in 6 weeks Provider spent a total of 21 minutes with the patient/reviewing patient's chart  Meta Hatchet, PA 11/19/2022, 5:11 PM

## 2022-11-25 DIAGNOSIS — Z419 Encounter for procedure for purposes other than remedying health state, unspecified: Secondary | ICD-10-CM | POA: Diagnosis not present

## 2022-11-26 ENCOUNTER — Other Ambulatory Visit (HOSPITAL_COMMUNITY): Payer: Self-pay | Admitting: Physician Assistant

## 2022-11-26 DIAGNOSIS — F411 Generalized anxiety disorder: Secondary | ICD-10-CM

## 2022-12-14 ENCOUNTER — Other Ambulatory Visit (HOSPITAL_COMMUNITY): Payer: Self-pay | Admitting: Physician Assistant

## 2022-12-14 DIAGNOSIS — F411 Generalized anxiety disorder: Secondary | ICD-10-CM

## 2022-12-25 ENCOUNTER — Ambulatory Visit (HOSPITAL_COMMUNITY)
Admission: RE | Admit: 2022-12-25 | Discharge: 2022-12-25 | Disposition: A | Discharge status: Home / Self Care | Payer: Medicaid Other | Source: Ambulatory Visit | Attending: Internal Medicine | Admitting: Internal Medicine

## 2022-12-25 ENCOUNTER — Other Ambulatory Visit (HOSPITAL_COMMUNITY): Payer: Medicaid Other

## 2022-12-25 DIAGNOSIS — I5022 Chronic systolic (congestive) heart failure: Secondary | ICD-10-CM | POA: Insufficient documentation

## 2022-12-25 LAB — BASIC METABOLIC PANEL
Anion gap: 8 (ref 5–15)
BUN: 6 mg/dL (ref 6–20)
CO2: 22 mmol/L (ref 22–32)
Calcium: 9.1 mg/dL (ref 8.9–10.3)
Chloride: 104 mmol/L (ref 98–111)
Creatinine, Ser: 0.95 mg/dL (ref 0.44–1.00)
GFR, Estimated: >60 mL/min (ref >60)
Glucose, Bld: 89 mg/dL (ref 70–99)
Potassium: 3.6 mmol/L (ref 3.5–5.1)
Sodium: 134 mmol/L — ABNORMAL LOW (ref 135–145)

## 2022-12-26 DIAGNOSIS — Z419 Encounter for procedure for purposes other than remedying health state, unspecified: Secondary | ICD-10-CM | POA: Diagnosis not present

## 2022-12-31 ENCOUNTER — Encounter (HOSPITAL_COMMUNITY): Payer: Self-pay | Admitting: Physician Assistant

## 2022-12-31 ENCOUNTER — Telehealth (INDEPENDENT_AMBULATORY_CARE_PROVIDER_SITE_OTHER): Payer: Medicaid Other | Admitting: Physician Assistant

## 2022-12-31 DIAGNOSIS — F411 Generalized anxiety disorder: Secondary | ICD-10-CM | POA: Diagnosis not present

## 2022-12-31 DIAGNOSIS — F331 Major depressive disorder, recurrent, moderate: Secondary | ICD-10-CM

## 2022-12-31 MED ORDER — MIRTAZAPINE 30 MG PO TABS
30.0000 mg | ORAL_TABLET | Freq: Every day | ORAL | 1 refills | Status: DC
Start: 2022-12-31 — End: 2023-02-12

## 2022-12-31 MED ORDER — BUSPIRONE HCL 10 MG PO TABS
20.0000 mg | ORAL_TABLET | Freq: Two times a day (BID) | ORAL | 1 refills | Status: DC
Start: 2022-12-31 — End: 2023-02-12

## 2022-12-31 MED ORDER — HYDROXYZINE HCL 10 MG PO TABS
10.0000 mg | ORAL_TABLET | Freq: Three times a day (TID) | ORAL | 1 refills | Status: DC | PRN
Start: 2022-12-31 — End: 2023-02-12

## 2022-12-31 NOTE — Progress Notes (Signed)
BH MD/PA/NP OP Progress Note  Virtual Visit via Video Note  I connected with Jamie Shields on 12/31/22 at  1:30 PM EDT by a video enabled telemedicine application and verified that I am speaking with the correct person using two identifiers.  Location: Patient: Home Provider: Clinic   I discussed the limitations of evaluation and management by telemedicine and the availability of in person appointments. The patient expressed understanding and agreed to proceed.  Follow Up Instructions:   I discussed the assessment and treatment plan with the patient. The patient was provided an opportunity to ask questions and all were answered. The patient agreed with the plan and demonstrated an understanding of the instructions.   The patient was advised to call back or seek an in-person evaluation if the symptoms worsen or if the condition fails to improve as anticipated.  I provided 15 minutes of non-face-to-face time during this encounter.  Meta Hatchet, PA    12/31/2022 2:54 PM Jamie Shields  MRN:  [id number]  Chief Complaint:  Chief Complaint  Patient presents with   Follow-up   Medication Management   HPI:   Jamie Shields is a 42 year old, Caucasian female with a past psychiatric history significant for major depressive disorder with anxious distress, and generalized anxiety disorder who presents to [facility] via virtual video visit for follow-up and medication management.  Patient is currently being managed on the following psychiatric medications:  Mirtazapine 15 mg at bedtime Hydroxyzine 10 mg 3 times daily as needed BuSpar 10 mg 2 times daily  Patient reports that she feels that her Remeron has not been effective stating that she has been experiencing sleeplessness right over the past month.  Patient reports that she has been more irritable due to having less sleep.  Patient denies elevated depression but states that she has been  experiencing increased appetite.  Patient endorses elevated anxiety and rates her anxiety as 7 out of 10 most days.  Patient's main stressor involves her lack of sleep. A GAD-7 screen was performed with the patient scoring a 19.  Patient is alert and oriented x 4, calm, cooperative, and fully engaged in conversation during the encounter. Patient describes her mood as "pissy" due to not sleeping last night. Patient denies suicidal or homicidal ideations. She further denies auditory or visual hallucinations and does not appear to be responding to internal/external stimuli. Patient endorses poor sleep and receives on average 4 - 5 hours of sleep per night. Patient endorses good appetite and eats on average 5 meals per day including snacks.  Patient denies alcohol consumption and illicit drug use.  Patient denies tobacco use but does engage in vaping.  Visit Diagnosis:    ICD-10-CM   1. Generalized anxiety disorder  F41.1 hydrOXYzine (ATARAX) 10 MG tablet    busPIRone (BUSPAR) 10 MG tablet    2. Major depressive disorder, recurrent episode, moderate with anxious distress (HCC)  F33.1 mirtazapine (REMERON) 30 MG tablet      Past Psychiatric History:  Patient reports that she has been diagnosed with the following psychiatric illnesses: bipolar disorder, panic attack, anxiety, and depression. Patient is currently diagnosed with major depressive disorder and generalized anxiety disorder   Patient denies a past history of hospitalization due to mental health   Patient denies past history of suicide attempts  Patient denies a past history of homicide  Past Medical History:  Past Medical History:  Diagnosis Date   Anxiety    Bipolar  1 disorder (HCC)    Bursitis of hip    Chronic bronchitis    Hip joint pain    Seizures (HCC)    Shock (HCC)    hypovolemic    Past Surgical History:  Procedure Laterality Date   BIOPSY  05/07/2022   Procedure: BIOPSY;  Surgeon: Benancio Deeds, MD;   Location: [facility] ENDOSCOPY;  Service: Gastroenterology;;   BUBBLE STUDY  06/03/2022   Procedure: BUBBLE STUDY;  Surgeon: Laurey Morale, MD;  Location: [facility] ENDOSCOPY;  Service: Cardiovascular;;   CESAREAN SECTION     2002   ESOPHAGOGASTRODUODENOSCOPY (EGD) WITH PROPOFOL N/A 05/07/2022   Procedure: ESOPHAGOGASTRODUODENOSCOPY (EGD) WITH PROPOFOL;  Surgeon: Benancio Deeds, MD;  Location: [facility] ENDOSCOPY;  Service: Gastroenterology;  Laterality: N/A;   KNEE ARTHROSCOPY     MOUTH SURGERY  2010   RIGHT/LEFT HEART CATH AND CORONARY ANGIOGRAPHY N/A 06/02/2022   Procedure: RIGHT/LEFT HEART CATH AND CORONARY ANGIOGRAPHY;  Surgeon: Laurey Morale, MD;  Location[facility] INVASIVE CV LAB;  Service: Cardiovascular;  Laterality: N/A;   TEE WITHOUT CARDIOVERSION N/A 06/03/2022   Procedure: TRANSESOPHAGEAL ECHOCARDIOGRAM (TEE);  Surgeon: Laurey Morale, MD;  Location: [facility] ENDOSCOPY;  Service: Cardiovascular;  Laterality: N/A;    Family Psychiatric History:  Father - depression Mother - depression Grandmother (paternal) - depression Brother - panic attacks  Family History:  Family History  Problem Relation Age of Onset   Heart attack Mother    Hypertension Mother    Heart attack Father    Heart attack Maternal Grandfather    Heart attack Paternal Grandfather    Diabetes Other    Cancer Other     Social History:  Social History   Socioeconomic History   Marital status: Legally Separated    Spouse name: Not on file   Number of children: Not on file   Years of education: Not on file   Highest education level: Not on file  Occupational History   Not on file  Tobacco Use   Smoking status: Former    Packs/day: .5    Types: Cigarettes   Smokeless tobacco: Never   Tobacco comments:    Former smoker 07/03/22  Vaping Use   Vaping Use: Some days  Substance and Sexual Activity   Alcohol use: Yes    Comment: rarely   Drug use: No   Sexual activity: Never    Birth control/protection: None   Other Topics Concern   Not on file  Social History Narrative   Not on file   Social Determinants of Health   Financial Resource Strain: Low Risk  (06/03/2022)   Overall Financial Resource Strain (CARDIA)    Difficulty of Paying Living Expenses: Not very hard  Food Insecurity: No Food Insecurity (06/03/2022)   Hunger Vital Sign    Worried About Running Out of Food in the Last Year: Never true    Ran Out of Food in the Last Year: Never true  Transportation Needs: No Transportation Needs (06/03/2022)   PRAPARE - Administrator, Civil Service (Medical): No    Lack of Transportation (Non-Medical): No  Physical Activity: Not on file  Stress: Not on file  Social Connections: Not on file    Allergies:  Allergies  Allergen Reactions   Cucumber Extract Anaphylaxis   Keflex [Cephalexin] Nausea And Vomiting   Macrobid [Nitrofurantoin Monohydrate Macrocrystals] Hives and Swelling   Naproxen Nausea And Vomiting   Penicillins Nausea And Vomiting    Heavy vomitting Did PCN  reaction causing immediate rash, facial/tongue/throat swelling, SOB or lightheadedness with hypotension: No swelling, but im not sure if i got a rash as i was red all over already Did PCN reaction causing severe rash involving mucus membranes or skin necrosis: No Did a PCN reaction that required hospitalization Yes went to the ED Has patient had a PCN reaction occurring within the last 10 years: No-childhood allergy If all of the above answers are "NO", then may proceed with   Tramadol Nausea And Vomiting   Watermelon Concentrate Diarrhea   Darvocet [Propoxyphene N-Acetaminophen] Nausea And Vomiting and Other (See Comments)    migraines   Flagyl [Metronidazole Hcl] Hives, Swelling and Rash   Ketorolac Nausea And Vomiting and Other (See Comments)    migraines   Ultram [Tramadol Hcl] Nausea And Vomiting and Other (See Comments)    migraines   Vicodin [Hydrocodone-Acetaminophen] Nausea And Vomiting and Other  (See Comments)    migraines    Metabolic Disorder Labs: No results found for: "HGBA1C", "MPG" No results found for: "PROLACTIN" No results found for: "CHOL", "TRIG", "HDL", "CHOLHDL", "VLDL", "LDLCALC" Lab Results  Component Value Date   TSH 1.258 05/01/2022    Therapeutic Level Labs: No results found for: "LITHIUM" No results found for: "VALPROATE" No results found for: "CBMZ"  Current Medications: Current Outpatient Medications  Medication Sig Dispense Refill   busPIRone (BUSPAR) 10 MG tablet Take 2 tablets (20 mg total) by mouth 2 (two) times daily. 120 tablet 1   carvedilol (COREG) 6.25 MG tablet Take 1 tablet (6.25 mg total) by mouth 2 (two) times daily. 180 tablet 3   dapagliflozin propanediol (FARXIGA) 10 MG TABS tablet Take 1 tablet (10 mg total) by mouth daily. 30 tablet 6   fluticasone (FLONASE) 50 MCG/ACT nasal spray Place 2 sprays into both nostrils daily. 16 g 6   furosemide (LASIX) 20 MG tablet TAKE 1 TABLET (20 MG) BY MOUTH DAILY 30 tablet 1   hydrOXYzine (ATARAX) 10 MG tablet Take 1 tablet (10 mg total) by mouth 3 (three) times daily as needed. 75 tablet 1   levETIRAcetam (KEPPRA) 500 MG tablet Take 1 tablet (500 mg total) by mouth 2 (two) times daily. 60 tablet 0   mirtazapine (REMERON) 30 MG tablet Take 1 tablet (30 mg total) by mouth at bedtime. 30 tablet 1   pantoprazole (PROTONIX) 40 MG tablet Take 1 tablet (40 mg total) by mouth daily. 30 tablet 2   sacubitril-valsartan (ENTRESTO) 24-26 MG Take 1 tablet by mouth 2 (two) times daily. 60 tablet 6   spironolactone (ALDACTONE) 25 MG tablet Take 1 tablet (25 mg total) by mouth daily. 30 tablet 6   thiamine (VITAMIN B1) 100 MG tablet Take 1 tablet (100 mg total) by mouth daily. 30 tablet 1   No current facility-administered medications for this visit.     Musculoskeletal: Strength & Muscle Tone: within normal limits Gait & Station: normal Patient leans: N/A  Psychiatric Specialty Exam: Review of Systems   Psychiatric/Behavioral:  Positive for agitation and sleep disturbance. Negative for decreased concentration, dysphoric mood, hallucinations, self-injury and suicidal ideas. The patient is nervous/anxious. The patient is not hyperactive.     There were no vitals taken for this visit.There is no height or weight on file to calculate BMI.  General Appearance: Casual  Eye Contact:  Good  Speech:  Clear and Coherent and Normal Rate  Volume:  Normal  Mood:  Anxious and Irritable  Affect:  Congruent  Thought Process:  Coherent and  Descriptions of Associations: Intact  Orientation:  Full (Time, Place, and Person)  Thought Content: WDL   Suicidal Thoughts:  No  Homicidal Thoughts:  No  Memory:  Immediate;   Good Recent;   Good Remote;   Good  Judgement:  Good  Insight:  Good  Psychomotor Activity:  Normal  Concentration:  Concentration: Good and Attention Span: Good  Recall:  Good  Fund of Knowledge: Good  Language: Good  Akathisia:  No  Handed:  Ambidextrous  AIMS (if indicated): not done  Assets:  Communication Skills Desire for Improvement Housing Physical Health Social Support Transportation Vocational/Educational  ADL's:  Intact  Cognition: WNL  Sleep:  Fair   Screenings: GAD-7    Flowsheet Row Video Visit from 12/31/2022 in [facility] Video Visit from 11/19/2022 in [facility] Office Visit from 10/08/2022 in [facility] Counselor from 09/04/2022 in [facility]  Total GAD-7 Score 19 20 18 20       PHQ2-9    Flowsheet Row Video Visit from 12/31/2022 in [facility] Video Visit from 11/19/2022 in [facility] Office Visit from 10/08/2022 in [facility] Office Visit from 09/25/2022 in [facility] Counselor from 09/04/2022 in [facility]   PHQ-2 Total Score 1 0 3 1 2   PHQ-9 Total Score -- -- 15 -- 14      Flowsheet Row Video Visit from 12/31/2022 in [facility] Video Visit from 11/19/2022 in [facility] Office Visit from 10/08/2022 in [facility]  C-SSRS RISK CATEGORY Low Risk No Risk No Risk        Assessment and Plan:   Jamie Shields is a 42 year old, Caucasian female with a past psychiatric history significant for major depressive disorder with anxious distress, and generalized anxiety disorder who presents to [facility] via virtual video visit for follow-up and medication management.  Patient reports that she has been experiencing sleepless nights stating that her Remeron not been effective in providing her with sleep.  Due to her lack of sleep, patient endorses being more irritable.  Although patient denies depressive symptoms she does continue to endorse anxiety related to lack of sleep.  Patient informed provider that she was taking her buspirone medication incorrectly and was taking 2 tablets twice a day, which she found effective in managing her anxiety.  Patient is interested in adjusting her buspirone dosage accordingly for the management of her anxiety.  Provider to adjust patient's buspirone from 10 mg to two 10 mg tablets twice a day for the management of her anxiety.  Provider also recommended increasing patient's mirtazapine from 15 mg to 30 mg at bedtime for the management of her depressive symptoms and for sleep.  Patient was agreeable to recommendations.  Patient's medications to be e-prescribed to pharmacy of choice.  Patient discussed with provider about reaching out to her previous psychiatric provider about her previous medication regimen.  She reports that she was attending a psychiatric facility in [facility] but does not remember the name of the location.  Patient states that when  she attended the previous facility, she was on medications that were helpful in managing her symptoms.  Provider to reach out to patient's previous psychiatric facility [facility] [possible location]).  Collaboration of Care: Collaboration of Care: Medication Management AEB  provider managing patient's psychiatric medications, Psychiatrist AEB patient being seen by mental health provider at this facility, and Referral or follow-up with counselor/therapist AEB patient being seen by a licensed clinical social worker at this facility  Patient/Guardian was advised Release of Information must be obtained prior to any record release in order to collaborate their care with an outside provider. Patient/Guardian was advised if they have not already done so to contact the registration department to sign all necessary forms in order for [place] to release information regarding their care.   Consent: Patient/Guardian gives verbal consent for treatment and assignment of benefits for services provided during this visit. Patient/Guardian expressed understanding and agreed to proceed.   1. Generalized anxiety disorder  - hydrOXYzine (ATARAX) 10 MG tablet; Take 1 tablet (10 mg total) by mouth 3 (three) times daily as needed.  Dispense: 75 tablet; Refill: 1 - busPIRone (BUSPAR) 10 MG tablet; Take 2 tablets (20 mg total) by mouth 2 (two) times daily.  Dispense: 120 tablet; Refill: 1  2. Major depressive disorder, recurrent episode, moderate with anxious distress (HCC)  - mirtazapine (REMERON) 30 MG tablet; Take 1 tablet (30 mg total) by mouth at bedtime.  Dispense: 30 tablet; Refill: 1  Patient to follow-up in 6 weeks Provider spent a total of 15 minutes with the patient/reviewing patient's chart  Meta Hatchet, PA 12/31/2022, 2:54 PM

## 2023-01-13 ENCOUNTER — Ambulatory Visit: Payer: Medicaid Other | Admitting: Nurse Practitioner

## 2023-01-18 ENCOUNTER — Ambulatory Visit: Payer: Medicaid Other | Admitting: Nurse Practitioner

## 2023-01-25 DIAGNOSIS — Z419 Encounter for procedure for purposes other than remedying health state, unspecified: Secondary | ICD-10-CM | POA: Diagnosis not present

## 2023-01-26 ENCOUNTER — Other Ambulatory Visit: Payer: Self-pay | Admitting: Nurse Practitioner

## 2023-01-26 ENCOUNTER — Other Ambulatory Visit (HOSPITAL_COMMUNITY)
Admission: RE | Admit: 2023-01-26 | Discharge: 2023-01-26 | Disposition: A | Discharge status: Home / Self Care | Payer: Medicaid Other | Source: Ambulatory Visit | Attending: Nurse Practitioner | Admitting: Nurse Practitioner

## 2023-01-26 ENCOUNTER — Encounter: Payer: Self-pay | Admitting: Nurse Practitioner

## 2023-01-26 ENCOUNTER — Ambulatory Visit (INDEPENDENT_AMBULATORY_CARE_PROVIDER_SITE_OTHER): Payer: Medicaid Other | Admitting: Nurse Practitioner

## 2023-01-26 VITALS — BP 101/66 | HR 79 | Temp 97.2°F | Ht 65.0 in | Wt 98.6 lb

## 2023-01-26 DIAGNOSIS — Z124 Encounter for screening for malignant neoplasm of cervix: Secondary | ICD-10-CM | POA: Diagnosis not present

## 2023-01-26 DIAGNOSIS — Z1231 Encounter for screening mammogram for malignant neoplasm of breast: Secondary | ICD-10-CM | POA: Diagnosis not present

## 2023-01-26 DIAGNOSIS — I5022 Chronic systolic (congestive) heart failure: Secondary | ICD-10-CM

## 2023-01-26 NOTE — Assessment & Plan Note (Signed)
Continue current medications Encouraged to have close follow-up with cardiology

## 2023-01-26 NOTE — Progress Notes (Signed)
Established Patient Office Visit  Subjective:  Patient ID: Jamie Shields, female    DOB: 09-30-1980  Age: 42 y.o. MRN: [id number]  CC:  Chief Complaint  Patient presents with   Gynecologic Exam    HPI Jamie Shields is a 42 y.o. female  has a past medical history of Anxiety, Bipolar 1 disorder (HCC), Bursitis of hip, Chronic bronchitis, Hip joint pain, Seizures (HCC), and Shock (HCC).  Patient presents for Pap smear.  Patient denies any adverse reactions to current medications.  Due for mammogram mammogram ordered, cervical Pap exam completed today  She denies fever, chills, chest pain, shortness of breath, abdominal pain, nausea, vomiting, headache, dizziness .    Past Medical History:  Diagnosis Date   Anxiety    Bipolar 1 disorder (HCC)    Bursitis of hip    Chronic bronchitis    Hip joint pain    Seizures (HCC)    Shock (HCC)    hypovolemic    Past Surgical History:  Procedure Laterality Date   BIOPSY  05/07/2022   Procedure: BIOPSY;  Surgeon: Benancio Deeds, MD;  Location: [facility] ENDOSCOPY;  Service: Gastroenterology;;   BUBBLE STUDY  06/03/2022   Procedure: BUBBLE STUDY;  Surgeon: Laurey Morale, MD;  Location: [facility] ENDOSCOPY;  Service: Cardiovascular;;   CESAREAN SECTION     2002   ESOPHAGOGASTRODUODENOSCOPY (EGD) WITH PROPOFOL N/A 05/07/2022   Procedure: ESOPHAGOGASTRODUODENOSCOPY (EGD) WITH PROPOFOL;  Surgeon: Benancio Deeds, MD;  Location: [facility] ENDOSCOPY;  Service: Gastroenterology;  Laterality: N/A;   KNEE ARTHROSCOPY     MOUTH SURGERY  2010   RIGHT/LEFT HEART CATH AND CORONARY ANGIOGRAPHY N/A 06/02/2022   Procedure: RIGHT/LEFT HEART CATH AND CORONARY ANGIOGRAPHY;  Surgeon: Laurey Morale, MD;  Location: [facility] INVASIVE CV LAB;  Service: Cardiovascular;  Laterality: N/A;   TEE WITHOUT CARDIOVERSION N/A 06/03/2022   Procedure: TRANSESOPHAGEAL ECHOCARDIOGRAM (TEE);  Surgeon: Laurey Morale, MD;  Location: [facility] ENDOSCOPY;  Service: Cardiovascular;   Laterality: N/A;    Family History  Problem Relation Age of Onset   Heart attack Mother    Hypertension Mother    Heart attack Father    Heart attack Maternal Grandfather    Heart attack Paternal Grandfather    Diabetes Other    Cancer Other     Social History   Socioeconomic History   Marital status: Legally Separated    Spouse name: Not on file   Number of children: Not on file   Years of education: Not on file   Highest education level: Associate degree: occupational, Scientist, product/process development, or vocational program  Occupational History   Not on file  Tobacco Use   Smoking status: Former    Packs/day: .5    Types: Cigarettes   Smokeless tobacco: Never   Tobacco comments:    Former smoker 07/03/22  Vaping Use   Vaping Use: Some days  Substance and Sexual Activity   Alcohol use: Yes    Comment: rarely   Drug use: No   Sexual activity: Never    Birth control/protection: None  Other Topics Concern   Not on file  Social History Narrative   Not on file   Social Determinants of Health   Financial Resource Strain: High Risk (01/12/2023)   Overall Financial Resource Strain (CARDIA)    Difficulty of Paying Living Expenses: Hard  Food Insecurity: Food Insecurity Present (01/12/2023)   Hunger Vital Sign    Worried About Running Out of Food in the Last Year:  Sometimes true    Ran Out of Food in the Last Year: Sometimes true  Transportation Needs: No Transportation Needs (01/12/2023)   PRAPARE - Administrator, Civil Service (Medical): No    Lack of Transportation (Non-Medical): No  Physical Activity: Unknown (01/12/2023)   Exercise Vital Sign    Days of Exercise per Week: 5 days    Minutes of Exercise per Session: Patient declined  Stress: Stress Concern Present (01/12/2023)   [employer] of Occupational Health - Occupational Stress Questionnaire    Feeling of Stress : Very much  Social Connections: Unknown (01/12/2023)   Social Connection and Isolation Panel  [NHANES]    Frequency of Communication with Friends and Family: Once a week    Frequency of Social Gatherings with Friends and Family: Patient declined    Attends Religious Services: 1 to 4 times per year    Active Member of [employer] or Organizations: No    Attends Engineer, structural: Not on file    Marital Status: Separated  Intimate Partner Violence: Not At Risk (06/03/2022)   Humiliation, Afraid, Rape, and Kick questionnaire    Fear of Current or Ex-Partner: No    Emotionally Abused: No    Physically Abused: No    Sexually Abused: No    Outpatient Medications Prior to Visit  Medication Sig Dispense Refill   busPIRone (BUSPAR) 10 MG tablet Take 2 tablets (20 mg total) by mouth 2 (two) times daily. 120 tablet 1   carvedilol (COREG) 6.25 MG tablet Take 1 tablet (6.25 mg total) by mouth 2 (two) times daily. 180 tablet 3   fluticasone (FLONASE) 50 MCG/ACT nasal spray Place 2 sprays into both nostrils daily. 16 g 6   furosemide (LASIX) 20 MG tablet TAKE 1 TABLET (20 MG) BY MOUTH DAILY 30 tablet 1   hydrOXYzine (ATARAX) 10 MG tablet Take 1 tablet (10 mg total) by mouth 3 (three) times daily as needed. 75 tablet 1   levETIRAcetam (KEPPRA) 500 MG tablet Take 1 tablet (500 mg total) by mouth 2 (two) times daily. 60 tablet 0   mirtazapine (REMERON) 30 MG tablet Take 1 tablet (30 mg total) by mouth at bedtime. 30 tablet 1   pantoprazole (PROTONIX) 40 MG tablet Take 1 tablet (40 mg total) by mouth daily. 30 tablet 2   sacubitril-valsartan (ENTRESTO) 24-26 MG Take 1 tablet by mouth 2 (two) times daily. 60 tablet 6   spironolactone (ALDACTONE) 25 MG tablet Take 1 tablet (25 mg total) by mouth daily. 30 tablet 6   thiamine (VITAMIN B1) 100 MG tablet Take 1 tablet (100 mg total) by mouth daily. 30 tablet 1   dapagliflozin propanediol (FARXIGA) 10 MG TABS tablet Take 1 tablet (10 mg total) by mouth daily. (Patient not taking: Reported on 01/26/2023) 30 tablet 6   No facility-administered  medications prior to visit.    Allergies  Allergen Reactions   Cucumber Extract Anaphylaxis   Keflex [Cephalexin] Nausea And Vomiting   Macrobid [Nitrofurantoin Monohydrate Macrocrystals] Hives and Swelling   Naproxen Nausea And Vomiting   Penicillins Nausea And Vomiting    Heavy vomitting Did PCN reaction causing immediate rash, facial/tongue/throat swelling, SOB or lightheadedness with hypotension: No swelling, but im not sure if i got a rash as i was red all over already Did PCN reaction causing severe rash involving mucus membranes or skin necrosis: No Did a PCN reaction that required hospitalization Yes went to the ED Has patient had a PCN reaction occurring  within the last 10 years: No-childhood allergy If all of the above answers are "NO", then may proceed with   Tramadol Nausea And Vomiting   Watermelon Concentrate Diarrhea   Darvocet [Propoxyphene N-Acetaminophen] Nausea And Vomiting and Other (See Comments)    migraines   Flagyl [Metronidazole Hcl] Hives, Swelling and Rash   Ketorolac Nausea And Vomiting and Other (See Comments)    migraines   Ultram [Tramadol Hcl] Nausea And Vomiting and Other (See Comments)    migraines   Vicodin [Hydrocodone-Acetaminophen] Nausea And Vomiting and Other (See Comments)    migraines    ROS Review of Systems  Constitutional:  Negative for activity change, appetite change, chills, fatigue and fever.  HENT:  Negative for congestion, dental problem, ear discharge, ear pain, hearing loss, rhinorrhea, sinus pressure, sinus pain, sneezing and sore throat.   Eyes:  Negative for pain, discharge, redness and itching.  Respiratory:  Negative for cough, chest tightness, shortness of breath and wheezing.   Cardiovascular:  Negative for chest pain, palpitations and leg swelling.  Gastrointestinal:  Negative for abdominal distention, abdominal pain, anal bleeding, blood in stool, constipation, diarrhea, nausea, rectal pain and vomiting.  Endocrine:  Negative for cold intolerance, heat intolerance, polydipsia, polyphagia and polyuria.  Genitourinary:  Negative for difficulty urinating, dysuria, flank pain, frequency, hematuria, menstrual problem, pelvic pain and vaginal bleeding.  Musculoskeletal:  Negative for arthralgias, back pain, gait problem, joint swelling and myalgias.  Skin:  Negative for color change, pallor, rash and wound.  Allergic/Immunologic: Negative for environmental allergies, food allergies and immunocompromised state.  Neurological:  Negative for dizziness, tremors, facial asymmetry, weakness and headaches.  Hematological:  Negative for adenopathy. Does not bruise/bleed easily.  Psychiatric/Behavioral:  Negative for agitation, behavioral problems, confusion, decreased concentration, hallucinations, self-injury and suicidal ideas.       Objective:    Physical Exam Vitals and nursing note reviewed. Exam conducted with a chaperone present.  Constitutional:      General: She is not in acute distress.    Appearance: Normal appearance. She is not ill-appearing, toxic-appearing or diaphoretic.  HENT:     Mouth/Throat:     Mouth: Mucous membranes are moist.     Pharynx: Oropharynx is clear. No oropharyngeal exudate or posterior oropharyngeal erythema.  Eyes:     General: No scleral icterus.       Right eye: No discharge.        Left eye: No discharge.     Extraocular Movements: Extraocular movements intact.     Conjunctiva/sclera: Conjunctivae normal.  Cardiovascular:     Rate and Rhythm: Normal rate and regular rhythm.     Pulses: Normal pulses.     Heart sounds: Normal heart sounds. No murmur heard.    No friction rub. No gallop.  Pulmonary:     Effort: Pulmonary effort is normal. No respiratory distress.     Breath sounds: Normal breath sounds. No stridor. No wheezing, rhonchi or rales.  Chest:     Chest wall: No mass, lacerations, deformity, swelling, tenderness or edema.  Breasts:    Tanner Score is 5.      Breasts are symmetrical.     Right: Normal. No swelling, bleeding, inverted nipple, mass, nipple discharge, skin change or tenderness.     Left: Normal. No swelling, bleeding, inverted nipple, mass, nipple discharge, skin change or tenderness.  Abdominal:     General: There is no distension.     Palpations: Abdomen is soft.     Tenderness: There is  no abdominal tenderness. There is no right CVA tenderness, left CVA tenderness or guarding.     Hernia: There is no hernia in the left inguinal area or right inguinal area.  Genitourinary:    General: Normal vulva.     Exam position: Lithotomy position.     Pubic Area: No rash or pubic lice.      Tanner stage (genital): 5.     Labia:        Right: No rash, tenderness, lesion or injury.        Left: No rash, tenderness, lesion or injury.      Urethra: No prolapse, urethral pain, urethral swelling or urethral lesion.     Vagina: No signs of injury and foreign body. No vaginal discharge, erythema, tenderness, bleeding, lesions or prolapsed vaginal walls.     Cervix: No cervical motion tenderness, discharge, friability, lesion, erythema, cervical bleeding or eversion.     Uterus: Normal. Not enlarged, not fixed, not tender and no uterine prolapse.      Adnexa:        Right: No mass, tenderness or fullness.         Left: No mass, tenderness or fullness.    Musculoskeletal:        General: No swelling, tenderness, deformity or signs of injury.     Right lower leg: No edema.     Left lower leg: No edema.  Lymphadenopathy:     Upper Body:     Right upper body: No supraclavicular, axillary or pectoral adenopathy.     Left upper body: No supraclavicular, axillary or pectoral adenopathy.     Lower Body: No right inguinal adenopathy. No left inguinal adenopathy.  Skin:    General: Skin is warm and dry.     Capillary Refill: Capillary refill takes 2 to 3 seconds.     Coloration: Skin is not jaundiced or pale.     Findings: No bruising, erythema or  lesion.  Neurological:     Mental Status: She is alert and oriented to person, place, and time.     Motor: No weakness.     Coordination: Coordination normal.     Gait: Gait normal.  Psychiatric:        Mood and Affect: Mood normal.        Behavior: Behavior normal.        Thought Content: Thought content normal.        Judgment: Judgment normal.     BP 101/66   Pulse 79   Temp (!) 97.2 F (36.2 C)   Ht 5\' 5"  (1.651 m)   Wt 98 lb 9.6 oz (44.7 kg)   SpO2 100%   BMI 16.41 kg/m  Wt Readings from Last 3 Encounters:  01/26/23 98 lb 9.6 oz (44.7 kg)  09/25/22 97 lb 6.4 oz (44.2 kg)  09/25/22 97 lb 9.6 oz (44.3 kg)    Lab Results  Component Value Date   TSH 1.258 05/01/2022   Lab Results  Component Value Date   WBC 7.3 06/26/2022   HGB 12.3 06/26/2022   HCT 37.1 06/26/2022   MCV 94 06/26/2022   PLT 306 06/26/2022   Lab Results  Component Value Date   NA 134 (L) 12/25/2022   K 3.6 12/25/2022   CO2 22 12/25/2022   GLUCOSE 89 12/25/2022   BUN 6 12/25/2022   CREATININE 0.95 12/25/2022   BILITOT 0.3 06/26/2022   ALKPHOS 87 06/26/2022   AST 40 06/26/2022   ALT  22 06/26/2022   PROT 7.3 06/26/2022   ALBUMIN 4.9 06/26/2022   CALCIUM 9.1 12/25/2022   ANIONGAP 8 12/25/2022   EGFR 112 06/26/2022   No results found for: "CHOL" No results found for: "HDL" No results found for: "LDLCALC" No results found for: "TRIG" No results found for: "CHOLHDL" No results found for: "HGBA1C"    Assessment & Plan:   Problem List Items Addressed This Visit       Cardiovascular and Mediastinum   Chronic systolic heart failure (HCC)    Continue current medications Encouraged to have close follow-up with cardiology        Other   Screening mammogram for breast cancer - Primary   Relevant Orders   MM Digital Screening   Screening for cervical cancer   Relevant Orders   Cytology - PAP([place])    No orders of the defined types were placed in this  encounter.   Follow-up: No follow-ups on file.    Donell Beers, FNP

## 2023-01-26 NOTE — Patient Instructions (Signed)
1. Screening mammogram for breast cancer  - MM Digital Screening; Future  2. Screening for cervical cancer  - Cytology - PAP([place])    It is important that you exercise regularly at least 30 minutes 5 times a week as tolerated  Think about what you will eat, plan ahead. Choose " clean, green, fresh or frozen" over canned, processed or packaged foods which are more sugary, salty and fatty. 70 to 75% of food eaten should be vegetables and fruit. Three meals at set times with snacks allowed between meals, but they must be fruit or vegetables. Aim to eat over a 12 hour period , example 7 am to 7 pm, and STOP after  your last meal of the day. Drink water,generally about 64 ounces per day, no other drink is as healthy. Fruit juice is best enjoyed in a healthy way, by EATING the fruit.  Thanks for choosing [facility] we consider it a privelige to serve you.

## 2023-02-01 LAB — CYTOLOGY - PAP
Comment: NEGATIVE
High risk HPV: POSITIVE — AB

## 2023-02-08 ENCOUNTER — Other Ambulatory Visit: Payer: Self-pay | Admitting: Nurse Practitioner

## 2023-02-08 DIAGNOSIS — R87618 Other abnormal cytological findings on specimens from cervix uteri: Secondary | ICD-10-CM

## 2023-02-11 ENCOUNTER — Telehealth (INDEPENDENT_AMBULATORY_CARE_PROVIDER_SITE_OTHER): Payer: Medicaid Other | Admitting: Physician Assistant

## 2023-02-11 DIAGNOSIS — F411 Generalized anxiety disorder: Secondary | ICD-10-CM

## 2023-02-11 DIAGNOSIS — F331 Major depressive disorder, recurrent, moderate: Secondary | ICD-10-CM

## 2023-02-12 ENCOUNTER — Encounter (HOSPITAL_COMMUNITY): Payer: Self-pay | Admitting: Physician Assistant

## 2023-02-12 MED ORDER — MIRTAZAPINE 30 MG PO TABS
30.0000 mg | ORAL_TABLET | Freq: Every day | ORAL | 1 refills | Status: DC
Start: 2023-02-12 — End: 2023-04-02

## 2023-02-12 MED ORDER — BUSPIRONE HCL 10 MG PO TABS
20.0000 mg | ORAL_TABLET | Freq: Two times a day (BID) | ORAL | 1 refills | Status: DC
Start: 2023-02-12 — End: 2023-04-02

## 2023-02-12 MED ORDER — HYDROXYZINE HCL 10 MG PO TABS: 10.0000 mg | ORAL_TABLET | Freq: Three times a day (TID) | ORAL | 1 refills | Status: DC | PRN

## 2023-02-12 NOTE — Progress Notes (Signed)
BH MD/PA/NP OP Progress Note  Virtual Visit via Video Note  I connected with Jamie Shields on 02/12/23 at  1:00 PM EDT by a video enabled telemedicine application and verified that I am speaking with the correct person using two identifiers.  Location: Patient: Home Provider: Clinic   I discussed the limitations of evaluation and management by telemedicine and the availability of in person appointments. The patient expressed understanding and agreed to proceed.  Follow Up Instructions:   I discussed the assessment and treatment plan with the patient. The patient was provided an opportunity to ask questions and all were answered. The patient agreed with the plan and demonstrated an understanding of the instructions.   The patient was advised to call back or seek an in-person evaluation if the symptoms worsen or if the condition fails to improve as anticipated.  I provided 22 minutes of non-face-to-face time during this encounter.  Meta Hatchet, PA    02/12/2023 8:55 AM Jamie Shields  MRN:  [id number]  Chief Complaint:  Chief Complaint  Patient presents with   Follow-up   Medication Refill   HPI:   Jamie Shields is a 42 year old, Caucasian female with a past psychiatric history significant for major depressive disorder with anxious distress, and generalized anxiety disorder who presents to [facility] via virtual video visit for follow-up and medication management.  Patient is currently being managed on the following psychiatric medications:  Mirtazapine 15 mg at bedtime Hydroxyzine 10 mg 3 times daily as needed BuSpar 10 mg 2 times daily  Patient reports that the last two weeks have been horrible for her. Patient reports that she is stressed and feels that her medications do not appear to be working. Patient's stress stems from issues with her family. Patient reports that her family used to be close knit, but now her family does not  want anything to do with her. She states that most of her family has moved up [place] and since they've moved, she reports that they are not doing too well. Patient reports that she just found out that her brother is in the hospital and that her mother and nephew are currently living in a hotel in [place] but she doesn't know the exact location.   In addition to issues with her family, patient also adds that she has been having seizures in her sleep per her husband. She also states that she and her husband will be attending a memorial service for her husband's grandmother. Although patient endorses several stressors, she denies being depressed and states that she has a positive outlook on life. Patient does endorse anxiety and rates her anxiety an 8 out of 10. Patient also finds herself being forgetful stating that she will forget to do a task that she is currently doing just to do another task. A PHQ-9 screen was performed with the patient scoring a 13. A GAD-7 screen was performed with the patient scoring a 19.  Patient is alert and oriented x 4, calm, cooperative, and fully engaged in conversation during the encounter. Patient endorses anxiousness. Patient denies suicidal or homicidal ideations. Patient further denies auditory or visual hallucinations and does not appear to be responding to internal/external stimuli. Patient endorses poor sleep and receives on average 4.5 hours of sleep per night. Patient endorses good appetite and eats on average 5 meals per day including snacks. Patient endorses alcohol consumption sparingly. Patient denies tobacco use but does engage in vaping. Patient denies  illicit drug use.  Visit Diagnosis:    ICD-10-CM   1. Generalized anxiety disorder  F41.1 hydrOXYzine (ATARAX) 10 MG tablet    busPIRone (BUSPAR) 10 MG tablet    2. Major depressive disorder, recurrent episode, moderate with anxious distress (HCC)  F33.1 mirtazapine (REMERON) 30 MG tablet      Past Psychiatric  History:  Patient reports that she has been diagnosed with the following psychiatric illnesses: bipolar disorder, panic attack, anxiety, and depression. Patient is currently diagnosed with major depressive disorder and generalized anxiety disorder   Patient denies a past history of hospitalization due to mental health   Patient denies past history of suicide attempts  Patient denies a past history of homicide  Past Medical History:  Past Medical History:  Diagnosis Date   Anxiety    Bipolar 1 disorder (HCC)    Bursitis of hip    Chronic bronchitis    Hip joint pain    Seizures (HCC)    Shock (HCC)    hypovolemic    Past Surgical History:  Procedure Laterality Date   BIOPSY  05/07/2022   Procedure: BIOPSY;  Surgeon: Benancio Deeds, MD;  Location: [facility] ENDOSCOPY;  Service: Gastroenterology;;   Thressa Sheller STUDY  06/03/2022   Procedure: BUBBLE STUDY;  Surgeon: Laurey Morale, MD;  Location: [facility] ENDOSCOPY;  Service: Cardiovascular;;   CESAREAN SECTION     2002   ESOPHAGOGASTRODUODENOSCOPY (EGD) WITH PROPOFOL N/A 05/07/2022   Procedure: ESOPHAGOGASTRODUODENOSCOPY (EGD) WITH PROPOFOL;  Surgeon: Benancio Deeds, MD;  Location: [facility] ENDOSCOPY;  Service: Gastroenterology;  Laterality: N/A;   KNEE ARTHROSCOPY     MOUTH SURGERY  2010   RIGHT/LEFT HEART CATH AND CORONARY ANGIOGRAPHY N/A 06/02/2022   Procedure: RIGHT/LEFT HEART CATH AND CORONARY ANGIOGRAPHY;  Surgeon: Laurey Morale, MD;  Location: [facility] INVASIVE CV LAB;  Service: Cardiovascular;  Laterality: N/A;   TEE WITHOUT CARDIOVERSION N/A 06/03/2022   Procedure: TRANSESOPHAGEAL ECHOCARDIOGRAM (TEE);  Surgeon: Laurey Morale, MD;  Location: [facility] ENDOSCOPY;  Service: Cardiovascular;  Laterality: N/A;    Family Psychiatric History:  Father - depression Mother - depression Grandmother (paternal) - depression Brother - panic attacks  Family History:  Family History  Problem Relation Age of Onset   Heart attack Mother     Hypertension Mother    Heart attack Father    Heart attack Maternal Grandfather    Heart attack Paternal Grandfather    Diabetes Other    Cancer Other     Social History:  Social History   Socioeconomic History   Marital status: Legally Separated    Spouse name: Not on file   Number of children: Not on file   Years of education: Not on file   Highest education level: Associate degree: occupational, Scientist, product/process development, or vocational program  Occupational History   Not on file  Tobacco Use   Smoking status: Former    Current packs/day: 0.50    Types: Cigarettes   Smokeless tobacco: Never   Tobacco comments:    Former smoker 07/03/22  Vaping Use   Vaping status: Some Days  Substance and Sexual Activity   Alcohol use: Yes    Comment: rarely   Drug use: No   Sexual activity: Never    Birth control/protection: None  Other Topics Concern   Not on file  Social History Narrative   Not on file   Social Determinants of Health   Financial Resource Strain: High Risk (01/12/2023)   Overall Financial Resource Strain (CARDIA)  Difficulty of Paying Living Expenses: Hard  Food Insecurity: Food Insecurity Present (01/12/2023)   Hunger Vital Sign    Worried About Running Out of Food in the Last Year: Sometimes true    Ran Out of Food in the Last Year: Sometimes true  Transportation Needs: No Transportation Needs (01/12/2023)   PRAPARE - Administrator, Civil Service (Medical): No    Lack of Transportation (Non-Medical): No  Physical Activity: Unknown (01/12/2023)   Exercise Vital Sign    Days of Exercise per Week: 5 days    Minutes of Exercise per Session: Patient declined  Stress: Stress Concern Present (01/12/2023)   [employer] of Occupational Health - Occupational Stress Questionnaire    Feeling of Stress : Very much  Social Connections: Unknown (01/12/2023)   Social Connection and Isolation Panel [NHANES]    Frequency of Communication with Friends and Family: Once a  week    Frequency of Social Gatherings with Friends and Family: Patient declined    Attends Religious Services: 1 to 4 times per year    Active Member of [employer] or Organizations: No    Attends Engineer, structural: Not on file    Marital Status: Separated    Allergies:  Allergies  Allergen Reactions   Cucumber Extract Anaphylaxis   Keflex [Cephalexin] Nausea And Vomiting   Macrobid [Nitrofurantoin Monohydrate Macrocrystals] Hives and Swelling   Naproxen Nausea And Vomiting   Penicillins Nausea And Vomiting    Heavy vomitting Did PCN reaction causing immediate rash, facial/tongue/throat swelling, SOB or lightheadedness with hypotension: No swelling, but im not sure if i got a rash as i was red all over already Did PCN reaction causing severe rash involving mucus membranes or skin necrosis: No Did a PCN reaction that required hospitalization Yes went to the ED Has patient had a PCN reaction occurring within the last 10 years: No-childhood allergy If all of the above answers are "NO", then may proceed with   Tramadol Nausea And Vomiting   Watermelon Concentrate Diarrhea   Darvocet [Propoxyphene N-Acetaminophen] Nausea And Vomiting and Other (See Comments)    migraines   Flagyl [Metronidazole Hcl] Hives, Swelling and Rash   Ketorolac Nausea And Vomiting and Other (See Comments)    migraines   Ultram [Tramadol Hcl] Nausea And Vomiting and Other (See Comments)    migraines   Vicodin [Hydrocodone-Acetaminophen] Nausea And Vomiting and Other (See Comments)    migraines    Metabolic Disorder Labs: No results found for: "HGBA1C", "MPG" No results found for: "PROLACTIN" No results found for: "CHOL", "TRIG", "HDL", "CHOLHDL", "VLDL", "LDLCALC" Lab Results  Component Value Date   TSH 1.258 05/01/2022    Therapeutic Level Labs: No results found for: "LITHIUM" No results found for: "VALPROATE" No results found for: "CBMZ"  Current Medications: Current Outpatient Medications   Medication Sig Dispense Refill   busPIRone (BUSPAR) 10 MG tablet Take 2 tablets (20 mg total) by mouth 2 (two) times daily. 120 tablet 1   carvedilol (COREG) 6.25 MG tablet Take 1 tablet (6.25 mg total) by mouth 2 (two) times daily. 180 tablet 3   dapagliflozin propanediol (FARXIGA) 10 MG TABS tablet Take 1 tablet (10 mg total) by mouth daily. (Patient not taking: Reported on 01/26/2023) 30 tablet 6   fluticasone (FLONASE) 50 MCG/ACT nasal spray Place 2 sprays into both nostrils daily. 16 g 6   furosemide (LASIX) 20 MG tablet TAKE 1 TABLET (20 MG) BY MOUTH DAILY 30 tablet 1   hydrOXYzine (  ATARAX) 10 MG tablet Take 1 tablet (10 mg total) by mouth 3 (three) times daily as needed. 75 tablet 1   levETIRAcetam (KEPPRA) 500 MG tablet Take 1 tablet (500 mg total) by mouth 2 (two) times daily. 60 tablet 0   mirtazapine (REMERON) 30 MG tablet Take 1 tablet (30 mg total) by mouth at bedtime. 30 tablet 1   pantoprazole (PROTONIX) 40 MG tablet Take 1 tablet (40 mg total) by mouth daily. 30 tablet 2   sacubitril-valsartan (ENTRESTO) 24-26 MG Take 1 tablet by mouth 2 (two) times daily. 60 tablet 6   spironolactone (ALDACTONE) 25 MG tablet Take 1 tablet (25 mg total) by mouth daily. 30 tablet 6   thiamine (VITAMIN B1) 100 MG tablet Take 1 tablet (100 mg total) by mouth daily. 30 tablet 1   No current facility-administered medications for this visit.     Musculoskeletal: Strength & Muscle Tone: within normal limits Gait & Station: normal Patient leans: N/A  Psychiatric Specialty Exam: Review of Systems  Psychiatric/Behavioral:  Positive for agitation and sleep disturbance. Negative for decreased concentration, dysphoric mood, hallucinations, self-injury and suicidal ideas. The patient is nervous/anxious. The patient is not hyperactive.     There were no vitals taken for this visit.There is no height or weight on file to calculate BMI.  General Appearance: Casual  Eye Contact:  Good  Speech:  Clear and  Coherent and Normal Rate  Volume:  Normal  Mood:  Anxious and Irritable  Affect:  Congruent  Thought Process:  Coherent and Descriptions of Associations: Intact  Orientation:  Full (Time, Place, and Person)  Thought Content: WDL   Suicidal Thoughts:  No  Homicidal Thoughts:  No  Memory:  Immediate;   Good Recent;   Good Remote;   Good  Judgement:  Good  Insight:  Good  Psychomotor Activity:  Normal  Concentration:  Concentration: Good and Attention Span: Good  Recall:  Good  Fund of Knowledge: Good  Language: Good  Akathisia:  No  Handed:  Ambidextrous  AIMS (if indicated): not done  Assets:  Communication Skills Desire for Improvement Housing Physical Health Social Support Transportation Vocational/Educational  ADL's:  Intact  Cognition: WNL  Sleep:  Fair   Screenings: AUDIT    Flowsheet Row Appointment from 01/13/2023 in [facility]  Alcohol Use Disorder Identification Test Final Score (AUDIT) 5      GAD-7    Flowsheet Row Video Visit from 02/11/2023 in [facility] Video Visit from 12/31/2022 in [facility] Video Visit from 11/19/2022 in [facility] Office Visit from 10/08/2022 in [facility] Counselor from 09/04/2022 in [facility]  Total GAD-7 Score 19 19 20 18 20       PHQ2-9    Flowsheet Row Video Visit from 02/11/2023 in [facility] Office Visit from 01/26/2023 in [facility] Video Visit from 12/31/2022 in [facility] Video Visit from 11/19/2022 in [facility] Office Visit from 10/08/2022 in [facility]  PHQ-2 Total Score 2 0 1 0 3  PHQ-9 Total Score 13 6 -- -- 15      Flowsheet Row Video Visit from 02/11/2023 in [facility] Video Visit from  12/31/2022 in [facility] Video Visit from 11/19/2022 in [facility]  C-SSRS RISK CATEGORY Low Risk  Low Risk No Risk        Assessment and Plan:   Jamie Shields is a 42 year old, Caucasian female with a past psychiatric history significant for major depressive disorder with anxious distress, and generalized anxiety disorder who presents to [facility] via virtual video visit for follow-up and medication management. Patient presents to the encounter endorsing anxiety due to stressors in her life stemming from family issues and her health. Although patient scored a 13 on her PHQ-9 screen, she denies depressive symptoms stating that she has a positive outlook on life. Patient does not believe that her medications are effective in managing her symptoms of anxiety and sleep issues. Provider discussed with patient about being placed on clonazepam to manage her uncontrolled anxiety; however; patient refused stating that clonazepam makes her irritable. Patient was agreeable to having a Genesight test performed to assess what medications are compatible with the patient's genetics. Patient to continue taking her medications as prescribed. Patient's medications to be e-prescribed to pharmacy of choice.  Collaboration of Care: Collaboration of Care: Medication Management AEB provider managing patient's psychiatric medications, Psychiatrist AEB patient being seen by mental health provider at this facility, and Referral or follow-up with counselor/therapist AEB patient being seen by a licensed clinical social worker at this facility  Patient/Guardian was advised Release of Information must be obtained prior to any record release in order to collaborate their care with an outside provider. Patient/Guardian was advised if they have not already done so to contact the registration department to sign all necessary forms in  order for [place] to release information regarding their care.   Consent: Patient/Guardian gives verbal consent for treatment and assignment of benefits for services provided during this visit. Patient/Guardian expressed understanding and agreed to proceed.   1. Generalized anxiety disorder  - hydrOXYzine (ATARAX) 10 MG tablet; Take 1 tablet (10 mg total) by mouth 3 (three) times daily as needed.  Dispense: 75 tablet; Refill: 1 - busPIRone (BUSPAR) 10 MG tablet; Take 2 tablets (20 mg total) by mouth 2 (two) times daily.  Dispense: 120 tablet; Refill: 1  2. Major depressive disorder, recurrent episode, moderate with anxious distress (HCC)  - mirtazapine (REMERON) 30 MG tablet; Take 1 tablet (30 mg total) by mouth at bedtime.  Dispense: 30 tablet; Refill: 1  Patient to follow-up in 6 weeks Provider spent a total of 22 minutes with the patient/reviewing patient's chart  Meta Hatchet, PA 02/12/2023, 8:55 AM

## 2023-02-25 DIAGNOSIS — Z419 Encounter for procedure for purposes other than remedying health state, unspecified: Secondary | ICD-10-CM | POA: Diagnosis not present

## 2023-03-24 ENCOUNTER — Telehealth (HOSPITAL_COMMUNITY): Payer: Medicaid Other | Admitting: Physician Assistant

## 2023-03-24 ENCOUNTER — Encounter (HOSPITAL_COMMUNITY): Payer: Self-pay

## 2023-03-28 DIAGNOSIS — Z419 Encounter for procedure for purposes other than remedying health state, unspecified: Secondary | ICD-10-CM | POA: Diagnosis not present

## 2023-04-02 ENCOUNTER — Ambulatory Visit (INDEPENDENT_AMBULATORY_CARE_PROVIDER_SITE_OTHER): Payer: Medicaid Other | Admitting: Physician Assistant

## 2023-04-02 ENCOUNTER — Encounter (HOSPITAL_COMMUNITY): Payer: Self-pay | Admitting: Physician Assistant

## 2023-04-02 ENCOUNTER — Other Ambulatory Visit (HOSPITAL_COMMUNITY): Payer: Self-pay | Admitting: Family Medicine

## 2023-04-02 ENCOUNTER — Ambulatory Visit: Payer: Self-pay | Admitting: Nurse Practitioner

## 2023-04-02 DIAGNOSIS — F411 Generalized anxiety disorder: Secondary | ICD-10-CM | POA: Diagnosis not present

## 2023-04-02 DIAGNOSIS — F331 Major depressive disorder, recurrent, moderate: Secondary | ICD-10-CM

## 2023-04-02 MED ORDER — HYDROXYZINE HCL 10 MG PO TABS
10.0000 mg | ORAL_TABLET | Freq: Three times a day (TID) | ORAL | 1 refills | Status: DC | PRN
Start: 2023-04-02 — End: 2023-10-06

## 2023-04-02 MED ORDER — BUSPIRONE HCL 10 MG PO TABS
20.0000 mg | ORAL_TABLET | Freq: Two times a day (BID) | ORAL | 1 refills | Status: DC
Start: 2023-04-02 — End: 2023-06-18

## 2023-04-02 MED ORDER — MIRTAZAPINE 30 MG PO TABS
30.0000 mg | ORAL_TABLET | Freq: Every day | ORAL | 1 refills | Status: DC
Start: 2023-04-02 — End: 2023-06-08

## 2023-04-02 NOTE — Progress Notes (Unsigned)
Met with patient after her evaluation with Otila Back, PA-C to assist with completing ordered genesight testing.  Patient signed consent forms and completed buccal swab testing.  Orders placed in [email or web address] and pick up of sample arranged for today between 4-5 pm at the [facility] second floor outpatient.  Patient with no complaint of pain or discomfort with testing and informed results typically take approximately 1 week.

## 2023-04-02 NOTE — Progress Notes (Unsigned)
BH MD/PA/NP OP Progress Note  Virtual Visit via Video Note  I connected with Jamie Shields on 04/02/23 at  1:30 PM EDT by a video enabled telemedicine application and verified that I am speaking with the correct person using two identifiers.  Location: Patient: Home Provider: Clinic   I discussed the limitations of evaluation and management by telemedicine and the availability of in person appointments. The patient expressed understanding and agreed to proceed.  Follow Up Instructions:   I discussed the assessment and treatment plan with the patient. The patient was provided an opportunity to ask questions and all were answered. The patient agreed with the plan and demonstrated an understanding of the instructions.   The patient was advised to call back or seek an in-person evaluation if the symptoms worsen or if the condition fails to improve as anticipated.  I provided 12 minutes of non-face-to-face time during this encounter.  Meta Hatchet, PA    04/02/2023 7:13 PM Jamie Shields  MRN:  [id number]  Chief Complaint:  Chief Complaint  Patient presents with   Follow-up   Medication Refill   HPI:   Jamie Shields is a 42 year old, Caucasian female with a past psychiatric history significant for major depressive disorder with anxious distress, and generalized anxiety disorder who presents to [facility] via virtual video visit for follow-up and medication management.  Patient is currently being managed on the following psychiatric medications:  Mirtazapine 15 mg at bedtime Hydroxyzine 10 mg 3 times daily as needed BuSpar 10 mg 2 times daily  Patient reports that her mood has been a hit or miss.  She denies depression but states that she has been trying to figure out things in her life lately.  She endorses elevated anxiety and endorses the following stressors: patient's brother extending his rehab from 45 days to 7 and trying to figure  out how to get her mother into assisted living.  A GAD-7 screen was performed with the patient scoring a 20.  Patient is alert and oriented x 4, calm, cooperative, and fully engaged in conversation during the encounter.  Patient describes her mood as tired stating that she has been at work since 4:30 AM.  Patient denies suicidal or homicidal ideations.  She endorses visual hallucinations characterized by seeing tracers that look like specks of light.  She denied auditory hallucinations and does not appear to be responding to internal/external stimuli.  Patient endorses fair sleep and receives on average 6 hours of sleep per night.  Patient endorses good appetite and eats on average 3 meals a day as well as snacks throughout the day.  Patient denies tobacco use but does engage in vaping.  Patient denies alcohol consumption or illicit drug use.  Visit Diagnosis:    ICD-10-CM   1. Major depressive disorder, recurrent episode, moderate with anxious distress (HCC)  F33.1 mirtazapine (REMERON) 30 MG tablet    2. Generalized anxiety disorder  F41.1 busPIRone (BUSPAR) 10 MG tablet    hydrOXYzine (ATARAX) 10 MG tablet       Past Psychiatric History:  Patient reports that she has been diagnosed with the following psychiatric illnesses: bipolar disorder, panic attack, anxiety, and depression. Patient is currently diagnosed with major depressive disorder and generalized anxiety disorder   Patient denies a past history of hospitalization due to mental health   Patient denies past history of suicide attempts  Patient denies a past history of homicide  Past Medical History:  Past  Medical History:  Diagnosis Date   Anxiety    Bipolar 1 disorder (HCC)    Bursitis of hip    Chronic bronchitis    Hip joint pain    Seizures (HCC)    Shock (HCC)    hypovolemic    Past Surgical History:  Procedure Laterality Date   BIOPSY  05/07/2022   Procedure: BIOPSY;  Surgeon: Benancio Deeds, MD;  Location:  [facility] ENDOSCOPY;  Service: Gastroenterology;;   BUBBLE STUDY  06/03/2022   Procedure: BUBBLE STUDY;  Surgeon: Laurey Morale, MD;  Location: [facility] ENDOSCOPY;  Service: Cardiovascular;;   CESAREAN SECTION     2002   ESOPHAGOGASTRODUODENOSCOPY (EGD) WITH PROPOFOL N/A 05/07/2022   Procedure: ESOPHAGOGASTRODUODENOSCOPY (EGD) WITH PROPOFOL;  Surgeon: Benancio Deeds, MD;  Location: [facility] ENDOSCOPY;  Service: Gastroenterology;  Laterality: N/A;   KNEE ARTHROSCOPY     MOUTH SURGERY  2010   RIGHT/LEFT HEART CATH AND CORONARY ANGIOGRAPHY N/A 06/02/2022   Procedure: RIGHT/LEFT HEART CATH AND CORONARY ANGIOGRAPHY;  Surgeon: Laurey Morale, MD;  Location: [facility] INVASIVE CV LAB;  Service: Cardiovascular;  Laterality: N/A;   TEE WITHOUT CARDIOVERSION N/A 06/03/2022   Procedure: TRANSESOPHAGEAL ECHOCARDIOGRAM (TEE);  Surgeon: Laurey Morale, MD;  Location: [facility] ENDOSCOPY;  Service: Cardiovascular;  Laterality: N/A;    Family Psychiatric History:  Father - depression Mother - depression Grandmother (paternal) - depression Brother - panic attacks  Family History:  Family History  Problem Relation Age of Onset   Heart attack Mother    Hypertension Mother    Heart attack Father    Heart attack Maternal Grandfather    Heart attack Paternal Grandfather    Diabetes Other    Cancer Other     Social History:  Social History   Socioeconomic History   Marital status: Legally Separated    Spouse name: Not on file   Number of children: Not on file   Years of education: Not on file   Highest education level: Associate degree: occupational, Scientist, product/process development, or vocational program  Occupational History   Not on file  Tobacco Use   Smoking status: Former    Current packs/day: 0.50    Types: Cigarettes, E-cigarettes    Start date: 2018   Smokeless tobacco: Never   Tobacco comments:    Former smoker 07/03/22  Vaping Use   Vaping status: Some Days  Substance and Sexual Activity   Alcohol use: Not Currently     Comment: rarely   Drug use: Not Currently   Sexual activity: Yes    Birth control/protection: None  Other Topics Concern   Not on file  Social History Narrative   Not on file   Social Determinants of Health   Financial Resource Strain: High Risk (01/12/2023)   Overall Financial Resource Strain (CARDIA)    Difficulty of Paying Living Expenses: Hard  Food Insecurity: Food Insecurity Present (01/12/2023)   Hunger Vital Sign    Worried About Running Out of Food in the Last Year: Sometimes true    Ran Out of Food in the Last Year: Sometimes true  Transportation Needs: No Transportation Needs (01/12/2023)   PRAPARE - Administrator, Civil Service (Medical): No    Lack of Transportation (Non-Medical): No  Physical Activity: Unknown (01/12/2023)   Exercise Vital Sign    Days of Exercise per Week: 5 days    Minutes of Exercise per Session: Patient declined  Stress: Stress Concern Present (01/12/2023)   [employer] of Occupational Health -  Occupational Stress Questionnaire    Feeling of Stress : Very much  Social Connections: Unknown (01/12/2023)   Social Connection and Isolation Panel [NHANES]    Frequency of Communication with Friends and Family: Once a week    Frequency of Social Gatherings with Friends and Family: Patient declined    Attends Religious Services: 1 to 4 times per year    Active Member of [employer] or Organizations: No    Attends Engineer, structural: Not on file    Marital Status: Separated    Allergies:  Allergies  Allergen Reactions   Cucumber Extract Anaphylaxis   Keflex [Cephalexin] Nausea And Vomiting   Macrobid [Nitrofurantoin Monohydrate Macrocrystals] Hives and Swelling   Naproxen Nausea And Vomiting   Penicillins Nausea And Vomiting    Heavy vomitting Did PCN reaction causing immediate rash, facial/tongue/throat swelling, SOB or lightheadedness with hypotension: No swelling, but im not sure if i got a rash as i was red all over  already Did PCN reaction causing severe rash involving mucus membranes or skin necrosis: No Did a PCN reaction that required hospitalization Yes went to the ED Has patient had a PCN reaction occurring within the last 10 years: No-childhood allergy If all of the above answers are "NO", then may proceed with   Tramadol Nausea And Vomiting   Watermelon Concentrate Diarrhea   Darvocet [Propoxyphene N-Acetaminophen] Nausea And Vomiting and Other (See Comments)    migraines   Flagyl [Metronidazole Hcl] Hives, Swelling and Rash   Ketorolac Nausea And Vomiting and Other (See Comments)    migraines   Ultram [Tramadol Hcl] Nausea And Vomiting and Other (See Comments)    migraines   Vicodin [Hydrocodone-Acetaminophen] Nausea And Vomiting and Other (See Comments)    migraines    Metabolic Disorder Labs: No results found for: "HGBA1C", "MPG" No results found for: "PROLACTIN" No results found for: "CHOL", "TRIG", "HDL", "CHOLHDL", "VLDL", "LDLCALC" Lab Results  Component Value Date   TSH 1.258 05/01/2022    Therapeutic Level Labs: No results found for: "LITHIUM" No results found for: "VALPROATE" No results found for: "CBMZ"  Current Medications: Current Outpatient Medications  Medication Sig Dispense Refill   carvedilol (COREG) 6.25 MG tablet Take 1 tablet (6.25 mg total) by mouth 2 (two) times daily. 180 tablet 3   fluticasone (FLONASE) 50 MCG/ACT nasal spray Place 2 sprays into both nostrils daily. 16 g 6   furosemide (LASIX) 20 MG tablet TAKE 1 TABLET (20 MG) BY MOUTH DAILY 30 tablet 1   levETIRAcetam (KEPPRA) 500 MG tablet Take 1 tablet (500 mg total) by mouth 2 (two) times daily. 60 tablet 0   thiamine (VITAMIN B1) 100 MG tablet Take 1 tablet (100 mg total) by mouth daily. 30 tablet 1   busPIRone (BUSPAR) 10 MG tablet Take 2 tablets (20 mg total) by mouth 2 (two) times daily. 120 tablet 1   dapagliflozin propanediol (FARXIGA) 10 MG TABS tablet Take 1 tablet (10 mg total) by mouth  daily. (Patient not taking: Reported on 01/26/2023) 30 tablet 6   hydrOXYzine (ATARAX) 10 MG tablet Take 1 tablet (10 mg total) by mouth 3 (three) times daily as needed. 75 tablet 1   mirtazapine (REMERON) 30 MG tablet Take 1 tablet (30 mg total) by mouth at bedtime. 30 tablet 1   pantoprazole (PROTONIX) 40 MG tablet Take 1 tablet (40 mg total) by mouth daily. 30 tablet 2   sacubitril-valsartan (ENTRESTO) 24-26 MG Take 1 tablet by mouth 2 (two) times daily. (Patient  not taking: Reported on 04/02/2023) 60 tablet 6   spironolactone (ALDACTONE) 25 MG tablet Take 1 tablet (25 mg total) by mouth daily. (Patient not taking: Reported on 04/02/2023) 30 tablet 6   No current facility-administered medications for this visit.     Musculoskeletal: Strength & Muscle Tone: within normal limits Gait & Station: normal Patient leans: N/A  Psychiatric Specialty Exam: Review of Systems  Psychiatric/Behavioral:  Positive for sleep disturbance. Negative for decreased concentration, dysphoric mood, hallucinations, self-injury and suicidal ideas. The patient is nervous/anxious. The patient is not hyperactive.     Blood pressure (!) 146/80, pulse 73, temperature (!) 97.3 F (36.3 C), height 5' 5.5" (1.664 m), weight 103 lb (46.7 kg), SpO2 99%.Body mass index is 16.88 kg/m.  General Appearance: Casual  Eye Contact:  Good  Speech:  Clear and Coherent and Normal Rate  Volume:  Normal  Mood:  Anxious  Affect:  Congruent  Thought Process:  Coherent and Descriptions of Associations: Intact  Orientation:  Full (Time, Place, and Person)  Thought Content: WDL   Suicidal Thoughts:  No  Homicidal Thoughts:  No  Memory:  Immediate;   Good Recent;   Good Remote;   Good  Judgement:  Good  Insight:  Good  Psychomotor Activity:  Normal  Concentration:  Concentration: Good and Attention Span: Good  Recall:  Good  Fund of Knowledge: Good  Language: Good  Akathisia:  No  Handed:  Ambidextrous  AIMS (if indicated): not  done  Assets:  Communication Skills Desire for Improvement Housing Physical Health Social Support Transportation Vocational/Educational  ADL's:  Intact  Cognition: WNL  Sleep:  Fair   Screenings: AUDIT    Flowsheet Row Appointment from 01/13/2023 in [facility]  Alcohol Use Disorder Identification Test Final Score (AUDIT) 5      GAD-7    Flowsheet Row Clinical Support from 04/02/2023 in [facility] Video Visit from 02/11/2023 in [facility] Video Visit from 12/31/2022 in [facility] Video Visit from 11/19/2022 in [facility] Office Visit from 10/08/2022 in [facility]  Total GAD-7 Score 20 19 19 20 18       PHQ2-9    Flowsheet Row Clinical Support from 04/02/2023 in [facility] Video Visit from 02/11/2023 in [facility] Office Visit from 01/26/2023 in [facility] Video Visit from 12/31/2022 in [facility] Video Visit from 11/19/2022 in [facility]  PHQ-2 Total Score 1 2 0 1 0  PHQ-9 Total Score -- 13 6 -- --      Flowsheet Row Clinical Support from 04/02/2023 in [facility] Video Visit from 02/11/2023 in [facility] Video Visit from 12/31/2022 in [facility]  C-SSRS RISK CATEGORY Low Risk Low Risk Low Risk        Assessment and Plan:   Jamie Shields is a 42 year old, Caucasian female with a past psychiatric history significant for major depressive disorder with anxious distress, and generalized anxiety disorder who presents to [facility] via virtual video visit for follow-up and medication management.  Patient presents to the encounter stating that her mood has been a hit  or miss.  Although she denies depression she does endorse elevated anxiety due to stressors in her life.  Patient continues to take her medications  as prescribed and denies any issues or concerns with her medications.  Patient is interested in participating in [place] testing to determine what medications are compatible with her genetic profile.  Patient to be tested for [facility] testing following the conclusion of the encounter.  Patient to continue taking medications as prescribed.  Patient's medications to be e-prescribed through pharmacy of choice.  Collaboration of Care: Collaboration of Care: Medication Management AEB provider managing patient's psychiatric medications, Psychiatrist AEB patient being seen by mental health provider at this facility, and Referral or follow-up with counselor/therapist AEB patient being seen by a licensed clinical social worker at this facility  Patient/Guardian was advised Release of Information must be obtained prior to any record release in order to collaborate their care with an outside provider. Patient/Guardian was advised if they have not already done so to contact the registration department to sign all necessary forms in order for [place] to release information regarding their care.   Consent: Patient/Guardian gives verbal consent for treatment and assignment of benefits for services provided during this visit. Patient/Guardian expressed understanding and agreed to proceed.   1. Major depressive disorder, recurrent episode, moderate with anxious distress (HCC)  - mirtazapine (REMERON) 30 MG tablet; Take 1 tablet (30 mg total) by mouth at bedtime.  Dispense: 30 tablet; Refill: 1  2. Generalized anxiety disorder  - busPIRone (BUSPAR) 10 MG tablet; Take 2 tablets (20 mg total) by mouth 2 (two) times daily.  Dispense: 120 tablet; Refill: 1 - hydrOXYzine (ATARAX) 10 MG tablet; Take 1 tablet (10 mg total) by mouth 3 (three) times daily as needed.  Dispense: 75  tablet; Refill: 1  Patient to follow-up in 6 weeks Provider spent a total of 12 minutes with the patient/reviewing patient's chart  Meta Hatchet, PA 04/02/2023, 7:13 PM

## 2023-04-07 ENCOUNTER — Encounter: Payer: Self-pay | Admitting: Nurse Practitioner

## 2023-04-07 ENCOUNTER — Ambulatory Visit (INDEPENDENT_AMBULATORY_CARE_PROVIDER_SITE_OTHER): Payer: Medicaid Other | Admitting: Nurse Practitioner

## 2023-04-07 VITALS — BP 123/80 | HR 90 | Temp 98.0°F | Resp 12 | Ht 65.0 in | Wt 100.0 lb

## 2023-04-07 DIAGNOSIS — G8929 Other chronic pain: Secondary | ICD-10-CM | POA: Diagnosis not present

## 2023-04-07 DIAGNOSIS — M25512 Pain in left shoulder: Secondary | ICD-10-CM | POA: Diagnosis not present

## 2023-04-07 DIAGNOSIS — B370 Candidal stomatitis: Secondary | ICD-10-CM | POA: Diagnosis not present

## 2023-04-07 MED ORDER — NYSTATIN 100000 UNIT/ML MT SUSP
5.0000 mL | Freq: Four times a day (QID) | OROMUCOSAL | 0 refills | Status: DC
Start: 2023-04-07 — End: 2023-10-06

## 2023-04-07 MED ORDER — TIZANIDINE HCL 4 MG PO TABS
4.0000 mg | ORAL_TABLET | Freq: Four times a day (QID) | ORAL | 0 refills | Status: AC | PRN
Start: 2023-04-07 — End: ?

## 2023-04-07 NOTE — Progress Notes (Unsigned)
@Patient  ID: Jamie Shields, female    DOB: 10-20-1980, 42 y.o.   MRN: [id number]  Chief Complaint  Patient presents with   Follow-up    Referring provider: Ivonne Andrew, NP   HPI   Jamie Shields is a 42 y.o. female with PMH of bipolar disorder, substance abuse, chronic bronchitis, hypovolemic shock, protein calorie malnutrition, seizures, bipolar disorder, and substance abuse.    Patient presents today for follow-up visit.  She is complaining today of possible thrush to her mouth.  She states that she has been having to her tongue and mouth for the past week.  She did recently get new dentures.  We will trial nystatin suspension.  Patient also complains of left side neck and shoulder pain/strain.  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  She has to lift things above her head at times.  We will trial muscle relaxer. Denies f/c/s, n/v/d, hemoptysis, PND, leg swelling Denies chest pain or edema     Allergies  Allergen Reactions   Cucumber Extract Anaphylaxis   Keflex [Cephalexin] Nausea And Vomiting   Macrobid [Nitrofurantoin Monohydrate Macrocrystals] Hives and Swelling   Naproxen Nausea And Vomiting   Penicillins Nausea And Vomiting    Heavy vomitting Did PCN reaction causing immediate rash, facial/tongue/throat swelling, SOB or lightheadedness with hypotension: No swelling, but im not sure if i got a rash as i was red all over already Did PCN reaction causing severe rash involving mucus membranes or skin necrosis: No Did a PCN reaction that required hospitalization Yes went to the ED Has patient had a PCN reaction occurring within the last 10 years: No-childhood allergy If all of the above answers are "NO", then may proceed with   Tramadol Nausea And Vomiting   Watermelon Concentrate Diarrhea   Darvocet [Propoxyphene N-Acetaminophen] Nausea And Vomiting and Other (See Comments)    migraines   Flagyl [Metronidazole Hcl]  Hives, Swelling and Rash   Ketorolac Nausea And Vomiting and Other (See Comments)    migraines   Ultram [Tramadol Hcl] Nausea And Vomiting and Other (See Comments)    migraines   Vicodin [Hydrocodone-Acetaminophen] Nausea And Vomiting and Other (See Comments)    migraines     There is no immunization history on file for this patient.  Past Medical History:  Diagnosis Date   Anxiety    Bipolar 1 disorder (HCC)    Bursitis of hip    Chronic bronchitis    Hip joint pain    Seizures (HCC)    Shock (HCC)    hypovolemic    Tobacco History: Social History   Tobacco Use  Smoking Status Former   Current packs/day: 0.50   Types: Cigarettes, E-cigarettes   Start date: 2018  Smokeless Tobacco Never  Tobacco Comments   Former smoker 07/03/22   Counseling given: Not Answered Tobacco comments: Former smoker 07/03/22   Outpatient Encounter Medications as of 04/07/2023  Medication Sig   busPIRone (BUSPAR) 10 MG tablet Take 2 tablets (20 mg total) by mouth 2 (two) times daily.   carvedilol (COREG) 6.25 MG tablet Take 1 tablet (6.25 mg total) by mouth 2 (two) times daily.   dapagliflozin propanediol (FARXIGA) 10 MG TABS tablet TAKE 1 TABLET (10 MG TOTAL) BY MOUTH DAILY.   fluticasone (FLONASE) 50 MCG/ACT nasal spray Place 2 sprays into both nostrils daily.   furosemide (LASIX) 20 MG tablet TAKE 1 TABLET (20 MG) BY MOUTH DAILY  hydrOXYzine (ATARAX) 10 MG tablet Take 1 tablet (10 mg total) by mouth 3 (three) times daily as needed.   levETIRAcetam (KEPPRA) 500 MG tablet Take 1 tablet (500 mg total) by mouth 2 (two) times daily.   mirtazapine (REMERON) 30 MG tablet Take 1 tablet (30 mg total) by mouth at bedtime.   nystatin (MYCOSTATIN) 100000 UNIT/ML suspension Take 5 mLs (500,000 Units total) by mouth 4 (four) times daily.   sacubitril-valsartan (ENTRESTO) 24-26 MG Take 1 tablet by mouth 2 (two) times daily.   spironolactone (ALDACTONE) 25 MG tablet Take 1 tablet (25 mg total) by mouth  daily.   thiamine (VITAMIN B1) 100 MG tablet Take 1 tablet (100 mg total) by mouth daily.   tiZANidine (ZANAFLEX) 4 MG tablet Take 1 tablet (4 mg total) by mouth every 6 (six) hours as needed for muscle spasms.   pantoprazole (PROTONIX) 40 MG tablet Take 1 tablet (40 mg total) by mouth daily.   No facility-administered encounter medications on file as of 04/07/2023.     Review of Systems  Review of Systems  Constitutional: Negative.   HENT: Negative.    Cardiovascular: Negative.   Gastrointestinal: Negative.   Musculoskeletal:  Positive for neck pain.  Allergic/Immunologic: Negative.   Neurological: Negative.   Psychiatric/Behavioral: Negative.         Physical Exam  BP 123/80 (BP Location: Right Arm, Patient Position: Sitting, Cuff Size: Normal)   Pulse 90   Temp 98 F (36.7 C)   Resp 12   Ht 5\' 5"  (1.651 m)   Wt 100 lb (45.4 kg)   SpO2 100%   BMI 16.64 kg/m   Wt Readings from Last 5 Encounters:  04/07/23 100 lb (45.4 kg)  01/26/23 98 lb 9.6 oz (44.7 kg)  09/25/22 97 lb 6.4 oz (44.2 kg)  09/25/22 97 lb 9.6 oz (44.3 kg)  07/31/22 100 lb (45.4 kg)     Physical Exam Vitals and nursing note reviewed.  Constitutional:      General: She is not in acute distress.    Appearance: She is well-developed.  HENT:     Mouth/Throat:     Comments: Thrush noted Cardiovascular:     Rate and Rhythm: Normal rate and regular rhythm.  Pulmonary:     Effort: Pulmonary effort is normal.     Breath sounds: Normal breath sounds.  Musculoskeletal:     Left shoulder: Tenderness present. Decreased range of motion.       Arms:  Neurological:     Mental Status: She is alert and oriented to person, place, and time.      Lab Results:  CBC    Component Value Date/Time   WBC 7.3 06/26/2022 1438   WBC 8.3 05/31/2022 0603   RBC 3.94 06/26/2022 1438   RBC 3.19 (L) 05/31/2022 0603   HGB 12.3 06/26/2022 1438   HCT 37.1 06/26/2022 1438   PLT 306 06/26/2022 1438   MCV 94  06/26/2022 1438   MCH 31.2 06/26/2022 1438   MCH 31.0 05/31/2022 0603   MCHC 33.2 06/26/2022 1438   MCHC 30.5 05/31/2022 0603   RDW 14.3 06/26/2022 1438   LYMPHSABS 2.0 05/14/2022 0420   MONOABS 0.4 05/14/2022 0420   EOSABS 0.1 05/14/2022 0420   BASOSABS 0.0 05/14/2022 0420    BMET    Component Value Date/Time   NA 134 (L) 12/25/2022 1449   NA 139 06/26/2022 1438   K 3.6 12/25/2022 1449   CL 104 12/25/2022 1449   CO2  22 12/25/2022 1449   GLUCOSE 89 12/25/2022 1449   BUN 6 12/25/2022 1449   BUN 6 06/26/2022 1438   CREATININE 0.95 12/25/2022 1449   CALCIUM 9.1 12/25/2022 1449   GFRNONAA >60 12/25/2022 1449   GFRAA >60 04/28/2015 1240    BNP    Component Value Date/Time   BNP 87.5 07/03/2022 1529     Assessment & Plan:   Thrush, oral - nystatin (MYCOSTATIN) 100000 UNIT/ML suspension; Take 5 mLs (500,000 Units total) by mouth 4 (four) times daily.  Dispense: 60 mL; Refill: 0   2. Chronic left shoulder pain  - tiZANidine (ZANAFLEX) 4 MG tablet; Take 1 tablet (4 mg total) by mouth every 6 (six) hours as needed for muscle spasms.  Dispense: 30 tablet; Refill: 0    Follow up:  Follow up in 6 months     Ivonne Andrew, NP 04/08/2023

## 2023-04-07 NOTE — Patient Instructions (Addendum)
1. Thrush, oral  - nystatin (MYCOSTATIN) 100000 UNIT/ML suspension; Take 5 mLs (500,000 Units total) by mouth 4 (four) times daily.  Dispense: 60 mL; Refill: 0   2. Chronic left shoulder pain  - tiZANidine (ZANAFLEX) 4 MG tablet; Take 1 tablet (4 mg total) by mouth every 6 (six) hours as needed for muscle spasms.  Dispense: 30 tablet; Refill: 0    Follow up:  Follow up in 6 months

## 2023-04-07 NOTE — Progress Notes (Unsigned)
Pt is here for f/u   Complaining of possible thrush in her mouth, dry feeling for X1 week

## 2023-04-08 ENCOUNTER — Encounter: Payer: Self-pay | Admitting: Nurse Practitioner

## 2023-04-08 DIAGNOSIS — B370 Candidal stomatitis: Secondary | ICD-10-CM | POA: Insufficient documentation

## 2023-04-08 NOTE — Assessment & Plan Note (Signed)
-   nystatin (MYCOSTATIN) 100000 UNIT/ML suspension; Take 5 mLs (500,000 Units total) by mouth 4 (four) times daily.  Dispense: 60 mL; Refill: 0   2. Chronic left shoulder pain  - tiZANidine (ZANAFLEX) 4 MG tablet; Take 1 tablet (4 mg total) by mouth every 6 (six) hours as needed for muscle spasms.  Dispense: 30 tablet; Refill: 0    Follow up:  Follow up in 6 months

## 2023-04-27 DIAGNOSIS — Z419 Encounter for procedure for purposes other than remedying health state, unspecified: Secondary | ICD-10-CM | POA: Diagnosis not present

## 2023-04-28 ENCOUNTER — Other Ambulatory Visit: Payer: Self-pay | Admitting: Nurse Practitioner

## 2023-04-29 ENCOUNTER — Other Ambulatory Visit: Payer: Self-pay | Admitting: Nurse Practitioner

## 2023-04-29 DIAGNOSIS — G8929 Other chronic pain: Secondary | ICD-10-CM

## 2023-05-14 ENCOUNTER — Telehealth (INDEPENDENT_AMBULATORY_CARE_PROVIDER_SITE_OTHER): Payer: Medicaid Other | Admitting: Physician Assistant

## 2023-05-14 DIAGNOSIS — F411 Generalized anxiety disorder: Secondary | ICD-10-CM

## 2023-05-14 DIAGNOSIS — F331 Major depressive disorder, recurrent, moderate: Secondary | ICD-10-CM | POA: Diagnosis not present

## 2023-05-17 ENCOUNTER — Encounter (HOSPITAL_COMMUNITY): Payer: Self-pay | Admitting: Physician Assistant
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
## 2023-05-18 ENCOUNTER — Other Ambulatory Visit (HOSPITAL_COMMUNITY): Payer: Self-pay | Admitting: Physician Assistant

## 2023-05-18 DIAGNOSIS — F411 Generalized anxiety disorder: Secondary | ICD-10-CM

## 2023-05-18 DIAGNOSIS — Z7689 Persons encountering health services in other specified circumstances: Secondary | ICD-10-CM

## 2023-05-18 NOTE — Progress Notes (Signed)
Provider contacted patient to go over [facility] Testing results.  Patient underwent GeneS[facility] testing to determine what anxiolytic medications are agreeable with patient based on her genetics.  The following medications were considered as "used as directed" by the [facility]ight Testing: Alprazolam (Xanax), buspirone, chlordiazepoxide, clonazepam, clorazepate, diazepam, eszopiclone, lemborexant, lorazepam, oxazepam, suvorexant, temazepam, and zolpidem.  The following medications were considered "no proven genetic markers" by GeneSight Testing: Gabapentin  The following medications can be considered for the management of anxiety and were considered as "used as directed" by [employer] Testing: Viibryd (lurasidone) and desvenlafaxine (Pristiq)  Provider went over results with the patient.  Patient is currently on buspirone 20 mg 2 times daily and mirtazapine 30 mg at bedtime for the management of her anxiety.  Patient reports that she has tried Valium in the past with effectiveness in managing her anxiety.  Provider informed patient that she would not be placed on Valium.  Patient agreed to be placed on alprazolam 0.25 mg 2 times daily as needed for the management of her generalized anxiety.  Provider informed patient that she would only be on the medication for roughly 3 months before tapering off.  Provider also informed patient that she must complete a urine drug screen prior to being placed on the medication.  Patient vocalized understanding.  Patient's medication to be e-prescribed to pharmacy of choice once her urine drug screen results come in.

## 2023-05-21 ENCOUNTER — Other Ambulatory Visit: Payer: Self-pay | Admitting: Nurse Practitioner

## 2023-05-21 DIAGNOSIS — G8929 Other chronic pain: Secondary | ICD-10-CM

## 2023-05-28 ENCOUNTER — Other Ambulatory Visit: Payer: Self-pay | Admitting: Nurse Practitioner

## 2023-05-28 DIAGNOSIS — H9313 Tinnitus, bilateral: Secondary | ICD-10-CM

## 2023-05-28 DIAGNOSIS — Z419 Encounter for procedure for purposes other than remedying health state, unspecified: Secondary | ICD-10-CM | POA: Diagnosis not present

## 2023-06-07 ENCOUNTER — Other Ambulatory Visit: Payer: Self-pay | Admitting: Nurse Practitioner

## 2023-06-07 ENCOUNTER — Other Ambulatory Visit (HOSPITAL_COMMUNITY): Payer: Self-pay | Admitting: Physician Assistant

## 2023-06-07 DIAGNOSIS — G8929 Other chronic pain: Secondary | ICD-10-CM

## 2023-06-07 DIAGNOSIS — F331 Major depressive disorder, recurrent, moderate: Secondary | ICD-10-CM

## 2023-06-11 ENCOUNTER — Other Ambulatory Visit: Payer: Self-pay | Admitting: Nurse Practitioner

## 2023-06-14 NOTE — Telephone Encounter (Signed)
Please advise KH 

## 2023-06-17 ENCOUNTER — Other Ambulatory Visit (HOSPITAL_COMMUNITY): Payer: Self-pay | Admitting: Physician Assistant

## 2023-06-17 DIAGNOSIS — Z7689 Persons encountering health services in other specified circumstances: Secondary | ICD-10-CM

## 2023-06-17 NOTE — Progress Notes (Signed)
During her last encounter, provider discussed with patient about the patient being placed on medication for the management of her anxiety.  Provider informed patient that if she were to be placed on a benzodiazepine, then she would need to have a urine drug screen performed.  Patient verbalized understanding.  Patient to have a urine drug screen performed at a [facility].

## 2023-06-18 ENCOUNTER — Telehealth (HOSPITAL_COMMUNITY): Payer: Medicaid Other | Admitting: Physician Assistant

## 2023-06-18 DIAGNOSIS — F411 Generalized anxiety disorder: Secondary | ICD-10-CM

## 2023-06-18 DIAGNOSIS — F331 Major depressive disorder, recurrent, moderate: Secondary | ICD-10-CM

## 2023-06-20 ENCOUNTER — Encounter (HOSPITAL_COMMUNITY): Payer: Self-pay | Admitting: Physician Assistant

## 2023-06-20 MED ORDER — BUSPIRONE HCL 10 MG PO TABS
20.0000 mg | ORAL_TABLET | Freq: Two times a day (BID) | ORAL | 1 refills | Status: DC
Start: 2023-06-20 — End: 2023-08-06

## 2023-06-20 MED ORDER — MIRTAZAPINE 30 MG PO TABS
30.0000 mg | ORAL_TABLET | Freq: Every day | ORAL | 1 refills | Status: DC
Start: 2023-06-20 — End: 2023-08-06

## 2023-06-20 NOTE — Progress Notes (Unsigned)
BH MD/PA/NP OP Progress Note  Virtual Visit via Video Note  I connected with Jamie Shields on 06/20/23 at  1:30 PM EST by a video enabled telemedicine application and verified that I am speaking with the correct person using two identifiers.  Location: Patient: Home Provider: Clinic   I discussed the limitations of evaluation and management by telemedicine and the availability of in person appointments. The patient expressed understanding and agreed to proceed.  Follow Up Instructions:   I discussed the assessment and treatment plan with the patient. The patient was provided an opportunity to ask questions and all were answered. The patient agreed with the plan and demonstrated an understanding of the instructions.   The patient was advised to call back or seek an in-person evaluation if the symptoms worsen or if the condition fails to improve as anticipated.  I provided 14 minutes of non-face-to-face time during this encounter.  Meta Hatchet, PA    06/20/2023 6:02 PM SHULANDA SCHLOEMER  MRN:  [id number]  Chief Complaint:  No chief complaint on file.  HPI:   Jamie Shields is a 42 year old, Caucasian female with a past psychiatric history significant for major depressive disorder with anxious distress, and generalized anxiety disorder who presents to [facility] via virtual video visit for follow-up and medication management.  Patient is currently being managed on the following psychiatric medications:  Mirtazapine 30 mg at bedtime BuSpar 20 mg 2 times daily Hydroxyzine 10 mg 3 times daily as needed  Patient reports that she has had a somewhat difficult week this past week.  She reports that she forgot to get her drug screen since the last encounter.  She reports that she will be working first shift all week this coming week and states that she should be able to get her drug screen performed.  Patient continues to endorse difficulty  sleeping.  She reports that her mood swings are still present but have not been as bad as before.  She does report that she tends to stay angry longer.  Patient denies depressive symptoms at this time.  She reports that her anxiety has been higher as of late.  Patient rates her anxiety an 8 or 9 out of 10.  She reports that her anxiety was so elevated that she walked into a grocery store and immediately had to turn back around and go home.  A GAD-7 screen was performed with the patient scoring a 20.  Patient is alert and oriented x 4, calm, cooperative, and fully engaged in conversation during the encounter.  Patient endorses decent mood.  Patient denies suicidal or homicidal ideations.  He further denied auditory or visual hallucinations and does not appear to be responding to internal/external stimuli.  Patient endorses fair sleep and receives on average 5 hours of sleep per night.  Patient endorses improved appetite and eats on average 6 times a day including snacks.  Patient denies alcohol consumption.  Patient denies tobacco use but does engage in vaping.  Patient endorses illicit drug use in the form of marijuana.  Visit Diagnosis:    ICD-10-CM   1. Major depressive disorder, recurrent episode, moderate with anxious distress (HCC)  F33.1 mirtazapine (REMERON) 30 MG tablet    2. Generalized anxiety disorder  F41.1 busPIRone (BUSPAR) 10 MG tablet       Past Psychiatric History:  Patient reports that she has been diagnosed with the following psychiatric illnesses: bipolar disorder, panic attack, anxiety, and depression.  Patient is currently diagnosed with major depressive disorder and generalized anxiety disorder   Patient denies a past history of hospitalization due to mental health   Patient denies past history of suicide attempts  Patient denies a past history of homicide  Past Medical History:  Past Medical History:  Diagnosis Date   Anxiety    Bipolar 1 disorder (HCC)    Bursitis  of hip    Chronic bronchitis    Hip joint pain    Seizures (HCC)    Shock (HCC)    hypovolemic    Past Surgical History:  Procedure Laterality Date   BIOPSY  05/07/2022   Procedure: BIOPSY;  Surgeon: Benancio Deeds, MD;  Location: [facility] ENDOSCOPY;  Service: Gastroenterology;;   Thressa Sheller STUDY  06/03/2022   Procedure: BUBBLE STUDY;  Surgeon: Laurey Morale, MD;  Location: [facility] ENDOSCOPY;  Service: Cardiovascular;;   CESAREAN SECTION     2002   ESOPHAGOGASTRODUODENOSCOPY (EGD) WITH PROPOFOL N/A 05/07/2022   Procedure: ESOPHAGOGASTRODUODENOSCOPY (EGD) WITH PROPOFOL;  Surgeon: Benancio Deeds, MD;  Location: [facility] ENDOSCOPY;  Service: Gastroenterology;  Laterality: N/A;   KNEE ARTHROSCOPY     MOUTH SURGERY  2010   RIGHT/LEFT HEART CATH AND CORONARY ANGIOGRAPHY N/A 06/02/2022   Procedure: RIGHT/LEFT HEART CATH AND CORONARY ANGIOGRAPHY;  Surgeon: Laurey Morale, MD;  Location: [facility] INVASIVE CV LAB;  Service: Cardiovascular;  Laterality: N/A;   TEE WITHOUT CARDIOVERSION N/A 06/03/2022   Procedure: TRANSESOPHAGEAL ECHOCARDIOGRAM (TEE);  Surgeon: Laurey Morale, MD;  Location: [facility] ENDOSCOPY;  Service: Cardiovascular;  Laterality: N/A;    Family Psychiatric History:  Father - depression Mother - depression Grandmother (paternal) - depression Brother - panic attacks  Family History:  Family History  Problem Relation Age of Onset   Heart attack Mother    Hypertension Mother    Heart attack Father    Heart attack Maternal Grandfather    Heart attack Paternal Grandfather    Diabetes Other    Cancer Other     Social History:  Social History   Socioeconomic History   Marital status: Legally Separated    Spouse name: Not on file   Number of children: Not on file   Years of education: Not on file   Highest education level: Associate degree: occupational, Scientist, product/process development, or vocational program  Occupational History   Not on file  Tobacco Use   Smoking status: Former    Current  packs/day: 0.50    Types: Cigarettes, E-cigarettes    Start date: 2018   Smokeless tobacco: Never   Tobacco comments:    Former smoker 07/03/22  Vaping Use   Vaping status: Some Days  Substance and Sexual Activity   Alcohol use: Not Currently    Comment: rarely   Drug use: Not Currently   Sexual activity: Yes    Birth control/protection: None  Other Topics Concern   Not on file  Social History Narrative   Not on file   Social Determinants of Health   Financial Resource Strain: High Risk (01/12/2023)   Overall Financial Resource Strain (CARDIA)    Difficulty of Paying Living Expenses: Hard  Food Insecurity: Food Insecurity Present (01/12/2023)   Hunger Vital Sign    Worried About Running Out of Food in the Last Year: Sometimes true    Ran Out of Food in the Last Year: Sometimes true  Transportation Needs: No Transportation Needs (01/12/2023)   PRAPARE - Administrator, Civil Service (Medical): No  Lack of Transportation (Non-Medical): No  Physical Activity: Unknown (01/12/2023)   Exercise Vital Sign    Days of Exercise per Week: 5 days    Minutes of Exercise per Session: Patient declined  Stress: Stress Concern Present (01/12/2023)   [employer] of Occupational Health - Occupational Stress Questionnaire    Feeling of Stress : Very much  Social Connections: Unknown (01/12/2023)   Social Connection and Isolation Panel [NHANES]    Frequency of Communication with Friends and Family: Once a week    Frequency of Social Gatherings with Friends and Family: Patient declined    Attends Religious Services: 1 to 4 times per year    Active Member of [employer] or Organizations: No    Attends Engineer, structural: Not on file    Marital Status: Separated    Allergies:  Allergies  Allergen Reactions   Cucumber Extract Anaphylaxis   Keflex [Cephalexin] Nausea And Vomiting   Macrobid [Nitrofurantoin Monohydrate Macrocrystals] Hives and Swelling   Naproxen Nausea  And Vomiting   Penicillins Nausea And Vomiting    Heavy vomitting Did PCN reaction causing immediate rash, facial/tongue/throat swelling, SOB or lightheadedness with hypotension: No swelling, but im not sure if i got a rash as i was red all over already Did PCN reaction causing severe rash involving mucus membranes or skin necrosis: No Did a PCN reaction that required hospitalization Yes went to the ED Has patient had a PCN reaction occurring within the last 10 years: No-childhood allergy If all of the above answers are "NO", then may proceed with   Tramadol Nausea And Vomiting   Watermelon Concentrate Diarrhea   Darvocet [Propoxyphene N-Acetaminophen] Nausea And Vomiting and Other (See Comments)    migraines   Flagyl [Metronidazole Hcl] Hives, Swelling and Rash   Ketorolac Nausea And Vomiting and Other (See Comments)    migraines   Ultram [Tramadol Hcl] Nausea And Vomiting and Other (See Comments)    migraines   Vicodin [Hydrocodone-Acetaminophen] Nausea And Vomiting and Other (See Comments)    migraines    Metabolic Disorder Labs: No results found for: "HGBA1C", "MPG" No results found for: "PROLACTIN" No results found for: "CHOL", "TRIG", "HDL", "CHOLHDL", "VLDL", "LDLCALC" Lab Results  Component Value Date   TSH 1.258 05/01/2022    Therapeutic Level Labs: No results found for: "LITHIUM" No results found for: "VALPROATE" No results found for: "CBMZ"  Current Medications: Current Outpatient Medications  Medication Sig Dispense Refill   busPIRone (BUSPAR) 10 MG tablet Take 2 tablets (20 mg total) by mouth 2 (two) times daily. 120 tablet 1   carvedilol (COREG) 6.25 MG tablet Take 1 tablet (6.25 mg total) by mouth 2 (two) times daily. 180 tablet 3   dapagliflozin propanediol (FARXIGA) 10 MG TABS tablet TAKE 1 TABLET (10 MG TOTAL) BY MOUTH DAILY. 30 tablet 3   fluticasone (FLONASE) 50 MCG/ACT nasal spray PLACE 2 SPRAYS INTO BOTH NOSTRILS DAILY. 16 g 6   furosemide (LASIX) 20 MG  tablet TAKE 1 TABLET (20 MG) BY MOUTH DAILY 30 tablet 1   hydrOXYzine (ATARAX) 10 MG tablet Take 1 tablet (10 mg total) by mouth 3 (three) times daily as needed. 75 tablet 1   levETIRAcetam (KEPPRA) 500 MG tablet TAKE 1 TABLET BY MOUTH TWICE A DAY 60 tablet 0   mirtazapine (REMERON) 30 MG tablet Take 1 tablet (30 mg total) by mouth at bedtime. 30 tablet 1   nystatin (MYCOSTATIN) 100000 UNIT/ML suspension Take 5 mLs (500,000 Units total) by mouth 4 (  four) times daily. 60 mL 0   pantoprazole (PROTONIX) 40 MG tablet Take 1 tablet (40 mg total) by mouth daily. 30 tablet 2   sacubitril-valsartan (ENTRESTO) 24-26 MG Take 1 tablet by mouth 2 (two) times daily. 60 tablet 6   spironolactone (ALDACTONE) 25 MG tablet Take 1 tablet (25 mg total) by mouth daily. 30 tablet 6   thiamine (VITAMIN B1) 100 MG tablet Take 1 tablet (100 mg total) by mouth daily. 30 tablet 1   tiZANidine (ZANAFLEX) 4 MG tablet TAKE 1 TABLET BY MOUTH EVERY 6 HOURS AS NEEDED FOR MUSCLE SPASMS. 30 tablet 0   No current facility-administered medications for this visit.     Musculoskeletal: Strength & Muscle Tone: within normal limits Gait & Station: normal Patient leans: N/A  Psychiatric Specialty Exam: Review of Systems  Psychiatric/Behavioral:  Positive for sleep disturbance. Negative for decreased concentration, dysphoric mood, hallucinations, self-injury and suicidal ideas. The patient is nervous/anxious. The patient is not hyperactive.     There were no vitals taken for this visit.There is no height or weight on file to calculate BMI.  General Appearance: Casual  Eye Contact:  Good  Speech:  Clear and Coherent and Normal Rate  Volume:  Normal  Mood:  Anxious and Irritable  Affect:  Congruent  Thought Process:  Coherent and Descriptions of Associations: Intact  Orientation:  Full (Time, Place, and Person)  Thought Content: WDL   Suicidal Thoughts:  No  Homicidal Thoughts:  No  Memory:  Immediate;   Good Recent;    Good Remote;   Good  Judgement:  Good  Insight:  Good  Psychomotor Activity:  Normal  Concentration:  Concentration: Good and Attention Span: Good  Recall:  Good  Fund of Knowledge: Good  Language: Good  Akathisia:  No  Handed:  Ambidextrous  AIMS (if indicated): not done  Assets:  Communication Skills Desire for Improvement Housing Physical Health Social Support Transportation Vocational/Educational  ADL's:  Intact  Cognition: WNL  Sleep:  Fair   Screenings: AUDIT    Flowsheet Row Appointment from 01/13/2023 in [facility]  Alcohol Use Disorder Identification Test Final Score (AUDIT) 5      GAD-7    Flowsheet Row Video Visit from 06/18/2023 in [facility] Video Visit from 05/14/2023 in [facility] Clinical Support from 04/02/2023 in [facility] Video Visit from 02/11/2023 in [facility] Video Visit from 12/31/2022 in [facility]  Total GAD-7 Score 20 19 20 19 19       PHQ2-9    Flowsheet Row Video Visit from 06/18/2023 in [facility] Video Visit from 05/14/2023 in [facility] Clinical Support from 04/02/2023 in [facility] Video Visit from 02/11/2023 in [facility] Office Visit from 01/26/2023 in [facility]  PHQ-2 Total Score 0 0 1 2 0  PHQ-9 Total Score -- -- -- 13 6      Flowsheet Row Video Visit from 06/18/2023 in [facility] Video Visit from 05/14/2023 in [facility] Clinical Support from 04/02/2023 in [facility]  C-SSRS RISK CATEGORY Moderate Risk Moderate Risk Low Risk        Assessment and Plan:   Jamie L.  Shields is a 42 year old, Caucasian female with a past psychiatric history significant for major depressive disorder with anxious distress, and generalized anxiety disorder who presents to [facility] via virtual video visit for follow-up and medication management.  Patient presents to the encounter stating that she forgot to get her drug screen performed since the last encounter.  She reports that she will be working first shift this coming week and should be able to get her urine drug screen prior to her next appointment.  Patient denies depressive symptoms but states that she has been experiencing elevated anxiety that prevents her from doing activities of daily living such as going to the grocery store.  Patient to continue taking her medications as prescribed.  Patient's medications to be e-prescribed to pharmacy of choice.  Collaboration of Care: Collaboration of Care: Medication Management AEB provider managing patient's psychiatric medications, Psychiatrist AEB patient being seen by mental health provider at this facility, and Referral or follow-up with counselor/therapist AEB patient being seen by a licensed clinical social worker at this facility  Patient/Guardian was advised Release of Information must be obtained prior to any record release in order to collaborate their care with an outside provider. Patient/Guardian was advised if they have not already done so to contact the registration department to sign all necessary forms in order for [place] to release information regarding their care.   Consent: Patient/Guardian gives verbal consent for treatment and assignment of benefits for services provided during this visit. Patient/Guardian expressed understanding and agreed to proceed.   1. Major depressive disorder, recurrent episode, moderate with anxious distress (HCC)  - mirtazapine (REMERON) 30 MG tablet; Take 1 tablet (30 mg total) by mouth at bedtime.   Dispense: 30 tablet; Refill: 1  2. Generalized anxiety disorder  - busPIRone (BUSPAR) 10 MG tablet; Take 2 tablets (20 mg total) by mouth 2 (two) times daily.  Dispense: 120 tablet; Refill: 1  Patient to follow-up in 6 weeks Provider spent a total of 14 minutes with the patient/reviewing patient's chart  Meta Hatchet, PA 06/20/2023, 6:02 PM

## 2023-06-27 DIAGNOSIS — Z419 Encounter for procedure for purposes other than remedying health state, unspecified: Secondary | ICD-10-CM | POA: Diagnosis not present

## 2023-07-06 ENCOUNTER — Encounter: Payer: Medicaid Other | Admitting: Obstetrics and Gynecology

## 2023-07-10 ENCOUNTER — Other Ambulatory Visit: Payer: Self-pay | Admitting: Nurse Practitioner

## 2023-07-10 DIAGNOSIS — G8929 Other chronic pain: Secondary | ICD-10-CM

## 2023-07-22 ENCOUNTER — Other Ambulatory Visit (HOSPITAL_COMMUNITY): Payer: Self-pay

## 2023-07-28 DIAGNOSIS — Z419 Encounter for procedure for purposes other than remedying health state, unspecified: Secondary | ICD-10-CM | POA: Diagnosis not present

## 2023-08-04 ENCOUNTER — Other Ambulatory Visit (HOSPITAL_COMMUNITY): Payer: Self-pay

## 2023-08-06 ENCOUNTER — Encounter (HOSPITAL_COMMUNITY): Payer: Self-pay | Admitting: Physician Assistant

## 2023-08-06 ENCOUNTER — Telehealth (INDEPENDENT_AMBULATORY_CARE_PROVIDER_SITE_OTHER): Payer: Medicaid Other | Admitting: Physician Assistant

## 2023-08-06 ENCOUNTER — Other Ambulatory Visit: Payer: Self-pay | Admitting: Nurse Practitioner

## 2023-08-06 DIAGNOSIS — Z0283 Encounter for blood-alcohol and blood-drug test: Secondary | ICD-10-CM

## 2023-08-06 DIAGNOSIS — F331 Major depressive disorder, recurrent, moderate: Secondary | ICD-10-CM

## 2023-08-06 DIAGNOSIS — F411 Generalized anxiety disorder: Secondary | ICD-10-CM

## 2023-08-06 MED ORDER — MIRTAZAPINE 45 MG PO TABS
45.0000 mg | ORAL_TABLET | Freq: Every day | ORAL | 1 refills | Status: DC
Start: 2023-08-06 — End: 2023-09-24

## 2023-08-06 MED ORDER — BUSPIRONE HCL 10 MG PO TABS
20.0000 mg | ORAL_TABLET | Freq: Two times a day (BID) | ORAL | 1 refills | Status: DC
Start: 2023-08-06 — End: 2023-09-24

## 2023-08-06 NOTE — Progress Notes (Signed)
 [facility] MD/PA/NP OP Progress Note  Virtual Visit via Video Note  I connected with Jamie Shields on 08/06/23 at  1:30 PM EST by a video enabled telemedicine application and verified that I am speaking with the correct person using two identifiers.  Location: Patient: Home Provider: Clinic   I discussed the limitations of evaluation and management by telemedicine and the availability of in person appointments. The patient expressed understanding and agreed to proceed.  Follow Up Instructions:   I discussed the assessment and treatment plan with the patient. The patient was provided an opportunity to ask questions and all were answered. The patient agreed with the plan and demonstrated an understanding of the instructions.   The patient was advised to call back or seek an in-person evaluation if the symptoms worsen or if the condition fails to improve as anticipated.  I provided 17 minutes of non-face-to-face time during this encounter.  Reginia FORBES Bolster, PA    08/06/2023 1:47 PM MADALENE MICKLER  MRN:  [id number]  Chief Complaint:  Chief Complaint  Patient presents with   Follow-up   Medication Management   HPI:   Jamie Shields is a 43 year old, Caucasian female with a past psychiatric history significant for major depressive disorder with anxious distress, and generalized anxiety disorder who presents to [facility] via virtual video visit for follow-up and medication management.  Patient is currently being managed on the following psychiatric medications:  Mirtazapine  30 mg at bedtime BuSpar  20 mg 2 times daily  Patient presents to the encounter stating that she is not doing too good.  She reports that she feels sickly and has not been sleeping regularly.  Patient also reports that her anxiety is through the roof.  Patient rates her anxiety at 9 out of 10.  Patient anxiety attributed to family related stressors.  Patient reports that she still  has not completed her urine drug screen.  Despite her anxiety, patient denies overt depressive symptoms.  Patient continues to take her mirtazapine  and buspirone  regularly.  She reports that her mirtazapine  was helpful with managing her sleep when she first started it but states that she is now receiving roughly 4 to 5 hours of sleep at a time.  Patient states that when she increased her doses of mirtazapine , she was able to receive more sleep.  A GAD-7 screen was performed with the patient scoring a 20.  Patient is alert and oriented x 4, calm, cooperative, and fully engaged in conversation during the encounter.  Patient endorses decent mood.  Patient exhibits anxious mood with congruent affect.  Patient denies suicidal or homicidal ideations.  She further denies auditory or visual hallucinations and does not appear to be responding to internal/external stimuli.  Patient endorses fair sleep and receives on average 4 to 5 hours of sleep per night.  Patient endorses fair appetite and eats on average 2 meals per day.  Patient endorses alcohol consumption sparingly.  Patient denies tobacco use but does engage in vaping.  Patient endorses illicit drug use in the form of marijuana.  Visit Diagnosis:    ICD-10-CM   1. Encounter for drug screening  Z02.83 Urine Drug Panel 7    2. Major depressive disorder, recurrent episode, moderate with anxious distress (HCC)  F33.1 mirtazapine  (REMERON ) 45 MG tablet    3. Generalized anxiety disorder  F41.1 busPIRone  (BUSPAR ) 10 MG tablet        Past Psychiatric History:  Patient reports that she has  been diagnosed with the following psychiatric illnesses: bipolar disorder, panic attack, anxiety, and depression. Patient is currently diagnosed with major depressive disorder and generalized anxiety disorder   Patient denies a past history of hospitalization due to mental health   Patient denies past history of suicide attempts  Patient denies a past history of  homicide  Past Medical History:  Past Medical History:  Diagnosis Date   Anxiety    Bipolar 1 disorder (HCC)    Bursitis of hip    Chronic bronchitis    Hip joint pain    Seizures (HCC)    Shock (HCC)    hypovolemic    Past Surgical History:  Procedure Laterality Date   BIOPSY  05/07/2022   Procedure: BIOPSY;  Surgeon: Leigh Elspeth SQUIBB, MD;  Location: [facility] ENDOSCOPY;  Service: Gastroenterology;;   VASSIE STUDY  06/03/2022   Procedure: BUBBLE STUDY;  Surgeon: Rolan Ezra RAMAN, MD;  Location: [facility] ENDOSCOPY;  Service: Cardiovascular;;   CESAREAN SECTION     2002   ESOPHAGOGASTRODUODENOSCOPY (EGD) WITH PROPOFOL  N/A 05/07/2022   Procedure: ESOPHAGOGASTRODUODENOSCOPY (EGD) WITH PROPOFOL ;  Surgeon: Leigh Elspeth SQUIBB, MD;  Location: [facility] ENDOSCOPY;  Service: Gastroenterology;  Laterality: N/A;   KNEE ARTHROSCOPY     MOUTH SURGERY  2010   RIGHT/LEFT HEART CATH AND CORONARY ANGIOGRAPHY N/A 06/02/2022   Procedure: RIGHT/LEFT HEART CATH AND CORONARY ANGIOGRAPHY;  Surgeon: Rolan Ezra RAMAN, MD;  Location: [facility] INVASIVE CV LAB;  Service: Cardiovascular;  Laterality: N/A;   TEE WITHOUT CARDIOVERSION N/A 06/03/2022   Procedure: TRANSESOPHAGEAL ECHOCARDIOGRAM (TEE);  Surgeon: Rolan Ezra RAMAN, MD;  Location: [facility] ENDOSCOPY;  Service: Cardiovascular;  Laterality: N/A;    Family Psychiatric History:  Father - depression Mother - depression Grandmother (paternal) - depression Brother - panic attacks  Family History:  Family History  Problem Relation Age of Onset   Heart attack Mother    Hypertension Mother    Heart attack Father    Heart attack Maternal Grandfather    Heart attack Paternal Grandfather    Diabetes Other    Cancer Other     Social History:  Social History   Socioeconomic History   Marital status: Legally Separated    Spouse name: Not on file   Number of children: Not on file   Years of education: Not on file   Highest education level: Associate degree: occupational,  scientist, product/process development, or vocational program  Occupational History   Not on file  Tobacco Use   Smoking status: Former    Current packs/day: 0.50    Types: Cigarettes, E-cigarettes    Start date: 2018   Smokeless tobacco: Never   Tobacco comments:    Former smoker 07/03/22  Vaping Use   Vaping status: Some Days  Substance and Sexual Activity   Alcohol use: Not Currently    Comment: rarely   Drug use: Not Currently   Sexual activity: Yes    Birth control/protection: None  Other Topics Concern   Not on file  Social History Narrative   Not on file   Social Drivers of Health   Financial Resource Strain: High Risk (01/12/2023)   Overall Financial Resource Strain (CARDIA)    Difficulty of Paying Living Expenses: Hard  Food Insecurity: Food Insecurity Present (01/12/2023)   Hunger Vital Sign    Worried About Running Out of Food in the Last Year: Sometimes true    Ran Out of Food in the Last Year: Sometimes true  Transportation Needs: No Transportation Needs (01/12/2023)  PRAPARE - Administrator, Civil Service (Medical): No    Lack of Transportation (Non-Medical): No  Physical Activity: Unknown (01/12/2023)   Exercise Vital Sign    Days of Exercise per Week: 5 days    Minutes of Exercise per Session: Patient declined  Stress: Stress Concern Present (01/12/2023)   [employer] of Occupational Health - Occupational Stress Questionnaire    Feeling of Stress : Very much  Social Connections: Unknown (01/12/2023)   Social Connection and Isolation Panel [NHANES]    Frequency of Communication with Friends and Family: Once a week    Frequency of Social Gatherings with Friends and Family: Patient declined    Attends Religious Services: 1 to 4 times per year    Active Member of [employer] or Organizations: No    Attends Engineer, Structural: Not on file    Marital Status: Separated    Allergies:  Allergies  Allergen Reactions   Cucumber Extract Anaphylaxis   Keflex   [Cephalexin ] Nausea And Vomiting   Macrobid  [Nitrofurantoin  Monohydrate Macrocrystals] Hives and Swelling   Naproxen  Nausea And Vomiting   Penicillins Nausea And Vomiting    Heavy vomitting Did PCN reaction causing immediate rash, facial/tongue/throat swelling, SOB or lightheadedness with hypotension: No swelling, but im not sure if i got a rash as i was red all over already Did PCN reaction causing severe rash involving mucus membranes or skin necrosis: No Did a PCN reaction that required hospitalization Yes went to the ED Has patient had a PCN reaction occurring within the last 10 years: No-childhood allergy If all of the above answers are NO, then may proceed with   Tramadol  Nausea And Vomiting   Watermelon Concentrate Diarrhea   Darvocet [Propoxyphene N-Acetaminophen ] Nausea And Vomiting and Other (See Comments)    migraines   Flagyl  [Metronidazole  Hcl] Hives, Swelling and Rash   Ketorolac  Nausea And Vomiting and Other (See Comments)    migraines   Ultram  [Tramadol  Hcl] Nausea And Vomiting and Other (See Comments)    migraines   Vicodin [Hydrocodone -Acetaminophen ] Nausea And Vomiting and Other (See Comments)    migraines    Metabolic Disorder Labs: No results found for: HGBA1C, MPG No results found for: PROLACTIN No results found for: CHOL, TRIG, HDL, CHOLHDL, VLDL, LDLCALC Lab Results  Component Value Date   TSH 1.258 05/01/2022    Therapeutic Level Labs: No results found for: LITHIUM No results found for: VALPROATE No results found for: CBMZ  Current Medications: Current Outpatient Medications  Medication Sig Dispense Refill   busPIRone  (BUSPAR ) 10 MG tablet Take 2 tablets (20 mg total) by mouth 2 (two) times daily. 120 tablet 1   carvedilol  (COREG ) 6.25 MG tablet Take 1 tablet (6.25 mg total) by mouth 2 (two) times daily. 180 tablet 3   dapagliflozin  propanediol (FARXIGA ) 10 MG TABS tablet TAKE 1 TABLET (10 MG TOTAL) BY MOUTH DAILY. 30 tablet  3   fluticasone  (FLONASE ) 50 MCG/ACT nasal spray PLACE 2 SPRAYS INTO BOTH NOSTRILS DAILY. 16 g 6   furosemide  (LASIX ) 20 MG tablet TAKE 1 TABLET (20 MG) BY MOUTH DAILY 30 tablet 1   hydrOXYzine  (ATARAX ) 10 MG tablet Take 1 tablet (10 mg total) by mouth 3 (three) times daily as needed. 75 tablet 1   levETIRAcetam  (KEPPRA ) 500 MG tablet TAKE 1 TABLET BY MOUTH TWICE A DAY 60 tablet 0   mirtazapine  (REMERON ) 45 MG tablet Take 1 tablet (45 mg total) by mouth at bedtime. 30 tablet 1  nystatin  (MYCOSTATIN ) 100000 UNIT/ML suspension Take 5 mLs (500,000 Units total) by mouth 4 (four) times daily. 60 mL 0   pantoprazole  (PROTONIX ) 40 MG tablet Take 1 tablet (40 mg total) by mouth daily. 30 tablet 2   sacubitril -valsartan  (ENTRESTO ) 24-26 MG Take 1 tablet by mouth 2 (two) times daily. 60 tablet 6   spironolactone  (ALDACTONE ) 25 MG tablet Take 1 tablet (25 mg total) by mouth daily. 30 tablet 6   thiamine  (VITAMIN B1) 100 MG tablet Take 1 tablet (100 mg total) by mouth daily. 30 tablet 1   tiZANidine  (ZANAFLEX ) 4 MG tablet TAKE 1 TABLET BY MOUTH EVERY 6 HOURS AS NEEDED FOR MUSCLE SPASMS. 30 tablet 0   No current facility-administered medications for this visit.     Musculoskeletal: Strength & Muscle Tone: within normal limits Gait & Station: normal Patient leans: N/A  Psychiatric Specialty Exam: Review of Systems  Psychiatric/Behavioral:  Positive for sleep disturbance. Negative for decreased concentration, dysphoric mood, hallucinations, self-injury and suicidal ideas. The patient is nervous/anxious. The patient is not hyperactive.     There were no vitals taken for this visit.There is no height or weight on file to calculate BMI.  General Appearance: Casual  Eye Contact:  Good  Speech:  Clear and Coherent and Normal Rate  Volume:  Normal  Mood:  Anxious and Irritable  Affect:  Congruent  Thought Process:  Coherent and Descriptions of Associations: Intact  Orientation:  Full (Time, Place, and  Person)  Thought Content: WDL   Suicidal Thoughts:  No  Homicidal Thoughts:  No  Memory:  Immediate;   Good Recent;   Good Remote;   Good  Judgement:  Good  Insight:  Good  Psychomotor Activity:  Normal  Concentration:  Concentration: Good and Attention Span: Good  Recall:  Good  Fund of Knowledge: Good  Language: Good  Akathisia:  No  Handed:  Ambidextrous  AIMS (if indicated): not done  Assets:  Communication Skills Desire for Improvement Housing Physical Health Social Support Transportation Vocational/Educational  ADL's:  Intact  Cognition: WNL  Sleep:  Fair   Screenings: AUDIT    Flowsheet Row Appointment from 01/13/2023 in [facility]  Alcohol Use Disorder Identification Test Final Score (AUDIT) 5      GAD-7    Flowsheet Row Video Visit from 08/06/2023 in [facility] Video Visit from 06/18/2023 in [facility] Video Visit from 05/14/2023 in [facility] Clinical Support from 04/02/2023 in [facility] Video Visit from 02/11/2023 in [facility]  Total GAD-7 Score 20 20 19 20 19       PHQ2-9    Flowsheet Row Video Visit from 08/06/2023 in [facility] Video Visit from 06/18/2023 in [facility] Video Visit from 05/14/2023 in [facility] Clinical Support from 04/02/2023 in [facility] Video Visit from 02/11/2023 in [facility]  PHQ-2 Total Score 0 0 0 1 2  PHQ-9 Total Score -- -- -- -- 13      Flowsheet Row Video Visit from 08/06/2023 in [facility] Video Visit from 06/18/2023 in [facility] Video Visit from 05/14/2023 in [facility]  C-SSRS RISK  CATEGORY Moderate Risk Moderate Risk Moderate Risk        Assessment  and Plan:   Jamie Shields is a 43 year old, Caucasian female with a past psychiatric history significant for major depressive disorder with anxious distress, and generalized anxiety disorder who presents to [facility] via virtual video visit for follow-up and medication management.  Patient presents today encounter stating that she has been taking her medications regularly.  She reports that she still continues to experiencing worsening anxiety attributed to family stressors.  Patient also endorses issues with sleep.  She reports that when she was taking an increased dosage of her mirtazapine , she experienced more sleep.  Provider recommended increasing her mirtazapine  dosage from 30 mg to 45 mg at bedtime for the management of her anxiety and for sleep.  Patient reports that she has been on hydroxyzine  for the management of her anxiety and is currently on buspirone .  Provider to possibly consider antidepressants for the management of patient's anxiety.  Patient endorses having a primary care provider.  Collaboration of Care: Collaboration of Care: Medication Management AEB provider managing patient's psychiatric medications, Psychiatrist AEB patient being seen by mental health provider at this facility, and Referral or follow-up with counselor/therapist AEB patient being seen by a licensed clinical social worker at this facility  Patient/Guardian was advised Release of Information must be obtained prior to any record release in order to collaborate their care with an outside provider. Patient/Guardian was advised if they have not already done so to contact the registration department to sign all necessary forms in order for us  to release information regarding their care.   Consent: Patient/Guardian gives verbal consent for treatment and assignment of benefits for services provided during  this visit. Patient/Guardian expressed understanding and agreed to proceed.   1. Major depressive disorder, recurrent episode, moderate with anxious distress (HCC)  - mirtazapine  (REMERON ) 45 MG tablet; Take 1 tablet (45 mg total) by mouth at bedtime.  Dispense: 30 tablet; Refill: 1  2. Generalized anxiety disorder  - busPIRone  (BUSPAR ) 10 MG tablet; Take 2 tablets (20 mg total) by mouth 2 (two) times daily.  Dispense: 120 tablet; Refill: 1  3. Encounter for drug screening (Primary)  - Urine Drug Panel 7; Future  Patient to follow-up in 6 weeks Provider spent a total of 17 minutes with the patient/reviewing patient's chart  Reginia FORBES Bolster, PA 08/06/2023, 5:55 PM

## 2023-08-07 ENCOUNTER — Other Ambulatory Visit: Payer: Self-pay | Admitting: Nurse Practitioner

## 2023-08-07 DIAGNOSIS — G8929 Other chronic pain: Secondary | ICD-10-CM

## 2023-08-16 ENCOUNTER — Other Ambulatory Visit (HOSPITAL_COMMUNITY): Payer: Self-pay

## 2023-08-23 ENCOUNTER — Other Ambulatory Visit (HOSPITAL_COMMUNITY): Payer: Self-pay | Admitting: Family Medicine

## 2023-08-28 DIAGNOSIS — Z419 Encounter for procedure for purposes other than remedying health state, unspecified: Secondary | ICD-10-CM | POA: Diagnosis not present

## 2023-09-08 ENCOUNTER — Other Ambulatory Visit: Payer: Self-pay | Admitting: Nurse Practitioner

## 2023-09-08 DIAGNOSIS — G8929 Other chronic pain: Secondary | ICD-10-CM

## 2023-09-09 ENCOUNTER — Other Ambulatory Visit (HOSPITAL_COMMUNITY): Payer: Self-pay

## 2023-09-09 NOTE — Telephone Encounter (Signed)
Please advise [facility]

## 2023-09-23 DIAGNOSIS — Z7689 Persons encountering health services in other specified circumstances: Secondary | ICD-10-CM | POA: Diagnosis not present

## 2023-09-24 ENCOUNTER — Telehealth (HOSPITAL_COMMUNITY): Payer: Medicaid Other | Admitting: Physician Assistant

## 2023-09-24 ENCOUNTER — Encounter (HOSPITAL_COMMUNITY): Payer: Self-pay | Admitting: Physician Assistant

## 2023-09-24 DIAGNOSIS — F411 Generalized anxiety disorder: Secondary | ICD-10-CM | POA: Diagnosis not present

## 2023-09-24 DIAGNOSIS — Z0283 Encounter for blood-alcohol and blood-drug test: Secondary | ICD-10-CM

## 2023-09-24 DIAGNOSIS — F331 Major depressive disorder, recurrent, moderate: Secondary | ICD-10-CM | POA: Diagnosis not present

## 2023-09-24 MED ORDER — BUSPIRONE HCL 10 MG PO TABS
20.0000 mg | ORAL_TABLET | Freq: Two times a day (BID) | ORAL | 1 refills | Status: DC
Start: 2023-09-24 — End: 2023-11-05

## 2023-09-24 MED ORDER — MIRTAZAPINE 45 MG PO TABS
45.0000 mg | ORAL_TABLET | Freq: Every day | ORAL | 1 refills | Status: DC
Start: 2023-09-24 — End: 2023-11-05

## 2023-09-24 NOTE — Progress Notes (Unsigned)
 BH MD/PA/NP OP Progress Note  Virtual Visit via Video Note  I connected with Jamie Shields on 09/24/23 at  2:30 PM EST by a video enabled telemedicine application and verified that I am speaking with the correct person using two identifiers.  Location: Patient: Home Provider: Clinic   I discussed the limitations of evaluation and management by telemedicine and the availability of in person appointments. The patient expressed understanding and agreed to proceed.  Follow Up Instructions:   I discussed the assessment and treatment plan with the patient. The patient was provided an opportunity to ask questions and all were answered. The patient agreed with the plan and demonstrated an understanding of the instructions.   The patient was advised to call back or seek an in-person evaluation if the symptoms worsen or if the condition fails to improve as anticipated.  I provided 14 minutes of non-face-to-face time during this encounter.  Meta Hatchet, PA    09/24/2023 2:30 PM Jamie Shields  MRN:  [id number]  Chief Complaint:  Chief Complaint  Patient presents with   Follow-up   Medication Refill   HPI:   Jamie Shields is a 43 year old, Caucasian female with a past psychiatric history significant for major depressive disorder with anxious distress, and generalized anxiety disorder who presents to [facility] via virtual video visit for follow-up and medication management.  Patient is currently being managed on the following psychiatric medications:  Mirtazapine 45 mg at bedtime BuSpar 20 mg 2 times daily  Patient presents to the encounter stating that she was able to provide a urine drug screen at a [facility] facility; however, provider was unable to find the results.  Patient continues to express that her anxiety is extremely high.  Patient rates her anxiety as 7 out of 10.  Patient reports that she is still taking mirtazapine and buspirone  regularly and denies experiencing any adverse side effects.  Patient denies experiencing overt depressive symptoms.  A GAD-7 screen was performed with the patient scoring a 15.  Patient is alert and oriented x 4, calm, cooperative, and fully engaged in conversation during the encounter.  Patient reports that she feels like crap.  Patient exhibits anxious mood with appropriate affect.  Patient denies suicidal or homicidal ideations.  She further denies auditory or visual hallucinations and does not appear to be responding to internal soft external stimuli.  Patient reports that she normally gets roughly 6 hours of sleep per night but lately, she has been sleeping any chance that she gets.  Patient endorses fair appetite.  Patient denies alcohol consumption.  Patient denies tobacco use but does engage in vaping.  Patient endorses illicit drug use in the form of marijuana.  Visit Diagnosis:    ICD-10-CM   1. Encounter for drug screening  Z02.83 Urine Drug Panel 7    2. Generalized anxiety disorder  F41.1 busPIRone (BUSPAR) 10 MG tablet    3. Major depressive disorder, recurrent episode, moderate with anxious distress (HCC)  F33.1 mirtazapine (REMERON) 45 MG tablet        Past Psychiatric History:  Patient reports that she has been diagnosed with the following psychiatric illnesses: bipolar disorder, panic attack, anxiety, and depression. Patient is currently diagnosed with major depressive disorder and generalized anxiety disorder   Patient denies a past history of hospitalization due to mental health   Patient denies past history of suicide attempts  Patient denies a past history of homicide  Past Medical History:  Past Medical History:  Diagnosis Date   Anxiety    Bipolar 1 disorder (HCC)    Bursitis of hip    Chronic bronchitis    Hip joint pain    Seizures (HCC)    Shock (HCC)    hypovolemic    Past Surgical History:  Procedure Laterality Date   BIOPSY  05/07/2022    Procedure: BIOPSY;  Surgeon: Benancio Deeds, MD;  Location: [facility] ENDOSCOPY;  Service: Gastroenterology;;   BUBBLE STUDY  06/03/2022   Procedure: BUBBLE STUDY;  Surgeon: Laurey Morale, MD;  Location: [facility] ENDOSCOPY;  Service: Cardiovascular;;   CESAREAN SECTION     2002   ESOPHAGOGASTRODUODENOSCOPY (EGD) WITH PROPOFOL N/A 05/07/2022   Procedure: ESOPHAGOGASTRODUODENOSCOPY (EGD) WITH PROPOFOL;  Surgeon: Benancio Deeds, MD;  Location: [facility] ENDOSCOPY;  Service: Gastroenterology;  Laterality: N/A;   KNEE ARTHROSCOPY     MOUTH SURGERY  2010   RIGHT/LEFT HEART CATH AND CORONARY ANGIOGRAPHY N/A 06/02/2022   Procedure: RIGHT/LEFT HEART CATH AND CORONARY ANGIOGRAPHY;  Surgeon: Laurey Morale, MD;  Location: [facility] INVASIVE CV LAB;  Service: Cardiovascular;  Laterality: N/A;   TEE WITHOUT CARDIOVERSION N/A 06/03/2022   Procedure: TRANSESOPHAGEAL ECHOCARDIOGRAM (TEE);  Surgeon: Laurey Morale, MD;  Location: [facility] ENDOSCOPY;  Service: Cardiovascular;  Laterality: N/A;    Family Psychiatric History:  Father - depression Mother - depression Grandmother (paternal) - depression Brother - panic attacks  Family History:  Family History  Problem Relation Age of Onset   Heart attack Mother    Hypertension Mother    Heart attack Father    Heart attack Maternal Grandfather    Heart attack Paternal Grandfather    Diabetes Other    Cancer Other     Social History:  Social History   Socioeconomic History   Marital status: Legally Separated    Spouse name: Not on file   Number of children: Not on file   Years of education: Not on file   Highest education level: Associate degree: occupational, Scientist, product/process development, or vocational program  Occupational History   Not on file  Tobacco Use   Smoking status: Former    Current packs/day: 0.50    Types: Cigarettes, E-cigarettes    Start date: 2018   Smokeless tobacco: Never   Tobacco comments:    Former smoker 07/03/22  Vaping Use   Vaping status: Some  Days  Substance and Sexual Activity   Alcohol use: Not Currently    Comment: rarely   Drug use: Not Currently   Sexual activity: Yes    Birth control/protection: None  Other Topics Concern   Not on file  Social History Narrative   Not on file   Social Drivers of Health   Financial Resource Strain: High Risk (01/12/2023)   Overall Financial Resource Strain (CARDIA)    Difficulty of Paying Living Expenses: Hard  Food Insecurity: Food Insecurity Present (01/12/2023)   Hunger Vital Sign    Worried About Running Out of Food in the Last Year: Sometimes true    Ran Out of Food in the Last Year: Sometimes true  Transportation Needs: No Transportation Needs (01/12/2023)   PRAPARE - Administrator, Civil Service (Medical): No    Lack of Transportation (Non-Medical): No  Physical Activity: Unknown (01/12/2023)   Exercise Vital Sign    Days of Exercise per Week: 5 days    Minutes of Exercise per Session: Patient declined  Stress: Stress Concern Present (01/12/2023)   [employer] of Occupational  Health - Occupational Stress Questionnaire    Feeling of Stress : Very much  Social Connections: Unknown (01/12/2023)   Social Connection and Isolation Panel [NHANES]    Frequency of Communication with Friends and Family: Once a week    Frequency of Social Gatherings with Friends and Family: Patient declined    Attends Religious Services: 1 to 4 times per year    Active Member of [employer] or Organizations: No    Attends Engineer, structural: Not on file    Marital Status: Separated    Allergies:  Allergies  Allergen Reactions   Cucumber Extract Anaphylaxis   Keflex [Cephalexin] Nausea And Vomiting   Macrobid [Nitrofurantoin Monohydrate Macrocrystals] Hives and Swelling   Naproxen Nausea And Vomiting   Penicillins Nausea And Vomiting    Heavy vomitting Did PCN reaction causing immediate rash, facial/tongue/throat swelling, SOB or lightheadedness with hypotension: No  swelling, but im not sure if i got a rash as i was red all over already Did PCN reaction causing severe rash involving mucus membranes or skin necrosis: No Did a PCN reaction that required hospitalization Yes went to the ED Has patient had a PCN reaction occurring within the last 10 years: No-childhood allergy If all of the above answers are "NO", then may proceed with   Tramadol Nausea And Vomiting   Watermelon Concentrate Diarrhea   Darvocet [Propoxyphene N-Acetaminophen] Nausea And Vomiting and Other (See Comments)    migraines   Flagyl [Metronidazole Hcl] Hives, Swelling and Rash   Ketorolac Nausea And Vomiting and Other (See Comments)    migraines   Ultram [Tramadol Hcl] Nausea And Vomiting and Other (See Comments)    migraines   Vicodin [Hydrocodone-Acetaminophen] Nausea And Vomiting and Other (See Comments)    migraines    Metabolic Disorder Labs: No results found for: "HGBA1C", "MPG" No results found for: "PROLACTIN" No results found for: "CHOL", "TRIG", "HDL", "CHOLHDL", "VLDL", "LDLCALC" Lab Results  Component Value Date   TSH 1.258 05/01/2022    Therapeutic Level Labs: No results found for: "LITHIUM" No results found for: "VALPROATE" No results found for: "CBMZ"  Current Medications: Current Outpatient Medications  Medication Sig Dispense Refill   busPIRone (BUSPAR) 10 MG tablet Take 2 tablets (20 mg total) by mouth 2 (two) times daily. 120 tablet 1   carvedilol (COREG) 6.25 MG tablet Take 1 tablet (6.25 mg total) by mouth 2 (two) times daily. NEEDS FOLLOW UP APPOINTMENT FOR MORE REFILLS 180 tablet 0   dapagliflozin propanediol (FARXIGA) 10 MG TABS tablet TAKE 1 TABLET (10 MG TOTAL) BY MOUTH DAILY. 30 tablet 3   fluticasone (FLONASE) 50 MCG/ACT nasal spray PLACE 2 SPRAYS INTO BOTH NOSTRILS DAILY. 16 g 6   furosemide (LASIX) 20 MG tablet TAKE 1 TABLET (20 MG) BY MOUTH DAILY 30 tablet 1   hydrOXYzine (ATARAX) 10 MG tablet Take 1 tablet (10 mg total) by mouth 3 (three)  times daily as needed. 75 tablet 1   levETIRAcetam (KEPPRA) 500 MG tablet TAKE 1 TABLET BY MOUTH TWICE A DAY 60 tablet 1   mirtazapine (REMERON) 45 MG tablet Take 1 tablet (45 mg total) by mouth at bedtime. 30 tablet 1   nystatin (MYCOSTATIN) 100000 UNIT/ML suspension Take 5 mLs (500,000 Units total) by mouth 4 (four) times daily. 60 mL 0   pantoprazole (PROTONIX) 40 MG tablet Take 1 tablet (40 mg total) by mouth daily. 30 tablet 2   sacubitril-valsartan (ENTRESTO) 24-26 MG Take 1 tablet by mouth 2 (two) times daily.  60 tablet 6   spironolactone (ALDACTONE) 25 MG tablet Take 1 tablet (25 mg total) by mouth daily. 30 tablet 6   thiamine (VITAMIN B1) 100 MG tablet Take 1 tablet (100 mg total) by mouth daily. 30 tablet 1   tiZANidine (ZANAFLEX) 4 MG tablet TAKE 1 TABLET BY MOUTH EVERY 6 HOURS AS NEEDED FOR MUSCLE SPASMS. 30 tablet 1   No current facility-administered medications for this visit.     Musculoskeletal: Strength & Muscle Tone: within normal limits Gait & Station: normal Patient leans: N/A  Psychiatric Specialty Exam: Review of Systems  Psychiatric/Behavioral:  Positive for sleep disturbance. Negative for decreased concentration, dysphoric mood, hallucinations, self-injury and suicidal ideas. The patient is nervous/anxious. The patient is not hyperactive.     There were no vitals taken for this visit.There is no height or weight on file to calculate BMI.  General Appearance: Casual  Eye Contact:  Good  Speech:  Clear and Coherent and Normal Rate  Volume:  Normal  Mood:  Anxious and Irritable  Affect:  Congruent  Thought Process:  Coherent and Descriptions of Associations: Intact  Orientation:  Full (Time, Place, and Person)  Thought Content: WDL   Suicidal Thoughts:  No  Homicidal Thoughts:  No  Memory:  Immediate;   Good Recent;   Good Remote;   Good  Judgement:  Good  Insight:  Good  Psychomotor Activity:  Normal  Concentration:  Concentration: Good and Attention  Span: Good  Recall:  Good  Fund of Knowledge: Good  Language: Good  Akathisia:  No  Handed:  Ambidextrous  AIMS (if indicated): not done  Assets:  Communication Skills Desire for Improvement Housing Physical Health Social Support Transportation Vocational/Educational  ADL's:  Intact  Cognition: WNL  Sleep:  Fair   Screenings: AUDIT    Flowsheet Row Appointment from 01/13/2023 in [facility]  Alcohol Use Disorder Identification Test Final Score (AUDIT) 5      GAD-7    Flowsheet Row Video Visit from 09/24/2023 in [facility] Video Visit from 08/06/2023 in [facility] Video Visit from 06/18/2023 in [facility] Video Visit from 05/14/2023 in [facility] Clinical Support from 04/02/2023 in [facility]  Total GAD-7 Score 15 20 20 19 20       PHQ2-9    Flowsheet Row Video Visit from 09/24/2023 in [facility] Video Visit from 08/06/2023 in [facility] Video Visit from 06/18/2023 in [facility] Video Visit from 05/14/2023 in [facility] Clinical Support from 04/02/2023 in [facility]  PHQ-2 Total Score 0 0 0 0 1      Flowsheet Row Video Visit from 09/24/2023 in [facility] Video Visit from 08/06/2023 in [facility] Video Visit from 06/18/2023 in [facility]  C-SSRS RISK CATEGORY Moderate Risk Moderate Risk Moderate Risk        Assessment and Plan:   Jamie Shields is a 43 year old, Caucasian female with a past psychiatric history significant for major depressive disorder with anxious distress, and generalized anxiety disorder who presents to [facility] via virtual video visit for follow-up and medication management.  Patient presents to the encounter continuing to endorse elevated anxiety.  She reports that she  has provided a urine sample for urine drug screen; however, provider was unable to locate the results.  Patient is continuing to take her medications regularly and denies experiencing any adverse side effects.  Provider to reach out to [facility] facility to determine if patient was able to provide a sample.  Patient's medications to be e-prescribed through pharmacy of choice.  Collaboration of Care: Collaboration of Care: Medication Management AEB provider managing patient's psychiatric medications, Psychiatrist AEB patient being seen by mental health provider at this facility, and Referral or follow-up with counselor/therapist AEB patient being seen by a licensed clinical social worker at this facility  Patient/Guardian was advised Release of Information must be obtained prior to any record release in order to collaborate their care with an outside provider. Patient/Guardian was advised if they have not already done so to contact the registration department to sign all necessary forms in order for [place] to release information regarding their care.   Consent: Patient/Guardian gives verbal consent for treatment and assignment of benefits for services provided during this visit. Patient/Guardian expressed understanding and agreed to proceed.   1. Encounter for drug screening  - Urine Drug Panel 7  2. Generalized anxiety disorder  - busPIRone (BUSPAR) 10 MG tablet; Take 2 tablets (20 mg total) by mouth 2 (two) times daily.  Dispense: 120 tablet; Refill: 1  3. Major depressive disorder, recurrent episode, moderate with anxious distress (HCC)  - mirtazapine (REMERON) 45 MG tablet; Take 1 tablet (45 mg total) by mouth at bedtime.  Dispense: 30 tablet; Refill: 1  Patient to follow-up in 6 weeks Provider spent a  total of 14 minutes with the patient/reviewing patient's chart  Meta Hatchet, PA 09/24/2023, 2:30 PM

## 2023-09-25 DIAGNOSIS — Z419 Encounter for procedure for purposes other than remedying health state, unspecified: Secondary | ICD-10-CM | POA: Diagnosis not present

## 2023-09-28 LAB — URINE DRUG PANEL 7
Amphetamines, Urine: POSITIVE — AB
Barbiturate Quant, Ur: NEGATIVE ng/mL
Benzodiazepine Quant, Ur: POSITIVE — AB
Cannabinoid Quant, Ur: POSITIVE — AB
Cocaine (Metab.): NEGATIVE ng/mL
Opiate Quant, Ur: NEGATIVE ng/mL
PCP Quant, Ur: NEGATIVE ng/mL

## 2023-09-29 ENCOUNTER — Other Ambulatory Visit: Payer: Self-pay | Admitting: Nurse Practitioner

## 2023-09-29 MED ORDER — ONDANSETRON 4 MG PO TBDP: 4.0000 mg | ORAL_TABLET | Freq: Three times a day (TID) | ORAL | 0 refills | Status: DC | PRN

## 2023-10-06 ENCOUNTER — Ambulatory Visit (INDEPENDENT_AMBULATORY_CARE_PROVIDER_SITE_OTHER): Payer: Self-pay | Admitting: Nurse Practitioner

## 2023-10-06 VITALS — BP 131/88 | HR 101 | Temp 98.0°F | Wt 92.7 lb

## 2023-10-06 DIAGNOSIS — E559 Vitamin D deficiency, unspecified: Secondary | ICD-10-CM | POA: Diagnosis not present

## 2023-10-06 DIAGNOSIS — Z1329 Encounter for screening for other suspected endocrine disorder: Secondary | ICD-10-CM | POA: Diagnosis not present

## 2023-10-06 DIAGNOSIS — Z13220 Encounter for screening for lipoid disorders: Secondary | ICD-10-CM | POA: Diagnosis not present

## 2023-10-06 DIAGNOSIS — B370 Candidal stomatitis: Secondary | ICD-10-CM | POA: Diagnosis not present

## 2023-10-06 DIAGNOSIS — G40909 Epilepsy, unspecified, not intractable, without status epilepticus: Secondary | ICD-10-CM

## 2023-10-06 MED ORDER — ONDANSETRON 4 MG PO TBDP: 4.0000 mg | ORAL_TABLET | Freq: Three times a day (TID) | ORAL | 0 refills | Status: DC | PRN

## 2023-10-06 MED ORDER — LEVETIRACETAM 500 MG PO TABS: 500.0000 mg | ORAL_TABLET | Freq: Two times a day (BID) | ORAL | 1 refills | Status: DC

## 2023-10-06 MED ORDER — NYSTATIN 100000 UNIT/ML MT SUSP
5.0000 mL | Freq: Four times a day (QID) | OROMUCOSAL | 0 refills | Status: DC
Start: 2023-10-06 — End: 2023-12-10

## 2023-10-06 NOTE — Patient Instructions (Signed)
 1. Thrush, oral  - nystatin (MYCOSTATIN) 100000 UNIT/ML suspension; Take 5 mLs (500,000 Units total) by mouth 4 (four) times daily.  Dispense: 60 mL; Refill: 0  2. Seizure disorder (HCC) (Primary)  - CBC - Comprehensive metabolic panel  3. Vitamin D deficiency  - Vitamin D, 25-hydroxy  4. Lipid screening  - Lipid Panel  5. Thyroid disorder screen  - Thyroid Panel With TSH

## 2023-10-06 NOTE — Progress Notes (Signed)
 Subjective   Patient ID: Jamie Shields, female    DOB: 1980/11/27, 43 y.o.   MRN: [id number]  Chief Complaint  Patient presents with   Medical Management of Chronic Issues    Patient stated that she just got over the flu    Referring provider: Ivonne Andrew, NP  Jamie Shields is a 43 y.o. female with Past Medical History: No date: Anxiety No date: Bipolar 1 disorder (HCC) No date: Bursitis of hip No date: Chronic bronchitis No date: Hip joint pain No date: Seizures (HCC) No date: Shock [facility])     Comment:  hypovolemic   HPI  Patient presents today for follow-up visit.  She is complaining today of possible thrush to her mouth.  She states that she has been having to her tongue and mouth for the past week.  She did recently get new dentures.  We will trial nystatin suspension.  Does need refills and labs today. Denies f/c/s, n/v/d, hemoptysis, PND, leg swelling Denies chest pain or edema     Allergies  Allergen Reactions   Cucumber Extract Anaphylaxis   Keflex [Cephalexin] Nausea And Vomiting   Macrobid [Nitrofurantoin Monohydrate Macrocrystals] Hives and Swelling   Naproxen Nausea And Vomiting   Penicillins Nausea And Vomiting    Heavy vomitting Did PCN reaction causing immediate rash, facial/tongue/throat swelling, SOB or lightheadedness with hypotension: No swelling, but im not sure if i got a rash as i was red all over already Did PCN reaction causing severe rash involving mucus membranes or skin necrosis: No Did a PCN reaction that required hospitalization Yes went to the ED Has patient had a PCN reaction occurring within the last 10 years: No-childhood allergy If all of the above answers are "NO", then may proceed with   Tramadol Nausea And Vomiting   Watermelon Concentrate Diarrhea   Darvocet [Propoxyphene N-Acetaminophen] Nausea And Vomiting and Other (See Comments)    migraines   Flagyl [Metronidazole Hcl] Hives, Swelling and Rash   Ketorolac Nausea And  Vomiting and Other (See Comments)    migraines   Ultram [Tramadol Hcl] Nausea And Vomiting and Other (See Comments)    migraines   Vicodin [Hydrocodone-Acetaminophen] Nausea And Vomiting and Other (See Comments)    migraines     There is no immunization history on file for this patient.  Tobacco History: Social History   Tobacco Use  Smoking Status Former   Current packs/day: 0.50   Types: Cigarettes, E-cigarettes   Start date: 2018  Smokeless Tobacco Never  Tobacco Comments   Former smoker 07/03/22   Counseling given: Not Answered Tobacco comments: Former smoker 07/03/22   Outpatient Encounter Medications as of 10/06/2023  Medication Sig   busPIRone (BUSPAR) 10 MG tablet Take 2 tablets (20 mg total) by mouth 2 (two) times daily.   carvedilol (COREG) 6.25 MG tablet Take 1 tablet (6.25 mg total) by mouth 2 (two) times daily. NEEDS FOLLOW UP APPOINTMENT FOR MORE REFILLS   furosemide (LASIX) 20 MG tablet TAKE 1 TABLET (20 MG) BY MOUTH DAILY   mirtazapine (REMERON) 45 MG tablet Take 1 tablet (45 mg total) by mouth at bedtime.   pantoprazole (PROTONIX) 40 MG tablet Take 1 tablet (40 mg total) by mouth daily.   [DISCONTINUED] levETIRAcetam (KEPPRA) 500 MG tablet TAKE 1 TABLET BY MOUTH TWICE A DAY   [DISCONTINUED] ondansetron (ZOFRAN-ODT) 4 MG disintegrating tablet Take 1 tablet (4 mg total) by mouth every 8 (eight) hours as needed.   dapagliflozin propanediol (FARXIGA)  10 MG TABS tablet TAKE 1 TABLET (10 MG TOTAL) BY MOUTH DAILY. (Patient not taking: Reported on 10/06/2023)   fluticasone (FLONASE) 50 MCG/ACT nasal spray PLACE 2 SPRAYS INTO BOTH NOSTRILS DAILY. (Patient not taking: Reported on 10/06/2023)   levETIRAcetam (KEPPRA) 500 MG tablet Take 1 tablet (500 mg total) by mouth 2 (two) times daily.   nystatin (MYCOSTATIN) 100000 UNIT/ML suspension Take 5 mLs (500,000 Units total) by mouth 4 (four) times daily.   ondansetron (ZOFRAN-ODT) 4 MG disintegrating tablet Take 1 tablet (4 mg  total) by mouth every 8 (eight) hours as needed.   sacubitril-valsartan (ENTRESTO) 24-26 MG Take 1 tablet by mouth 2 (two) times daily. (Patient not taking: Reported on 10/06/2023)   spironolactone (ALDACTONE) 25 MG tablet Take 1 tablet (25 mg total) by mouth daily. (Patient not taking: Reported on 10/06/2023)   thiamine (VITAMIN B1) 100 MG tablet Take 1 tablet (100 mg total) by mouth daily. (Patient not taking: Reported on 10/06/2023)   tiZANidine (ZANAFLEX) 4 MG tablet TAKE 1 TABLET BY MOUTH EVERY 6 HOURS AS NEEDED FOR MUSCLE SPASMS. (Patient not taking: Reported on 10/06/2023)   [DISCONTINUED] hydrOXYzine (ATARAX) 10 MG tablet Take 1 tablet (10 mg total) by mouth 3 (three) times daily as needed.   [DISCONTINUED] nystatin (MYCOSTATIN) 100000 UNIT/ML suspension Take 5 mLs (500,000 Units total) by mouth 4 (four) times daily. (Patient not taking: Reported on 10/06/2023)   No facility-administered encounter medications on file as of 10/06/2023.    Review of Systems  Review of Systems  Constitutional: Negative.   HENT: Negative.    Cardiovascular: Negative.   Gastrointestinal: Negative.   Allergic/Immunologic: Negative.   Neurological: Negative.   Psychiatric/Behavioral: Negative.       Objective:   BP 131/88   Pulse (!) 101   Temp 98 F (36.7 C) (Oral)   Wt 92 lb 11.2 oz (42 kg)   SpO2 94%   BMI 15.43 kg/m   Wt Readings from Last 5 Encounters:  10/06/23 92 lb 11.2 oz (42 kg)  04/07/23 100 lb (45.4 kg)  01/26/23 98 lb 9.6 oz (44.7 kg)  09/25/22 97 lb 6.4 oz (44.2 kg)  09/25/22 97 lb 9.6 oz (44.3 kg)     Physical Exam Vitals and nursing note reviewed.  Constitutional:      General: She is not in acute distress.    Appearance: She is well-developed.  Cardiovascular:     Rate and Rhythm: Normal rate and regular rhythm.  Pulmonary:     Effort: Pulmonary effort is normal.     Breath sounds: Normal breath sounds.  Neurological:     Mental Status: She is alert and oriented to  person, place, and time.       Assessment & Plan:   Seizure disorder (HCC) -     CBC -     Comprehensive metabolic panel  Thrush, oral -     Nystatin; Take 5 mLs (500,000 Units total) by mouth 4 (four) times daily.  Dispense: 60 mL; Refill: 0  Vitamin D deficiency -     VITAMIN D 25 Hydroxy (Vit-D Deficiency, Fractures)  Lipid screening -     Lipid panel  Thyroid disorder screen -     Thyroid Panel With TSH  Other orders -     Ondansetron; Take 1 tablet (4 mg total) by mouth every 8 (eight) hours as needed.  Dispense: 20 tablet; Refill: 0 -     levETIRAcetam; Take 1 tablet (500 mg total) by mouth  2 (two) times daily.  Dispense: 60 tablet; Refill: 1     Return in about 6 months (around 04/07/2024).   Ivonne Andrew, NP 10/06/2023

## 2023-10-07 LAB — CBC
Hematocrit: 37.9 % (ref 34.0–46.6)
Hemoglobin: 12.5 g/dL (ref 11.1–15.9)
MCH: 30.3 pg (ref 26.6–33.0)
MCHC: 33 g/dL (ref 31.5–35.7)
MCV: 92 fL (ref 79–97)
Platelets: 621 10*3/uL — ABNORMAL HIGH (ref 150–450)
RBC: 4.12 x10E6/uL (ref 3.77–5.28)
RDW: 13.5 % (ref 11.7–15.4)
WBC: 16.1 10*3/uL — ABNORMAL HIGH (ref 3.4–10.8)

## 2023-10-07 LAB — THYROID PANEL WITH TSH
Free Thyroxine Index: 1.8 (ref 1.2–4.9)
T3 Uptake Ratio: 25 % (ref 24–39)
T4, Total: 7.3 ug/dL (ref 4.5–12.0)
TSH: 1.42 u[IU]/mL (ref 0.450–4.500)

## 2023-10-07 LAB — LIPID PANEL
Chol/HDL Ratio: 2.7 ratio (ref 0.0–4.4)
Cholesterol, Total: 131 mg/dL (ref 100–199)
HDL: 48 mg/dL (ref >39)
LDL Chol Calc (NIH): 58 mg/dL (ref 0–99)
Triglycerides: 143 mg/dL (ref 0–149)
VLDL Cholesterol Cal: 25 mg/dL (ref 5–40)

## 2023-10-07 LAB — COMPREHENSIVE METABOLIC PANEL
ALT: 17 IU/L (ref 0–32)
AST: 24 IU/L (ref 0–40)
Albumin: 4.4 g/dL (ref 3.9–4.9)
Alkaline Phosphatase: 162 IU/L — ABNORMAL HIGH (ref 44–121)
BUN/Creatinine Ratio: 8 — ABNORMAL LOW (ref 9–23)
BUN: 8 mg/dL (ref 6–24)
Bilirubin Total: 0.3 mg/dL (ref 0.0–1.2)
CO2: 24 mmol/L (ref 20–29)
Calcium: 9.8 mg/dL (ref 8.7–10.2)
Chloride: 99 mmol/L (ref 96–106)
Creatinine, Ser: 1.02 mg/dL — ABNORMAL HIGH (ref 0.57–1.00)
Globulin, Total: 2.8 g/dL (ref 1.5–4.5)
Glucose: 68 mg/dL — ABNORMAL LOW (ref 70–99)
Potassium: 3.8 mmol/L (ref 3.5–5.2)
Sodium: 139 mmol/L (ref 134–144)
Total Protein: 7.2 g/dL (ref 6.0–8.5)
eGFR: 70 mL/min/{1.73_m2} (ref >59)

## 2023-10-07 LAB — VITAMIN D 25 HYDROXY (VIT D DEFICIENCY, FRACTURES): Vit D, 25-Hydroxy: 51.4 ng/mL (ref 30.0–100.0)

## 2023-11-05 ENCOUNTER — Encounter (HOSPITAL_COMMUNITY): Payer: Self-pay | Admitting: Physician Assistant

## 2023-11-05 ENCOUNTER — Other Ambulatory Visit: Payer: Self-pay | Admitting: Nurse Practitioner

## 2023-11-05 ENCOUNTER — Telehealth (HOSPITAL_COMMUNITY): Admitting: Physician Assistant

## 2023-11-05 DIAGNOSIS — G8929 Other chronic pain: Secondary | ICD-10-CM

## 2023-11-05 DIAGNOSIS — F411 Generalized anxiety disorder: Secondary | ICD-10-CM

## 2023-11-05 MED ORDER — BUSPIRONE HCL 10 MG PO TABS: 20.0000 mg | ORAL_TABLET | Freq: Two times a day (BID) | ORAL | 1 refills | Status: DC

## 2023-11-05 MED ORDER — MIRTAZAPINE 45 MG PO TABS: 45.0000 mg | ORAL_TABLET | Freq: Every day | ORAL | 1 refills | Status: DC

## 2023-11-05 NOTE — Progress Notes (Signed)
 [facility] MD/PA/NP OP Progress Note  Virtual Visit via Video Note  I connected with Jamie Shields on 11/05/23 at  2:30 PM EDT by a video enabled telemedicine application and verified that I am speaking with the correct person using two identifiers.  Location: Patient: Home Provider: Clinic   I discussed the limitations of evaluation and management by telemedicine and the availability of in person appointments. The patient expressed understanding and agreed to proceed.  Follow Up Instructions:   I discussed the assessment and treatment plan with the patient. The patient was provided an opportunity to ask questions and all were answered. The patient agreed with the plan and demonstrated an understanding of the instructions.   The patient was advised to call back or seek an in-person evaluation if the symptoms worsen or if the condition fails to improve as anticipated.  I provided 16 minutes of non-face-to-face time during this encounter.  Meta Hatchet, PA    11/05/2023 3:00 PM RANDAL YEPIZ  MRN:  [id number]  Chief Complaint:  Chief Complaint  Patient presents with   Follow-up   Medication Refill   HPI:   Jamie Shields is a 43 year old, Caucasian female with a past psychiatric history significant for major depressive disorder with anxious distress, and generalized anxiety disorder who presents to [facility] via virtual video visit for follow-up and medication management.  Patient is currently being managed on the following psychiatric medications:  Mirtazapine 45 mg at bedtime BuSpar 20 mg 2 times daily  During the initial segment of the encounter, provider went over patient urine drug screen results.  Provider informed patient that the presence of amphetamines and benzodiazepine were found in her system.  Patient informed provider that she reported that those findings would be seen in her urine.  Provider does not recall patient saying  that she uses benzodiazepine and an amphetamine without prescription.  When asked what prescriptions that she has been using, patient reports that she has been using Valium and Adderall.  Provider informed patient that due to using medications that are not prescribed to her, that it would be extremely difficult for her to be placed on a controlled substance.  Patient informed provider that she knows what she has been through and knows what works for her.  Despite using Valium and Adderall under the table, patient reports that she still continues to use her mirtazapine and buspirone.  She reports no issues or concerns regarding her use of those medications.  Patient denies overt depressive symptoms.  She still continues to endorse anxiety and rates her anxiety as 7 out of 10.  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  A GAD-7 screen was performed with the patient scoring a 10.  Patient is alert and oriented x 4, calm, cooperative, and fully engaged in conversation during the encounter.  Patient endorses fairly good mood but states that she is tired.  Patient denies suicidal or homicidal ideations.  She further denies auditory or visual hallucinations and does not appear to be responding to normal/external stimuli.  Patient endorses good sleep (on average 6 to 7 hours of sleep per night.  Patient endorses good appetite and eats on average 2 meals along with a snack per day.  Patient denies alcohol consumption.  Patient denies tobacco use but does engage in sleeping.  Patient endorses illicit drug use in the form of  marijuana.  Visit Diagnosis:    ICD-10-CM   1. Major depressive disorder, recurrent episode, moderate with anxious distress (HCC)  F33.1 mirtazapine (REMERON) 45 MG tablet    2. Generalized anxiety disorder  F41.1 busPIRone (BUSPAR) 10 MG tablet       Past Psychiatric History:  Patient  reports that she has been diagnosed with the following psychiatric illnesses: bipolar disorder, panic attack, anxiety, and depression. Patient is currently diagnosed with major depressive disorder and generalized anxiety disorder   Patient denies a past history of hospitalization due to mental health   Patient denies past history of suicide attempts  Patient denies a past history of homicide  Past Medical History:  Past Medical History:  Diagnosis Date   Anxiety    Bipolar 1 disorder (HCC)    Bursitis of hip    Chronic bronchitis    Hip joint pain    Seizures (HCC)    Shock (HCC)    hypovolemic    Past Surgical History:  Procedure Laterality Date   BIOPSY  05/07/2022   Procedure: BIOPSY;  Surgeon: Ace Holder, MD;  Location: [facility] ENDOSCOPY;  Service: Gastroenterology;;   [place] Im STUDY  06/03/2022   Procedure: BUBBLE STUDY;  Surgeon: Darlis Eisenmenger, MD;  Location: [facility] ENDOSCOPY;  Service: Cardiovascular;;   CESAREAN SECTION     2002   ESOPHAGOGASTRODUODENOSCOPY (EGD) WITH PROPOFOL N/A 05/07/2022   Procedure: ESOPHAGOGASTRODUODENOSCOPY (EGD) WITH PROPOFOL;  Surgeon: Ace Holder, MD;  Location: [facility] ENDOSCOPY;  Service: Gastroenterology;  Laterality: N/A;   KNEE ARTHROSCOPY     MOUTH SURGERY  2010   RIGHT/LEFT HEART CATH AND CORONARY ANGIOGRAPHY N/A 06/02/2022   Procedure: RIGHT/LEFT HEART CATH AND CORONARY ANGIOGRAPHY;  Surgeon: Darlis Eisenmenger, MD;  Location: [facility] INVASIVE CV LAB;  Service: Cardiovascular;  Laterality: N/A;   TEE WITHOUT CARDIOVERSION N/A 06/03/2022   Procedure: TRANSESOPHAGEAL ECHOCARDIOGRAM (TEE);  Surgeon: Darlis Eisenmenger, MD;  Location: [facility] ENDOSCOPY;  Service: Cardiovascular;  Laterality: N/A;    Family Psychiatric History:  Father - depression Mother - depression Grandmother (paternal) - depression Brother - panic attacks  Family History:  Family History  Problem Relation Age of Onset   Heart attack Mother    Hypertension Mother     Heart attack Father    Heart attack Maternal Grandfather    Heart attack Paternal Grandfather    Diabetes Other    Cancer Other     Social History:  Social History   Socioeconomic History   Marital status: Legally Separated    Spouse name: Not on file   Number of children: Not on file   Years of education: Not on file   Highest education level: Associate degree: academic program  Occupational History   Not on file  Tobacco Use   Smoking status: Former    Current packs/day: 0.50    Types: Cigarettes, E-cigarettes    Start date: 2018   Smokeless tobacco: Never   Tobacco comments:    Former smoker 07/03/22  Vaping Use   Vaping status: Some Days  Substance and Sexual Activity   Alcohol use: Not Currently    Comment: rarely   Drug use: Not Currently   Sexual activity: Yes    Birth control/protection: None  Other Topics Concern   Not on file  Social History Narrative   Not on file   Social Drivers of Health   Financial Resource Strain: Medium Risk (10/06/2023)   Overall Financial Resource Strain (CARDIA)    Difficulty  of Paying Living Expenses: Somewhat hard  Food Insecurity: Food Insecurity Present (10/06/2023)   Hunger Vital Sign    Worried About Running Out of Food in the Last Year: Sometimes true    Ran Out of Food in the Last Year: Patient declined  Transportation Needs: No Transportation Needs (01/12/2023)   PRAPARE - Administrator, Civil Service (Medical): No    Lack of Transportation (Non-Medical): No  Physical Activity: Unknown (10/06/2023)   Exercise Vital Sign    Days of Exercise per Week: 7 days    Minutes of Exercise per Session: Patient declined  Stress: Stress Concern Present (10/06/2023)   [employer] of Occupational Health - Occupational Stress Questionnaire    Feeling of Stress : Very much  Social Connections: Unknown (10/06/2023)   Social Connection and Isolation Panel [NHANES]    Frequency of Communication with Friends and Family:  Once a week    Frequency of Social Gatherings with Friends and Family: Patient declined    Attends Religious Services: 1 to 4 times per year    Active Member of [employer] or Organizations: No    Attends Engineer, structural: Not on file    Marital Status: Separated    Allergies:  Allergies  Allergen Reactions   Cucumber Extract Anaphylaxis   Keflex [Cephalexin] Nausea And Vomiting   Macrobid [Nitrofurantoin Monohydrate Macrocrystals] Hives and Swelling   Naproxen Nausea And Vomiting   Penicillins Nausea And Vomiting    Heavy vomitting Did PCN reaction causing immediate rash, facial/tongue/throat swelling, SOB or lightheadedness with hypotension: No swelling, but im not sure if i got a rash as i was red all over already Did PCN reaction causing severe rash involving mucus membranes or skin necrosis: No Did a PCN reaction that required hospitalization Yes went to the ED Has patient had a PCN reaction occurring within the last 10 years: No-childhood allergy If all of the above answers are "NO", then may proceed with   Tramadol Nausea And Vomiting   Watermelon Concentrate Diarrhea   Darvocet [Propoxyphene N-Acetaminophen] Nausea And Vomiting and Other (See Comments)    migraines   Flagyl [Metronidazole Hcl] Hives, Swelling and Rash   Ketorolac Nausea And Vomiting and Other (See Comments)    migraines   Ultram [Tramadol Hcl] Nausea And Vomiting and Other (See Comments)    migraines   Vicodin [Hydrocodone-Acetaminophen] Nausea And Vomiting and Other (See Comments)    migraines    Metabolic Disorder Labs: No results found for: "HGBA1C", "MPG" No results found for: "PROLACTIN" Lab Results  Component Value Date   CHOL 131 10/06/2023   TRIG 143 10/06/2023   HDL 48 10/06/2023   CHOLHDL 2.7 10/06/2023   LDLCALC 58 10/06/2023   Lab Results  Component Value Date   TSH 1.420 10/06/2023   TSH 1.258 05/01/2022    Therapeutic Level Labs: No results found for: "LITHIUM" No  results found for: "VALPROATE" No results found for: "CBMZ"  Current Medications: Current Outpatient Medications  Medication Sig Dispense Refill   busPIRone (BUSPAR) 10 MG tablet Take 2 tablets (20 mg total) by mouth 2 (two) times daily. 120 tablet 1   carvedilol (COREG) 6.25 MG tablet Take 1 tablet (6.25 mg total) by mouth 2 (two) times daily. NEEDS FOLLOW UP APPOINTMENT FOR MORE REFILLS 180 tablet 0   dapagliflozin propanediol (FARXIGA) 10 MG TABS tablet TAKE 1 TABLET (10 MG TOTAL) BY MOUTH DAILY. (Patient not taking: Reported on 10/06/2023) 30 tablet 3   fluticasone (FLONASE)  50 MCG/ACT nasal spray PLACE 2 SPRAYS INTO BOTH NOSTRILS DAILY. (Patient not taking: Reported on 10/06/2023) 16 g 6   furosemide (LASIX) 20 MG tablet TAKE 1 TABLET (20 MG) BY MOUTH DAILY 30 tablet 1   levETIRAcetam (KEPPRA) 500 MG tablet Take 1 tablet (500 mg total) by mouth 2 (two) times daily. 60 tablet 1   mirtazapine (REMERON) 45 MG tablet Take 1 tablet (45 mg total) by mouth at bedtime. 30 tablet 1   nystatin (MYCOSTATIN) 100000 UNIT/ML suspension Take 5 mLs (500,000 Units total) by mouth 4 (four) times daily. 60 mL 0   ondansetron (ZOFRAN-ODT) 4 MG disintegrating tablet Take 1 tablet (4 mg total) by mouth every 8 (eight) hours as needed. 20 tablet 0   pantoprazole (PROTONIX) 40 MG tablet Take 1 tablet (40 mg total) by mouth daily. 30 tablet 2   sacubitril-valsartan (ENTRESTO) 24-26 MG Take 1 tablet by mouth 2 (two) times daily. (Patient not taking: Reported on 10/06/2023) 60 tablet 6   spironolactone (ALDACTONE) 25 MG tablet Take 1 tablet (25 mg total) by mouth daily. (Patient not taking: Reported on 10/06/2023) 30 tablet 6   thiamine (VITAMIN B1) 100 MG tablet Take 1 tablet (100 mg total) by mouth daily. (Patient not taking: Reported on 10/06/2023) 30 tablet 1   tiZANidine (ZANAFLEX) 4 MG tablet TAKE 1 TABLET BY MOUTH EVERY 6 HOURS AS NEEDED FOR MUSCLE SPASMS. 30 tablet 2   No current facility-administered medications  for this visit.     Musculoskeletal: Strength & Muscle Tone: within normal limits Gait & Station: normal Patient leans: N/A  Psychiatric Specialty Exam: Review of Systems  Psychiatric/Behavioral:  Positive for sleep disturbance. Negative for decreased concentration, dysphoric mood, hallucinations, self-injury and suicidal ideas. The patient is nervous/anxious. The patient is not hyperactive.     There were no vitals taken for this visit.There is no height or weight on file to calculate BMI.  General Appearance: Casual  Eye Contact:  Good  Speech:  Clear and Coherent and Normal Rate  Volume:  Normal  Mood:  Anxious and Irritable  Affect:  Appropriate  Thought Process:  Coherent and Descriptions of Associations: Intact  Orientation:  Full (Time, Place, and Person)  Thought Content: WDL   Suicidal Thoughts:  No  Homicidal Thoughts:  No  Memory:  Immediate;   Good Recent;   Good Remote;   Good  Judgement:  Good  Insight:  Good  Psychomotor Activity:  Normal  Concentration:  Concentration: Good and Attention Span: Good  Recall:  Good  Fund of Knowledge: Good  Language: Good  Akathisia:  No  Handed:  Ambidextrous  AIMS (if indicated): not done  Assets:  Communication Skills Desire for Improvement Housing Physical Health Social Support Transportation Vocational/Educational  ADL's:  Intact  Cognition: WNL  Sleep:  Fair   Screenings: AUDIT    Flowsheet Row Appointment from 01/13/2023 in [facility]  Alcohol Use Disorder Identification Test Final Score (AUDIT) 5      GAD-7    Flowsheet Row Video Visit from 11/05/2023 in [facility] Video Visit from 09/24/2023 in [facility] Video Visit from 08/06/2023 in [facility] Video Visit from 06/18/2023 in [facility] Video Visit from 05/14/2023 in [facility]  Total GAD-7 Score 10 15 20 20 19       [phone]  Flowsheet Row Video Visit from 11/05/2023 in [facility] Office Visit from 10/06/2023 in [facility] Of [facility] Video Visit from 09/24/2023 in [facility] Video Visit from 08/06/2023 in [facility] Video Visit from 06/18/2023 in [facility]  PHQ-2 Total Score 0 0 0 0 0  PHQ-9 Total Score -- 7 -- -- --      Flowsheet Row Video Visit from 11/05/2023 in [facility] Video Visit from 09/24/2023 in [facility] Video Visit from 08/06/2023 in [facility]  C-SSRS RISK CATEGORY Moderate Risk Moderate Risk Moderate Risk        Assessment and Plan:   Jamie Shields is a 43 year old, Caucasian female with a past psychiatric history significant for major depressive disorder with anxious distress, and generalized anxiety disorder who presents to [facility] via virtual video visit for follow-up and medication management.  During the encounter, provider went over patient's urine drug screen results.  Patient's urine drug screen results were significant for the presence of amphetamine and benzodiazepine; however, patient is not being prescribed any of these controlled substances by this provider.  Patient admitted to taking Valium and Adderall under the table stating that she knows what she has been through and knows what works for her.  Provider informed patient that in order to take Adderall she must receive an official diagnosis of ADHD.  Patient vocalized understanding.  Due to patient utilizing controlled substances through her unknown means, provider would not be prescribing her controlled substances from here on out.  Patient will need to go  elsewhere in order to receive controlled substances.  Patient reports that she continues to take her other medications regularly and denies experiencing any issues or concerns.  Patient denies overt depressive symptoms but continues to endorse anxiety attributed to stressors in her life.  Provider to be e-prescribed patient's medication to pharmacy of choice.  Collaboration of Care: Collaboration of Care: Medication Management AEB provider managing patient's psychiatric medications, Psychiatrist AEB patient being seen by mental health provider at this facility, and Referral or follow-up with counselor/therapist AEB patient being seen by a licensed clinical social worker at this facility  Patient/Guardian was advised Release of Information must be obtained prior to any record release in order to collaborate their care with an outside provider. Patient/Guardian was advised if they have not already done so to contact the registration department to sign all necessary forms in order for us  to release information regarding their care.   Consent: Patient/Guardian gives verbal consent for treatment and assignment of benefits for services provided during this visit. Patient/Guardian expressed understanding and agreed to proceed.   1. Generalized anxiety disorder  - mirtazapine (REMERON) 45 MG tablet; Take 1 tablet (45 mg total) by mouth at bedtime.  Dispense: 30 tablet; Refill: 1 - busPIRone (BUSPAR) 10 MG tablet; Take 2 tablets (20 mg total) by mouth 2 (two) times daily.  Dispense: 120 tablet; Refill: 1  Patient to follow-up in 6 weeks Provider spent a total of 16 minutes with the patient/reviewing patient's chart  Gates Kasal, PA 11/05/2023, 3:00 PM

## 2023-11-05 NOTE — Telephone Encounter (Signed)
 Please advise [facility]

## 2023-11-06 DIAGNOSIS — Z419 Encounter for procedure for purposes other than remedying health state, unspecified: Secondary | ICD-10-CM | POA: Diagnosis not present

## 2023-11-16 ENCOUNTER — Other Ambulatory Visit (HOSPITAL_COMMUNITY): Payer: Self-pay

## 2023-11-18 ENCOUNTER — Other Ambulatory Visit (HOSPITAL_COMMUNITY): Payer: Self-pay

## 2023-12-02 DIAGNOSIS — M5030 Other cervical disc degeneration, unspecified cervical region: Secondary | ICD-10-CM | POA: Insufficient documentation

## 2023-12-06 ENCOUNTER — Ambulatory Visit: Payer: Self-pay

## 2023-12-06 DIAGNOSIS — Z419 Encounter for procedure for purposes other than remedying health state, unspecified: Secondary | ICD-10-CM | POA: Diagnosis not present

## 2023-12-06 NOTE — Telephone Encounter (Signed)
 Copied from CRM [id number]. Topic: Clinical - Red Word Triage >> Dec 06, 2023  9:08 AM Everlene Hobby D wrote: Bulging  disc in neck and causing a lot pain and it's interfering with work for about a week. And causing her left should to draw up.    Chief Complaint: Left side neck pain and into shoulder. Asking to be worked in Friday 12/10/23. Symptoms: Pain Frequency:  Pertinent Negatives: Patient denies any injury Disposition: [] ED /[] Urgent Care (no appt availability in office) / [] Appointment(In office/virtual)/ []  [facility]/ [] Home Care/ [] Refused Recommended Disposition /[] [place] [facility]/ [x]  Follow-up with PCP Additional Notes: Please advise pt.  Reason for Disposition  [1] MODERATE neck pain (e.g., interferes with normal activities) AND [2] present > 3 days  Answer Assessment - Initial Assessment Questions 1. ONSET: "When did the pain begin?"      Last week 2. LOCATION: "Where does it hurt?"      Left side 3. PATTERN "Does the pain come and go, or has it been constant since it started?"      Constant 4. SEVERITY: "How bad is the pain?"  (Scale 1-10; or mild, moderate, severe)   - NO PAIN (0): no pain or only slight stiffness    - MILD (1-3): doesn't interfere with normal activities    - MODERATE (4-7): interferes with normal activities or awakens from sleep    - SEVERE (8-10):  excruciating pain, unable to do any normal activities      6 5. RADIATION: "Does the pain go anywhere else, shoot into your arms?"     Shoulder 6. CORD SYMPTOMS: "Any weakness or numbness of the arms or legs?"     No 7. CAUSE: "What do you think is causing the neck pain?"     Unsure 8. NECK OVERUSE: "Any recent activities that involved turning or twisting the neck?"     No 9. OTHER SYMPTOMS: "Do you have any other symptoms?" (e.g., headache, fever, chest pain, difficulty breathing, neck swelling)     No 10. PREGNANCY: "Is there any chance you are pregnant?" "When was your last menstrual  period?"       No  Protocols used: Neck Pain or Stiffness-A-AH

## 2023-12-08 ENCOUNTER — Encounter (HOSPITAL_COMMUNITY)

## 2023-12-09 ENCOUNTER — Telehealth (HOSPITAL_COMMUNITY): Payer: Self-pay | Admitting: *Deleted

## 2023-12-09 NOTE — Telephone Encounter (Signed)
 Called to confirm/remind patient of their appointment [facility] on 12/10/23.      Appointment:              [x] Confirmed             [] Left mess              [] No answer/No voice mail             [] Phone not in service   Patient reminded to bring all medications and/or complete list.   Confirmed patient has transportation. Gave directions, instructed to utilize valet parking.

## 2023-12-10 ENCOUNTER — Encounter: Payer: Self-pay | Admitting: Nurse Practitioner

## 2023-12-10 ENCOUNTER — Ambulatory Visit (HOSPITAL_COMMUNITY)
Admission: RE | Admit: 2023-12-10 | Discharge: 2023-12-10 | Disposition: A | Discharge status: Home / Self Care | Source: Ambulatory Visit | Attending: Family Medicine | Admitting: Family Medicine

## 2023-12-10 ENCOUNTER — Ambulatory Visit: Payer: Self-pay | Admitting: Nurse Practitioner

## 2023-12-10 ENCOUNTER — Encounter (HOSPITAL_COMMUNITY): Payer: Self-pay

## 2023-12-10 VITALS — BP 146/84 | HR 106 | Ht 65.0 in | Wt 99.0 lb

## 2023-12-10 VITALS — BP 139/90 | HR 103 | Temp 98.5°F | Wt 92.8 lb

## 2023-12-10 DIAGNOSIS — Z87891 Personal history of nicotine dependence: Secondary | ICD-10-CM | POA: Insufficient documentation

## 2023-12-10 DIAGNOSIS — E46 Unspecified protein-calorie malnutrition: Secondary | ICD-10-CM | POA: Diagnosis not present

## 2023-12-10 DIAGNOSIS — S161XXA Strain of muscle, fascia and tendon at neck level, initial encounter: Secondary | ICD-10-CM | POA: Diagnosis not present

## 2023-12-10 DIAGNOSIS — I082 Rheumatic disorders of both aortic and tricuspid valves: Secondary | ICD-10-CM | POA: Diagnosis not present

## 2023-12-10 DIAGNOSIS — Z79899 Other long term (current) drug therapy: Secondary | ICD-10-CM | POA: Insufficient documentation

## 2023-12-10 DIAGNOSIS — I5022 Chronic systolic (congestive) heart failure: Secondary | ICD-10-CM | POA: Diagnosis not present

## 2023-12-10 DIAGNOSIS — Z5986 Financial insecurity: Secondary | ICD-10-CM | POA: Diagnosis not present

## 2023-12-10 DIAGNOSIS — I351 Nonrheumatic aortic (valve) insufficiency: Secondary | ICD-10-CM

## 2023-12-10 DIAGNOSIS — I255 Ischemic cardiomyopathy: Secondary | ICD-10-CM | POA: Diagnosis not present

## 2023-12-10 DIAGNOSIS — F1011 Alcohol abuse, in remission: Secondary | ICD-10-CM

## 2023-12-10 DIAGNOSIS — Z681 Body mass index (BMI) 19 or less, adult: Secondary | ICD-10-CM | POA: Insufficient documentation

## 2023-12-10 LAB — BASIC METABOLIC PANEL WITH GFR
Anion gap: 9 (ref 5–15)
BUN: 6 mg/dL (ref 6–20)
CO2: 23 mmol/L (ref 22–32)
Calcium: 9.8 mg/dL (ref 8.9–10.3)
Chloride: 106 mmol/L (ref 98–111)
Creatinine, Ser: 0.82 mg/dL (ref 0.44–1.00)
GFR, Estimated: >60 mL/min (ref >60)
Glucose, Bld: 89 mg/dL (ref 70–99)
Potassium: 3.4 mmol/L — ABNORMAL LOW (ref 3.5–5.1)
Sodium: 138 mmol/L (ref 135–145)

## 2023-12-10 LAB — BRAIN NATRIURETIC PEPTIDE: B Natriuretic Peptide: 30.2 pg/mL (ref 0.0–100.0)

## 2023-12-10 MED ORDER — KETOROLAC TROMETHAMINE 30 MG/ML IJ SOLN
30.0000 mg | Freq: Once | INTRAMUSCULAR | Status: AC
  Administered 2023-12-10: 30 mg via INTRAMUSCULAR

## 2023-12-10 MED ORDER — DAPAGLIFLOZIN PROPANEDIOL 10 MG PO TABS: 10.0000 mg | ORAL_TABLET | Freq: Every day | ORAL | 11 refills | Status: AC

## 2023-12-10 MED ORDER — CYCLOBENZAPRINE HCL 10 MG PO TABS
10.0000 mg | ORAL_TABLET | Freq: Three times a day (TID) | ORAL | 0 refills | Status: DC | PRN
Start: 2023-12-10 — End: 2023-12-21

## 2023-12-10 MED ORDER — METOPROLOL SUCCINATE ER 25 MG PO TB24: 25.0000 mg | ORAL_TABLET | Freq: Every day | ORAL | 11 refills | Status: AC

## 2023-12-10 MED ORDER — ENTRESTO 24-26 MG PO TABS: 1.0000 | ORAL_TABLET | Freq: Two times a day (BID) | ORAL | 11 refills | Status: AC

## 2023-12-10 NOTE — Progress Notes (Signed)
 Subjective   Patient ID: Jamie Shields, female    DOB: October 13, 1980, 43 y.o.   MRN: [id number]  Chief Complaint  Patient presents with   Neck Pain    Patient stated that she has been experiencing neck pain and it is going down to her shoulders     Referring provider: Jerrlyn Morel, NP  Dorrene Sammi Crick is a 43 y.o. female with Past Medical History: No date: Anxiety No date: Bipolar 1 disorder (HCC) No date: Bursitis of hip No date: Chronic bronchitis No date: Hip joint pain No date: Seizures (HCC) No date: Shock [facility])     Comment:  hypovolemic  HPI:   Neck Pain  This is a new problem. The current episode started 1 to 4 weeks ago. The problem occurs intermittently. The problem has been gradually improving. The pain is associated with lifting a heavy object. The pain is present in the left side. The quality of the pain is described as aching. The pain is at a severity of 7/10. The pain is moderate. The symptoms are aggravated by position. The pain is Same all the time. Stiffness is present All day. She has tried acetaminophen  and NSAIDs for the symptoms. The treatment provided mild relief.      Allergies  Allergen Reactions   Cucumber Extract Anaphylaxis   Keflex  [Cephalexin ] Nausea And Vomiting   Macrobid  [Nitrofurantoin  Monohydrate Macrocrystals] Hives and Swelling   Naproxen  Nausea And Vomiting   Penicillins Nausea And Vomiting    Heavy vomitting Did PCN reaction causing immediate rash, facial/tongue/throat swelling, SOB or lightheadedness with hypotension: No swelling, but im not sure if i got a rash as i was red all over already Did PCN reaction causing severe rash involving mucus membranes or skin necrosis: No Did a PCN reaction that required hospitalization Yes went to the ED Has patient had a PCN reaction occurring within the last 10 years: No-childhood allergy If all of the above answers are "NO", then may proceed with   Tramadol  Nausea And Vomiting   Watermelon  Concentrate Diarrhea   Darvocet [Propoxyphene N-Acetaminophen ] Nausea And Vomiting and Other (See Comments)    migraines   Flagyl  [Metronidazole  Hcl] Hives, Swelling and Rash   Ketorolac  Nausea And Vomiting and Other (See Comments)    migraines   Ultram  [Tramadol  Hcl] Nausea And Vomiting and Other (See Comments)    migraines   Vicodin [Hydrocodone -Acetaminophen ] Nausea And Vomiting and Other (See Comments)    migraines     There is no immunization history on file for this patient.  Tobacco History: Social History   Tobacco Use  Smoking Status Former   Current packs/day: 0.50   Types: Cigarettes, E-cigarettes   Start date: 2018  Smokeless Tobacco Never  Tobacco Comments   Former smoker 07/03/22   Counseling given: Not Answered Tobacco comments: Former smoker 07/03/22   Outpatient Encounter Medications as of 12/10/2023  Medication Sig   busPIRone  (BUSPAR ) 10 MG tablet Take 2 tablets (20 mg total) by mouth 2 (two) times daily.   carvedilol  (COREG ) 6.25 MG tablet Take 1 tablet (6.25 mg total) by mouth 2 (two) times daily. NEEDS FOLLOW UP APPOINTMENT FOR MORE REFILLS   cyclobenzaprine (FLEXERIL) 10 MG tablet Take 1 tablet (10 mg total) by mouth 3 (three) times daily as needed for muscle spasms.   furosemide  (LASIX ) 20 MG tablet TAKE 1 TABLET (20 MG) BY MOUTH DAILY   levETIRAcetam  (KEPPRA ) 500 MG tablet Take 1 tablet (500 mg total) by mouth  2 (two) times daily.   mirtazapine  (REMERON ) 45 MG tablet Take 1 tablet (45 mg total) by mouth at bedtime.   [DISCONTINUED] tiZANidine  (ZANAFLEX ) 4 MG tablet TAKE 1 TABLET BY MOUTH EVERY 6 HOURS AS NEEDED FOR MUSCLE SPASMS.   dapagliflozin  propanediol (FARXIGA ) 10 MG TABS tablet TAKE 1 TABLET (10 MG TOTAL) BY MOUTH DAILY. (Patient not taking: Reported on 10/06/2023)   fluticasone  (FLONASE ) 50 MCG/ACT nasal spray PLACE 2 SPRAYS INTO BOTH NOSTRILS DAILY. (Patient not taking: Reported on 10/06/2023)   nystatin  (MYCOSTATIN ) 100000 UNIT/ML suspension  Take 5 mLs (500,000 Units total) by mouth 4 (four) times daily.   ondansetron  (ZOFRAN -ODT) 4 MG disintegrating tablet Take 1 tablet (4 mg total) by mouth every 8 (eight) hours as needed.   pantoprazole  (PROTONIX ) 40 MG tablet Take 1 tablet (40 mg total) by mouth daily.   sacubitril -valsartan  (ENTRESTO ) 24-26 MG Take 1 tablet by mouth 2 (two) times daily. (Patient not taking: Reported on 10/06/2023)   spironolactone  (ALDACTONE ) 25 MG tablet Take 1 tablet (25 mg total) by mouth daily. (Patient not taking: Reported on 10/06/2023)   thiamine  (VITAMIN B1) 100 MG tablet Take 1 tablet (100 mg total) by mouth daily. (Patient not taking: Reported on 10/06/2023)   Facility-Administered Encounter Medications as of 12/10/2023  Medication   ketorolac  (TORADOL ) 30 MG/ML injection 30 mg    Review of Systems  Review of Systems  Constitutional: Negative.   HENT: Negative.    Cardiovascular: Negative.   Gastrointestinal: Negative.   Musculoskeletal:  Positive for neck pain.  Allergic/Immunologic: Negative.   Neurological: Negative.   Psychiatric/Behavioral: Negative.       Objective:   BP (!) 139/90   Pulse (!) 103   Temp 98.5 F (36.9 C) (Oral)   Wt 92 lb 12.8 oz (42.1 kg)   SpO2 100%   BMI 15.44 kg/m   Wt Readings from Last 5 Encounters:  12/10/23 92 lb 12.8 oz (42.1 kg)  10/06/23 92 lb 11.2 oz (42 kg)  04/07/23 100 lb (45.4 kg)  01/26/23 98 lb 9.6 oz (44.7 kg)  09/25/22 97 lb 6.4 oz (44.2 kg)     Physical Exam Vitals and nursing note reviewed.  Constitutional:      General: She is not in acute distress.    Appearance: She is well-developed.  Cardiovascular:     Rate and Rhythm: Normal rate and regular rhythm.  Pulmonary:     Effort: Pulmonary effort is normal.     Breath sounds: Normal breath sounds.  Neurological:     Mental Status: She is alert and oriented to person, place, and time.       Assessment & Plan:   Strain of neck muscle, initial encounter -      Cyclobenzaprine HCl; Take 1 tablet (10 mg total) by mouth 3 (three) times daily as needed for muscle spasms.  Dispense: 30 tablet; Refill: 0 -     Ketorolac  Tromethamine      Return in about 3 months (around 03/11/2024).    Jerrlyn Morel, NP 12/10/2023

## 2023-12-10 NOTE — Patient Instructions (Signed)
 Stop Coreg . Start Toprol XL 25 mg daily - Rx sent. Re-start Farxiga  10 mg daily - Rx sent. Re-start Entresto  24/26 mg twice daily - Rx sent. Labs today - will call you if abnormal. Repeat labs in 2 weeks - see below. Echo has been ordered and we will call you to schedule after obtaining insurance approval. Return to Heart Failure Pharmacy Clinic in 3 - 4 weeks. See below. Return to see Dr. Mitzie Anda in 6 months. PLEASE CALL US  AT [phone] IN OCTOBER TO SCHEDULE. Please call us  at [phone] if any questions or concerns prior to your next visit.

## 2023-12-10 NOTE — Progress Notes (Signed)
 ADVANCED HF CLINIC NOTE  Primary Care: Jamie Morel, NP HF Cardiologist: Dr. Mitzie Anda  Jamie Shields is a 43 y.o.Jamie Shields female with PMH of substance abuse, chronic bronchitis, hypovolemic shock, protein calorie malnutrition, seizures, bipolar disorder, and diagnosis of systolic heart failure in 10/23.    She was admitted 10/23 for hypovolemic shock requiring IVF, pressors, midodrine  and stress dose corticosteroids.  Echo in 10/23 showed EF 70-75%, no MR, mild TR, suspicious for severe aortic insufficiency. ?Severe aortic regurgitation.    Admitted again 11/23 with acute combined systolic and diastolic heart failure. Echo showed EF 30-35%, mild RV dysfunction, small-moderate pericardial effusion, probably moderate AI, and dilated IVC. This was significantly different from the 10/23 study. Diuresed with IV lasix , underwent L/RHC showing mildly elevated filling pressures, preserved CO and no significant CAD. Felt thiamine  deficiency/Beriberi syndrome or a form of stress/Takotsubo cardiomyopathy possible. GDMT titrated. Underwent TEE to better assess AI, which showed mild to moderate AI. Cardiac MRI showed LVEF 38%, RVEF 41%, no LGE, ECV 38%. Discharged home, weight 100 lbs at discharge (down 15 lbs total during hospitalization).  Strong cardiac family history. Dad has had MI, mom with MI and HTN, maternal grandfather had an aneurysm and multiple MIs, and paternal grandfather had CABG x 3 and multiple MIs. No history in siblings or children.  Echo 3/24 EF up to 55-60% with normal RV and mild AI.   Today she returns for HF follow up. Overall feeling fine. 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 No SOB with activity or work duties. Denies  palpitations, abnormal bleeding, CP, dizziness, edema, or PND/Orthopnea. Appetite ok. Weight at home 92 pounds. Takes Coreg  once every other day, off all other GDMT since 07/2023, ran out of meds. Vapes daily, no ETOH or drugs.  ECG (personally reviewed): NSR 88 bpm  Labs  (12/23): K 3.8, creatinine 0.69, digoxin  0.6, BNP 87.5 Labs (2/25): UDS + benzos and amphetamines Labs (3/25): K 3.8, creatinine 1.02, LDL 58  PMH: 1. Bipolar disorder 2. Malnutrition 3. H/o ETOH abuse: Has quit.  4. ?H/o drug abuse: Listed in chart but patient denies.  5. Chronic systolic CHF: Nonischemic cardiomyopathy.  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 - Echo (10/23): EF 70-75%, no MR, mild TR, suspicious for severe aortic insufficiency. ?Severe aortic regurgitation - TEE (11/23) : EF 30%, mild RV dysfunction, trileaflet aortic valve with mild-moderate central AI and no AS.  - R/LHC (11/23): no significant CAD, RA mean 13, PA 42/23 mean 33, PCWP mean 19, CO/CI (Fick) 4.04/2.6, PVR 3.4 WU - Echo (11/23): EF 30-35%, LV with global hypokinesis, RV mildly enlarged, small to mod pericardial effusion, mild MR, mild to mod TR, Mod AV regurgitation. Pulmonic valve regurgitation moderate.  - Cardiac MRI (11/23): Moderate pericardial effusion, EF 38%, RV EF 41%, moderate AI visually, no myocardial LGE, ECV 38%.  - Echo (2/24): EF 55-60%, RV normal, IVC normal, mild AI.  6. Aortic insufficiency: Mild on 2/24 echo, appeared worse on prior studies.    ROS: All systems reviewed and negative except as per HPI.    Current Outpatient Medications  Medication Sig Dispense Refill   busPIRone  (BUSPAR ) 15 MG tablet Take 30 mg by mouth 2 (two) times daily.     carvedilol  (COREG ) 6.25 MG tablet Take 1 tablet (6.25 mg total) by mouth 2 (two) times daily. NEEDS FOLLOW UP APPOINTMENT FOR MORE REFILLS 180 tablet 0   cyclobenzaprine (FLEXERIL) 10 MG tablet Take 1 tablet (10 mg total) by mouth 3 (three) times daily as needed  for muscle spasms. 30 tablet 0   furosemide  (LASIX ) 20 MG tablet TAKE 1 TABLET (20 MG) BY MOUTH DAILY 30 tablet 1   levETIRAcetam  (KEPPRA ) 500 MG tablet Take 1 tablet (500 mg total) by mouth 2 (two) times daily. 60 tablet 1   mirtazapine  (REMERON ) 45 MG tablet Take 1 tablet (45 mg  total) by mouth at bedtime. 30 tablet 1   No current facility-administered medications for this encounter.   Allergies  Allergen Reactions   Cucumber Extract Anaphylaxis   Keflex  [Cephalexin ] Nausea And Vomiting   Macrobid  [Nitrofurantoin  Monohydrate Macrocrystals] Hives and Swelling   Naproxen  Nausea And Vomiting   Penicillins Nausea And Vomiting    Heavy vomitting Did PCN reaction causing immediate rash, facial/tongue/throat swelling, SOB or lightheadedness with hypotension: No swelling, but im not sure if i got a rash as i was red all over already Did PCN reaction causing severe rash involving mucus membranes or skin necrosis: No Did a PCN reaction that required hospitalization Yes went to the ED Has patient had a PCN reaction occurring within the last 10 years: No-childhood allergy If all of the above answers are "NO", then may proceed with   Tramadol  Nausea And Vomiting   Watermelon Concentrate Diarrhea   Darvocet [Propoxyphene N-Acetaminophen ] Nausea And Vomiting and Other (See Comments)    migraines   Flagyl  [Metronidazole  Hcl] Hives, Swelling and Rash   Ketorolac  Nausea And Vomiting and Other (See Comments)    migraines   Ultram  [Tramadol  Hcl] Nausea And Vomiting and Other (See Comments)    migraines   Vicodin [Hydrocodone -Acetaminophen ] Nausea And Vomiting and Other (See Comments)    migraines   Social History   Socioeconomic History   Marital status: Legally Separated    Spouse name: Not on file   Number of children: Not on file   Years of education: Not on file   Highest education level: Associate degree: academic program  Occupational History   Not on file  Tobacco Use   Smoking status: Former    Current packs/day: 0.50    Types: Cigarettes, E-cigarettes    Start date: 2018   Smokeless tobacco: Never   Tobacco comments:    Former smoker 07/03/22  Vaping Use   Vaping status: Some Days  Substance and Sexual Activity   Alcohol use: Not Currently    Comment:  rarely   Drug use: Not Currently   Sexual activity: Yes    Birth control/protection: None  Other Topics Concern   Not on file  Social History Narrative   Not on file   Social Drivers of Health   Financial Resource Strain: Medium Risk (10/06/2023)   Overall Financial Resource Strain (CARDIA)    Difficulty of Paying Living Expenses: Somewhat hard  Food Insecurity: Food Insecurity Present (10/06/2023)   Hunger Vital Sign    Worried About Running Out of Food in the Last Year: Sometimes true    Ran Out of Food in the Last Year: Patient declined  Transportation Needs: No Transportation Needs (01/12/2023)   PRAPARE - Administrator, Civil Service (Medical): No    Lack of Transportation (Non-Medical): No  Physical Activity: Unknown (10/06/2023)   Exercise Vital Sign    Days of Exercise per Week: 7 days    Minutes of Exercise per Session: Patient declined  Stress: Stress Concern Present (10/06/2023)   [employer] of Occupational Health - Occupational Stress Questionnaire    Feeling of Stress : Very much  Social Connections: Unknown (10/06/2023)  Social Advertising account executive [NHANES]    Frequency of Communication with Friends and Family: Once a week    Frequency of Social Gatherings with Friends and Family: Patient declined    Attends Religious Services: 1 to 4 times per year    Active Member of [employer] or Organizations: No    Attends Engineer, structural: Not on file    Marital Status: Separated  Intimate Partner Violence: Not At Risk (06/03/2022)   Humiliation, Afraid, Rape, and Kick questionnaire    Fear of Current or Ex-Partner: No    Emotionally Abused: No    Physically Abused: No    Sexually Abused: No   Family History  Problem Relation Age of Onset   Heart attack Mother    Hypertension Mother    Heart attack Father    Heart attack Maternal Grandfather    Heart attack Paternal Grandfather    Diabetes Other    Cancer Other    BP (!) 146/84    Pulse (!) 106   Ht 5\' 5"  (1.651 m)   Wt 44.9 kg (99 lb)   BMI 16.47 kg/m   Wt Readings from Last 3 Encounters:  12/10/23 44.9 kg (99 lb)  12/10/23 42.1 kg (92 lb 12.8 oz)  10/06/23 42 kg (92 lb 11.2 oz)   PHYSICAL EXAM: General:  NAD. No resp difficulty, thin HEENT: Normal Neck: Supple. No JVD. Cor: Tachy rate & rhythm. No rubs, gallops or murmurs. Lungs: Clear Abdomen: Soft, nontender, nondistended.  Extremities: No cyanosis, clubbing, rash, edema Neuro: Alert & oriented x 3, moves all 4 extremities w/o difficulty. Affect pleasant.  ASSESSMENT & PLAN: 1. Chronic systolic CHF: Nonischemic cardiomyopathy.  Echo in 11/23 with EF 30-35%, mild RV dysfunction, small-moderate pericardial effusion, probably moderate AI, and dilated IVC. This is surprising given prior echo in 10/23 showed EF 70-75%, normal RV, and possible severe AI (not well-visualized).  BNP was markedly elevated. LHC/RHC showed mildly elevated filling pressures, preserved cardiac output, and no significant coronary disease.  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 cMRI 11/23 showed LVEF 38%, RVEF 41%, no LGE, elevated ECV.  Repeat echo was done today with EF up to 55-60% with mild AI.  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  NYHA class I symptoms, not volume overloaded on exam.  - Stop Coreg , start Toprol XL 25 mg daily (reduce pill burden). - Restart Entresto  24/26 mg bid - Restart Farxiga  10 mg daily - Continue Lasix  20 mg PRN - Add spiro next visit.  - Repeat echo. Hopefully EF has remained preserved. 2. Aortic insufficiency: ?Severe on 10/23 echo.  TEE  showed mild-moderate AI with trileaflet valve. Most recent echo with only mild AI.  - As above, repeat echo 3. Psych: Anxiety, ?PTSD, ?bipolar disorder.  Suspect this plays a role in her malnutrition.  - She follows with psychiatry. 4. Protein  Malnutrition: Body mass index is 16.47 kg/m. - She says she has a good appetite. She is on mirtazapine . 5. ?Adrenal insufficiency: Did not have full workup of this last admission but not on steroids and probably not adrenally insufficient.  6. Prior heavy ETOH: She has quit. - Congratulated. 7. Prior IVDU: Listed in chart but she denies.    Update echo.  Follow up in 3-4 weeks with PharmD (add back spiro) and 6 months with Dr. America Bake [facility] FNP-BC 12/10/23

## 2023-12-10 NOTE — Patient Instructions (Signed)
 1. Strain of neck muscle, initial encounter (Primary)  - cyclobenzaprine (FLEXERIL) 10 MG tablet; Take 1 tablet (10 mg total) by mouth 3 (three) times daily as needed for muscle spasms.  Dispense: 30 tablet; Refill: 0 - ketorolac  (TORADOL ) 30 MG/ML injection 30 mg

## 2023-12-11 ENCOUNTER — Ambulatory Visit (HOSPITAL_COMMUNITY): Payer: Self-pay | Admitting: Family Medicine

## 2023-12-13 ENCOUNTER — Other Ambulatory Visit (HOSPITAL_COMMUNITY): Payer: Self-pay

## 2023-12-13 ENCOUNTER — Telehealth (HOSPITAL_COMMUNITY): Payer: Self-pay

## 2023-12-13 NOTE — Telephone Encounter (Signed)
 Advanced Heart Failure Patient Advocate Encounter  Prior authorization for Farxiga  has been submitted and approved. Test billing returns $4 for 90 day supply.  Key: [id number] Effective: 11/29/2023 to 12/12/2024  Kennis Peacock, CPhT Rx Patient Advocate Phone: [phone]

## 2023-12-17 ENCOUNTER — Encounter (HOSPITAL_COMMUNITY): Payer: Self-pay

## 2023-12-17 ENCOUNTER — Telehealth (HOSPITAL_COMMUNITY): Admitting: Physician Assistant

## 2023-12-21 ENCOUNTER — Other Ambulatory Visit: Payer: Self-pay | Admitting: Nurse Practitioner

## 2023-12-21 ENCOUNTER — Other Ambulatory Visit (HOSPITAL_COMMUNITY): Payer: Self-pay | Admitting: Physician Assistant

## 2023-12-21 DIAGNOSIS — S161XXA Strain of muscle, fascia and tendon at neck level, initial encounter: Secondary | ICD-10-CM

## 2023-12-21 DIAGNOSIS — F411 Generalized anxiety disorder: Secondary | ICD-10-CM

## 2023-12-21 NOTE — Telephone Encounter (Signed)
 Please advise [facility]

## 2023-12-24 ENCOUNTER — Ambulatory Visit (HOSPITAL_COMMUNITY)
Admission: RE | Admit: 2023-12-24 | Discharge: 2023-12-24 | Disposition: A | Discharge status: Home / Self Care | Source: Ambulatory Visit | Attending: Internal Medicine | Admitting: Internal Medicine

## 2023-12-24 DIAGNOSIS — I5022 Chronic systolic (congestive) heart failure: Secondary | ICD-10-CM | POA: Diagnosis not present

## 2023-12-24 LAB — BASIC METABOLIC PANEL WITH GFR
Anion gap: 11 (ref 5–15)
BUN: 11 mg/dL (ref 6–20)
CO2: 24 mmol/L (ref 22–32)
Calcium: 9.6 mg/dL (ref 8.9–10.3)
Chloride: 104 mmol/L (ref 98–111)
Creatinine, Ser: 0.87 mg/dL (ref 0.44–1.00)
GFR, Estimated: >60 mL/min (ref >60)
Glucose, Bld: 54 mg/dL — ABNORMAL LOW (ref 70–99)
Potassium: 4 mmol/L (ref 3.5–5.1)
Sodium: 139 mmol/L (ref 135–145)

## 2024-01-04 ENCOUNTER — Other Ambulatory Visit: Payer: Self-pay | Admitting: Nurse Practitioner

## 2024-01-04 DIAGNOSIS — S161XXA Strain of muscle, fascia and tendon at neck level, initial encounter: Secondary | ICD-10-CM

## 2024-01-04 NOTE — Telephone Encounter (Signed)
 Please advise [facility]

## 2024-01-06 DIAGNOSIS — Z419 Encounter for procedure for purposes other than remedying health state, unspecified: Secondary | ICD-10-CM | POA: Diagnosis not present

## 2024-01-10 NOTE — Progress Notes (Incomplete)
 ***In Progress***    Advanced Heart Failure Clinic Note  Primary Care: Jerrlyn Morel, NP HF Cardiologist: Dr. Mitzie Anda  HPI:  Jamie Shields is a 43 y.o.Jamie Shields female with PMH of substance abuse, chronic bronchitis, hypovolemic shock, protein calorie malnutrition, seizures, bipolar disorder, and diagnosis of systolic heart failure in 04/2022.    She was admitted 04/2022 for hypovolemic shock requiring IVF, pressors, midodrine  and stress dose corticosteroids.  Echo in 04/2022 showed EF 70-75%, no MR, mild TR, suspicious for severe aortic insufficiency. ?Severe aortic regurgitation.    Admitted again 05/2022 with acute combined systolic and diastolic heart failure. Echo showed EF 30-35%, mild RV dysfunction, small-moderate pericardial effusion, probably moderate AI, and dilated IVC. This was significantly different from the 04/2022 study. Diuresed with IV lasix , underwent L/RHC showing mildly elevated filling pressures, preserved CO and no significant CAD. Felt thiamine  deficiency/Beriberi syndrome or a form of stress/Takotsubo cardiomyopathy possible. GDMT titrated. Underwent TEE to better assess AI, which showed mild to moderate AI. Cardiac MRI showed LVEF 38%, RVEF 41%, no LGE, ECV 38%. Discharged home, weight 100 lbs at discharge (down 15 lbs total during hospitalization).   Strong cardiac family history. Dad has had MI, mom with MI and HTN, maternal grandfather had an aneurysm and multiple MIs, and paternal grandfather had CABG x 3 and multiple MIs. No history in siblings or children.   Echo 09/2022 EF up to 55-60% with normal RV and mild AI.    She returned for HF follow up with NP on 12/10/23. Overall reported feeling fine. 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 No SOB with activity or work duties noted. Denied palpitations, abnormal bleeding, CP, dizziness, edema, or PND/Orthopnea. Appetite was ok. Weight at home was 92 pounds. Reported taking carvedilol  once every other day, off all other GDMT since  07/2023, ran out of meds. Vaped daily, no ETOH or drug use reported.  Today she returns to HF clinic for pharmacist medication titration. At last visit with NP, Entresto  24/26 mg BID and Farxiga  10 mg daily was restarted. ***.   Shortness of breath/dyspnea on exertion? {YES NO:22349}  Orthopnea/PND? {YES NO:22349} Edema? {YES NO:22349} Lightheadedness/dizziness? {YES NO:22349} Daily weights at home? {YES NO:22349} Blood pressure/heart rate monitoring at home? {YES [id number] Following low-sodium/fluid-restricted diet? {YES NO:22349}  HF Medications: Metoprolol  succinate 25 mg daily Entresto  24/26 mg BID Farxiga  10 mg daily Furosemide  20 mg PRN  Has the patient been experiencing any side effects to the medications prescribed?  {YES NO:22349}  Does the patient have any problems obtaining medications due to transportation or finances?   No, patient has [place] Medicaid  Understanding of regimen: {excellent/good/fair/poor:19665} Understanding of indications: {excellent/good/fair/poor:19665} Potential of compliance: {excellent/good/fair/poor:19665} Patient understands to avoid NSAIDs. Patient understands to avoid decongestants.    Pertinent Lab Values:(12/24/23) Serum creatinine 0.87 mg/dL, BUN 11 mg/dL, Potassium 4.0 mmol/L, Sodium 139 mmol/L, BNP 30.2 pg/mL (12/10/23)  Vital Signs: Weight: *** (last clinic weight: 99 lbs) Blood pressure: *** (last clinic BP: 146/84 mmHg) *** Heart rate: *** (last clinic HR: 106 bpm)  Assessment/Plan: 1. Chronic systolic CHF: Nonischemic cardiomyopathy.  Echo in 05/2022 with EF 30-35%, mild RV dysfunction, small-moderate pericardial effusion, probably moderate AI, and dilated IVC. This is surprising given prior echo in 04/2022 showed EF 70-75%, normal RV, and possible severe AI (not well-visualized).  BNP was markedly elevated. LHC/RHC showed mildly elevated filling pressures, preserved cardiac output, and no significant coronary disease.  Would consider  thiamine  deficiency/Beriberi syndrome with malnutrition as well as stress/Takotsubo-type cardiomyopathy. cMRI 05/2022  showed LVEF 38%, RVEF 41%, no LGE, elevated ECV.  Repeat echo was done today with EF up to 55-60% with mild AI.  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   - NYHA class I symptoms, not volume overloaded on exam.  - Stop carvedilol , start metoprolol  succinate 25 mg daily (reduce pill burden). *** - Restart Entresto  24/26 mg bid *** - Restart Farxiga  10 mg daily *** - Continue Lasix  20 mg PRN *** - Add spiro next visit.  *** - Repeat echo. Hopefully EF has remained preserved.  2. Aortic insufficiency: ?Severe on 04/2022 echo.  TEE  showed mild-moderate AI with trileaflet valve. Most recent echo with only mild AI. *** - As above, repeat echo  3. Psych: Anxiety, ?PTSD, ?bipolar disorder.  Suspect this plays a role in her malnutrition.  - She follows with psychiatry. ***  4. Protein Malnutrition: Body mass index is 16.47 kg/m. *** - She says she has a good appetite. She is on mirtazapine .  5. ?Adrenal insufficiency: Did not have full workup of this last admission but not on steroids and probably not adrenally insufficient. ***  6. Prior heavy ETOH: She has quit. *** - Congratulated.  7. Prior IVDU: Listed in chart but she denies.  ***  Follow up ***  Jamie Shields, PharmD PGY-1 Acute Care Pharmacy Resident  Jamie Shields, PharmD, BCPS, [facility], CPP Heart Failure Clinic Pharmacist [phone]

## 2024-01-11 ENCOUNTER — Inpatient Hospital Stay (HOSPITAL_COMMUNITY)
Admission: RE | Admit: 2024-01-11 | Discharge: 2024-01-11 | Disposition: A | Discharge status: Home / Self Care | Source: Ambulatory Visit

## 2024-01-13 ENCOUNTER — Other Ambulatory Visit: Payer: Self-pay | Admitting: Nurse Practitioner

## 2024-01-13 DIAGNOSIS — S161XXA Strain of muscle, fascia and tendon at neck level, initial encounter: Secondary | ICD-10-CM

## 2024-01-14 NOTE — Telephone Encounter (Signed)
 Please advise [facility]

## 2024-01-31 ENCOUNTER — Other Ambulatory Visit (HOSPITAL_COMMUNITY): Payer: Self-pay | Admitting: Physician Assistant

## 2024-01-31 DIAGNOSIS — F411 Generalized anxiety disorder: Secondary | ICD-10-CM

## 2024-02-01 ENCOUNTER — Ambulatory Visit (HOSPITAL_COMMUNITY)

## 2024-02-05 DIAGNOSIS — Z419 Encounter for procedure for purposes other than remedying health state, unspecified: Secondary | ICD-10-CM | POA: Diagnosis not present

## 2024-02-08 NOTE — Progress Notes (Signed)
 Advanced Heart Failure Clinic Note  Primary Care: Jamie Bascom RAMAN, NP HF Cardiologist: Dr. Rolan  HPI:  Jamie Shields is a 43 y.o. female with PMH of substance abuse, chronic bronchitis, hypovolemic shock, protein calorie malnutrition, seizures, bipolar disorder, and diagnosis of systolic heart failure in 04/2022.    She was admitted 04/2022 for hypovolemic shock requiring IVF, pressors, midodrine  and stress dose corticosteroids. Echo in 04/2022 showed EF 70-75%, no MR, mild TR, suspicious for severe aortic insufficiency.    Admitted again 05/2022 with acute combined systolic and diastolic heart failure. Echo showed EF 30-35%, mild RV dysfunction, small-moderate pericardial effusion, probably moderate AI, and dilated IVC. This was significantly different from the 04/2022 study. Diuresed with IV lasix , underwent L/RHC showing mildly elevated filling pressures, preserved CO and no significant CAD. Felt thiamine  deficiency/Beriberi syndrome or a form of stress/Takotsubo cardiomyopathy possible. GDMT titrated. Underwent TEE to better assess AI, which showed mild to moderate AI. Cardiac MRI showed LVEF 38%, RVEF 41%, no LGE, ECV 38%. Discharged home, weight 100 lbs at discharge (down 15 lbs total during hospitalization).   Strong cardiac family history. Dad has had MI, mom with MI and HTN, maternal grandfather had an aneurysm and multiple MIs, and paternal grandfather had CABG x 3 and multiple MIs. No history in siblings or children.   Echo 09/2022 EF up to 55-60% with normal RV and mild AI.    She returned for HF follow up with NP on 12/10/2023. Overall reported feeling fine. 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 No SOB with activity or work duties noted. Denied palpitations, abnormal bleeding, CP, dizziness, edema, or PND/Orthopnea. Appetite was ok. Weight at home was 92 pounds. Reported taking carvedilol  once every other day, off all other GDMT since 07/2023, ran out of meds. Vaped daily, no ETOH or  drug use reported.  ECHO 02/09/2024 showed LVEF 55-60%. RV normal.   Today she returns to HF clinic for pharmacist medication titration with her fianc/caregiver. At last visit with NP, Entresto  24/26 mg BID and Farxiga  10 mg daily were restarted. Additionally, carvedilol  was stopped and metoprolol  succinate 25 mg daily was started to help with pill burden. Overall today she says she is feeling pretty good but mentions that the past week she has had some difficulty with her memory and feeling slower, and slightly more fatigued than her normal. Endorses some episodes of dizziness / lightheadedness which she says are random, but more times than not they occur when she goes from a sitting to standing position. They last for ~ 5 minutes and then go away. Denies CP, palpitations. She says her breathing has been fine, but had a couple of days last week where she became SOB at work because she had been moving and on her feet for too long. Said that she often becomes winded after walking the length of her driveway, ~ 1/8 mile, but some days is able to do so comfortably. She has not been weighing herself at home. No LEE today. Mentions she only has to take furosemide  once every 1-2 months for swelling. Swelling mainly occurs if she has been on her feet or working for too long. She last took a dose 1-2 months ago. Denies PND / orthopnea. Says her appetite has been good, and does try to watch her salt intake. She mentions that she frequently forgets to pick up her medications from the pharmacy, and has not taken metoprolol  succinate since it was first filled ~ 2 months ago. After  visit, called her pharmacy to ask them to fill metoprolol  succinate and they said they would start it so she could get on the way home. Also, Entresto  was last filled 12/13/23 before called in today, indicating she continues to miss doses due to her memory problems.She does use a pillbox and sets alarms but still struggles with compliance.  HF  Medications: Metoprolol  succinate 25 mg daily. Had not picked up refill since 12/10/2023 Entresto  24/26 mg BID Farxiga  10 mg daily Furosemide  20 mg PRN   Has the patient been experiencing any side effects to the medications prescribed?  No.   Does the patient have any problems obtaining medications due to transportation or finances?   No problems affording medications, patient has [place] [employer]. She often forgets to pick up refills, poor compliance.   Understanding of regimen: poor Understanding of indications: poor Potential of compliance: poor Patient understands to avoid NSAIDs. Patient understands to avoid decongestants.    Pertinent Lab Values: 12/24/2023: Serum creatinine 0.87 mg/dL, BUN 11 mg/dL, Potassium 4.0 mmol/L, Sodium 139 mmol/L, BNP 30.2 pg/mL (12/10/23)  Vital Signs: Weight: 94.8 lbs (last clinic weight: 99 lbs) Blood pressure: 130/82 mmHg Heart rate: 113 bpm  Assessment/Plan: 1. Chronic systolic CHF: Nonischemic cardiomyopathy.  Echo in 05/2022 with EF 30-35%, mild RV dysfunction, small-moderate pericardial effusion, probably moderate AI, and dilated IVC. This is surprising given prior echo in 04/2022 showed EF 70-75%, normal RV, and possible severe AI (not well-visualized).  BNP was markedly elevated. LHC/RHC showed mildly elevated filling pressures, preserved cardiac output, and no significant coronary disease.  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 cMRI 05/2022 showed LVEF 38%, RVEF 41%, no LGE, elevated ECV.  Repeat echo 02/09/24, EF  55%.  - NYHA III. Appears euvolemic on exam. - Continue furosemide  20 mg PRN - Restart metoprolol  succinate 25 mg daily. She had not picked up refill since original fill on 12/10/2023. - Continue Entresto  24/26 mg BID. Based off fill history, worry she has not been taking consistently despite her efforts.  - Continue Farxiga  10 mg daily  - Can  consider spironolactone  in the future, though I do worry about compliance with medication and follow up labs.  2. Aortic insufficiency:  Severe on 04/2022 ECHO. TEE  showed mild-moderate AI with trileaflet valve. Most recent echo with only mild AI.   3. Psych: Anxiety, PTSD, bipolar disorder - Suspect this plays a role in her malnutrition.  - She follows with psychiatry.   4. Protein Malnutrition: Body mass index is 16.47 kg/m.  - She says she has a good appetite.  - She is on mirtazapine .  5. Adrenal insufficiency:  - Did not have full workup of this last admission but not on steroids and probably not adrenally insufficient.  6. Prior heavy ETOH  7. Prior IVDU  Follow up with APP in 2 months.   Tinnie Redman, PharmD, BCPS, BCCP, CPP Heart Failure Clinic Pharmacist [phone]

## 2024-02-08 NOTE — Addendum Note (Signed)
 Encounter addended by: Serena Morna SAUNDERS, [facility] on: 02/08/2024 3:09 PM  Actions taken: Delete clinical note

## 2024-02-09 ENCOUNTER — Other Ambulatory Visit: Payer: Self-pay | Admitting: Nurse Practitioner

## 2024-02-09 ENCOUNTER — Ambulatory Visit (HOSPITAL_COMMUNITY)
Admission: RE | Admit: 2024-02-09 | Discharge: 2024-02-09 | Disposition: A | Discharge status: Home / Self Care | Source: Ambulatory Visit | Attending: Family Medicine | Admitting: Family Medicine

## 2024-02-09 DIAGNOSIS — I5022 Chronic systolic (congestive) heart failure: Secondary | ICD-10-CM

## 2024-02-09 DIAGNOSIS — I5021 Acute systolic (congestive) heart failure: Secondary | ICD-10-CM | POA: Diagnosis not present

## 2024-02-09 DIAGNOSIS — S161XXA Strain of muscle, fascia and tendon at neck level, initial encounter: Secondary | ICD-10-CM

## 2024-02-09 LAB — ECHOCARDIOGRAM COMPLETE
Area-P 1/2: 4.35 cm2
P 1/2 time: 305 ms
S' Lateral: 2.5 cm

## 2024-02-09 NOTE — Telephone Encounter (Signed)
 Please advise [facility]

## 2024-02-09 NOTE — Progress Notes (Signed)
  Echocardiogram 2D Echocardiogram has been performed.  Jamie Shields 02/09/2024, 3:40 PM

## 2024-02-15 ENCOUNTER — Ambulatory Visit (HOSPITAL_COMMUNITY)
Admission: RE | Admit: 2024-02-15 | Discharge: 2024-02-15 | Disposition: A | Discharge status: Home / Self Care | Source: Ambulatory Visit | Attending: Cardiology

## 2024-02-15 VITALS — BP 130/82 | HR 113 | Wt 94.8 lb

## 2024-02-15 DIAGNOSIS — Z91130 Patient's unintentional underdosing of medication regimen due to age-related debility: Secondary | ICD-10-CM | POA: Diagnosis not present

## 2024-02-15 DIAGNOSIS — F419 Anxiety disorder, unspecified: Secondary | ICD-10-CM | POA: Diagnosis not present

## 2024-02-15 DIAGNOSIS — E278 Other specified disorders of adrenal gland: Secondary | ICD-10-CM | POA: Diagnosis not present

## 2024-02-15 DIAGNOSIS — F4310 Post-traumatic stress disorder, unspecified: Secondary | ICD-10-CM | POA: Insufficient documentation

## 2024-02-15 DIAGNOSIS — E46 Unspecified protein-calorie malnutrition: Secondary | ICD-10-CM | POA: Insufficient documentation

## 2024-02-15 DIAGNOSIS — F1991 Other psychoactive substance use, unspecified, in remission: Secondary | ICD-10-CM | POA: Diagnosis not present

## 2024-02-15 DIAGNOSIS — I351 Nonrheumatic aortic (valve) insufficiency: Secondary | ICD-10-CM | POA: Insufficient documentation

## 2024-02-15 DIAGNOSIS — I5022 Chronic systolic (congestive) heart failure: Secondary | ICD-10-CM | POA: Diagnosis not present

## 2024-02-15 DIAGNOSIS — F1091 Alcohol use, unspecified, in remission: Secondary | ICD-10-CM | POA: Diagnosis not present

## 2024-02-15 DIAGNOSIS — Z681 Body mass index (BMI) 19 or less, adult: Secondary | ICD-10-CM | POA: Insufficient documentation

## 2024-02-15 DIAGNOSIS — I428 Other cardiomyopathies: Secondary | ICD-10-CM | POA: Insufficient documentation

## 2024-02-15 DIAGNOSIS — Z79899 Other long term (current) drug therapy: Secondary | ICD-10-CM | POA: Diagnosis not present

## 2024-02-15 DIAGNOSIS — T447X6A Underdosing of beta-adrenoreceptor antagonists, initial encounter: Secondary | ICD-10-CM | POA: Diagnosis not present

## 2024-02-15 DIAGNOSIS — F319 Bipolar disorder, unspecified: Secondary | ICD-10-CM | POA: Diagnosis not present

## 2024-02-15 DIAGNOSIS — T465X6A Underdosing of other antihypertensive drugs, initial encounter: Secondary | ICD-10-CM | POA: Insufficient documentation

## 2024-02-15 NOTE — Patient Instructions (Signed)
 It was a pleasure seeing you today!  MEDICATIONS: -Restart metoprolol  succinate 25 mg daily -Call if you have questions about your medications.   NEXT APPOINTMENT: Return to clinic in 2 months with [facility].  In general, to take care of your heart failure: -Limit your fluid intake to 2 Liters (half-gallon) per day.   -Limit your salt intake to ideally 2-3 grams (2000-3000 mg) per day. -Weigh yourself daily and record, and bring that weight diary to your next appointment.  (Weight gain of 2-3 pounds in 1 day typically means fluid weight.) -The medications for your heart are to help your heart and help you live longer.   -Please contact us  before stopping any of your heart medications.  Call the clinic at [phone] with questions or to reschedule future appointments.

## 2024-02-23 ENCOUNTER — Other Ambulatory Visit: Payer: Self-pay | Admitting: Nurse Practitioner

## 2024-02-23 DIAGNOSIS — S161XXA Strain of muscle, fascia and tendon at neck level, initial encounter: Secondary | ICD-10-CM

## 2024-03-03 ENCOUNTER — Other Ambulatory Visit: Payer: Self-pay | Admitting: Nurse Practitioner

## 2024-03-03 MED ORDER — DOXYCYCLINE HYCLATE 100 MG PO TABS: 100.0000 mg | ORAL_TABLET | Freq: Two times a day (BID) | ORAL | 0 refills | Status: AC

## 2024-03-06 ENCOUNTER — Other Ambulatory Visit (HOSPITAL_COMMUNITY): Payer: Self-pay | Admitting: Physician Assistant

## 2024-03-06 DIAGNOSIS — F411 Generalized anxiety disorder: Secondary | ICD-10-CM

## 2024-03-07 ENCOUNTER — Ambulatory Visit (INDEPENDENT_AMBULATORY_CARE_PROVIDER_SITE_OTHER): Admitting: Nurse Practitioner

## 2024-03-07 ENCOUNTER — Encounter: Payer: Self-pay | Admitting: Nurse Practitioner

## 2024-03-07 VITALS — BP 110/66 | HR 89 | Temp 98.2°F | Wt 97.4 lb

## 2024-03-07 DIAGNOSIS — M2550 Pain in unspecified joint: Secondary | ICD-10-CM | POA: Diagnosis not present

## 2024-03-07 DIAGNOSIS — M79671 Pain in right foot: Secondary | ICD-10-CM

## 2024-03-07 DIAGNOSIS — Z419 Encounter for procedure for purposes other than remedying health state, unspecified: Secondary | ICD-10-CM | POA: Diagnosis not present

## 2024-03-07 DIAGNOSIS — L02619 Cutaneous abscess of unspecified foot: Secondary | ICD-10-CM

## 2024-03-07 MED ORDER — ONDANSETRON 4 MG PO TBDP: 4.0000 mg | ORAL_TABLET | Freq: Three times a day (TID) | ORAL | 0 refills | Status: DC | PRN

## 2024-03-07 MED ORDER — MUPIROCIN 2 % EX OINT: 1.0000 | TOPICAL_OINTMENT | Freq: Two times a day (BID) | CUTANEOUS | 0 refills | Status: DC

## 2024-03-07 NOTE — Progress Notes (Signed)
 Subjective   Patient ID: Jamie Shields, female    DOB: Oct 17, 1980, 43 y.o.   MRN: [id number]  Chief Complaint  Patient presents with   Medical Management of Chronic Issues   Foot Swelling    Right foot swelling     Referring provider: Oley Bascom RAMAN, NP  Jamie Shields is a 43 y.o. female with Past Medical History: No date: Anxiety No date: Bipolar 1 disorder (HCC) No date: Bursitis of hip No date: Chronic bronchitis No date: Hip joint pain No date: Seizures (HCC) No date: Shock [facility])     Comment:  hypovolemic   HPI  Patient presents today for follow-up on cellulitis to right foot.  She has been taking antibiotics as directed and her foot does look much improved.  She does still have some redness and tenderness to the right foot and toenail fungus.  We will place referral to podiatry for further evaluation and treatment.  Patient would like labs today she states that she feels fatigued and does have multiple joint pain.  We will order labs including autoimmune panel. Denies f/c/s, n/v/d, hemoptysis, PND, leg swelling Denies chest pain or edema      Allergies  Allergen Reactions   Cucumber Extract Anaphylaxis   Keflex  [Cephalexin ] Nausea And Vomiting   Macrobid  [Nitrofurantoin  Monohydrate Macrocrystals] Hives and Swelling   Naproxen  Nausea And Vomiting   Penicillins Nausea And Vomiting    Heavy vomitting Did PCN reaction causing immediate rash, facial/tongue/throat swelling, SOB or lightheadedness with hypotension: No swelling, but im not sure if i got a rash as i was red all over already Did PCN reaction causing severe rash involving mucus membranes or skin necrosis: No Did a PCN reaction that required hospitalization Yes went to the ED Has patient had a PCN reaction occurring within the last 10 years: No-childhood allergy If all of the above answers are NO, then may proceed with   Tramadol  Nausea And Vomiting   Watermelon Concentrate Diarrhea   Darvocet  [Propoxyphene N-Acetaminophen ] Nausea And Vomiting and Other (See Comments)    migraines   Flagyl  [Metronidazole  Hcl] Hives, Swelling and Rash   Ketorolac  Nausea And Vomiting and Other (See Comments)    migraines   Ultram  [Tramadol  Hcl] Nausea And Vomiting and Other (See Comments)    migraines   Vicodin [Hydrocodone -Acetaminophen ] Nausea And Vomiting and Other (See Comments)    migraines     There is no immunization history on file for this patient.  Tobacco History: Social History   Tobacco Use  Smoking Status Former   Current packs/day: 0.50   Types: Cigarettes, E-cigarettes   Start date: 2018  Smokeless Tobacco Never  Tobacco Comments   Former smoker 07/03/22   Counseling given: Not Answered Tobacco comments: Former smoker 07/03/22   Outpatient Encounter Medications as of 03/07/2024  Medication Sig   busPIRone  (BUSPAR ) 15 MG tablet Take 30 mg by mouth 2 (two) times daily.   cyclobenzaprine  (FLEXERIL ) 10 MG tablet TAKE 1 TABLET BY MOUTH 3 TIMES DAILY AS NEEDED FOR MUSCLE SPASMS.   dapagliflozin  propanediol (FARXIGA ) 10 MG TABS tablet Take 1 tablet (10 mg total) by mouth daily before breakfast.   doxycycline  (VIBRA -TABS) 100 MG tablet Take 1 tablet (100 mg total) by mouth 2 (two) times daily for 10 days.   furosemide  (LASIX ) 20 MG tablet TAKE 1 TABLET (20 MG) BY MOUTH DAILY   levETIRAcetam  (KEPPRA ) 500 MG tablet TAKE 1 TABLET BY MOUTH TWICE A DAY   metoprolol  succinate (  TOPROL  XL) 25 MG 24 hr tablet Take 1 tablet (25 mg total) by mouth daily.   mirtazapine  (REMERON ) 45 MG tablet Take 1 tablet (45 mg total) by mouth at bedtime.   mupirocin  ointment (BACTROBAN ) 2 % Apply 1 Application topically 2 (two) times daily.   ondansetron  (ZOFRAN -ODT) 4 MG disintegrating tablet Take 1 tablet (4 mg total) by mouth every 8 (eight) hours as needed.   sacubitril -valsartan  (ENTRESTO ) 24-26 MG Take 1 tablet by mouth 2 (two) times daily.   No facility-administered encounter medications on  file as of 03/07/2024.    Review of Systems  Review of Systems  Constitutional:  Positive for fatigue.  HENT: Negative.    Cardiovascular: Negative.   Gastrointestinal: Negative.   Allergic/Immunologic: Negative.   Neurological: Negative.   Psychiatric/Behavioral: Negative.       Objective:   BP 110/66   Pulse 89   Temp 98.2 F (36.8 C) (Oral)   Wt 97 lb 6.4 oz (44.2 kg)   SpO2 100%   BMI 16.21 kg/m   Wt Readings from Last 5 Encounters:  03/07/24 97 lb 6.4 oz (44.2 kg)  02/15/24 94 lb 12.8 oz (43 kg)  12/10/23 99 lb (44.9 kg)  12/10/23 92 lb 12.8 oz (42.1 kg)  10/06/23 92 lb 11.2 oz (42 kg)     Physical Exam Vitals and nursing note reviewed.  Constitutional:      General: She is not in acute distress.    Appearance: She is well-developed.  Cardiovascular:     Rate and Rhythm: Normal rate and regular rhythm.  Pulmonary:     Effort: Pulmonary effort is normal.     Breath sounds: Normal breath sounds.  Musculoskeletal:       Feet:  Feet:     Right foot:     Skin integrity: Skin breakdown, erythema and warmth present.     Toenail Condition: Fungal disease present.    Left foot:     Toenail Condition: Fungal disease present. Neurological:     Mental Status: She is alert and oriented to person, place, and time.       Assessment & Plan:   Right foot pain -     Ambulatory referral to Podiatry  Abscess of foot -     Mupirocin ; Apply 1 Application topically 2 (two) times daily.  Dispense: 22 g; Refill: 0  Multiple joint pain -     CBC; Future -     Comprehensive metabolic panel with GFR; Future -     TSH; Future -     ANA, IFA Comprehensive Panel; Future -     Rheumatoid factor; Future -     Sedimentation rate; Future  Other orders -     Ondansetron ; Take 1 tablet (4 mg total) by mouth every 8 (eight) hours as needed.  Dispense: 20 tablet; Refill: 0     Return if symptoms worsen or fail to improve.   Bascom GORMAN Borer, NP 03/07/2024

## 2024-03-10 ENCOUNTER — Ambulatory Visit: Admitting: Podiatry

## 2024-03-10 ENCOUNTER — Other Ambulatory Visit: Payer: Self-pay

## 2024-03-10 ENCOUNTER — Ambulatory Visit (INDEPENDENT_AMBULATORY_CARE_PROVIDER_SITE_OTHER)

## 2024-03-10 DIAGNOSIS — M216X1 Other acquired deformities of right foot: Secondary | ICD-10-CM | POA: Insufficient documentation

## 2024-03-10 DIAGNOSIS — L03031 Cellulitis of right toe: Secondary | ICD-10-CM | POA: Diagnosis not present

## 2024-03-10 DIAGNOSIS — M2550 Pain in unspecified joint: Secondary | ICD-10-CM

## 2024-03-10 NOTE — Addendum Note (Signed)
 Addended by: STARLENE PANCOAST D on: 03/10/2024 12:02 PM   Modules accepted: Orders

## 2024-03-10 NOTE — Progress Notes (Signed)
 Subjective:   Patient ID: Jamie Shields, female   DOB: 43 y.o.   MRN: [id number]   HPI Patient presents stating that there is pain in the 3rd-5th toe on the right foot with some swelling and redness and that she started an antibiotic is doing better but the doctor still wanted her to be checked.  Patient is not currently smoking and likes to be active if possible   Review of Systems  All other systems reviewed and are negative.       Objective:  Physical Exam Vitals and nursing note reviewed.  Constitutional:      Appearance: She is well-developed.  Pulmonary:     Effort: Pulmonary effort is normal.  Musculoskeletal:        General: Normal range of motion.  Skin:    General: Skin is warm.  Neurological:     Mental Status: She is alert.     Neurovascular status intact muscle strength adequate range of motion adequate with patient noted to have a area between the 4th and 5th toes right where there is some irritated tissue noted but there is no active drainage currently and no other pathology noted.  Patient has good digital perfusion well-oriented x 3     Assessment:  Mobility for area of irritation between the 2 toes which may have broken down and created a infection of the forefoot right     Plan:  H&P reviewed she is doing much better has no systemic signs of infection no other pathology noted and does have keratotic tissue that I want her to keep clean and if it were to persist organ to need to consider arthroplasty with partial soft tissue syndactylization.  If any redness or swelling would occur she is to reappoint immediately  X-rays indicate that there is enlargement had a proximal phalanx fifth digit that may need to be corrected at 1 point signed visit signed visit

## 2024-03-11 LAB — COMPREHENSIVE METABOLIC PANEL WITH GFR
ALT: 25 IU/L (ref 0–32)
AST: 30 IU/L (ref 0–40)
Albumin: 4.7 g/dL (ref 3.9–4.9)
Alkaline Phosphatase: 110 IU/L (ref 44–121)
BUN/Creatinine Ratio: 14 (ref 9–23)
BUN: 14 mg/dL (ref 6–24)
Bilirubin Total: 0.5 mg/dL (ref 0.0–1.2)
CO2: 18 mmol/L — ABNORMAL LOW (ref 20–29)
Calcium: 10 mg/dL (ref 8.7–10.2)
Chloride: 102 mmol/L (ref 96–106)
Creatinine, Ser: 1.02 mg/dL — ABNORMAL HIGH (ref 0.57–1.00)
Globulin, Total: 2.4 g/dL (ref 1.5–4.5)
Glucose: 107 mg/dL — ABNORMAL HIGH (ref 70–99)
Potassium: 4.2 mmol/L (ref 3.5–5.2)
Sodium: 136 mmol/L (ref 134–144)
Total Protein: 7.1 g/dL (ref 6.0–8.5)
eGFR: 70 mL/min/1.73 (ref >59)

## 2024-03-11 LAB — CBC
Hematocrit: 41.4 % (ref 34.0–46.6)
Hemoglobin: 13.3 g/dL (ref 11.1–15.9)
MCH: 30.9 pg (ref 26.6–33.0)
MCHC: 32.1 g/dL (ref 31.5–35.7)
MCV: 96 fL (ref 79–97)
Platelets: 406 x10E3/uL (ref 150–450)
RBC: 4.31 x10E6/uL (ref 3.77–5.28)
RDW: 11.7 % (ref 11.7–15.4)
WBC: 6.6 x10E3/uL (ref 3.4–10.8)

## 2024-03-11 LAB — SEDIMENTATION RATE: Sed Rate: 5 mm/h (ref 0–32)

## 2024-03-11 LAB — RHEUMATOID FACTOR: Rheumatoid fact SerPl-aCnc: 11.4 [IU]/mL (ref <14.0)

## 2024-03-11 LAB — TSH: TSH: 0.477 u[IU]/mL (ref 0.450–4.500)

## 2024-03-13 ENCOUNTER — Ambulatory Visit: Payer: Self-pay | Admitting: Nurse Practitioner

## 2024-03-13 DIAGNOSIS — R768 Other specified abnormal immunological findings in serum: Secondary | ICD-10-CM

## 2024-03-15 ENCOUNTER — Other Ambulatory Visit: Payer: Self-pay | Admitting: Nurse Practitioner

## 2024-03-16 LAB — FANA STAINING PATTERNS: Speckled Pattern: 1:80 {titer}

## 2024-03-16 LAB — ANA,IFA RA DIAG PNL W/RFLX TIT/PATN
ANA Titer 1: POSITIVE — AB
Cyclic Citrullin Peptide Ab: 6 U (ref 0–19)
Rheumatoid fact SerPl-aCnc: 11.2 [IU]/mL (ref <14.0)

## 2024-03-17 ENCOUNTER — Other Ambulatory Visit: Payer: Self-pay | Admitting: Nurse Practitioner

## 2024-03-17 DIAGNOSIS — S161XXA Strain of muscle, fascia and tendon at neck level, initial encounter: Secondary | ICD-10-CM

## 2024-04-03 ENCOUNTER — Encounter: Payer: Self-pay | Admitting: Podiatry

## 2024-04-04 ENCOUNTER — Ambulatory Visit

## 2024-04-05 ENCOUNTER — Ambulatory Visit: Admitting: Podiatry

## 2024-04-05 DIAGNOSIS — L0889 Other specified local infections of the skin and subcutaneous tissue: Secondary | ICD-10-CM | POA: Diagnosis not present

## 2024-04-05 MED ORDER — DOXYCYCLINE HYCLATE 100 MG PO TABS: 100.0000 mg | ORAL_TABLET | Freq: Two times a day (BID) | ORAL | 0 refills | Status: AC

## 2024-04-05 NOTE — Progress Notes (Signed)
 Subjective:  Patient ID: Jamie Shields, female    DOB: [birth year],  MRN: [id number]  Chief Complaint  Patient presents with   Foot Pain    Pt stated that she has this place on the top of her foot that has been having some drainage coming from it     43 y.o. female presents with the above complaint.  Patient presents with right fourth interspace infection.  She states that she may have gotten stuck with the wood when she was outside.  She states it hurts on top of her foot there is some drainage she is not a diabetic wanted to get it evaluated pain scale is 5 out of 10 dull aching nature has not seen anyone else prior to seeing me for this infection.   Review of Systems: Negative except as noted in the HPI. Denies N/V/F/Ch.  Past Medical History:  Diagnosis Date   Anxiety    Bipolar 1 disorder (HCC)    Bursitis of hip    Chronic bronchitis    Hip joint pain    Seizures (HCC)    Shock (HCC)    hypovolemic    Current Outpatient Medications:    doxycycline  (VIBRA -TABS) 100 MG tablet, Take 1 tablet (100 mg total) by mouth 2 (two) times daily., Disp: 28 tablet, Rfl: 0   cyclobenzaprine  (FLEXERIL ) 10 MG tablet, TAKE 1 TABLET BY MOUTH 3 TIMES DAILY AS NEEDED FOR MUSCLE SPASMS., Disp: 30 tablet, Rfl: 0   dapagliflozin  propanediol (FARXIGA ) 10 MG TABS tablet, Take 1 tablet (10 mg total) by mouth daily before breakfast., Disp: 30 tablet, Rfl: 11   furosemide  (LASIX ) 20 MG tablet, TAKE 1 TABLET (20 MG) BY MOUTH DAILY, Disp: 30 tablet, Rfl: 1   levETIRAcetam  (KEPPRA ) 500 MG tablet, TAKE 1 TABLET BY MOUTH TWICE A DAY, Disp: 60 tablet, Rfl: 1   metoprolol  succinate (TOPROL  XL) 25 MG 24 hr tablet, Take 1 tablet (25 mg total) by mouth daily., Disp: 30 tablet, Rfl: 11   mupirocin  ointment (BACTROBAN ) 2 %, Apply 1 Application topically 2 (two) times daily., Disp: 22 g, Rfl: 0   ondansetron  (ZOFRAN -ODT) 4 MG disintegrating tablet, Take 1 tablet (4 mg total) by mouth every 8 (eight) hours as needed.,  Disp: 20 tablet, Rfl: 0   sacubitril -valsartan  (ENTRESTO ) 24-26 MG, Take 1 tablet by mouth 2 (two) times daily., Disp: 60 tablet, Rfl: 11  Social History   Tobacco Use  Smoking Status Former   Current packs/day: 0.50   Types: Cigarettes, E-cigarettes   Start date: 2018  Smokeless Tobacco Never  Tobacco Comments   Former smoker 07/03/22    Allergies  Allergen Reactions   Cucumber Extract Anaphylaxis   Keflex  [Cephalexin ] Nausea And Vomiting   Macrobid  [Nitrofurantoin  Monohydrate Macrocrystals] Hives and Swelling   Naproxen  Nausea And Vomiting   Penicillins Nausea And Vomiting    Heavy vomitting Did PCN reaction causing immediate rash, facial/tongue/throat swelling, SOB or lightheadedness with hypotension: No swelling, but im not sure if i got a rash as i was red all over already Did PCN reaction causing severe rash involving mucus membranes or skin necrosis: No Did a PCN reaction that required hospitalization Yes went to the ED Has patient had a PCN reaction occurring within the last 10 years: No-childhood allergy If all of the above answers are NO, then may proceed with   Tramadol  Nausea And Vomiting   Watermelon Concentrate Diarrhea   Darvocet [Propoxyphene N-Acetaminophen ] Nausea And Vomiting and Other (See Comments)  migraines   Flagyl  [Metronidazole  Hcl] Hives, Swelling and Rash   Ketorolac  Nausea And Vomiting and Other (See Comments)    migraines   Ultram  [Tramadol  Hcl] Nausea And Vomiting and Other (See Comments)    migraines   Vicodin [Hydrocodone -Acetaminophen ] Nausea And Vomiting and Other (See Comments)    migraines   Objective:  There were no vitals filed for this visit. There is no height or weight on file to calculate BMI. Constitutional Well developed. Well nourished.  Vascular Dorsalis pedis pulses palpable bilaterally. Posterior tibial pulses palpable bilaterally. Capillary refill normal to all digits.  No cyanosis or clubbing noted. Pedal hair  growth normal.  Neurologic Normal speech. Oriented to person, place, and time. Epicritic sensation to light touch grossly present bilaterally.  Dermatologic Right fourth interspace erythema noted.  No cellulitis noted no purulent drainage noted some fluid noted serosanguineous in nature.  No malodor present.  No concern for deeper abscess noted at this time  Orthopedic: Normal joint ROM without pain or crepitus bilaterally. No visible deformities. No bony tenderness.   Radiographs: None Assessment:   1. Other specified local infections of the skin and subcutaneous tissue    Plan:  Patient was evaluated and treated and all questions answered.  Right fourth interspace local skin infection - All questions and concerns were discussed with the patient extensive due to given the amount of redness and infection present should benefit from doxycycline  doxycycline  was sent to the pharmacy.  Her tetanus shot is updated per patient. - I discussed with her in extensive detail if the redness gets worse she will go to the emergency room to get IV antibiotics she states understanding.  No follow-ups on file.  Right fourth interspace redness and infection she may have gotten stuch with wood  doxy

## 2024-04-07 ENCOUNTER — Ambulatory Visit (INDEPENDENT_AMBULATORY_CARE_PROVIDER_SITE_OTHER): Payer: Self-pay | Admitting: Nurse Practitioner

## 2024-04-07 ENCOUNTER — Encounter: Payer: Self-pay | Admitting: Nurse Practitioner

## 2024-04-07 DIAGNOSIS — L02619 Cutaneous abscess of unspecified foot: Secondary | ICD-10-CM

## 2024-04-07 DIAGNOSIS — Z419 Encounter for procedure for purposes other than remedying health state, unspecified: Secondary | ICD-10-CM | POA: Diagnosis not present

## 2024-04-07 MED ORDER — MUPIROCIN 2 % EX OINT: 1.0000 | TOPICAL_OINTMENT | Freq: Two times a day (BID) | CUTANEOUS | 0 refills | Status: AC

## 2024-04-07 NOTE — Progress Notes (Signed)
 Subjective   Patient ID: Jamie Shields, female    DOB: 1980-11-22, 43 y.o.   MRN: [id number]  Chief Complaint  Patient presents with   Medical Management of Chronic Issues   Foot Problem    Right foot infected, taking antibiotics and wearing a boot    Referring provider: Oley Bascom RAMAN, NP  Melonie L Peth is a 43 y.o. female with Past Medical History: No date: Anxiety No date: Bipolar 1 disorder (HCC) No date: Bursitis of hip No date: Chronic bronchitis No date: Hip joint pain No date: Seizures (HCC) No date: Shock [facility])     Comment:  hypovolemic   HPI  Patient presents today for right foot infection.  She does have an open area below her fifth toe on her right foot.  Has been started on doxycycline . Has seen podiatry yesterday. Started on doxycycline  yesterday. Area has been draining.  She states that is starting to improve.  We will order Bactroban  ointment. Denies f/c/s, n/v/d, hemoptysis, PND, leg swelling Denies chest pain or edema     Allergies  Allergen Reactions   Cucumber Extract Anaphylaxis   Keflex  [Cephalexin ] Nausea And Vomiting   Macrobid  [Nitrofurantoin  Monohydrate Macrocrystals] Hives and Swelling   Naproxen  Nausea And Vomiting   Penicillins Nausea And Vomiting    Heavy vomitting Did PCN reaction causing immediate rash, facial/tongue/throat swelling, SOB or lightheadedness with hypotension: No swelling, but im not sure if i got a rash as i was red all over already Did PCN reaction causing severe rash involving mucus membranes or skin necrosis: No Did a PCN reaction that required hospitalization Yes went to the ED Has patient had a PCN reaction occurring within the last 10 years: No-childhood allergy If all of the above answers are NO, then may proceed with   Tramadol  Nausea And Vomiting   Watermelon Concentrate Diarrhea   Darvocet [Propoxyphene N-Acetaminophen ] Nausea And Vomiting and Other (See Comments)    migraines   Flagyl  [Metronidazole   Hcl] Hives, Swelling and Rash   Ketorolac  Nausea And Vomiting and Other (See Comments)    migraines   Ultram  [Tramadol  Hcl] Nausea And Vomiting and Other (See Comments)    migraines   Vicodin [Hydrocodone -Acetaminophen ] Nausea And Vomiting and Other (See Comments)    migraines     There is no immunization history on file for this patient.  Tobacco History: Social History   Tobacco Use  Smoking Status Former   Current packs/day: 0.50   Types: Cigarettes, E-cigarettes   Start date: 2018  Smokeless Tobacco Never  Tobacco Comments   Former smoker 07/03/22   Counseling given: Not Answered Tobacco comments: Former smoker 07/03/22   Outpatient Encounter Medications as of 04/07/2024  Medication Sig   cyclobenzaprine  (FLEXERIL ) 10 MG tablet TAKE 1 TABLET BY MOUTH 3 TIMES DAILY AS NEEDED FOR MUSCLE SPASMS.   dapagliflozin  propanediol (FARXIGA ) 10 MG TABS tablet Take 1 tablet (10 mg total) by mouth daily before breakfast.   doxycycline  (VIBRA -TABS) 100 MG tablet Take 1 tablet (100 mg total) by mouth 2 (two) times daily.   furosemide  (LASIX ) 20 MG tablet TAKE 1 TABLET (20 MG) BY MOUTH DAILY   levETIRAcetam  (KEPPRA ) 500 MG tablet TAKE 1 TABLET BY MOUTH TWICE A DAY   metoprolol  succinate (TOPROL  XL) 25 MG 24 hr tablet Take 1 tablet (25 mg total) by mouth daily.   ondansetron  (ZOFRAN -ODT) 4 MG disintegrating tablet Take 1 tablet (4 mg total) by mouth every 8 (eight) hours as needed.  sacubitril -valsartan  (ENTRESTO ) 24-26 MG Take 1 tablet by mouth 2 (two) times daily.   [DISCONTINUED] busPIRone  (BUSPAR ) 15 MG tablet Take 30 mg by mouth 2 (two) times daily.   [DISCONTINUED] mirtazapine  (REMERON ) 45 MG tablet Take 1 tablet (45 mg total) by mouth at bedtime.   [DISCONTINUED] mupirocin  ointment (BACTROBAN ) 2 % Apply 1 Application topically 2 (two) times daily.   mupirocin  ointment (BACTROBAN ) 2 % Apply 1 Application topically 2 (two) times daily.   No facility-administered encounter  medications on file as of 04/07/2024.    Review of Systems  Review of Systems  Constitutional: Negative.   HENT: Negative.    Cardiovascular: Negative.   Gastrointestinal: Negative.   Allergic/Immunologic: Negative.   Neurological: Negative.   Psychiatric/Behavioral: Negative.       Objective:   BP (!) 111/7   Pulse (!) 125   Ht 5' 5 (1.651 m)   Wt 99 lb (44.9 kg)   BMI 16.47 kg/m   Wt Readings from Last 5 Encounters:  04/07/24 99 lb (44.9 kg)  03/07/24 97 lb 6.4 oz (44.2 kg)  02/15/24 94 lb 12.8 oz (43 kg)  12/10/23 99 lb (44.9 kg)  12/10/23 92 lb 12.8 oz (42.1 kg)     Physical Exam Vitals and nursing note reviewed.  Constitutional:      General: She is not in acute distress.    Appearance: She is well-developed.  Cardiovascular:     Rate and Rhythm: Normal rate and regular rhythm.  Pulmonary:     Effort: Pulmonary effort is normal.     Breath sounds: Normal breath sounds.  Musculoskeletal:       Feet:  Feet:     Right foot:     Skin integrity: Skin breakdown and erythema present.  Neurological:     Mental Status: She is alert and oriented to person, place, and time.       Assessment & Plan:   Abscess of foot -     Mupirocin ; Apply 1 Application topically 2 (two) times daily.  Dispense: 22 g; Refill: 0     Return if symptoms worsen or fail to improve.   Bascom GORMAN Borer, NP 04/07/2024

## 2024-04-12 ENCOUNTER — Ambulatory Visit: Admitting: Podiatry

## 2024-04-12 ENCOUNTER — Encounter: Payer: Self-pay | Admitting: Podiatry

## 2024-04-12 DIAGNOSIS — L0889 Other specified local infections of the skin and subcutaneous tissue: Secondary | ICD-10-CM | POA: Diagnosis not present

## 2024-04-12 NOTE — Progress Notes (Signed)
 Subjective:   Patient ID: Jamie Shields, female   DOB: 43 y.o.   MRN: [id number]   HPI Patient states doing much better states that it seems that the wound has healed   ROS      Objective:  Physical Exam  Neurovascular status intact with wound to the fourth interspace right which is improved with reduced erythema edema and no current drainage with patient wearing surgical shoe     Assessment:  Cellulitic event right which appears to be under control     Plan:  Reviewed condition and patient may return to soft shoe gear along with bandage usage and gave strict instructions on what to do if erythema edema or drainage were to recur which is possible.  Patient understands all this will be seen back as needed

## 2024-04-14 ENCOUNTER — Telehealth (HOSPITAL_COMMUNITY): Payer: Self-pay

## 2024-04-14 ENCOUNTER — Other Ambulatory Visit: Payer: Self-pay | Admitting: Nurse Practitioner

## 2024-04-14 DIAGNOSIS — S161XXA Strain of muscle, fascia and tendon at neck level, initial encounter: Secondary | ICD-10-CM

## 2024-04-14 NOTE — Telephone Encounter (Signed)
 Called to confirm/remind patient of [facility] on 04/17/[facility].   Appointment:   [x] Confirmed  [] Left mess   [] No answer/No voice mail  [] VM Full/unable to leave message  [] Phone not in service  Patient reminded to bring all medications and/or complete list.  Confirmed patient has transportation. Gave directions, instructed to utilize valet parking.

## 2024-04-17 ENCOUNTER — Encounter (HOSPITAL_COMMUNITY)

## 2024-04-29 ENCOUNTER — Other Ambulatory Visit: Payer: Self-pay | Admitting: Nurse Practitioner

## 2024-04-29 DIAGNOSIS — S161XXA Strain of muscle, fascia and tendon at neck level, initial encounter: Secondary | ICD-10-CM

## 2024-05-01 NOTE — Telephone Encounter (Signed)
 Please advise [facility]

## 2024-05-07 DIAGNOSIS — Z419 Encounter for procedure for purposes other than remedying health state, unspecified: Secondary | ICD-10-CM | POA: Diagnosis not present

## 2024-05-11 ENCOUNTER — Ambulatory Visit: Payer: Self-pay

## 2024-05-11 ENCOUNTER — Ambulatory Visit
Admission: EM | Admit: 2024-05-11 | Discharge: 2024-05-11 | Disposition: A | Discharge status: Home / Self Care | Attending: Family Medicine | Admitting: Family Medicine

## 2024-05-11 DIAGNOSIS — R569 Unspecified convulsions: Secondary | ICD-10-CM | POA: Diagnosis not present

## 2024-05-11 NOTE — Telephone Encounter (Signed)
 FYI Only or Action Required?: FYI only for provider.  Patient was last seen in primary care on 04/07/2024 by Oley Bascom RAMAN, NP.  Called Nurse Triage reporting Altered Mental Status and Seizures.  Symptoms began x 2 days.  Interventions attempted: Nothing.  Symptoms are: altered mental status/confusion, slurred speech gradually worsening.  Triage Disposition: Call EMS 911 Now  Patient/caregiver understands and will follow disposition?: Yes                  Copied from CRM #[id number]. Topic: Clinical - Red Word Triage >> May 11, 2024 11:32 AM Avram MATSU wrote: Red Word that prompted transfer to Nurse Triage: believes she's about to have seizure Reason for Disposition  [1] Difficult to awaken or acting confused (e.g., disoriented, slurred speech) AND [2] present now AND [3] new-onset  Answer Assessment - Initial Assessment Questions Significant other, Medford, on the phone for triage. He states he is with the patient but she stepped outside. He states she has refused to go to the hospital but he can get her in the car and take her to the fire department next door to them. RN advised to call 911 or go to ED, he states he will be able to take her to fire department/rescue.  1. LEVEL OF CONSCIOUSNESS: How are they (the patient) acting right now? (e.g., alert-oriented, confused, lethargic, stuporous, comatose)     Confusion. He had to pick up patient from work. She was looking for someone at work who was not there. He asked the patient to give him her PCP's number and she was looking through the wrong paperwork. She is unable to follow commands properly. He is unsure if she had a seizure in her sleep or if she is about to have a seizure.  2. ONSET: When did the confusion start?  (e.g., minutes, hours, days)     X 2 days, worsened today.  3. PATTERN: Does this come and go, or has it been constant since it started?  Is it present now?     Constant and present now.  4.  ALCOHOL or DRUGS: Have they been drinking alcohol or taking any drugs?      No.  5. NARCOTIC MEDICINES: Have they been receiving any narcotic medications? (e.g., morphine , Vicodin)     Flexeril .  6. CAUSE: What do you think is causing the confusion?      He thinks it could be pre seizure or post seizure activity.  7. OTHER SYMPTOMS: Are there any other symptoms? (e.g., difficulty breathing, fever, headache, weakness)     He states before this started she was talking in her sleep; slurred speech since yesterday evening. Denies unilateral numbness or weakness, chest pain, SOB.  Protocols used: Confusion - Delirium-A-AH

## 2024-05-11 NOTE — Telephone Encounter (Signed)
 Patient's husband Medford calling back in - NT advised to call back after going to fire department. They stated BP was low, systolic 80's, could not remember bottom number. Fire Dept stated not a real emergency. Patient is still experiencing slurred speech and confusion, but getting better. Husband thinks patient had a seizure in her sleep. Patient takes Keppra  500mg  2x daily, denies missing dose. Stated post seizure , patient has been confused for days following a seizure. Advised PCP is not scheduling OV today and patient should be evaluated as soon as possible by either UC or ED.

## 2024-05-11 NOTE — Telephone Encounter (Signed)
 FYI

## 2024-05-11 NOTE — Discharge Instructions (Signed)
 Please reconsider and go to the emergency room for further evaluation

## 2024-05-11 NOTE — ED Provider Notes (Signed)
 Here for possible seizure.  This morning she was sent home from work after she was found to be slurring her speech and speaking incomprehensibly.  She, according to triage note from another facility, had a blood pressure in the 80s at the local fire department.  Her husband brings her in today for further evaluation.  Her blood pressure is improved here at 127/74.  She has a history of epilepsy and takes her Keppra .  Exam is benign here  I have asked her to proceed to the emergency room for further evaluation and she refuses.  I have had her sign an AGAINST MEDICAL ADVICE form.  I discussed with her that the reasons to go are to evaluate on an urgent basis for any sort of cause for a seizure or for her symptoms that she had earlier today.  I have also discussed with her that the risks to not going could include death or disability or further seizures.  She states understanding and signed the form.     Vonna Sharlet POUR, MD 05/11/24 [phone]

## 2024-05-11 NOTE — ED Notes (Signed)
 AMA Form reviewed and signed by patient with provider.

## 2024-05-11 NOTE — ED Triage Notes (Addendum)
 Patient accompanied by significant other. Reports possible seizure episode last night, requiring pickup from work. At approximately 10:00 AM today, the patient was noted to be speaking incoherently, unable to comprehend conversations, and was searching for an employee who was not present. Also reports intermittent episodes of double vision. No reported injury. Significant past medical history includes seizures. History of possible nocturnal seizures over the past few years. Date of last EEG is unknown.   Note Triage call/note from earlier today [facility]):  Aragona, Jamie L, RN    05/11/24 12:24 PM Note Patient's husband Medford calling back in - NT advised to call back after going to fire department. They stated BP was low, systolic 80's, could not remember bottom number. Fire Dept stated not a real emergency. Patient is still experiencing slurred speech and confusion, but getting better. Husband thinks patient had a seizure in her sleep. Patient takes Keppra  500mg  2x daily, denies missing dose. Stated post seizure , patient has been confused for days following a seizure. Advised PCP is not scheduling OV today and patient should be evaluated as soon as possible by either UC or ED.

## 2024-05-19 ENCOUNTER — Other Ambulatory Visit: Payer: Self-pay | Admitting: Nurse Practitioner

## 2024-05-19 DIAGNOSIS — S161XXA Strain of muscle, fascia and tendon at neck level, initial encounter: Secondary | ICD-10-CM

## 2024-05-23 ENCOUNTER — Other Ambulatory Visit: Payer: Self-pay | Admitting: Nurse Practitioner

## 2024-05-23 NOTE — Telephone Encounter (Signed)
 Please advise [facility]

## 2024-06-02 ENCOUNTER — Ambulatory Visit: Admitting: Nurse Practitioner

## 2024-06-07 DIAGNOSIS — Z419 Encounter for procedure for purposes other than remedying health state, unspecified: Secondary | ICD-10-CM | POA: Diagnosis not present

## 2024-06-13 ENCOUNTER — Other Ambulatory Visit: Payer: Self-pay | Admitting: Nurse Practitioner

## 2024-06-13 DIAGNOSIS — S161XXA Strain of muscle, fascia and tendon at neck level, initial encounter: Secondary | ICD-10-CM

## 2024-06-13 NOTE — Telephone Encounter (Signed)
 Please advise [facility]

## 2024-06-15 ENCOUNTER — Other Ambulatory Visit: Payer: Self-pay | Admitting: Medical Genetics

## 2024-06-19 ENCOUNTER — Ambulatory Visit (INDEPENDENT_AMBULATORY_CARE_PROVIDER_SITE_OTHER): Admitting: Nurse Practitioner

## 2024-06-19 ENCOUNTER — Ambulatory Visit (HOSPITAL_COMMUNITY)
Admission: RE | Admit: 2024-06-19 | Discharge: 2024-06-19 | Disposition: A | Discharge status: Home / Self Care | Source: Ambulatory Visit | Attending: Nurse Practitioner | Admitting: Nurse Practitioner

## 2024-06-19 VITALS — BP 117/82 | HR 82 | Wt 102.6 lb

## 2024-06-19 DIAGNOSIS — G629 Polyneuropathy, unspecified: Secondary | ICD-10-CM

## 2024-06-19 DIAGNOSIS — S92151A Displaced avulsion fracture (chip fracture) of right talus, initial encounter for closed fracture: Secondary | ICD-10-CM | POA: Diagnosis not present

## 2024-06-19 DIAGNOSIS — R569 Unspecified convulsions: Secondary | ICD-10-CM

## 2024-06-19 DIAGNOSIS — M79671 Pain in right foot: Secondary | ICD-10-CM | POA: Insufficient documentation

## 2024-06-19 MED ORDER — ONDANSETRON 4 MG PO TBDP: 4.0000 mg | ORAL_TABLET | Freq: Three times a day (TID) | ORAL | 0 refills | Status: AC | PRN

## 2024-06-19 MED ORDER — GABAPENTIN 100 MG PO CAPS: 100.0000 mg | ORAL_CAPSULE | Freq: Three times a day (TID) | ORAL | 3 refills | Status: DC

## 2024-06-19 NOTE — Progress Notes (Signed)
 Subjective   Patient ID: Jamie Shields, female    DOB: 1980/09/21, 43 y.o.   MRN: [id number]  Chief Complaint  Patient presents with   Medication Management   Seizures    05/11/24   Medication Refill    zofran    Foot Injury    Right arm, happened Friday morning, hard to walk on, would like to get xray    Referring provider: Oley Bascom RAMAN, NP  Jamie Shields is a 43 y.o. female with Past Medical History: No date: Anxiety No date: Bipolar 1 disorder (HCC) No date: Bursitis of hip No date: Chronic bronchitis No date: Hip joint pain No date: Seizures (HCC) No date: Shock [facility])     Comment:  hypovolemic   HPI  Patient presents today for a follow-up visit.  She did have a recent seizure.  She does need a referral back to neurology.  Patient is having neuropathy and would like to start on gabapentin .  We will trial this at low-dose.  Patient does complain of a recent right foot injury.  She is requesting an x-ray today. Denies f/c/s, n/v/d, hemoptysis, PND, leg swelling Denies chest pain or edema     Allergies  Allergen Reactions   Cucumber Extract Anaphylaxis   Keflex  [Cephalexin ] Nausea And Vomiting   Macrobid  [Nitrofurantoin  Monohydrate Macrocrystals] Hives and Swelling   Naproxen  Nausea And Vomiting   Penicillins Nausea And Vomiting    Heavy vomitting Did PCN reaction causing immediate rash, facial/tongue/throat swelling, SOB or lightheadedness with hypotension: No swelling, but im not sure if i got a rash as i was red all over already Did PCN reaction causing severe rash involving mucus membranes or skin necrosis: No Did a PCN reaction that required hospitalization Yes went to the ED Has patient had a PCN reaction occurring within the last 10 years: No-childhood allergy If all of the above answers are NO, then may proceed with   Tramadol  Nausea And Vomiting   Watermelon Concentrate Diarrhea   Darvocet [Propoxyphene N-Acetaminophen ] Nausea And Vomiting and  Other (See Comments)    migraines   Flagyl  [Metronidazole  Hcl] Hives, Swelling and Rash   Ketorolac  Nausea And Vomiting and Other (See Comments)    migraines   Ultram  [Tramadol  Hcl] Nausea And Vomiting and Other (See Comments)    migraines   Vicodin [Hydrocodone -Acetaminophen ] Nausea And Vomiting and Other (See Comments)    migraines     There is no immunization history on file for this patient.  Tobacco History: Social History   Tobacco Use  Smoking Status Former   Current packs/day: 0.50   Types: Cigarettes, E-cigarettes   Start date: 2018  Smokeless Tobacco Never  Tobacco Comments   Former smoker 07/03/22   Counseling given: Not Answered Tobacco comments: Former smoker 07/03/22   Outpatient Encounter Medications as of 06/19/2024  Medication Sig   cyclobenzaprine  (FLEXERIL ) 10 MG tablet TAKE 1 TABLET BY MOUTH 3 TIMES DAILY AS NEEDED FOR MUSCLE SPASMS.   dapagliflozin  propanediol (FARXIGA ) 10 MG TABS tablet Take 1 tablet (10 mg total) by mouth daily before breakfast.   doxycycline  (VIBRA -TABS) 100 MG tablet Take 1 tablet (100 mg total) by mouth 2 (two) times daily.   furosemide  (LASIX ) 20 MG tablet TAKE 1 TABLET (20 MG) BY MOUTH DAILY   gabapentin  (NEURONTIN ) 100 MG capsule Take 1 capsule (100 mg total) by mouth 3 (three) times daily.   levETIRAcetam  (KEPPRA ) 500 MG tablet TAKE 1 TABLET BY MOUTH TWICE A DAY   metoprolol   succinate (TOPROL  XL) 25 MG 24 hr tablet Take 1 tablet (25 mg total) by mouth daily.   sacubitril -valsartan  (ENTRESTO ) 24-26 MG Take 1 tablet by mouth 2 (two) times daily.   [DISCONTINUED] ondansetron  (ZOFRAN -ODT) 4 MG disintegrating tablet Take 1 tablet (4 mg total) by mouth every 8 (eight) hours as needed.   mupirocin  ointment (BACTROBAN ) 2 % Apply 1 Application topically 2 (two) times daily. (Patient not taking: Reported on 06/19/2024)   ondansetron  (ZOFRAN -ODT) 4 MG disintegrating tablet Take 1 tablet (4 mg total) by mouth every 8 (eight) hours as  needed.   No facility-administered encounter medications on file as of 06/19/2024.    Review of Systems  Review of Systems  Constitutional: Negative.   HENT: Negative.    Cardiovascular: Negative.   Gastrointestinal: Negative.   Allergic/Immunologic: Negative.   Neurological: Negative.   Psychiatric/Behavioral: Negative.       Objective:   BP 117/82 (BP Location: Left Arm, Patient Position: Sitting, Cuff Size: Normal)   Pulse 82   Wt 102 lb 9.6 oz (46.5 kg)   SpO2 99%   BMI 17.07 kg/m   Wt Readings from Last 5 Encounters:  06/19/24 102 lb 9.6 oz (46.5 kg)  05/11/24 98 lb 15.8 oz (44.9 kg)  04/07/24 99 lb (44.9 kg)  03/07/24 97 lb 6.4 oz (44.2 kg)  02/15/24 94 lb 12.8 oz (43 kg)     Physical Exam Vitals and nursing note reviewed.  Constitutional:      General: She is not in acute distress.    Appearance: She is well-developed.  Cardiovascular:     Rate and Rhythm: Normal rate and regular rhythm.  Pulmonary:     Effort: Pulmonary effort is normal.     Breath sounds: Normal breath sounds.  Neurological:     Mental Status: She is alert and oriented to person, place, and time.       Assessment & Plan:   Neuropathy -     Gabapentin ; Take 1 capsule (100 mg total) by mouth 3 (three) times daily.  Dispense: 90 capsule; Refill: 3 -     Ambulatory referral to Neurology  Right foot pain -     DG Foot Complete Right  Seizures (HCC) -     Ambulatory referral to Neurology  Other orders -     Ondansetron ; Take 1 tablet (4 mg total) by mouth every 8 (eight) hours as needed.  Dispense: 20 tablet; Refill: 0     Return if symptoms worsen or fail to improve.   Bascom GORMAN Borer, NP 06/19/2024

## 2024-06-25 ENCOUNTER — Ambulatory Visit: Payer: Self-pay | Admitting: Nurse Practitioner

## 2024-06-25 DIAGNOSIS — S92919A Unspecified fracture of unspecified toe(s), initial encounter for closed fracture: Secondary | ICD-10-CM

## 2024-07-03 ENCOUNTER — Other Ambulatory Visit: Payer: Self-pay | Admitting: Nurse Practitioner

## 2024-07-03 ENCOUNTER — Ambulatory Visit: Admitting: Orthopedic Surgery

## 2024-07-03 ENCOUNTER — Encounter: Payer: Self-pay | Admitting: Orthopedic Surgery

## 2024-07-03 DIAGNOSIS — S93401A Sprain of unspecified ligament of right ankle, initial encounter: Secondary | ICD-10-CM

## 2024-07-03 MED ORDER — GABAPENTIN 300 MG PO CAPS: 300.0000 mg | ORAL_CAPSULE | Freq: Three times a day (TID) | ORAL | 2 refills | Status: AC

## 2024-07-03 NOTE — Progress Notes (Signed)
 Office Visit Note   Patient: Jamie Shields           Date of Birth: 1980-09-27           MRN: [id number] Visit Date: 07/03/2024              Requested by: Oley Bascom RAMAN, NP [phone] [address] [place],  [place] [address] PCP: Oley Bascom RAMAN, NP  Chief Complaint  Patient presents with   Right Ankle - Pain      HPI: Discussed the use of AI scribe software for clinical note transcription with the patient, who gave verbal consent to proceed.  History of Present Illness Shebra L Cerullo is a 43 year old female who presents with ankle pain following a sprain.  She experiences ankle pain that radiates around the front and up the calf, described as not muscular in nature. There is tenderness on both the deltoid and lateral sides of the ankle.  She recalls kicking something with her leg, which may have contributed to the onset of her symptoms. Recent radiographs from November 24th revealed a small dorsal avulsion fracture of the talus, consistent with a sprain.  She has been experiencing significant discomfort, impacting her ability to work, and she is eager to avoid missing work due to this injury. She wears a size seven and a half shoe and is concerned about maintaining her regular activities without further aggravating her condition.     Assessment & Plan: Visit Diagnoses:  1. Sprain of right ankle, unspecified ligament, initial encounter     Plan: Assessment and Plan Assessment & Plan Right ankle sprain with dorsal talar avulsion fracture Small dorsal avulsion fracture of the talus consistent with a sprain. Radiograph confirms avulsion off the dorsum of the talus. Anterior drawer test stable. - Provided lace-up ankle support for two months. - Advised wearing the brace until symptoms improve, typically around two months. - Ensured brace fits over regular shoe wear.      Follow-Up Instructions: Return if symptoms worsen or fail to improve.   Ortho Exam  Patient is  alert, oriented, no adenopathy, well-dressed, normal affect, normal respiratory effort. Physical Exam CARDIOVASCULAR: Good dorsalis pedis pulse. MUSCULOSKELETAL: Tenderness on palpation of deltoid and lateral ankle ligaments. Tenderness on palpation dorsally over the talus. Anterior drawer test stable, no ligamentous laxity.      Imaging: No results found. No images are attached to the encounter.  Labs: Lab Results  Component Value Date   ESRSEDRATE 5 03/10/2024   REPTSTATUS 06/05/2022 FINAL 05/31/2022   CULT  05/31/2022    NO GROWTH 5 DAYS Performed at [facility], [address] [place], [place] [address]      Lab Results  Component Value Date   ALBUMIN  4.7 03/10/2024   ALBUMIN  4.4 10/06/2023   ALBUMIN  4.9 06/26/2022    Lab Results  Component Value Date   MG 1.9 06/03/2022   MG 1.5 (L) 06/02/2022   MG 2.2 06/01/2022   Lab Results  Component Value Date   VD25OH 51.4 10/06/2023   VD25OH 21.36 (L) 05/05/2022    No results found for: PREALBUMIN    Latest Ref Rng & Units 03/10/2024   10:58 AM 10/06/2023    3:25 PM 06/26/2022    2:38 PM  CBC EXTENDED  WBC 3.4 - 10.8 x10E3/uL 6.6  16.1  7.3   RBC 3.77 - 5.28 x10E6/uL 4.31  4.12  3.94   Hemoglobin 11.1 - 15.9 g/dL 13.3  12.5  12.3   HCT 34.0 - 46.6 % 41.4  37.9  37.1   Platelets 150 - 450 x10E3/uL 406  621  306      There is no height or weight on file to calculate BMI.  Orders:  No orders of the defined types were placed in this encounter.  No orders of the defined types were placed in this encounter.    Procedures: No procedures performed  Clinical Data: No additional findings.  ROS:  All other systems negative, except as noted in the HPI. Review of Systems  Objective: Vital Signs: There were no vitals taken for this visit.  Specialty Comments:  No specialty comments available.  PMFS History: Patient Active Problem List   Diagnosis Date Noted   Acquired external rotation of  foot, right 03/10/2024   Bulging of cervical intervertebral disc 12/02/2023   Thrush, oral 04/08/2023   Screening mammogram for breast cancer 01/26/2023   Screening for cervical cancer 01/26/2023   Major depressive disorder, recurrent episode, moderate with anxious distress (HCC) 10/08/2022   Generalized anxiety disorder 10/08/2022   Chronic systolic heart failure (HCC) 07/03/2022   Hypomagnesemia 05/30/2022   Acute combined systolic and diastolic CHF, NYHA class 1 (HCC) 05/30/2022   Candida esophagitis (HCC)    LFTs abnormal    Loss of weight 05/06/2022   Normal anion gap metabolic acidosis 05/05/2022   DNR (do not resuscitate) 05/05/2022   Adrenal insufficiency 05/05/2022   History of seizures 05/05/2022   Seizure disorder (HCC) 05/04/2022   Bipolar disease, chronic (HCC) 05/04/2022   Hypovolemic shock (HCC) 05/01/2022   Severe dehydration 05/01/2022   Nausea and vomiting 05/01/2022   Disorders of fluid, electrolyte, and acid-base balance 05/01/2022   Hyponatremia 05/01/2022   Hypokalemia 05/01/2022   AKI (acute kidney injury) 05/01/2022   Severe protein-calorie malnutrition 05/01/2022   Bursitis of hip    Past Medical History:  Diagnosis Date   Anxiety    Bipolar 1 disorder (HCC)    Bursitis of hip    Chronic bronchitis    Hip joint pain    Seizures (HCC)    Shock (HCC)    hypovolemic    Family History  Problem Relation Age of Onset   Heart attack Mother    Hypertension Mother    Heart attack Father    Heart attack Maternal Grandfather    Heart attack Paternal Grandfather    Diabetes Other    Cancer Other     Past Surgical History:  Procedure Laterality Date   BIOPSY  05/07/2022   Procedure: BIOPSY;  Surgeon: Leigh Elspeth SQUIBB, MD;  Location: [facility] ENDOSCOPY;  Service: Gastroenterology;;   VASSIE STUDY  06/03/2022   Procedure: BUBBLE STUDY;  Surgeon: Rolan Ezra RAMAN, MD;  Location: [facility] ENDOSCOPY;  Service: Cardiovascular;;   CESAREAN SECTION     2002    ESOPHAGOGASTRODUODENOSCOPY (EGD) WITH PROPOFOL  N/A 05/07/2022   Procedure: ESOPHAGOGASTRODUODENOSCOPY (EGD) WITH PROPOFOL ;  Surgeon: Leigh Elspeth SQUIBB, MD;  Location: [facility] ENDOSCOPY;  Service: Gastroenterology;  Laterality: N/A;   KNEE ARTHROSCOPY     MOUTH SURGERY  2010   RIGHT/LEFT HEART CATH AND CORONARY ANGIOGRAPHY N/A 06/02/2022   Procedure: RIGHT/LEFT HEART CATH AND CORONARY ANGIOGRAPHY;  Surgeon: Rolan Ezra RAMAN, MD;  Location: [facility] INVASIVE CV LAB;  Service: Cardiovascular;  Laterality: N/A;   TEE WITHOUT CARDIOVERSION N/A 06/03/2022   Procedure: TRANSESOPHAGEAL ECHOCARDIOGRAM (TEE);  Surgeon: Rolan Ezra RAMAN, MD;  Location: [facility] ENDOSCOPY;  Service: Cardiovascular;  Laterality: N/A;  Social History   Occupational History   Not on file  Tobacco Use   Smoking status: Former    Current packs/day: 0.50    Types: Cigarettes, E-cigarettes    Start date: 2018   Smokeless tobacco: Never   Tobacco comments:    Former smoker 07/03/22  Vaping Use   Vaping status: Some Days   Substances: Nicotine, Flavoring  Substance and Sexual Activity   Alcohol use: Not Currently    Comment: rarely   Drug use: Not Currently   Sexual activity: Yes    Birth control/protection: None

## 2024-07-05 ENCOUNTER — Other Ambulatory Visit: Payer: Self-pay | Admitting: Nurse Practitioner

## 2024-07-05 DIAGNOSIS — S161XXA Strain of muscle, fascia and tendon at neck level, initial encounter: Secondary | ICD-10-CM

## 2024-07-06 NOTE — Telephone Encounter (Signed)
 Please advise [facility]

## 2024-07-24 ENCOUNTER — Other Ambulatory Visit: Payer: Self-pay | Admitting: Nurse Practitioner

## 2024-07-25 NOTE — Telephone Encounter (Signed)
 Please advise [facility]

## 2024-08-08 ENCOUNTER — Other Ambulatory Visit: Payer: Self-pay | Admitting: Medical Genetics

## 2024-08-08 DIAGNOSIS — Z006 Encounter for examination for normal comparison and control in clinical research program: Secondary | ICD-10-CM

## 2024-08-28 ENCOUNTER — Other Ambulatory Visit (HOSPITAL_COMMUNITY)

## 2024-10-23 ENCOUNTER — Ambulatory Visit: Admitting: Internal Medicine

## 2024-11-02 ENCOUNTER — Ambulatory Visit: Admitting: Diagnostic Neuroimaging
